# Patient Record
Sex: Female | Born: 1935 | Race: White | Hispanic: No | Smoking: Never smoker
Health system: Southern US, Community
[De-identification: ages and names within clinical notes are randomized; demographics above are authoritative.]

## PROBLEM LIST (undated history)

## (undated) DIAGNOSIS — I493 Ventricular premature depolarization: Secondary | ICD-10-CM

## (undated) DIAGNOSIS — C801 Malignant (primary) neoplasm, unspecified: Secondary | ICD-10-CM

## (undated) DIAGNOSIS — M069 Rheumatoid arthritis, unspecified: Secondary | ICD-10-CM

## (undated) DIAGNOSIS — E039 Hypothyroidism, unspecified: Secondary | ICD-10-CM

## (undated) DIAGNOSIS — I10 Essential (primary) hypertension: Secondary | ICD-10-CM

## (undated) DIAGNOSIS — J45909 Unspecified asthma, uncomplicated: Secondary | ICD-10-CM

## (undated) DIAGNOSIS — I119 Hypertensive heart disease without heart failure: Secondary | ICD-10-CM

## (undated) DIAGNOSIS — M109 Gout, unspecified: Secondary | ICD-10-CM

## (undated) DIAGNOSIS — E785 Hyperlipidemia, unspecified: Secondary | ICD-10-CM

## (undated) DIAGNOSIS — I359 Nonrheumatic aortic valve disorder, unspecified: Secondary | ICD-10-CM

## (undated) DIAGNOSIS — I480 Paroxysmal atrial fibrillation: Secondary | ICD-10-CM

## (undated) HISTORY — DX: Hyperlipidemia, unspecified: E78.5

## (undated) HISTORY — DX: Ventricular premature depolarization: I49.3

## (undated) HISTORY — DX: Unspecified asthma, uncomplicated: J45.909

## (undated) HISTORY — DX: Hypertensive heart disease without heart failure: I11.9

## (undated) HISTORY — PX: CATARACT EXTRACTION: SUR2

## (undated) HISTORY — DX: Gout, unspecified: M10.9

## (undated) HISTORY — DX: Hypothyroidism, unspecified: E03.9

## (undated) HISTORY — DX: Paroxysmal atrial fibrillation: I48.0

## (undated) HISTORY — DX: Nonrheumatic aortic valve disorder, unspecified: I35.9

## (undated) HISTORY — DX: Malignant (primary) neoplasm, unspecified: C80.1

## (undated) HISTORY — DX: Rheumatoid arthritis, unspecified: M06.9

## (undated) HISTORY — DX: Essential (primary) hypertension: I10

---

## 1984-01-04 HISTORY — PX: BREAST BIOPSY: SHX20

## 1986-06-17 HISTORY — PX: HYSTEROSCOPY: SHX211

## 1988-01-04 HISTORY — PX: BONY PELVIS SURGERY: SHX572

## 1990-06-04 HISTORY — PX: ELBOW SURGERY: SHX618

## 1998-02-05 ENCOUNTER — Other Ambulatory Visit: Admission: RE | Admit: 1998-02-05 | Discharge: 1998-02-05 | Payer: Self-pay | Admitting: Family Medicine

## 1999-02-15 ENCOUNTER — Other Ambulatory Visit: Admission: RE | Admit: 1999-02-15 | Discharge: 1999-02-15 | Payer: Self-pay | Admitting: Family Medicine

## 2000-11-15 HISTORY — PX: CATARACT EXTRACTION: SUR2

## 2001-03-12 ENCOUNTER — Other Ambulatory Visit: Admission: RE | Admit: 2001-03-12 | Discharge: 2001-03-12 | Payer: Self-pay | Admitting: Family Medicine

## 2003-05-20 ENCOUNTER — Emergency Department (HOSPITAL_COMMUNITY): Admission: EM | Admit: 2003-05-20 | Discharge: 2003-05-21 | Payer: Self-pay | Admitting: *Deleted

## 2003-06-30 ENCOUNTER — Inpatient Hospital Stay (HOSPITAL_COMMUNITY): Admission: RE | Admit: 2003-06-30 | Discharge: 2003-07-04 | Payer: Self-pay | Admitting: Orthopedic Surgery

## 2003-06-30 HISTORY — PX: HIP SURGERY: SHX245

## 2003-07-02 ENCOUNTER — Encounter (INDEPENDENT_AMBULATORY_CARE_PROVIDER_SITE_OTHER): Payer: Self-pay | Admitting: Cardiology

## 2003-07-04 ENCOUNTER — Inpatient Hospital Stay
Admission: RE | Admit: 2003-07-04 | Discharge: 2003-07-14 | Payer: Self-pay | Admitting: Physical Medicine & Rehabilitation

## 2004-04-20 ENCOUNTER — Encounter: Admission: RE | Admit: 2004-04-20 | Discharge: 2004-04-20 | Payer: Self-pay | Admitting: Family Medicine

## 2004-04-27 ENCOUNTER — Ambulatory Visit: Payer: Self-pay | Admitting: Critical Care Medicine

## 2004-06-08 ENCOUNTER — Encounter: Admission: RE | Admit: 2004-06-08 | Discharge: 2004-06-08 | Payer: Self-pay | Admitting: Family Medicine

## 2004-06-10 ENCOUNTER — Other Ambulatory Visit: Admission: RE | Admit: 2004-06-10 | Discharge: 2004-06-10 | Payer: Self-pay | Admitting: Family Medicine

## 2004-12-09 ENCOUNTER — Encounter: Admission: RE | Admit: 2004-12-09 | Discharge: 2004-12-09 | Payer: Self-pay | Admitting: Family Medicine

## 2005-06-28 ENCOUNTER — Encounter: Admission: RE | Admit: 2005-06-28 | Discharge: 2005-06-28 | Payer: Self-pay | Admitting: Family Medicine

## 2005-09-01 ENCOUNTER — Encounter: Admission: RE | Admit: 2005-09-01 | Discharge: 2005-09-01 | Payer: Self-pay | Admitting: Orthopedic Surgery

## 2005-09-27 ENCOUNTER — Encounter: Admission: RE | Admit: 2005-09-27 | Discharge: 2005-09-27 | Payer: Self-pay | Admitting: Orthopedic Surgery

## 2005-10-20 ENCOUNTER — Encounter: Admission: RE | Admit: 2005-10-20 | Discharge: 2005-10-20 | Payer: Self-pay | Admitting: Orthopedic Surgery

## 2007-01-04 HISTORY — PX: CARDIAC SURGERY: SHX584

## 2009-05-28 ENCOUNTER — Inpatient Hospital Stay (HOSPITAL_COMMUNITY): Admission: RE | Admit: 2009-05-28 | Discharge: 2009-05-31 | Payer: Self-pay | Admitting: Orthopedic Surgery

## 2009-05-28 HISTORY — PX: HIP SURGERY: SHX245

## 2010-03-22 LAB — URINALYSIS, ROUTINE W REFLEX MICROSCOPIC
Bilirubin Urine: NEGATIVE
Glucose, UA: NEGATIVE mg/dL
Ketones, ur: NEGATIVE mg/dL
Leukocytes, UA: NEGATIVE
Nitrite: NEGATIVE
Protein, ur: NEGATIVE mg/dL
Specific Gravity, Urine: 1.011 (ref 1.005–1.030)
Urobilinogen, UA: 1 mg/dL (ref 0.0–1.0)
pH: 6 (ref 5.0–8.0)

## 2010-03-22 LAB — URINE MICROSCOPIC-ADD ON

## 2010-03-22 LAB — CBC
HCT: 24.8 % — ABNORMAL LOW (ref 36.0–46.0)
HCT: 28.2 % — ABNORMAL LOW (ref 36.0–46.0)
HCT: 29.1 % — ABNORMAL LOW (ref 36.0–46.0)
HCT: 39 % (ref 36.0–46.0)
Hemoglobin: 13.1 g/dL (ref 12.0–15.0)
Hemoglobin: 8.4 g/dL — ABNORMAL LOW (ref 12.0–15.0)
Hemoglobin: 9.4 g/dL — ABNORMAL LOW (ref 12.0–15.0)
Hemoglobin: 9.7 g/dL — ABNORMAL LOW (ref 12.0–15.0)
MCHC: 33.4 g/dL (ref 30.0–36.0)
MCHC: 33.5 g/dL (ref 30.0–36.0)
MCHC: 33.6 g/dL (ref 30.0–36.0)
MCHC: 33.7 g/dL (ref 30.0–36.0)
MCV: 93.9 fL (ref 78.0–100.0)
MCV: 94.5 fL (ref 78.0–100.0)
MCV: 96.1 fL (ref 78.0–100.0)
MCV: 96.3 fL (ref 78.0–100.0)
Platelets: 164 10*3/uL (ref 150–400)
Platelets: 172 10*3/uL (ref 150–400)
Platelets: 194 10*3/uL (ref 150–400)
Platelets: 284 10*3/uL (ref 150–400)
RBC: 2.58 MIL/uL — ABNORMAL LOW (ref 3.87–5.11)
RBC: 3 MIL/uL — ABNORMAL LOW (ref 3.87–5.11)
RBC: 3.08 MIL/uL — ABNORMAL LOW (ref 3.87–5.11)
RBC: 4.06 MIL/uL (ref 3.87–5.11)
RDW: 14.1 % (ref 11.5–15.5)
RDW: 14.2 % (ref 11.5–15.5)
RDW: 14.9 % (ref 11.5–15.5)
RDW: 15.2 % (ref 11.5–15.5)
WBC: 5.9 10*3/uL (ref 4.0–10.5)
WBC: 6.1 10*3/uL (ref 4.0–10.5)
WBC: 7.7 10*3/uL (ref 4.0–10.5)
WBC: 8.3 10*3/uL (ref 4.0–10.5)

## 2010-03-22 LAB — COMPREHENSIVE METABOLIC PANEL
ALT: 20 U/L (ref 0–35)
AST: 26 U/L (ref 0–37)
Albumin: 4 g/dL (ref 3.5–5.2)
Alkaline Phosphatase: 48 U/L (ref 39–117)
BUN: 12 mg/dL (ref 6–23)
CO2: 28 mEq/L (ref 19–32)
Calcium: 9.9 mg/dL (ref 8.4–10.5)
Chloride: 101 mEq/L (ref 96–112)
Creatinine, Ser: 1.08 mg/dL (ref 0.4–1.2)
GFR calc Af Amer: 59 mL/min — ABNORMAL LOW (ref >60)
GFR calc non Af Amer: 49 mL/min — ABNORMAL LOW (ref >60)
Glucose, Bld: 106 mg/dL — ABNORMAL HIGH (ref 70–99)
Potassium: 4.2 mEq/L (ref 3.5–5.1)
Sodium: 136 mEq/L (ref 135–145)
Total Bilirubin: 0.5 mg/dL (ref 0.3–1.2)
Total Protein: 6.9 g/dL (ref 6.0–8.3)

## 2010-03-22 LAB — BASIC METABOLIC PANEL
BUN: 12 mg/dL (ref 6–23)
BUN: 13 mg/dL (ref 6–23)
CO2: 26 mEq/L (ref 19–32)
CO2: 29 mEq/L (ref 19–32)
Calcium: 8.3 mg/dL — ABNORMAL LOW (ref 8.4–10.5)
Calcium: 8.5 mg/dL (ref 8.4–10.5)
Chloride: 102 mEq/L (ref 96–112)
Chloride: 103 mEq/L (ref 96–112)
Creatinine, Ser: 0.75 mg/dL (ref 0.4–1.2)
Creatinine, Ser: 0.81 mg/dL (ref 0.4–1.2)
GFR calc Af Amer: >60 mL/min (ref >60)
GFR calc Af Amer: >60 mL/min (ref >60)
GFR calc non Af Amer: >60 mL/min (ref >60)
GFR calc non Af Amer: >60 mL/min (ref >60)
Glucose, Bld: 122 mg/dL — ABNORMAL HIGH (ref 70–99)
Glucose, Bld: 142 mg/dL — ABNORMAL HIGH (ref 70–99)
Potassium: 3.5 mEq/L (ref 3.5–5.1)
Potassium: 4.2 mEq/L (ref 3.5–5.1)
Sodium: 134 mEq/L — ABNORMAL LOW (ref 135–145)
Sodium: 135 mEq/L (ref 135–145)

## 2010-03-22 LAB — CROSSMATCH
ABO/RH(D): A POS
Antibody Screen: NEGATIVE

## 2010-03-22 LAB — DIFFERENTIAL
Basophils Absolute: 0 10*3/uL (ref 0.0–0.1)
Basophils Relative: 1 % (ref 0–1)
Eosinophils Absolute: 0.1 10*3/uL (ref 0.0–0.7)
Eosinophils Relative: 2 % (ref 0–5)
Lymphocytes Relative: 35 % (ref 12–46)
Lymphs Abs: 2.1 10*3/uL (ref 0.7–4.0)
Monocytes Absolute: 0.4 10*3/uL (ref 0.1–1.0)
Monocytes Relative: 6 % (ref 3–12)
Neutro Abs: 3.4 10*3/uL (ref 1.7–7.7)
Neutrophils Relative %: 56 % (ref 43–77)

## 2010-03-22 LAB — PROTIME-INR
INR: 0.97 (ref 0.00–1.49)
Prothrombin Time: 12.8 seconds (ref 11.6–15.2)

## 2010-03-22 LAB — APTT: aPTT: 29 seconds (ref 24–37)

## 2010-03-22 LAB — PREPARE RBC (CROSSMATCH)

## 2010-03-22 LAB — ABO/RH: ABO/RH(D): A POS

## 2010-05-21 NOTE — Consult Note (Signed)
NAME:  Jessica Kaufman, Jessica Kaufman NO.:  [id number]   MEDICAL RECORD NO.:  [id number]                   PATIENT TYPE:  INP   LOCATION:  6705                                 FACILITY:  [facility]   PHYSICIAN:  W. Ashley Royalty., M.D.         DATE OF BIRTH:  1925/12/11   DATE OF CONSULTATION:  07/02/2003  DATE OF DISCHARGE:                                   CONSULTATION   REASON FOR CONSULTATION:  Thank you for asking me to see this 77 year old  female for evaluation of a cardiac murmur.  She has a previous history of  hypertension and some mild edema and has been on Toprol-XL and  hydrochlorothiazide previously.  She had atypical chest pain and had a  Cardiolite stress test done in July 2002 that showed no ischemia but had  some atrial fibrillation with exercise.  She competes in the [employer] and has no angina, PND, orthopnea, syncope, claudication.  She does  have some mild palpitations.  She was admitted for a hip replacement.  A  cardiac murmur was noted by the physician's assistant and I was asked to  evaluate this.  She has no history of rheumatic fever or syncope.   PAST MEDICAL HISTORY:  Remarkable for:  1. Hypertension.  2. Gastroesophageal reflux.  3. History of phlebitis.   PAST SURGICAL HISTORY:  1. Bladder suspension and vaginal hysterectomy.  2. Bilateral cataract extraction.  3. Right elbow dislocation, closure and reduction of foreign bodies in 1992.  4. Hysteroscopy in 1988.  5. Left breast biopsy in 1986.  6. Hip replacement.   SOCIAL HISTORY:  She is currently a widow and has seven children.  Six are  currently living.  She lives alone, is a non-smoker, does not use alcohol to  excess.   FAMILY HISTORY:  Mother died at age 69 with osteoarthritis, coronary artery  disease, and hypertension.  Father died at age 7 with an MI.  A brother  died in infancy.  Two brothers, 5 and 62.  Two sisters, 88 and 66.   REVIEW OF SYSTEMS:   She has had some crown work and wears glasses for  reading.  She has occasional tinnitus.  She has complained of a skin rash  thought due to Sulindac. She has a history of reflux and also has some  stress urgency and some mild incontinence.   PHYSICAL EXAMINATION:  GENERAL:  She is a pleasant female who is mildly  overweight.  No acute distress.  VITAL SIGNS:  Blood pressure currently 150/80, pulse 76.  SKIN:  Warm and dry.  HEENT:  __________ clear.  NECK:  Supple without masses.  No JVD, no thyromegaly or bruits.  LUNGS:  Clear.  CARDIAC:  Normal S1 and S2.  There is a 2/6 systolic murmur at the right  sternal border at the second intercostal space, radiating down the left  sternal border, slight radiation into the carotids.  ABDOMEN:  Soft and nontender.  Femoral and distal pulses are 2+.  There was  no edema noted.   LABORATORY DATA:  A 12-lead EKG shows some nonspecific changes in the  lateral leads, interventricular conduction delay. Hemoglobin 13.3,  hematocrit 39 on admission. Hemoglobin yesterday was 8.2 and hematocrit of  23.7 prior to transfusion.  Slight elevation of serum glucose.  Liver  enzymes were normal.  Echocardiogram was reviewed and was technically very  difficult.  There appeared to be slight thickening of the aortic valve with  very mild aortic sclerosis or stenosis.  The left ventricle was not well  seen.  There may have been slight increase in septal thickness.  There may  have been a gradient across the outflow tract but this was very difficult to  determine off the echo and I do not trust the Doppler measurements done by  the technician that did it.   IMPRESSION:  1. Cardiac murmur which is likely due to aortic valve disease which is     asymptomatic.  2. History of hypertension.  3. History of atrial fibrillation which was exercise induced.   RECOMMENDATIONS:  1. Use SBE prophylaxis for dental work, GI or GU procedures.  2. Beta blocker for treatment  of atrial fibrillation which is exercise     induced.  She should continue on this.  3. No further work-up other than getting periodic echos maybe every two to     three years as needed for the cardiac murmur.  She may involved in     unlimited activity.   Thank you for asking me to see this nice woman with you.                                               Darden Palmer., M.D.    WST/MEDQ  D:  07/02/2003  T:  07/02/2003  Job:  [id number]   cc:   Lianne Bushy, M.D.  [address]  [place]  [place] [address]  Fax: [phone]   Carlisle Beers. Rendall III, M.D.  [address] [place]  [place] [address]  Fax: [phone]

## 2010-05-21 NOTE — Discharge Summary (Signed)
NAME:  Jessica Kaufman, Jessica Kaufman                      ACCOUNT NO.:  [id number]   MEDICAL RECORD NO.:  [id number]                   Kaufman TYPE:  ORB   LOCATION:  4531                                 FACILITY:  [facility]   PHYSICIAN:  Ellwood Dense, M.D.                DATE OF BIRTH:  August 23, 1925   DATE OF ADMISSION:  07/04/2003  DATE OF DISCHARGE:  07/14/2003                                 DISCHARGE SUMMARY   DISCHARGE DIAGNOSES:  1. Right total hip replacement.  2. Postoperative anemia.  3. Hypertension.  4. Upper respiratory infection.   HISTORY OF PRESENT ILLNESS:  Jessica Kaufman is a 77 year old female with  problems with ambulation and pain radiating to Jessica knee.  Jessica Kaufman elected to  undergo right total hip replacement on June 27, by Dr. Priscille Kluver.  Postoperatively, Jessica Kaufman is weightbearing as tolerated and on Arixtra for DVT  prophylaxis.  Jessica Kaufman was noted to have a heart murmur noted on postop check.  It was felt Jessica Kaufman suffered from aortic valve disease.  Jessica Kaufman was to  continue on Toprol.  Jessica Kaufman has received two units of packed red blood  cells secondary to drop in H&H at 10.6 and 21.9.  Therapies were initiated  and Jessica Kaufman had been progressing along well.  Jessica Kaufman is currently close  supervision for transfers, close supervision for ambulating 50 feet with  rolling walker, moderate assist for dressing, and total assist for low body  dressing.   PAST MEDICAL HISTORY:  1. GERD.  2. Hysterectomy.  3. ORIF.  4. Occasional palpitations.  5. Atrial fibrillation, exercised induced.  6. Cardiac murmur.   ALLERGIES:  FLAGYL, NAPROSYN and SULINDAC.   SOCIAL HISTORY:  Jessica Kaufman lives alone in a split-level home.  No steps at  entry.  There are 8-9 steps inside.  Jessica Kaufman was independent prior to admission.  Jessica Kaufman does not use any tobacco or alcohol.   HOSPITAL COURSE:  Jessica Kaufman was admitted to [facility] on July 04, 2003, for  therapy to consider PT/OT daily.  Past admission, Jessica Kaufman was maintained  on iron  supplement for her postoperative anemia.  Recheck CBC showed hemoglobin  10.1, hematocrit 28.8, white count 6.2, platelets 99.  Check of electrolytes  showed sodium 132, potassium 3.8, chloride 100, CO2 79, creatinine 0.7,  glucose 97.  Jessica Kaufman's hip incision was noted to be healing well without  any signs or symptoms of infection or hematuria noted.  Arixtra was  continued for 9 days of total therapy.  Jessica Kaufman did have some dysuria with  episode of hematuria and UA C&S was sent off showing positive nitrites, 21-  50 wbc's and too numerous to count bacteria.  Urine culture grew out E. coli  that was pansensitive.  Jessica Kaufman was started on amoxicillin to continue  this for seven total days of antibiotic therapy.  Jessica Kaufman's blood  pressure has been well-controlled during this stay.  Jessica Kaufman  has been  participating well with therapies.  During her stay in [facility], Jessica Kaufman  progressed to being at assist for ADLs, modified independent for toileting,  modified independent for transfers, modified independent for ambulating 200  feet with rolling walker, requires minimal assist to navigate stairs.  Further followup therapies to include home health PT/OT by [facility].  On July 14, 2003, Jessica Kaufman is discharged to home.   DISCHARGE MEDICATIONS:  1. Trinsicon one p.o. b.i.d.  2. Amoxicillin 250 mg b.i.d.  3. Vicodin 1-2 q.4-6h. p.r.n. pain.  4. Os-Cal plus.  5. Protonix 40 mg a day.  6. Hydrochlorothiazide 25 mg a day.  7. Toprol XL 25 mg a day.  8. Senokot 50 mg p.o. b.i.d.   ACTIVITY:  Use walker.  Follow right total hip precautions.  No driving.   WOUND CARE:  Keep area clean and dry.  May wash with soap and water.   FOLLOW UP:  Jessica Kaufman is to follow up with Dr. Priscille Kluver for an appointment.  Follow up with Dr. Thomasena Edis as needed.      Greg Cutter, P.A.                    Ellwood Dense, M.D.    PP/MEDQ  D:  07/16/2003  T:  07/17/2003  Job:   [id number]   cc:   Lianne Bushy, M.D.  [address]  [place]  [place] [address]  Fax: [phone]   Carlisle Beers. Rendall III, M.D.  [address] [place]  [place] [address]  Fax: [phone]

## 2010-05-21 NOTE — H&P (Signed)
NAME:  Jessica Kaufman, Jessica Kaufman                      ACCOUNT NO.:  [id number]   MEDICAL RECORD NO.:  [id number]                   PATIENT TYPE:  INP   LOCATION:                                       FACILITY:  [facility]   PHYSICIAN:  John L. Rendall, M.D.               DATE OF BIRTH:  May 14, 1925   DATE OF ADMISSION:  06/30/2003  DATE OF DISCHARGE:                                HISTORY & PHYSICAL   CHIEF COMPLAINT:  Right hip pain for the last year and a half to two years.   HISTORY OF PRESENT ILLNESS:  This 77 year old white female patient presented  to Dr. Priscille Kluver with a one and a half to two year history of gradual onset  and pretty constant right hip pain.  She has had no known injury or prior  surgery to the hip, and the pain is about the same as it was when it  started.  The pain is intermittent, just kind of an uncomfortable sensation  in the right groin and buttock area with some radiation now into her back  and occasionally down into her knee.  She is having greater difficulty with  ambulation and range of motion, and the pain increases with any walking and  decreases with rest.  There is no night pain or paresthesias associated with  the pain, but the hip does feel like it catches at times.  There is no  popping, grinding, locking, or giving way.  She does have difficulty putting  her socks and shoes on.  She is currently taking Arthritis Tylenol for pain  and that provides a moderate amount of relief.  She does ambulate with the  use of a cane as needed.   ALLERGIES:  1. METRONIDAZOLE/FLAGYL - a rash.  2. NAPROSYN - nausea, and vomiting, and sick feeling.  3. SULINDAC - rash.   CURRENT MEDICATIONS:  1. Toprol 50 mg one tablet p.o. q.a.m.  2. Hydrochlorothiazide 25 mg half a tablet p.o. q.a.m.  3. Detrol LA 4 mg one tablet p.o. q.a.m.  4. Aciphex 20 mg one tablet p.o. every day p.r.n. reflux.  5. Allegra 180 mg one tablet p.o. q.a.m.  6. Metamucil 1 teaspoon p.o. q.p.m.  7.  Calcium plus vitamin D 500 mg one tablet p.o. t.i.d.  8. Centrum Silver one tablet p.o. q.a.m.  9. Chondroitin and glucosamine 400/500 mg one tablet p.o. b.i.d.  10.      Tylenol Arthritis two tablets p.o. t.i.d. p.r.n. pain.   PAST MEDICAL HISTORY:  1. Hypertension, diagnosed approximately a year ago.  She had been on Toprol     and hydrochlorothiazide but quit taking after urinary tract infection in     May when her blood pressure was low.  She says she has been monitoring     it, and it has been fairly low with the highest reading that she     remembers right now  146/70 something.  2. Occasional reflux treated with Aciphex.  3. History of cardiac stress test about a year ago, and she said she had an     occasional skipped beat but it was otherwise negative.  4. History of phlebitis after a pregnancy.  She denies any history of diabetes mellitus, thyroid disease, hiatal hernia,  peptic ulcer disease, heart disease, asthma, or any other chronic medical  condition other than noted previously.   PAST SURGICAL HISTORY:  1. Left breast biopsy, by Dr. Currie Paris, in 1986.  2. Hysteroscopy, by Dr. Leatha Gilding. Mezer, in 1988.  3. Right elbow dislocation, closed reduction and removal of loose bodies, by     Dr. Jonny Ruiz L. Rendall, 1992.  4. Repair of bladder and a vaginal hysterectomy, by Dr. Chevis Pretty, in 1997.  5. Removal bilateral cataracts, by Dr. Carin Hock. Epes, in 2002.  She denies any complications from the above mentioned procedures.   SOCIAL HISTORY:  She denies any history of cigarette smoking, alcohol use,  or drug use.  She is a widow and had seven children.  She has six living at  this time.  She did have two miscarriages.  She lives by herself in a split-  level house with no steps into the main entrance.  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   Her medical doctor is Dr. Lianne Bushy in [place], [place], and his  phone number is [phone].    FAMILY HISTORY:  Mother died at the age of 11 with osteoarthritis, coronary  artery disease, what sounds like an epidural hematoma, and hypertension.  Her father died at the age of 69 with a myocardial infarction.  She had one  brother who died at one day old, and then she has two living brothers ages  72 and 10, and two living sisters ages 48 and 29.  Her brothers are all  healthy.  Her older sister has a history of melanoma.  Younger sister a  history of coronary artery disease.  She did have three daughters and four  sons, and they range in age from age 25-55.  She did have one son who died  at age 35 with a tractor accident.   REVIEW OF SYSTEMS:  She does wear glasses mainly for reading.  She is to  have a crown finished on Thursday, it broke last week.  She does have  occasional ringing in her ears.  For the last three weeks, she has had a  problem with either a cough or allergies where she has a minimally  productive cough and some drainage.  She did have a very sore mouth about 2-  3 weeks ago and that is now resolved.  She did have a urinary tract  infection, May 20, 2003, which was treated with Cipro.  She does have a  Living Will and  her power-of-attorney is Juliette Alcide and Tonette Bihari.  All other systems are negative and noncontributory.   PHYSICAL EXAMINATION:  GENERAL:  A well-developed, well-nourished, mildly  overweight, white female, no acute distress.  Mood and affect are  appropriate.  Talks easily with examiner.  Does walk with a significant limp  and an antalgic gait with the use of a cane.  Height 5' 6, weight 180  pounds, BMI is 28.  VITAL SIGNS:  Temp 96.8 degrees Fahrenheit, pulse 68, respirations 14, and  BP 198/92.  HEENT:  Normocephalic, atraumatic without frontal or maxillary sinus  tenderness  to palpation.  Conjunctivae pink.  Sclerae anicteric.  PERRLA.  EOMs intact.  No visible external ear deformities.  Hearing grossly intact. Tympanic membranes pearly  gray bilaterally with good light reflex, except  unable to visualize the left ear tympanic membrane due to a large amount of  cerumen in the ear canal.  Nose and nasal septum midline.  Nasal mucosa pink  and moist without exudates or polyps noted.  Buccal mucosa pink and moist.  Good dentition.  Pharynx without erythema or exudates.  Tongue and the uvula  midline.  Tongue without fasciculations and the uvula rises equally with  phonation.  NECK:  No visible masses or lesions noted.  Trachea midline.  No palpable  lymphadenopathy, nor thyromegaly.  Carotids +2 bilaterally without bruits.  Full range of motion and nontender to palpation along the cervical spine.  CARDIOVASCULAR:  Heart rate and rhythm regular.  S1 S2 present with a grade  2/6 systolic murmur heard best at the right sternal border second  intercostal space.  No rubs, clicks, or gallops noted.  RESPIRATORY:  Respirations even and unlabored.  Breath sounds clear to  auscultation bilaterally without rales or wheezes noted.  ABDOMEN:  Rounded abdominal contour.  Bowel sounds present x 4 quadrants.  Soft, nontender to palpation without hepatosplenomegaly, nor CVA tenderness.  Unable to palpate femoral pulses at this time.  Nontender to palpation along  the entire length of the vertebral column.  BREAST/GU/RECTAL/PELVIC:  These exams deferred at this time.  MUSCULOSKELETAL:  No obvious deformities bilateral upper extremities with  full range of motion of these extremities without pain.  Radial pulses +2  bilaterally.  She has full range of motion of her knees, ankles, and toes  bilaterally.  DP and PT pulses are +2.  No lower extremity edema.  Full  range of motion of her knees without pain or crepitus.  Left hip has full  extension and flexion about 100 degrees.  She does have decreased internal  rotation of the left hip but external rotation appears to be fairly full.  No real pain with range of motion of that left hip and no  pain with  palpation about the left hip.  Right hip has full extension and flexion to  90 degrees and absolutely no internal/external rotation.  Any attempts at  these movements do cause pain.  No pain with palpation about the hip at this  time.  NEUROLOGIC:  Alert and oriented  x 3.  Cranial nerves II-XII are grossly  intact.  Strength 5/5 bilateral upper and lower extremities.  Rapid  alternating movements intact.  Deep tendon reflexes 2+ bilateral upper and  lower extremities.  Sensation intact to light touch.   RADIOLOGIC FINDINGS:  X-rays taken, in May 2005, of both hips show end stage  osteoarthritis on the right hip with total bone against bone contact.  The  left side, there appears to be almost bone against bone contact and end  stage osteoarthritis.   IMPRESSION:  1. End stage osteoarthritis, bilateral hips, right worse than left.  2. Hypertension.  3. Mild gastroesophageal reflux disease.  4. History of phlebitis. 5. History of urinary tract infection, May 2005.   PLAN:  Ms. Fenter will be admitted to [facility], on June 30, 2003, where she will undergo a right total hip arthroplasty by Dr. Jonny Ruiz L.  Rendall.  She will undergo all the routine preoperative laboratory tests and  studies prior to this procedure.  If we  have any medical issues while she is  hospitalized, we will consult one of the hospitalists.  She will notify Dr.  Purnell Shoemaker today of her elevated blood pressure, and I have encouraged her to  resume her hydrochlorothiazide and Toprol which she had stopped taking after  the urinary tract infection.  She will resume this and notify him.      Legrand Pitts Duffy, P.A.                      John L. Priscille Kluver, M.D.    KED/MEDQ  D:  06/24/2003  T:  06/25/2003  Job:  [id number]

## 2010-05-21 NOTE — Op Note (Signed)
NAME:  Jessica Kaufman, Jessica Kaufman                      ACCOUNT NO.:  [id number]   MEDICAL RECORD NO.:  [id number]                   PATIENT TYPE:  INP   LOCATION:  2872                                 FACILITY:  [facility]   PHYSICIAN:  John L. Rendall III, M.D.           DATE OF BIRTH:  07/12/1925   DATE OF PROCEDURE:  06/30/2003  DATE OF DISCHARGE:                                 OPERATIVE REPORT   PREOPERATIVE DIAGNOSIS:  Osteoarthritis, right hip.   SURGICAL PROCEDURE:  Right AML total hip replacement.   POSTOPERATIVE DIAGNOSIS:  Osteoarthritis, right hip.   SURGEON:  John L. Rendall, M.D.   ASSISTANT:  Richardean Canal, P.A.   ANESTHESIA:  General.   PATHOLOGY:  The patient has bone-on-bone right hip and is resistant to  conservative measures of controlling pain.   JUSTIFICATION FOR INPATIENT SETTING:  Observation and pain relief and  therapy.   PROCEDURE:  Under general anesthesia, the patient is placed in the left  lateral decubitus position and the right leg is prepared with Duraprep and  draped as a sterile field.  A posterior approach is made with relatively  small incision, approximately six inches.  Dissection is carried through  skin and subcutaneous tissue.  The IT band is split in the line of its  fibers.  Small Charnley retractor is inserted.  Short external rotators and  hip capsule are taken down.  The hip capsule is opened in a T-shaped manner,  multiple small vessels are cauterized.  The hip is dislocated.  The superior  femoral neck is exposed.  IM initiator and canal finder are used.  Progressive reaming of the canal is then done to 16 mm, which gives good  chatter fit.  Femoral neck is then osteotomized.  The canal is then  appropriately rasped, a 13.5 small, 13.5 standard, 15 standard, and 16.5  standard.  Excellent canal fill and fill of the proximal triangle is  obtained.  Attention is then turned to the acetabulum.  There is some  calcification along the  acetabular rim.  Two cobras are inserted inferiorly,  two wing retractors superiorly.  Care is taken in insertion of these to  manually move soft tissues, including the sciatic nerve, out of the way.  Once this is completed and acetabular exposure is excellent, the ligamentum  teres is removed, the labrum is excised.  The acetabulum is then selectively  reamed up to 59 mm.  A 60 100 Series cup is then inserted.  A trial poly for  a 36 mm hip ball is then inserted.  Excellent fit, alignment, and stability  are obtained, trialling off a rasp.  The best neck is the high-offset as she  is a fairly large woman.  Once this is done, permanent components are  obtained, an apex hole eliminator is inserted.  The Pinnacle Marathon liner  is inserted, a 36 mm inside diameter, 60 mm outside diameter, a +4 with 10  degrees off set.  The high-offset stem 16.5 large is then inserted and the  36 mm femoral head, neck length +1.5.  Excellent fit, alignment, and  stability were obtained through normal range of motion.  Only with marked  internal rotation would this hip dislocate.  The hip capsule is then  repaired with #1 Tycron.  The short external rotators do not really hold a  stitch very well and are left.  The IT band is then closed with #1 Tycron,  subcu with #1 and 2-0 Vicryl, and the skin with clips.  Operative time  approximately 1 hour.  Blood loss less than 300 mL.  Patient tolerated the  procedure well and returned to recovery in good condition.                                               John L. Dorothyann Gibbs, M.D.    Renato Gails  D:  06/30/2003  T:  06/30/2003  Job:  [id number]

## 2010-05-21 NOTE — Discharge Summary (Signed)
NAME:  Jessica Kaufman, Jessica Kaufman                      ACCOUNT NO.:  [id number]   MEDICAL RECORD NO.:  [id number]                   PATIENT TYPE:  INP   LOCATION:  6705                                 FACILITY:  [facility]   PHYSICIAN:  Legrand Pitts. Duffy, P.A.                DATE OF BIRTH:  03-Dec-1925   DATE OF ADMISSION:  06/30/2003  DATE OF DISCHARGE:  07/04/2003                                 DISCHARGE SUMMARY   ADMISSION DIAGNOSIS:  1. End stage osteoarthritis bilateral hips, right worse than left.  2. Hypertension.  3. Mild gastroesophageal reflux disease.  4. History of phlebitis.  5. History of urinary tract infection in May 2005.   DISCHARGE DIAGNOSIS:  1. End stage osteoarthritis bilateral hips status post right total hip     arthroplasty.  2. Acute blood loss anemia secondary to surgery.  3. Aortic valve disease.  4. Hypertension.  5. Mild gastroesophageal reflux disease.  6. History of phlebitis.  7. History of urinary tract infection.   SURGICAL PROCEDURE:  On June 30, 2003, Jessica Kaufman underwent a right total  hip arthroplasty by Dr. Jonny Ruiz L. Rendall assisted by Richardean Canal, P.A.-C.  She had a Pinnacle 100 series cup size 60 mm with a Pinnacle Marathon  acetabular lining, +4 10 degree 60 mm outer diameter, 36 mm inner diameter,  an apex hole eliminator, and then an AML large stature high offset 160 mm  length 16.5 size with 52 mm offset femoral stem placed with an articulating  metal on metal femoral head, 36 mm, +1.5 neck, and 12 14 cone.   COMPLICATIONS:  None.   CONSULTATION:  1. Case management, physical therapy consult, rehab medicine consult, July 01, 2003.  2. Cardiology consult by Dr. Viann Fish July 02, 2003.  3. Occupational therapy consult July 03, 2003.   HISTORY OF PRESENT ILLNESS:  This 77 year old white female patient presented  to Dr. Priscille Kluver with a 1 1/2 to 2 year history of gradual onset and constant  right hip pain.  No known injury of the hip  and the pain is the same as when  it started.  It is intermittent, uncomfortable situation in the right groin  and buttock, with radiation into her back and into her knee.  She has  increased difficulty with ambulation and range of motion and arthritis  Tylenol provides some relief of her pain.  She has failed conservative  treatment and because of that, she is presenting for a right hip  replacement.   HOSPITAL COURSE:  Jessica Kaufman tolerated her surgical procedure well without  immediate postoperative complications.  She was subsequently transferred to  5000.  On postop day one, T-max 99.5 with vital signs stable.  Hemoglobin  8.3, hematocrit 23.7.  Right leg was neurovascularly intact and dressing  intact without drainage.  She was asymptomatic at 8.3 and blood count was  monitored.  She was started  on therapy per protocol.   On postoperative day two, hemoglobin dropped to 7.6 with hematocrit 21.9.  She was noted to have a grade 3/6 systolic murmur throughout.  Cardiology  consult was ordered and she was transfused with 2 units of packed red blood  cells.  She was continued on therapy.  Cardiology saw her and did do a 2-D  echocardiogram.  They felt she had aortic valve disease which was  asymptomatic and recommended SBE prophylaxis for dental work, GI, or GU  procedures.  They also recommended to continue her cardiac workup and felt  no further workup was needed other than periodic echoes every 2-3 years.   Postop day three, she was feeling better.  Hemoglobin had improved to 8.8  with hematocrit 25.3.  She was still continued on therapy.  On July 1, she  remained afebrile.  A bed became available on the subacute unit and she was  subsequently transferred there.   DISCHARGE INSTRUCTIONS:  Diet:  Continue her current hospitalization diet  with adjustments to be made per the rehab physicians.  Medications:  Continue her current hospital meds with adjustments to be made  per the  rehab physicians.  Activity:  She can be out of bed, weight-bearing as tolerated on the right  leg with the use of the walker.  She is to continue PT and OT per rehab  protocols.  Wound care:  Please clean the right hip incision with Betadine daily and  apply a dry dressings.  Staples can be removed with Steri-Strips with  Benzoin applied on postop day 14.  Please notify Dr. Priscille Kluver of temperature  greater than 101.5, chills, pain unrelieved by pain meds, or foul smelling  drainage from the wound.  Follow up:  She needs to follow up with Dr. Priscille Kluver in our office about two  weeks after discharge from the subacute unit.   LABORATORY DATA:  July 01, 2003, hemoglobin 8.3, hematocrit 23.7.  On the  29th, hemoglobin 7.6, hematocrit 21.9.  On June 30, white count 7.8,  hemoglobin 8.8, hematocrit 25.3, and platelets 194.  On June 21, glucose  104.  On June 28, sodium 132, potassium 3.6, glucose 130.  On June 29,  sodium 136, potassium 3.2, glucose 126.  All her laboratory studies were  within normal limits.                                                Legrand Pitts Duffy, P.A.    KED/MEDQ  D:  07/30/2003  T:  07/30/2003  Job:  [id number]   cc:   Lianne Bushy, M.D.  [address]  [place]  [place] [address]  Fax: [phone]   W. Ashley Kaufman., M.D.  [address], [address]  [place]  [place] [address]  Fax: [phone]

## 2010-10-19 ENCOUNTER — Ambulatory Visit (HOSPITAL_BASED_OUTPATIENT_CLINIC_OR_DEPARTMENT_OTHER)
Admission: RE | Admit: 2010-10-19 | Discharge: 2010-10-19 | Disposition: A | Discharge status: Home / Self Care | Payer: Medicare Other | Source: Ambulatory Visit | Attending: Urology | Admitting: Urology

## 2010-10-19 DIAGNOSIS — I10 Essential (primary) hypertension: Secondary | ICD-10-CM | POA: Insufficient documentation

## 2010-10-19 DIAGNOSIS — K219 Gastro-esophageal reflux disease without esophagitis: Secondary | ICD-10-CM | POA: Insufficient documentation

## 2010-10-19 DIAGNOSIS — E039 Hypothyroidism, unspecified: Secondary | ICD-10-CM | POA: Insufficient documentation

## 2010-10-19 DIAGNOSIS — E7800 Pure hypercholesterolemia, unspecified: Secondary | ICD-10-CM | POA: Insufficient documentation

## 2010-10-19 DIAGNOSIS — N393 Stress incontinence (female) (male): Secondary | ICD-10-CM | POA: Insufficient documentation

## 2010-10-19 DIAGNOSIS — Z79899 Other long term (current) drug therapy: Secondary | ICD-10-CM | POA: Insufficient documentation

## 2010-10-19 DIAGNOSIS — Z86718 Personal history of other venous thrombosis and embolism: Secondary | ICD-10-CM | POA: Insufficient documentation

## 2010-10-19 DIAGNOSIS — J45909 Unspecified asthma, uncomplicated: Secondary | ICD-10-CM | POA: Insufficient documentation

## 2010-10-19 DIAGNOSIS — Z7982 Long term (current) use of aspirin: Secondary | ICD-10-CM | POA: Insufficient documentation

## 2010-10-19 HISTORY — PX: BLADDER SURGERY: SHX569

## 2010-10-19 LAB — POCT I-STAT 4, (NA,K, GLUC, HGB,HCT)
Glucose, Bld: 106 mg/dL — ABNORMAL HIGH (ref 70–99)
HCT: 39 % (ref 36.0–46.0)
Hemoglobin: 13.3 g/dL (ref 12.0–15.0)
Potassium: 3.8 mEq/L (ref 3.5–5.1)
Sodium: 139 mEq/L (ref 135–145)

## 2010-10-21 NOTE — Op Note (Signed)
NAME:  Jessica Kaufman, Jessica Kaufman            ACCOUNT NO.:  [id number]  MEDICAL RECORD NO.:  [id number]  LOCATION:                                 FACILITY:  PHYSICIAN:  Excell Seltzer. Annabell Howells, M.D.    DATE OF BIRTH:  05/29/1925  DATE OF PROCEDURE:  10/19/2010 DATE OF DISCHARGE:                              OPERATIVE REPORT   The patient of Dr. Bjorn Pippin.  PROCEDURE:  SPARC sling.  SURGEON:  Excell Seltzer. Annabell Howells, M.D.  ANESTHESIA:  General.  SPECIMEN:  None.  DRAINS:  Foley catheter and vaginal pack.  BLOOD LOSS:  20 mL.  COMPLICATIONS:  None.  INDICATIONS:  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  FINDINGS OF PROCEDURE:  She was given Cipro.  She was taken to operating room where general anesthetic was induced.  She was fitted with PAS hose and placed in lithotomy position.  Her mons was clipped.  She was prepped with Betadine solution and draped in the usual sterile fashion.  Time-out was performed.  A weighted vaginal retractor was placed.  A 16- French Foley catheter was inserted.  The bladder was drained.  The catheter was plugged . The anterior vaginal wall was infiltrated with 10 mL of 1% lidocaine with epinephrine.  A midline anterior vaginal wall incision was made over the mid urethral level.  Mucosa was elevated off the pubourethral fascia sufficient to allow passage of the tip of the index finger.  Once the vaginal dissection was performed, small incisions were made just superior to the pubis approximately 2 cm lateral to the midline on each side.  The fat was spread to the top of the pubis with a hemostat.  The [facility] trocar was passed on the left. The tip of the trocar was taken down to the top of the pubis, it was while pulling back the pubis and then advanced into the vaginal incision under finger guidance.  This was then repeated on  the right side.  At this point, cystoscopy was performed using a 22-French scope and 70- degree lens.  No evidence of bladder wall trauma or other intravesical abnormalities were noted.  The [facility] mesh was then secured to the trocars and pulled up into the abdominal incisions and cystoscopy was repeated once again with no evidence of bladder wall injury.  At this point, the tension of the mesh was suggested and the sheaths were removed.  The Foley catheter had been reinserted before this maneuver.  Once the sling was in good position, the anterior vaginal wall was closed with a running locked 2-0 Vicryl suture.  The abdominal incisions were clean, dried, and closed with Dermabond.  A 2-inch iodoform vaginal pack was placed.  The catheter was placed to straight drainage.  The patient was taken down from lithotomy position.  Her anesthetic was reversed.  She was moved to recovery room in stable condition.  There were no complications.     Excell Seltzer. Annabell Howells, M.D.     JJW/MEDQ  D:  10/19/2010  T:  10/19/2010  Job:  [id number]  cc:  Burnell Blanks, MD Fax: [phone]  Electronically Signed by Bjorn Pippin M.D. on 10/21/2010 10:49:59 AM

## 2010-11-02 NOTE — H&P (Signed)
NAME:  Jessica Kaufman, Jessica Kaufman            ACCOUNT NO.:  [id number]  MEDICAL RECORD NO.:  [id number]  LOCATION:                                 FACILITY:  PHYSICIAN:  Excell Seltzer. Annabell Howells, M.D.    DATE OF BIRTH:  01-20-1925  DATE OF ADMISSION: DATE OF DISCHARGE:                             HISTORY & PHYSICAL   CHIEF COMPLAINT:  Leak history.  HISTORY:  Ms. Jessica Kaufman is a 77 year old white female with a history of intrinsic sphincter deficiency and incontinence who is to undergo placement of a Sparc sling.  PAST MEDICAL HISTORY:  Significant for: 1. Chronic cystitis. 2. Left renal stones. 3. Atrophic vaginitis. 4. Mixed incontinence with intrinsic sphincter deficiency. 5. Arthritis. 6. History of basal cell carcinoma of skin. 7. Fibrocystic breast disease. 8. Bronchitis. 9. Reflux. 10.Gout. 11.Hypercholesterolemia. 12.Hypertension. 13.Hypothyroidism. 14.Heart murmurs. 15.Rhythm disorder. 16.History of deep venous thrombosis.  SURGICAL HISTORY:  Pertinent for: 1. Cystocele. 2. Cataract surgery. 3. D and C. 4. Elbow surgery. 5. Breast biopsy. 6. Bilateral total hip replacements. 7. Vaginal hysterectomy.  ADMISSION MEDICATIONS:  Include: 1. Allegra. 2. Allopurinol 300 mg daily. 3. Aspirin 325 mg daily. 4. Bilberry caps. 5. Calcium plus D daily. 6. Centrum Silver daily. 7. CoQ10 100 mg daily. 8. Fish oil daily. 9. Glucosamine/chondroitin. 10.Hydrochlorothiazide 25 mg daily. 11.Metamucil. 12.Metoprolol 50 mg daily. 13.Osteo Bi-Flex. 14.Premarin cream. 15.Prevacid 15 mg daily. 16.__________ 10 mg daily. 17.Singulair 10 mg daily. 18.Stool softener. 19.Synthroid 50 mcg daily. 20.Tylenol Arthritis for pain. 21.VESIcare 10 mg daily. 22.Vitamin D3.  MEDICATION ALLERGIES:  Include: 1. METRONIDAZOLE. 2. NAPROSYN. 3. SULINDAC.  FAMILY HISTORY:  Significant for stones and heart disease.  SOCIAL HISTORY:  She is a rare caffeine user.  She has never smoked. She is  widowed.  She is retired.  REVIEW OF SYSTEMS:  She has marked urinary leakage, but is otherwise entirely without complaints.  PHYSICAL EXAM:  VITAL SIGNS:  Blood pressure is 153/83, heart rate 59, and temperature 97.9. GENERAL:  She is a well-developed, well-nourished, elderly white female, in no acute distress. HEAD AND FACE:  Normocephalic, atraumatic. NECK:  Supple without mass. LUNGS:  Clear with normal effort. HEART:  Regular rate and rhythm.  There is no peripheral edema. ABDOMEN:  Moderately obese, soft, nontender, without mass or hepatosplenomegaly or CVA tenderness.  No hernias are noted.  No hepatosplenomegaly is noted. GU:  Exam reveals urethral hypermobility with marked leakage.  Vaginal atrophy without cystocele.  There is a moderate rectocele.  Cervix and uterus are absent.  She has no femoral or inguinal adenopathy. SKIN:  Warm and dry. NEUROLOGIC:  She has no focal deficits and is alert and  oriented x3.  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  PLAN:  She will undergo a sling procedure.  The risks of this procedure were explained in detail including bleeding, infection, voiding difficulty, chronic pain, mesh extrusion or erosion, de novo urge incontinence, and persistent stress incontinence.  She should have approximately 75-80% likelihood of a successful outcome.     Excell Seltzer. Annabell Howells, M.D.     JJW/MEDQ  D:  11/01/2010  T:  11/01/2010  Job:  [id number]  cc:   Burnell Blanks,  MD Fax: [phone]  Electronically Signed by Bjorn Pippin M.D. on 11/02/2010 11:00:00 AM

## 2013-06-25 ENCOUNTER — Encounter (INDEPENDENT_AMBULATORY_CARE_PROVIDER_SITE_OTHER): Payer: Self-pay

## 2013-06-25 ENCOUNTER — Ambulatory Visit (INDEPENDENT_AMBULATORY_CARE_PROVIDER_SITE_OTHER): Payer: Commercial Managed Care - HMO | Admitting: Diagnostic Neuroimaging

## 2013-06-25 ENCOUNTER — Encounter: Payer: Self-pay | Admitting: Diagnostic Neuroimaging

## 2013-06-25 VITALS — BP 158/76 | HR 58 | Temp 97.2°F | Ht 63.5 in | Wt 197.0 lb

## 2013-06-25 DIAGNOSIS — R209 Unspecified disturbances of skin sensation: Secondary | ICD-10-CM

## 2013-06-25 DIAGNOSIS — R200 Anesthesia of skin: Secondary | ICD-10-CM

## 2013-06-25 NOTE — Progress Notes (Signed)
[facility]  PATIENT: Jessica Kaufman DOB: 04-22-1925  REFERRING CLINICIAN: Hamrick HISTORY FROM: patient  REASON FOR VISIT: new consult   HISTORICAL  CHIEF COMPLAINT:  Chief Complaint  Patient presents with  . Peripheral Neuropathy    pain and numbness in both feet    HISTORY OF PRESENT ILLNESS:   80 year-old female here for evaluation of numbness in her feet.  10 years ago patient had right hip surgery and postoperatively developed some mild numbness in her right foot. 4 years ago patient had left hip surgery and postoperatively developed left foot numbness. Nowadays patient having numbness in the balls of her feet. No pain. No falls. She's having some mild unsteadiness. She has some low back pain and tells me that she was diagnosed with lumbar spinal stenosis many years ago. She never had surgery. She denies any radiating pain from her low back down to her feet. She has some urinary incontinence without bowel incontinence.   REVIEW OF SYSTEMS: Full 14 system review of systems performed and notable only for numbness easy bruising heart murmur skin moles.  ALLERGIES: Allergies  Allergen Reactions  . Metronidazole   . Naproxen   . Sulindac     HOME MEDICATIONS: No outpatient prescriptions prior to visit.   No facility-administered medications prior to visit.    PAST MEDICAL HISTORY: Past Medical History  Diagnosis Date  . PVC (premature ventricular contraction)   . Hypertension     PAST SURGICAL HISTORY: History reviewed. No pertinent past surgical history.  FAMILY HISTORY: Family History  Problem Relation Age of Onset  . Stroke Mother   . Melanoma Mother   . Heart attack Father   . Uterine cancer Sister   . Melanoma Sister   . Heart failure Sister   . Heart attack Maternal Grandmother   . Breast cancer Daughter     SOCIAL HISTORY:  History   Social History  . Marital Status: Widowed    Spouse Name: N/A    Number of  Children: 7  . Years of Education: 12th   Occupational History  . Retired    Social History Main Topics  . Smoking status: Never Smoker   . Smokeless tobacco: Never Used  . Alcohol Use: No  . Drug Use: No  . Sexual Activity: Not on file   Other Topics Concern  . Not on file   Social History Narrative   Patient lives at home alone.   Caffeine Use: 1 cup daily   7 children (1 deceased)     PHYSICAL EXAM  Filed Vitals:   06/25/13 1038  BP: 158/76  Pulse: 58  Temp: 97.2 F (36.2 C)  TempSrc: Oral  Height: 5' 3.5" (1.613 m)  Weight: 197 lb (89.359 kg)    Not recorded    Body mass index is 34.35 kg/(m^2).  GENERAL EXAM: Patient is in no distress; well developed, nourished and groomed; neck is supple  CARDIOVASCULAR: Regular rate and rhythm, no murmurs, no carotid bruits  NEUROLOGIC: MENTAL STATUS: awake, alert, oriented to person, place and time, recent and remote memory intact, normal attention and concentration, language fluent, comprehension intact, naming intact, fund of knowledge appropriate CRANIAL NERVE: no papilledema on fundoscopic exam, pupils equal and reactive to light, visual fields full to confrontation, extraocular muscles intact, no nystagmus, facial sensation and strength symmetric, hearing intact, palate elevates symmetrically, uvula midline, shoulder shrug symmetric, tongue midline. MOTOR: normal bulk and tone, full strength in the BUE, BLE SENSORY: normal  and symmetric to light touch, pinprick, temperature; EXCEPT RIGHT TOE VIB 10 SEC, LEFT TOE VIB 8 SEC. COORDINATION: finger-nose-finger, fine finger movements normal REFLEXES: deep tendon reflexes present and symmetric; EXCEPT ABSENT AT ANKLES GAIT/STATION: narrow based gait; able to walk on toes, heels and tandem; romberg is negative    DIAGNOSTIC DATA (LABS, IMAGING, TESTING) - I reviewed patient records, labs, notes, testing and imaging myself where available.  Lab Results  Component Value  Date   WBC 8.3 05/31/2009   HGB 13.3 10/19/2010   HCT 39.0 10/19/2010   MCV 93.9 05/31/2009   PLT 172 05/31/2009      Component Value Date/Time   NA 139 10/19/2010 0709   K 3.8 10/19/2010 0709   CL 103 05/30/2009 0455   CO2 26 05/30/2009 0455   GLUCOSE 106* 10/19/2010 0709   BUN 12 05/30/2009 0455   CREATININE 0.75 05/30/2009 0455   CALCIUM 8.3* 05/30/2009 0455   PROT 6.9 05/20/2009 1500   ALBUMIN 4.0 05/20/2009 1500   AST 26 05/20/2009 1500   ALT 20 05/20/2009 1500   ALKPHOS 48 05/20/2009 1500   BILITOT 0.5 05/20/2009 1500   GFRNONAA >60 05/30/2009 0455   GFRAA  Value: >60        The eGFR has been calculated using the MDRD equation. This calculation has not been validated in all clinical situations. eGFR's persistently <60 mL/min signify possible Chronic Kidney Disease. 05/30/2009 0455   No results found for this basename: CHOL, HDL, LDLCALC, LDLDIRECT, TRIG, CHOLHDL   No results found for this basename: HGBA1C   No results found for this basename: VITAMINB12   No results found for this basename: TSH     ASSESSMENT AND PLAN  80 y.o. year old female here with numbness and tingling in feet since 2005. Some of these symptoms started post-operatively from her hip surgeries.   Ddx: lumbar spinal stenosis, lumbar radiculopathy, peripheral neuropathy   PLAN: - symptoms are mild at this time; will observe for now - may consider MRI lumbar spine, EMG and other neuropathy lab testing in future if symptoms significantly progress  Return in about 6 months (around 12/25/2013).    Penni Bombard, MD 2/50/5397, 78:34 AM Certified in Neurology, Neurophysiology and Neuroimaging  [facility] [address], [place], [place] [address] [phone]

## 2013-06-25 NOTE — Patient Instructions (Signed)
Monitor symptoms. May consider MRI and electrical test in future if symptoms worsen.

## 2013-08-13 ENCOUNTER — Encounter: Payer: Self-pay | Admitting: Cardiovascular Disease

## 2013-08-13 DIAGNOSIS — I493 Ventricular premature depolarization: Secondary | ICD-10-CM

## 2013-12-25 ENCOUNTER — Ambulatory Visit (INDEPENDENT_AMBULATORY_CARE_PROVIDER_SITE_OTHER): Payer: Commercial Managed Care - HMO | Admitting: Diagnostic Neuroimaging

## 2013-12-25 ENCOUNTER — Encounter: Payer: Self-pay | Admitting: Diagnostic Neuroimaging

## 2013-12-25 VITALS — BP 130/72 | HR 58 | Temp 96.4°F | Ht 66.0 in | Wt 203.8 lb

## 2013-12-25 DIAGNOSIS — R200 Anesthesia of skin: Secondary | ICD-10-CM

## 2013-12-25 DIAGNOSIS — R208 Other disturbances of skin sensation: Secondary | ICD-10-CM

## 2013-12-25 NOTE — Patient Instructions (Signed)
Follow up as needed

## 2013-12-25 NOTE — Progress Notes (Signed)
[facility]  PATIENT: Jessica Kaufman DOB: Feb 07, 1925  REFERRING CLINICIAN: Hamrick HISTORY FROM: patient  REASON FOR VISIT: follow up   HISTORICAL  CHIEF COMPLAINT:  Chief Complaint  Patient presents with  . Follow-up    numbness    HISTORY OF PRESENT ILLNESS:   UPDATE 12/25/13: Since last visit, sxs are stable. No progression. No significant pain. Has had a gout flare a few months ago.   PRIOR HPI (06/25/13): 80 year-old female here for evaluation of numbness in her feet. 10 years ago patient had right hip surgery and postoperatively developed some mild numbness in her right foot. 4 years ago patient had left hip surgery and postoperatively developed left foot numbness. Nowadays patient having numbness in the balls of her feet. No pain. No falls. She's having some mild unsteadiness. She has some low back pain and tells me that she was diagnosed with lumbar spinal stenosis many years ago. She never had surgery. She denies any radiating pain from her low back down to her feet. She has some urinary incontinence without bowel incontinence.   REVIEW OF SYSTEMS: Full 14 system review of systems performed and notable only for numbness ringing in ears murmur joint pain back pain moles easy bruising env allergies.   ALLERGIES: Allergies  Allergen Reactions  . Metronidazole   . Naproxen   . Sulindac     HOME MEDICATIONS: Outpatient Prescriptions Prior to Visit  Medication Sig Dispense Refill  . acetaminophen (TYLENOL ARTHRITIS PAIN) 650 MG CR tablet Take 650 mg by mouth every 8 (eight) hours as needed for pain.    Marland Kitchen allopurinol (ZYLOPRIM) 300 MG tablet Take 1 tablet by mouth daily.    Marland Kitchen amLODipine (NORVASC) 10 MG tablet Take 0.5 tablets by mouth daily.    Marland Kitchen aspirin 325 MG EC tablet Take 325 mg by mouth daily.    . Bilberry, Vaccinium myrtillus, 1000 MG CAPS Take 2 capsules by mouth daily.    Marland Kitchen Bioflavonoid Products (BIOFLEX) TABS Take 2 tablets by mouth  daily.    . Cholecalciferol (VITAMIN D3) 1000 UNITS CAPS Take 1 capsule by mouth daily.    . Coenzyme Q10 (COQ10) 100 MG CAPS Take 1 capsule by mouth daily.    Mariane Baumgarten Calcium (STOOL SOFTENER PO) Take by mouth as needed.    Marland Kitchen estrogens, conjugated, (PREMARIN) 0.625 MG tablet Place 0.625 mg vaginally 2 (two) times a week. Take daily for 21 days then do not take for 7 days.    . fexofenadine (ALLEGRA) 180 MG tablet Take 180 mg by mouth daily.    . lansoprazole (PREVACID) 15 MG capsule Take 15 mg by mouth daily as needed.    Marland Kitchen levothyroxine (SYNTHROID, LEVOTHROID) 50 MCG tablet Take 1 tablet by mouth daily.    Marland Kitchen losartan-hydrochlorothiazide (HYZAAR) 100-25 MG per tablet Take 1 tablet by mouth daily.    . Magnesium Oxide (PHILLIPS PO) Take by mouth daily as needed.    . meclizine (ANTIVERT) 25 MG tablet Take 25 mg by mouth 3 (three) times daily as needed for dizziness.    . metoprolol succinate (TOPROL-XL) 25 MG 24 hr tablet Take 1 tablet by mouth daily.    . Multiple Vitamins-Minerals (CENTRUM SILVER PO) Take 1 tablet by mouth daily.    . Multiple Vitamins-Minerals (PRESERVISION AREDS 2) CAPS Take 2 capsules by mouth daily.    . Omega-3 Fatty Acids (FISH OIL) 1000 MG CAPS Take 2 capsules by mouth daily.    Marland Kitchen oxybutynin (OXYTROL)  3.9 MG/24HR Place 1 patch onto the skin daily.    . psyllium (METAMUCIL) 58.6 % powder Take 1 packet by mouth daily.    Marland Kitchen trimethoprim (TRIMPEX) 100 MG tablet Take 1 tablet by mouth daily.    . Calcium Carbonate-Vitamin D (CALCIUM 500 + D PO) Take 2 tablets by mouth daily.     No facility-administered medications prior to visit.    PAST MEDICAL HISTORY: Past Medical History  Diagnosis Date  . PVC (premature ventricular contraction)   . Hypertension     PAST SURGICAL HISTORY: Past Surgical History  Procedure Laterality Date  . Breast biopsy Left 1986  . Hysteroscopy  06/17/1986  . Bony pelvis surgery  1990  . Elbow surgery  06/1990  . Cataract extraction  Left 11/15/00  . Cataract extraction Right   . Hip surgery Left 05/28/09  . Hip surgery Right 06/30/03  . Bladder surgery  10/19/2010  . Cardiac surgery  2009    PVC    FAMILY HISTORY: Family History  Problem Relation Age of Onset  . Stroke Mother   . Melanoma Mother   . Heart attack Father   . Uterine cancer Sister   . Melanoma Sister   . Heart failure Sister   . Heart attack Maternal Grandmother   . Breast cancer Daughter     SOCIAL HISTORY:  History   Social History  . Marital Status: Widowed    Spouse Name: N/A    Number of Children: 7  . Years of Education: 12th   Occupational History  . Retired    Social History Main Topics  . Smoking status: Never Smoker   . Smokeless tobacco: Never Used  . Alcohol Use: No  . Drug Use: No  . Sexual Activity: Not on file   Other Topics Concern  . Not on file   Social History Narrative   Patient lives at home alone.   Caffeine Use: 1 cup daily   7 children (1 deceased)     PHYSICAL EXAM  Filed Vitals:   12/25/13 1142  BP: 130/72  Pulse: 58  Temp: 96.4 F (35.8 C)  TempSrc: Oral  Height: $Remove'5\' 6"'eMzcFcn$  (1.676 m)  Weight: 203 lb 12.8 oz (92.443 kg)    Not recorded      Body mass index is 32.91 kg/(m^2).  GENERAL EXAM: Patient is in no distress; well developed, nourished and groomed; neck is supple  CARDIOVASCULAR: Regular rate and rhythm, no murmurs, no carotid bruits  NEUROLOGIC: MENTAL STATUS: awake, alert, language fluent, comprehension intact, naming intact, fund of knowledge appropriate CRANIAL NERVE: pupils equal and reactive to light, visual fields full to confrontation, extraocular muscles intact, no nystagmus, facial sensation and strength symmetric, hearing intact, palate elevates symmetrically, uvula midline, shoulder shrug symmetric, tongue midline. MOTOR: normal bulk and tone, full strength in the BUE, BLE SENSORY: normal and symmetric to light touch COORDINATION: finger-nose-finger, fine finger  movements normal REFLEXES: deep tendon reflexes present and symmetric; EXCEPT ABSENT AT ANKLES GAIT/STATION: narrow based gait; romberg is negative    DIAGNOSTIC DATA (LABS, IMAGING, TESTING) - I reviewed patient records, labs, notes, testing and imaging myself where available.  Lab Results  Component Value Date   WBC 8.3 05/31/2009   HGB 13.3 10/19/2010   HCT 39.0 10/19/2010   MCV 93.9 05/31/2009   PLT 172 05/31/2009      Component Value Date/Time   NA 139 10/19/2010 0709   K 3.8 10/19/2010 0709   CL 103 05/30/2009 0455  CO2 26 05/30/2009 0455   GLUCOSE 106* 10/19/2010 0709   BUN 12 05/30/2009 0455   CREATININE 0.75 05/30/2009 0455   CALCIUM 8.3* 05/30/2009 0455   PROT 6.9 05/20/2009 1500   ALBUMIN 4.0 05/20/2009 1500   AST 26 05/20/2009 1500   ALT 20 05/20/2009 1500   ALKPHOS 48 05/20/2009 1500   BILITOT 0.5 05/20/2009 1500   GFRNONAA >60 05/30/2009 0455   GFRAA  05/30/2009 0455    >60        The eGFR has been calculated using the MDRD equation. This calculation has not been validated in all clinical situations. eGFR's persistently <60 mL/min signify possible Chronic Kidney Disease.   No results found for: CHOL No results found for: HGBA1C No results found for: VITAMINB12 No results found for: TSH   ASSESSMENT AND PLAN  80 y.o. year old female here with numbness and tingling in feet since 2005. Some of these symptoms started post-operatively from her hip surgeries. Suspect lumbar radiculopathy vs idiopathic neuropathy.  Ddx: lumbar spinal stenosis, lumbar radiculopathy, peripheral neuropathy   PLAN: - symptoms are mild at this time; will observe for now   Return if symptoms worsen or fail to improve, for return to PCP.    Penni Bombard, MD 05/69/7948, 01:65 PM Certified in Neurology, Neurophysiology and Neuroimaging  [facility] [address] [place], [place] [address] [phone]

## 2015-01-09 DIAGNOSIS — M129 Arthropathy, unspecified: Secondary | ICD-10-CM | POA: Diagnosis not present

## 2015-01-09 DIAGNOSIS — M0589 Other rheumatoid arthritis with rheumatoid factor of multiple sites: Secondary | ICD-10-CM | POA: Diagnosis not present

## 2015-01-09 DIAGNOSIS — M2560 Stiffness of unspecified joint, not elsewhere classified: Secondary | ICD-10-CM | POA: Diagnosis not present

## 2015-01-09 DIAGNOSIS — M1990 Unspecified osteoarthritis, unspecified site: Secondary | ICD-10-CM | POA: Diagnosis not present

## 2015-01-26 DIAGNOSIS — R55 Syncope and collapse: Secondary | ICD-10-CM | POA: Diagnosis not present

## 2015-01-26 DIAGNOSIS — R002 Palpitations: Secondary | ICD-10-CM | POA: Diagnosis not present

## 2015-01-26 DIAGNOSIS — E668 Other obesity: Secondary | ICD-10-CM | POA: Diagnosis not present

## 2015-01-26 DIAGNOSIS — I10 Essential (primary) hypertension: Secondary | ICD-10-CM | POA: Diagnosis not present

## 2015-01-26 DIAGNOSIS — I359 Nonrheumatic aortic valve disorder, unspecified: Secondary | ICD-10-CM | POA: Diagnosis not present

## 2015-01-26 DIAGNOSIS — I493 Ventricular premature depolarization: Secondary | ICD-10-CM | POA: Diagnosis not present

## 2015-01-26 DIAGNOSIS — I480 Paroxysmal atrial fibrillation: Secondary | ICD-10-CM | POA: Diagnosis not present

## 2015-01-26 DIAGNOSIS — E785 Hyperlipidemia, unspecified: Secondary | ICD-10-CM | POA: Diagnosis not present

## 2015-01-30 DIAGNOSIS — M25531 Pain in right wrist: Secondary | ICD-10-CM | POA: Diagnosis not present

## 2015-01-30 DIAGNOSIS — Z6835 Body mass index (BMI) 35.0-35.9, adult: Secondary | ICD-10-CM | POA: Diagnosis not present

## 2015-01-30 DIAGNOSIS — S61411A Laceration without foreign body of right hand, initial encounter: Secondary | ICD-10-CM | POA: Diagnosis not present

## 2015-01-30 DIAGNOSIS — E669 Obesity, unspecified: Secondary | ICD-10-CM | POA: Diagnosis not present

## 2015-01-30 DIAGNOSIS — W19XXXA Unspecified fall, initial encounter: Secondary | ICD-10-CM | POA: Diagnosis not present

## 2015-02-06 DIAGNOSIS — S61411D Laceration without foreign body of right hand, subsequent encounter: Secondary | ICD-10-CM | POA: Diagnosis not present

## 2015-02-06 DIAGNOSIS — Z4802 Encounter for removal of sutures: Secondary | ICD-10-CM | POA: Diagnosis not present

## 2015-02-06 DIAGNOSIS — J189 Pneumonia, unspecified organism: Secondary | ICD-10-CM | POA: Diagnosis not present

## 2015-02-09 DIAGNOSIS — I10 Essential (primary) hypertension: Secondary | ICD-10-CM | POA: Diagnosis not present

## 2015-02-09 DIAGNOSIS — E668 Other obesity: Secondary | ICD-10-CM | POA: Diagnosis not present

## 2015-02-09 DIAGNOSIS — E785 Hyperlipidemia, unspecified: Secondary | ICD-10-CM | POA: Diagnosis not present

## 2015-02-09 DIAGNOSIS — I480 Paroxysmal atrial fibrillation: Secondary | ICD-10-CM | POA: Diagnosis not present

## 2015-02-09 DIAGNOSIS — R002 Palpitations: Secondary | ICD-10-CM | POA: Diagnosis not present

## 2015-02-09 DIAGNOSIS — I493 Ventricular premature depolarization: Secondary | ICD-10-CM | POA: Diagnosis not present

## 2015-02-09 DIAGNOSIS — I359 Nonrheumatic aortic valve disorder, unspecified: Secondary | ICD-10-CM | POA: Diagnosis not present

## 2015-02-16 DIAGNOSIS — N952 Postmenopausal atrophic vaginitis: Secondary | ICD-10-CM | POA: Diagnosis not present

## 2015-02-16 DIAGNOSIS — Z87448 Personal history of other diseases of urinary system: Secondary | ICD-10-CM | POA: Diagnosis not present

## 2015-02-16 DIAGNOSIS — Z0000 Encounter for general adult medical examination without abnormal findings: Secondary | ICD-10-CM | POA: Diagnosis not present

## 2015-02-16 DIAGNOSIS — N3946 Mixed incontinence: Secondary | ICD-10-CM | POA: Diagnosis not present

## 2015-02-24 DIAGNOSIS — H43399 Other vitreous opacities, unspecified eye: Secondary | ICD-10-CM | POA: Diagnosis not present

## 2015-02-24 DIAGNOSIS — H04123 Dry eye syndrome of bilateral lacrimal glands: Secondary | ICD-10-CM | POA: Diagnosis not present

## 2015-03-02 DIAGNOSIS — Z6834 Body mass index (BMI) 34.0-34.9, adult: Secondary | ICD-10-CM | POA: Diagnosis not present

## 2015-03-02 DIAGNOSIS — J189 Pneumonia, unspecified organism: Secondary | ICD-10-CM | POA: Diagnosis not present

## 2015-03-03 DIAGNOSIS — R0602 Shortness of breath: Secondary | ICD-10-CM | POA: Diagnosis not present

## 2015-03-03 DIAGNOSIS — J449 Chronic obstructive pulmonary disease, unspecified: Secondary | ICD-10-CM | POA: Diagnosis not present

## 2015-03-03 DIAGNOSIS — J189 Pneumonia, unspecified organism: Secondary | ICD-10-CM | POA: Diagnosis not present

## 2015-03-03 DIAGNOSIS — R05 Cough: Secondary | ICD-10-CM | POA: Diagnosis not present

## 2015-03-23 DIAGNOSIS — J189 Pneumonia, unspecified organism: Secondary | ICD-10-CM | POA: Diagnosis not present

## 2015-03-23 DIAGNOSIS — J449 Chronic obstructive pulmonary disease, unspecified: Secondary | ICD-10-CM | POA: Diagnosis not present

## 2015-03-30 DIAGNOSIS — N390 Urinary tract infection, site not specified: Secondary | ICD-10-CM | POA: Diagnosis not present

## 2015-03-30 DIAGNOSIS — B9620 Unspecified Escherichia coli [E. coli] as the cause of diseases classified elsewhere: Secondary | ICD-10-CM | POA: Diagnosis not present

## 2015-03-30 DIAGNOSIS — Z0000 Encounter for general adult medical examination without abnormal findings: Secondary | ICD-10-CM | POA: Diagnosis not present

## 2015-04-10 DIAGNOSIS — M2560 Stiffness of unspecified joint, not elsewhere classified: Secondary | ICD-10-CM | POA: Diagnosis not present

## 2015-04-10 DIAGNOSIS — M0589 Other rheumatoid arthritis with rheumatoid factor of multiple sites: Secondary | ICD-10-CM | POA: Diagnosis not present

## 2015-04-10 DIAGNOSIS — M129 Arthropathy, unspecified: Secondary | ICD-10-CM | POA: Diagnosis not present

## 2015-04-10 DIAGNOSIS — M1990 Unspecified osteoarthritis, unspecified site: Secondary | ICD-10-CM | POA: Diagnosis not present

## 2015-04-10 DIAGNOSIS — Z79899 Other long term (current) drug therapy: Secondary | ICD-10-CM | POA: Diagnosis not present

## 2015-04-30 DIAGNOSIS — W57XXXA Bitten or stung by nonvenomous insect and other nonvenomous arthropods, initial encounter: Secondary | ICD-10-CM | POA: Diagnosis not present

## 2015-04-30 DIAGNOSIS — Z6834 Body mass index (BMI) 34.0-34.9, adult: Secondary | ICD-10-CM | POA: Diagnosis not present

## 2015-04-30 DIAGNOSIS — L03119 Cellulitis of unspecified part of limb: Secondary | ICD-10-CM | POA: Diagnosis not present

## 2015-04-30 DIAGNOSIS — Z139 Encounter for screening, unspecified: Secondary | ICD-10-CM | POA: Diagnosis not present

## 2015-04-30 DIAGNOSIS — Z1389 Encounter for screening for other disorder: Secondary | ICD-10-CM | POA: Diagnosis not present

## 2015-05-18 DIAGNOSIS — I493 Ventricular premature depolarization: Secondary | ICD-10-CM | POA: Diagnosis not present

## 2015-05-18 DIAGNOSIS — E785 Hyperlipidemia, unspecified: Secondary | ICD-10-CM | POA: Diagnosis not present

## 2015-05-18 DIAGNOSIS — I359 Nonrheumatic aortic valve disorder, unspecified: Secondary | ICD-10-CM | POA: Diagnosis not present

## 2015-05-18 DIAGNOSIS — I480 Paroxysmal atrial fibrillation: Secondary | ICD-10-CM | POA: Diagnosis not present

## 2015-05-18 DIAGNOSIS — R002 Palpitations: Secondary | ICD-10-CM | POA: Diagnosis not present

## 2015-05-18 DIAGNOSIS — E668 Other obesity: Secondary | ICD-10-CM | POA: Diagnosis not present

## 2015-05-18 DIAGNOSIS — I10 Essential (primary) hypertension: Secondary | ICD-10-CM | POA: Diagnosis not present

## 2015-06-02 DIAGNOSIS — Z96643 Presence of artificial hip joint, bilateral: Secondary | ICD-10-CM | POA: Diagnosis not present

## 2015-06-02 DIAGNOSIS — Z471 Aftercare following joint replacement surgery: Secondary | ICD-10-CM | POA: Diagnosis not present

## 2015-06-23 DIAGNOSIS — I10 Essential (primary) hypertension: Secondary | ICD-10-CM | POA: Diagnosis not present

## 2015-06-23 DIAGNOSIS — Z9181 History of falling: Secondary | ICD-10-CM | POA: Diagnosis not present

## 2015-06-23 DIAGNOSIS — Z23 Encounter for immunization: Secondary | ICD-10-CM | POA: Diagnosis not present

## 2015-06-23 DIAGNOSIS — E039 Hypothyroidism, unspecified: Secondary | ICD-10-CM | POA: Diagnosis not present

## 2015-06-23 DIAGNOSIS — I480 Paroxysmal atrial fibrillation: Secondary | ICD-10-CM | POA: Diagnosis not present

## 2015-06-23 DIAGNOSIS — M109 Gout, unspecified: Secondary | ICD-10-CM | POA: Diagnosis not present

## 2015-06-23 DIAGNOSIS — Z6834 Body mass index (BMI) 34.0-34.9, adult: Secondary | ICD-10-CM | POA: Diagnosis not present

## 2015-06-23 DIAGNOSIS — E559 Vitamin D deficiency, unspecified: Secondary | ICD-10-CM | POA: Diagnosis not present

## 2015-06-23 DIAGNOSIS — N182 Chronic kidney disease, stage 2 (mild): Secondary | ICD-10-CM | POA: Diagnosis not present

## 2015-06-23 DIAGNOSIS — E7800 Pure hypercholesterolemia, unspecified: Secondary | ICD-10-CM | POA: Diagnosis not present

## 2015-07-17 DIAGNOSIS — M2560 Stiffness of unspecified joint, not elsewhere classified: Secondary | ICD-10-CM | POA: Diagnosis not present

## 2015-07-17 DIAGNOSIS — M0589 Other rheumatoid arthritis with rheumatoid factor of multiple sites: Secondary | ICD-10-CM | POA: Diagnosis not present

## 2015-07-17 DIAGNOSIS — M129 Arthropathy, unspecified: Secondary | ICD-10-CM | POA: Diagnosis not present

## 2015-07-17 DIAGNOSIS — M1990 Unspecified osteoarthritis, unspecified site: Secondary | ICD-10-CM | POA: Diagnosis not present

## 2015-08-17 DIAGNOSIS — H5213 Myopia, bilateral: Secondary | ICD-10-CM | POA: Diagnosis not present

## 2015-08-17 DIAGNOSIS — H04123 Dry eye syndrome of bilateral lacrimal glands: Secondary | ICD-10-CM | POA: Diagnosis not present

## 2015-08-17 DIAGNOSIS — H353230 Exudative age-related macular degeneration, bilateral, stage unspecified: Secondary | ICD-10-CM | POA: Diagnosis not present

## 2015-09-18 DIAGNOSIS — R300 Dysuria: Secondary | ICD-10-CM | POA: Diagnosis not present

## 2015-09-18 DIAGNOSIS — Z6834 Body mass index (BMI) 34.0-34.9, adult: Secondary | ICD-10-CM | POA: Diagnosis not present

## 2015-09-28 DIAGNOSIS — H353230 Exudative age-related macular degeneration, bilateral, stage unspecified: Secondary | ICD-10-CM | POA: Diagnosis not present

## 2015-10-01 DIAGNOSIS — N390 Urinary tract infection, site not specified: Secondary | ICD-10-CM | POA: Diagnosis not present

## 2015-10-01 DIAGNOSIS — R350 Frequency of micturition: Secondary | ICD-10-CM | POA: Diagnosis not present

## 2015-10-01 DIAGNOSIS — Z6834 Body mass index (BMI) 34.0-34.9, adult: Secondary | ICD-10-CM | POA: Diagnosis not present

## 2015-10-01 DIAGNOSIS — L989 Disorder of the skin and subcutaneous tissue, unspecified: Secondary | ICD-10-CM | POA: Diagnosis not present

## 2015-10-08 DIAGNOSIS — L259 Unspecified contact dermatitis, unspecified cause: Secondary | ICD-10-CM | POA: Diagnosis not present

## 2015-10-08 DIAGNOSIS — L989 Disorder of the skin and subcutaneous tissue, unspecified: Secondary | ICD-10-CM | POA: Diagnosis not present

## 2015-10-21 DIAGNOSIS — Z79899 Other long term (current) drug therapy: Secondary | ICD-10-CM | POA: Diagnosis not present

## 2015-10-21 DIAGNOSIS — Z23 Encounter for immunization: Secondary | ICD-10-CM | POA: Diagnosis not present

## 2015-11-02 DIAGNOSIS — R002 Palpitations: Secondary | ICD-10-CM | POA: Diagnosis not present

## 2015-11-02 DIAGNOSIS — I10 Essential (primary) hypertension: Secondary | ICD-10-CM | POA: Diagnosis not present

## 2015-11-02 DIAGNOSIS — I480 Paroxysmal atrial fibrillation: Secondary | ICD-10-CM | POA: Diagnosis not present

## 2015-11-02 DIAGNOSIS — E668 Other obesity: Secondary | ICD-10-CM | POA: Diagnosis not present

## 2015-11-02 DIAGNOSIS — E785 Hyperlipidemia, unspecified: Secondary | ICD-10-CM | POA: Diagnosis not present

## 2015-11-02 DIAGNOSIS — I359 Nonrheumatic aortic valve disorder, unspecified: Secondary | ICD-10-CM | POA: Diagnosis not present

## 2015-11-02 DIAGNOSIS — I493 Ventricular premature depolarization: Secondary | ICD-10-CM | POA: Diagnosis not present

## 2015-11-06 DIAGNOSIS — Z1231 Encounter for screening mammogram for malignant neoplasm of breast: Secondary | ICD-10-CM | POA: Diagnosis not present

## 2015-11-23 DIAGNOSIS — I480 Paroxysmal atrial fibrillation: Secondary | ICD-10-CM | POA: Diagnosis not present

## 2015-11-23 DIAGNOSIS — Z6835 Body mass index (BMI) 35.0-35.9, adult: Secondary | ICD-10-CM | POA: Diagnosis not present

## 2015-11-23 DIAGNOSIS — L309 Dermatitis, unspecified: Secondary | ICD-10-CM | POA: Diagnosis not present

## 2015-12-29 DIAGNOSIS — M8580 Other specified disorders of bone density and structure, unspecified site: Secondary | ICD-10-CM | POA: Diagnosis not present

## 2015-12-29 DIAGNOSIS — E559 Vitamin D deficiency, unspecified: Secondary | ICD-10-CM | POA: Diagnosis not present

## 2015-12-29 DIAGNOSIS — I480 Paroxysmal atrial fibrillation: Secondary | ICD-10-CM | POA: Diagnosis not present

## 2015-12-29 DIAGNOSIS — E7800 Pure hypercholesterolemia, unspecified: Secondary | ICD-10-CM | POA: Diagnosis not present

## 2015-12-29 DIAGNOSIS — Z6835 Body mass index (BMI) 35.0-35.9, adult: Secondary | ICD-10-CM | POA: Diagnosis not present

## 2015-12-29 DIAGNOSIS — E039 Hypothyroidism, unspecified: Secondary | ICD-10-CM | POA: Diagnosis not present

## 2015-12-29 DIAGNOSIS — M109 Gout, unspecified: Secondary | ICD-10-CM | POA: Diagnosis not present

## 2015-12-29 DIAGNOSIS — Z79899 Other long term (current) drug therapy: Secondary | ICD-10-CM | POA: Diagnosis not present

## 2015-12-29 DIAGNOSIS — E669 Obesity, unspecified: Secondary | ICD-10-CM | POA: Diagnosis not present

## 2015-12-29 DIAGNOSIS — I10 Essential (primary) hypertension: Secondary | ICD-10-CM | POA: Diagnosis not present

## 2015-12-29 DIAGNOSIS — L989 Disorder of the skin and subcutaneous tissue, unspecified: Secondary | ICD-10-CM | POA: Diagnosis not present

## 2016-01-13 DIAGNOSIS — Z87440 Personal history of urinary (tract) infections: Secondary | ICD-10-CM | POA: Diagnosis not present

## 2016-01-13 DIAGNOSIS — N3946 Mixed incontinence: Secondary | ICD-10-CM | POA: Diagnosis not present

## 2016-01-15 DIAGNOSIS — Z6835 Body mass index (BMI) 35.0-35.9, adult: Secondary | ICD-10-CM | POA: Diagnosis not present

## 2016-01-15 DIAGNOSIS — R05 Cough: Secondary | ICD-10-CM | POA: Diagnosis not present

## 2016-01-15 DIAGNOSIS — M0589 Other rheumatoid arthritis with rheumatoid factor of multiple sites: Secondary | ICD-10-CM | POA: Diagnosis not present

## 2016-01-15 DIAGNOSIS — M2560 Stiffness of unspecified joint, not elsewhere classified: Secondary | ICD-10-CM | POA: Diagnosis not present

## 2016-01-15 DIAGNOSIS — M1990 Unspecified osteoarthritis, unspecified site: Secondary | ICD-10-CM | POA: Diagnosis not present

## 2016-01-15 DIAGNOSIS — M129 Arthropathy, unspecified: Secondary | ICD-10-CM | POA: Diagnosis not present

## 2016-01-15 DIAGNOSIS — J189 Pneumonia, unspecified organism: Secondary | ICD-10-CM | POA: Diagnosis not present

## 2016-01-26 DIAGNOSIS — L814 Other melanin hyperpigmentation: Secondary | ICD-10-CM | POA: Diagnosis not present

## 2016-01-26 DIAGNOSIS — C44729 Squamous cell carcinoma of skin of left lower limb, including hip: Secondary | ICD-10-CM | POA: Diagnosis not present

## 2016-02-04 DIAGNOSIS — C44729 Squamous cell carcinoma of skin of left lower limb, including hip: Secondary | ICD-10-CM | POA: Diagnosis not present

## 2016-03-24 DIAGNOSIS — H5203 Hypermetropia, bilateral: Secondary | ICD-10-CM | POA: Diagnosis not present

## 2016-03-24 DIAGNOSIS — H353230 Exudative age-related macular degeneration, bilateral, stage unspecified: Secondary | ICD-10-CM | POA: Diagnosis not present

## 2016-03-31 DIAGNOSIS — Z8701 Personal history of pneumonia (recurrent): Secondary | ICD-10-CM | POA: Diagnosis not present

## 2016-03-31 DIAGNOSIS — J069 Acute upper respiratory infection, unspecified: Secondary | ICD-10-CM | POA: Diagnosis not present

## 2016-04-13 DIAGNOSIS — E669 Obesity, unspecified: Secondary | ICD-10-CM | POA: Diagnosis not present

## 2016-04-13 DIAGNOSIS — Z6835 Body mass index (BMI) 35.0-35.9, adult: Secondary | ICD-10-CM | POA: Diagnosis not present

## 2016-04-13 DIAGNOSIS — J3089 Other allergic rhinitis: Secondary | ICD-10-CM | POA: Diagnosis not present

## 2016-04-14 DIAGNOSIS — J309 Allergic rhinitis, unspecified: Secondary | ICD-10-CM | POA: Diagnosis not present

## 2016-04-15 DIAGNOSIS — M1990 Unspecified osteoarthritis, unspecified site: Secondary | ICD-10-CM | POA: Diagnosis not present

## 2016-04-15 DIAGNOSIS — M0589 Other rheumatoid arthritis with rheumatoid factor of multiple sites: Secondary | ICD-10-CM | POA: Diagnosis not present

## 2016-04-15 DIAGNOSIS — M129 Arthropathy, unspecified: Secondary | ICD-10-CM | POA: Diagnosis not present

## 2016-04-15 DIAGNOSIS — M2560 Stiffness of unspecified joint, not elsewhere classified: Secondary | ICD-10-CM | POA: Diagnosis not present

## 2016-06-20 DIAGNOSIS — I480 Paroxysmal atrial fibrillation: Secondary | ICD-10-CM | POA: Diagnosis not present

## 2016-06-20 DIAGNOSIS — E039 Hypothyroidism, unspecified: Secondary | ICD-10-CM | POA: Diagnosis not present

## 2016-06-20 DIAGNOSIS — W19XXXA Unspecified fall, initial encounter: Secondary | ICD-10-CM | POA: Diagnosis not present

## 2016-06-20 DIAGNOSIS — M069 Rheumatoid arthritis, unspecified: Secondary | ICD-10-CM | POA: Diagnosis not present

## 2016-06-20 DIAGNOSIS — Z1389 Encounter for screening for other disorder: Secondary | ICD-10-CM | POA: Diagnosis not present

## 2016-06-20 DIAGNOSIS — I10 Essential (primary) hypertension: Secondary | ICD-10-CM | POA: Diagnosis not present

## 2016-06-20 DIAGNOSIS — Z79899 Other long term (current) drug therapy: Secondary | ICD-10-CM | POA: Diagnosis not present

## 2016-06-20 DIAGNOSIS — M109 Gout, unspecified: Secondary | ICD-10-CM | POA: Diagnosis not present

## 2016-07-22 DIAGNOSIS — M1990 Unspecified osteoarthritis, unspecified site: Secondary | ICD-10-CM | POA: Diagnosis not present

## 2016-07-22 DIAGNOSIS — M2560 Stiffness of unspecified joint, not elsewhere classified: Secondary | ICD-10-CM | POA: Diagnosis not present

## 2016-07-22 DIAGNOSIS — M0589 Other rheumatoid arthritis with rheumatoid factor of multiple sites: Secondary | ICD-10-CM | POA: Diagnosis not present

## 2016-07-22 DIAGNOSIS — M129 Arthropathy, unspecified: Secondary | ICD-10-CM | POA: Diagnosis not present

## 2016-07-22 DIAGNOSIS — Z79899 Other long term (current) drug therapy: Secondary | ICD-10-CM | POA: Diagnosis not present

## 2016-07-22 DIAGNOSIS — J189 Pneumonia, unspecified organism: Secondary | ICD-10-CM | POA: Diagnosis not present

## 2016-08-04 DIAGNOSIS — Z139 Encounter for screening, unspecified: Secondary | ICD-10-CM | POA: Diagnosis not present

## 2016-08-04 DIAGNOSIS — E785 Hyperlipidemia, unspecified: Secondary | ICD-10-CM | POA: Diagnosis not present

## 2016-08-04 DIAGNOSIS — Z6834 Body mass index (BMI) 34.0-34.9, adult: Secondary | ICD-10-CM | POA: Diagnosis not present

## 2016-08-04 DIAGNOSIS — Z1389 Encounter for screening for other disorder: Secondary | ICD-10-CM | POA: Diagnosis not present

## 2016-08-04 DIAGNOSIS — Z9181 History of falling: Secondary | ICD-10-CM | POA: Diagnosis not present

## 2016-08-04 DIAGNOSIS — Z0000 Encounter for general adult medical examination without abnormal findings: Secondary | ICD-10-CM | POA: Diagnosis not present

## 2016-08-04 DIAGNOSIS — Z136 Encounter for screening for cardiovascular disorders: Secondary | ICD-10-CM | POA: Diagnosis not present

## 2016-09-12 DIAGNOSIS — H353230 Exudative age-related macular degeneration, bilateral, stage unspecified: Secondary | ICD-10-CM | POA: Diagnosis not present

## 2016-09-29 DIAGNOSIS — Z6833 Body mass index (BMI) 33.0-33.9, adult: Secondary | ICD-10-CM | POA: Diagnosis not present

## 2016-09-29 DIAGNOSIS — R05 Cough: Secondary | ICD-10-CM | POA: Diagnosis not present

## 2016-09-29 DIAGNOSIS — H6122 Impacted cerumen, left ear: Secondary | ICD-10-CM | POA: Diagnosis not present

## 2016-10-13 DIAGNOSIS — Z23 Encounter for immunization: Secondary | ICD-10-CM | POA: Diagnosis not present

## 2016-10-19 DIAGNOSIS — M109 Gout, unspecified: Secondary | ICD-10-CM | POA: Diagnosis not present

## 2016-10-19 DIAGNOSIS — Z79899 Other long term (current) drug therapy: Secondary | ICD-10-CM | POA: Diagnosis not present

## 2016-10-19 DIAGNOSIS — E039 Hypothyroidism, unspecified: Secondary | ICD-10-CM | POA: Diagnosis not present

## 2016-10-19 DIAGNOSIS — E7800 Pure hypercholesterolemia, unspecified: Secondary | ICD-10-CM | POA: Diagnosis not present

## 2016-11-01 DIAGNOSIS — R002 Palpitations: Secondary | ICD-10-CM | POA: Diagnosis not present

## 2016-11-01 DIAGNOSIS — I119 Hypertensive heart disease without heart failure: Secondary | ICD-10-CM | POA: Diagnosis not present

## 2016-11-01 DIAGNOSIS — E668 Other obesity: Secondary | ICD-10-CM | POA: Diagnosis not present

## 2016-11-01 DIAGNOSIS — I359 Nonrheumatic aortic valve disorder, unspecified: Secondary | ICD-10-CM | POA: Diagnosis not present

## 2016-11-01 DIAGNOSIS — E7849 Other hyperlipidemia: Secondary | ICD-10-CM | POA: Diagnosis not present

## 2016-11-01 DIAGNOSIS — I493 Ventricular premature depolarization: Secondary | ICD-10-CM | POA: Diagnosis not present

## 2016-11-01 DIAGNOSIS — I480 Paroxysmal atrial fibrillation: Secondary | ICD-10-CM | POA: Diagnosis not present

## 2016-11-01 DIAGNOSIS — Z7901 Long term (current) use of anticoagulants: Secondary | ICD-10-CM | POA: Diagnosis not present

## 2016-11-11 DIAGNOSIS — M8589 Other specified disorders of bone density and structure, multiple sites: Secondary | ICD-10-CM | POA: Diagnosis not present

## 2016-11-11 DIAGNOSIS — Z803 Family history of malignant neoplasm of breast: Secondary | ICD-10-CM | POA: Diagnosis not present

## 2016-11-11 DIAGNOSIS — Z1231 Encounter for screening mammogram for malignant neoplasm of breast: Secondary | ICD-10-CM | POA: Diagnosis not present

## 2016-11-14 DIAGNOSIS — Z6823 Body mass index (BMI) 23.0-23.9, adult: Secondary | ICD-10-CM | POA: Diagnosis not present

## 2016-11-14 DIAGNOSIS — I10 Essential (primary) hypertension: Secondary | ICD-10-CM | POA: Diagnosis not present

## 2016-12-09 DIAGNOSIS — S81801A Unspecified open wound, right lower leg, initial encounter: Secondary | ICD-10-CM | POA: Diagnosis not present

## 2016-12-20 DIAGNOSIS — L03011 Cellulitis of right finger: Secondary | ICD-10-CM | POA: Diagnosis not present

## 2016-12-20 DIAGNOSIS — I10 Essential (primary) hypertension: Secondary | ICD-10-CM | POA: Diagnosis not present

## 2016-12-20 DIAGNOSIS — G609 Hereditary and idiopathic neuropathy, unspecified: Secondary | ICD-10-CM | POA: Diagnosis not present

## 2016-12-20 DIAGNOSIS — E669 Obesity, unspecified: Secondary | ICD-10-CM | POA: Diagnosis not present

## 2017-01-09 DIAGNOSIS — Z6833 Body mass index (BMI) 33.0-33.9, adult: Secondary | ICD-10-CM | POA: Diagnosis not present

## 2017-01-09 DIAGNOSIS — N390 Urinary tract infection, site not specified: Secondary | ICD-10-CM | POA: Diagnosis not present

## 2017-01-09 DIAGNOSIS — M7989 Other specified soft tissue disorders: Secondary | ICD-10-CM | POA: Diagnosis not present

## 2017-01-09 DIAGNOSIS — M25532 Pain in left wrist: Secondary | ICD-10-CM | POA: Diagnosis not present

## 2017-01-09 DIAGNOSIS — R600 Localized edema: Secondary | ICD-10-CM | POA: Diagnosis not present

## 2017-01-20 DIAGNOSIS — M1990 Unspecified osteoarthritis, unspecified site: Secondary | ICD-10-CM | POA: Diagnosis not present

## 2017-01-20 DIAGNOSIS — M25532 Pain in left wrist: Secondary | ICD-10-CM | POA: Diagnosis not present

## 2017-01-20 DIAGNOSIS — R42 Dizziness and giddiness: Secondary | ICD-10-CM | POA: Diagnosis not present

## 2017-01-20 DIAGNOSIS — Z79899 Other long term (current) drug therapy: Secondary | ICD-10-CM | POA: Diagnosis not present

## 2017-01-20 DIAGNOSIS — M0589 Other rheumatoid arthritis with rheumatoid factor of multiple sites: Secondary | ICD-10-CM | POA: Diagnosis not present

## 2017-01-23 DIAGNOSIS — I10 Essential (primary) hypertension: Secondary | ICD-10-CM | POA: Diagnosis not present

## 2017-01-23 DIAGNOSIS — Z6833 Body mass index (BMI) 33.0-33.9, adult: Secondary | ICD-10-CM | POA: Diagnosis not present

## 2017-01-23 DIAGNOSIS — N390 Urinary tract infection, site not specified: Secondary | ICD-10-CM | POA: Diagnosis not present

## 2017-02-07 ENCOUNTER — Encounter: Payer: Self-pay | Admitting: Internal Medicine

## 2017-02-07 DIAGNOSIS — R42 Dizziness and giddiness: Secondary | ICD-10-CM | POA: Diagnosis not present

## 2017-02-07 DIAGNOSIS — Z6833 Body mass index (BMI) 33.0-33.9, adult: Secondary | ICD-10-CM | POA: Diagnosis not present

## 2017-02-07 DIAGNOSIS — N3091 Cystitis, unspecified with hematuria: Secondary | ICD-10-CM | POA: Diagnosis not present

## 2017-02-22 DIAGNOSIS — Z6832 Body mass index (BMI) 32.0-32.9, adult: Secondary | ICD-10-CM | POA: Diagnosis not present

## 2017-02-22 DIAGNOSIS — R42 Dizziness and giddiness: Secondary | ICD-10-CM | POA: Diagnosis not present

## 2017-02-22 DIAGNOSIS — N390 Urinary tract infection, site not specified: Secondary | ICD-10-CM | POA: Diagnosis not present

## 2017-03-02 DIAGNOSIS — L821 Other seborrheic keratosis: Secondary | ICD-10-CM | POA: Diagnosis not present

## 2017-03-02 DIAGNOSIS — L570 Actinic keratosis: Secondary | ICD-10-CM | POA: Diagnosis not present

## 2017-03-03 DIAGNOSIS — I119 Hypertensive heart disease without heart failure: Secondary | ICD-10-CM | POA: Diagnosis not present

## 2017-03-03 DIAGNOSIS — Z7901 Long term (current) use of anticoagulants: Secondary | ICD-10-CM | POA: Diagnosis not present

## 2017-03-03 DIAGNOSIS — I359 Nonrheumatic aortic valve disorder, unspecified: Secondary | ICD-10-CM | POA: Diagnosis not present

## 2017-03-03 DIAGNOSIS — I493 Ventricular premature depolarization: Secondary | ICD-10-CM | POA: Diagnosis not present

## 2017-03-03 DIAGNOSIS — R55 Syncope and collapse: Secondary | ICD-10-CM | POA: Diagnosis not present

## 2017-03-03 DIAGNOSIS — R0602 Shortness of breath: Secondary | ICD-10-CM | POA: Diagnosis not present

## 2017-03-03 DIAGNOSIS — R42 Dizziness and giddiness: Secondary | ICD-10-CM | POA: Diagnosis not present

## 2017-03-03 DIAGNOSIS — E7849 Other hyperlipidemia: Secondary | ICD-10-CM | POA: Diagnosis not present

## 2017-03-03 DIAGNOSIS — I480 Paroxysmal atrial fibrillation: Secondary | ICD-10-CM | POA: Diagnosis not present

## 2017-03-03 DIAGNOSIS — E668 Other obesity: Secondary | ICD-10-CM | POA: Diagnosis not present

## 2017-03-04 DIAGNOSIS — R55 Syncope and collapse: Secondary | ICD-10-CM | POA: Diagnosis not present

## 2017-03-06 DIAGNOSIS — R55 Syncope and collapse: Secondary | ICD-10-CM | POA: Diagnosis not present

## 2017-03-06 DIAGNOSIS — Z7901 Long term (current) use of anticoagulants: Secondary | ICD-10-CM | POA: Diagnosis not present

## 2017-03-06 DIAGNOSIS — E668 Other obesity: Secondary | ICD-10-CM | POA: Diagnosis not present

## 2017-03-06 DIAGNOSIS — I359 Nonrheumatic aortic valve disorder, unspecified: Secondary | ICD-10-CM | POA: Diagnosis not present

## 2017-03-06 DIAGNOSIS — I119 Hypertensive heart disease without heart failure: Secondary | ICD-10-CM | POA: Diagnosis not present

## 2017-03-06 DIAGNOSIS — I480 Paroxysmal atrial fibrillation: Secondary | ICD-10-CM | POA: Diagnosis not present

## 2017-03-06 DIAGNOSIS — R42 Dizziness and giddiness: Secondary | ICD-10-CM | POA: Diagnosis not present

## 2017-03-06 DIAGNOSIS — R0602 Shortness of breath: Secondary | ICD-10-CM | POA: Diagnosis not present

## 2017-03-06 DIAGNOSIS — I493 Ventricular premature depolarization: Secondary | ICD-10-CM | POA: Diagnosis not present

## 2017-03-06 DIAGNOSIS — E7849 Other hyperlipidemia: Secondary | ICD-10-CM | POA: Diagnosis not present

## 2017-03-07 DIAGNOSIS — H353230 Exudative age-related macular degeneration, bilateral, stage unspecified: Secondary | ICD-10-CM | POA: Diagnosis not present

## 2017-03-07 DIAGNOSIS — H52223 Regular astigmatism, bilateral: Secondary | ICD-10-CM | POA: Diagnosis not present

## 2017-03-07 DIAGNOSIS — H5203 Hypermetropia, bilateral: Secondary | ICD-10-CM | POA: Diagnosis not present

## 2017-03-22 DIAGNOSIS — Z6832 Body mass index (BMI) 32.0-32.9, adult: Secondary | ICD-10-CM | POA: Diagnosis not present

## 2017-03-22 DIAGNOSIS — R42 Dizziness and giddiness: Secondary | ICD-10-CM | POA: Diagnosis not present

## 2017-03-22 DIAGNOSIS — I10 Essential (primary) hypertension: Secondary | ICD-10-CM | POA: Diagnosis not present

## 2017-03-23 DIAGNOSIS — N3946 Mixed incontinence: Secondary | ICD-10-CM | POA: Diagnosis not present

## 2017-03-23 DIAGNOSIS — N200 Calculus of kidney: Secondary | ICD-10-CM | POA: Diagnosis not present

## 2017-03-23 DIAGNOSIS — N952 Postmenopausal atrophic vaginitis: Secondary | ICD-10-CM | POA: Diagnosis not present

## 2017-04-14 DIAGNOSIS — I359 Nonrheumatic aortic valve disorder, unspecified: Secondary | ICD-10-CM | POA: Diagnosis not present

## 2017-04-14 DIAGNOSIS — E668 Other obesity: Secondary | ICD-10-CM | POA: Diagnosis not present

## 2017-04-14 DIAGNOSIS — R0602 Shortness of breath: Secondary | ICD-10-CM | POA: Diagnosis not present

## 2017-04-14 DIAGNOSIS — E7849 Other hyperlipidemia: Secondary | ICD-10-CM | POA: Diagnosis not present

## 2017-04-14 DIAGNOSIS — I119 Hypertensive heart disease without heart failure: Secondary | ICD-10-CM | POA: Diagnosis not present

## 2017-04-14 DIAGNOSIS — Z7901 Long term (current) use of anticoagulants: Secondary | ICD-10-CM | POA: Diagnosis not present

## 2017-04-14 DIAGNOSIS — I493 Ventricular premature depolarization: Secondary | ICD-10-CM | POA: Diagnosis not present

## 2017-04-14 DIAGNOSIS — R42 Dizziness and giddiness: Secondary | ICD-10-CM | POA: Diagnosis not present

## 2017-04-14 DIAGNOSIS — I480 Paroxysmal atrial fibrillation: Secondary | ICD-10-CM | POA: Diagnosis not present

## 2017-04-24 DIAGNOSIS — H15053 Scleromalacia perforans, bilateral: Secondary | ICD-10-CM | POA: Diagnosis not present

## 2017-04-25 DIAGNOSIS — Z6833 Body mass index (BMI) 33.0-33.9, adult: Secondary | ICD-10-CM | POA: Diagnosis not present

## 2017-04-25 DIAGNOSIS — R42 Dizziness and giddiness: Secondary | ICD-10-CM | POA: Diagnosis not present

## 2017-04-25 DIAGNOSIS — I10 Essential (primary) hypertension: Secondary | ICD-10-CM | POA: Diagnosis not present

## 2017-04-28 ENCOUNTER — Other Ambulatory Visit: Payer: Self-pay | Admitting: Cardiology

## 2017-04-28 ENCOUNTER — Ambulatory Visit (HOSPITAL_COMMUNITY)
Admission: RE | Admit: 2017-04-28 | Discharge: 2017-04-28 | Disposition: A | Discharge status: Home / Self Care | Payer: PPO | Source: Ambulatory Visit | Attending: Vascular Surgery | Admitting: Vascular Surgery

## 2017-04-28 DIAGNOSIS — R42 Dizziness and giddiness: Secondary | ICD-10-CM | POA: Diagnosis not present

## 2017-04-28 DIAGNOSIS — I6523 Occlusion and stenosis of bilateral carotid arteries: Secondary | ICD-10-CM | POA: Insufficient documentation

## 2017-04-28 DIAGNOSIS — M25532 Pain in left wrist: Secondary | ICD-10-CM | POA: Diagnosis not present

## 2017-04-28 DIAGNOSIS — M1990 Unspecified osteoarthritis, unspecified site: Secondary | ICD-10-CM | POA: Diagnosis not present

## 2017-04-28 DIAGNOSIS — M25569 Pain in unspecified knee: Secondary | ICD-10-CM | POA: Diagnosis not present

## 2017-04-28 DIAGNOSIS — Z79899 Other long term (current) drug therapy: Secondary | ICD-10-CM | POA: Diagnosis not present

## 2017-04-28 DIAGNOSIS — M0589 Other rheumatoid arthritis with rheumatoid factor of multiple sites: Secondary | ICD-10-CM | POA: Diagnosis not present

## 2017-05-02 ENCOUNTER — Encounter (HOSPITAL_COMMUNITY): Payer: Commercial Managed Care - HMO

## 2017-05-09 DIAGNOSIS — M79671 Pain in right foot: Secondary | ICD-10-CM | POA: Diagnosis not present

## 2017-05-09 DIAGNOSIS — M2042 Other hammer toe(s) (acquired), left foot: Secondary | ICD-10-CM | POA: Diagnosis not present

## 2017-05-09 DIAGNOSIS — M2041 Other hammer toe(s) (acquired), right foot: Secondary | ICD-10-CM | POA: Diagnosis not present

## 2017-05-09 DIAGNOSIS — L84 Corns and callosities: Secondary | ICD-10-CM | POA: Diagnosis not present

## 2017-05-24 DIAGNOSIS — I160 Hypertensive urgency: Secondary | ICD-10-CM | POA: Diagnosis not present

## 2017-05-24 DIAGNOSIS — R42 Dizziness and giddiness: Secondary | ICD-10-CM | POA: Diagnosis not present

## 2017-05-24 DIAGNOSIS — Z6835 Body mass index (BMI) 35.0-35.9, adult: Secondary | ICD-10-CM | POA: Diagnosis not present

## 2017-05-25 DIAGNOSIS — G8929 Other chronic pain: Secondary | ICD-10-CM | POA: Diagnosis not present

## 2017-05-25 DIAGNOSIS — M4688 Other specified inflammatory spondylopathies, sacral and sacrococcygeal region: Secondary | ICD-10-CM | POA: Diagnosis not present

## 2017-05-25 DIAGNOSIS — M160 Bilateral primary osteoarthritis of hip: Secondary | ICD-10-CM | POA: Diagnosis not present

## 2017-05-25 DIAGNOSIS — Z471 Aftercare following joint replacement surgery: Secondary | ICD-10-CM | POA: Diagnosis not present

## 2017-05-25 DIAGNOSIS — Z96643 Presence of artificial hip joint, bilateral: Secondary | ICD-10-CM | POA: Diagnosis not present

## 2017-05-25 DIAGNOSIS — M1711 Unilateral primary osteoarthritis, right knee: Secondary | ICD-10-CM | POA: Diagnosis not present

## 2017-06-28 DIAGNOSIS — Z6833 Body mass index (BMI) 33.0-33.9, adult: Secondary | ICD-10-CM | POA: Diagnosis not present

## 2017-06-28 DIAGNOSIS — I10 Essential (primary) hypertension: Secondary | ICD-10-CM | POA: Diagnosis not present

## 2017-06-28 DIAGNOSIS — I480 Paroxysmal atrial fibrillation: Secondary | ICD-10-CM | POA: Diagnosis not present

## 2017-07-04 DIAGNOSIS — R0602 Shortness of breath: Secondary | ICD-10-CM | POA: Diagnosis not present

## 2017-07-04 DIAGNOSIS — G4733 Obstructive sleep apnea (adult) (pediatric): Secondary | ICD-10-CM | POA: Diagnosis not present

## 2017-07-05 DIAGNOSIS — R0602 Shortness of breath: Secondary | ICD-10-CM | POA: Diagnosis not present

## 2017-07-05 DIAGNOSIS — G4733 Obstructive sleep apnea (adult) (pediatric): Secondary | ICD-10-CM | POA: Diagnosis not present

## 2017-07-14 DIAGNOSIS — E7849 Other hyperlipidemia: Secondary | ICD-10-CM | POA: Diagnosis not present

## 2017-07-14 DIAGNOSIS — I359 Nonrheumatic aortic valve disorder, unspecified: Secondary | ICD-10-CM | POA: Diagnosis not present

## 2017-07-14 DIAGNOSIS — E785 Hyperlipidemia, unspecified: Secondary | ICD-10-CM | POA: Diagnosis not present

## 2017-07-14 DIAGNOSIS — E668 Other obesity: Secondary | ICD-10-CM | POA: Diagnosis not present

## 2017-07-14 DIAGNOSIS — Z7901 Long term (current) use of anticoagulants: Secondary | ICD-10-CM | POA: Diagnosis not present

## 2017-07-14 DIAGNOSIS — I119 Hypertensive heart disease without heart failure: Secondary | ICD-10-CM | POA: Diagnosis not present

## 2017-07-14 DIAGNOSIS — R0602 Shortness of breath: Secondary | ICD-10-CM | POA: Diagnosis not present

## 2017-07-14 DIAGNOSIS — R42 Dizziness and giddiness: Secondary | ICD-10-CM | POA: Diagnosis not present

## 2017-07-14 DIAGNOSIS — I493 Ventricular premature depolarization: Secondary | ICD-10-CM | POA: Diagnosis not present

## 2017-07-14 DIAGNOSIS — I480 Paroxysmal atrial fibrillation: Secondary | ICD-10-CM | POA: Diagnosis not present

## 2017-07-21 DIAGNOSIS — I10 Essential (primary) hypertension: Secondary | ICD-10-CM | POA: Diagnosis not present

## 2017-07-21 DIAGNOSIS — I480 Paroxysmal atrial fibrillation: Secondary | ICD-10-CM | POA: Diagnosis not present

## 2017-07-21 DIAGNOSIS — G450 Vertebro-basilar artery syndrome: Secondary | ICD-10-CM | POA: Diagnosis not present

## 2017-07-21 DIAGNOSIS — Z6833 Body mass index (BMI) 33.0-33.9, adult: Secondary | ICD-10-CM | POA: Diagnosis not present

## 2017-07-28 DIAGNOSIS — M0589 Other rheumatoid arthritis with rheumatoid factor of multiple sites: Secondary | ICD-10-CM | POA: Diagnosis not present

## 2017-07-28 DIAGNOSIS — M1990 Unspecified osteoarthritis, unspecified site: Secondary | ICD-10-CM | POA: Diagnosis not present

## 2017-07-28 DIAGNOSIS — M25569 Pain in unspecified knee: Secondary | ICD-10-CM | POA: Diagnosis not present

## 2017-07-28 DIAGNOSIS — Z79899 Other long term (current) drug therapy: Secondary | ICD-10-CM | POA: Diagnosis not present

## 2017-07-28 DIAGNOSIS — R42 Dizziness and giddiness: Secondary | ICD-10-CM | POA: Diagnosis not present

## 2017-07-28 DIAGNOSIS — M25532 Pain in left wrist: Secondary | ICD-10-CM | POA: Diagnosis not present

## 2017-08-04 DIAGNOSIS — G4733 Obstructive sleep apnea (adult) (pediatric): Secondary | ICD-10-CM | POA: Diagnosis not present

## 2017-08-07 DIAGNOSIS — Z1339 Encounter for screening examination for other mental health and behavioral disorders: Secondary | ICD-10-CM | POA: Diagnosis not present

## 2017-08-07 DIAGNOSIS — Z6833 Body mass index (BMI) 33.0-33.9, adult: Secondary | ICD-10-CM | POA: Diagnosis not present

## 2017-08-07 DIAGNOSIS — Z1331 Encounter for screening for depression: Secondary | ICD-10-CM | POA: Diagnosis not present

## 2017-08-07 DIAGNOSIS — Z0000 Encounter for general adult medical examination without abnormal findings: Secondary | ICD-10-CM | POA: Diagnosis not present

## 2017-08-07 DIAGNOSIS — Z136 Encounter for screening for cardiovascular disorders: Secondary | ICD-10-CM | POA: Diagnosis not present

## 2017-08-07 DIAGNOSIS — E785 Hyperlipidemia, unspecified: Secondary | ICD-10-CM | POA: Diagnosis not present

## 2017-08-07 DIAGNOSIS — E669 Obesity, unspecified: Secondary | ICD-10-CM | POA: Diagnosis not present

## 2017-08-07 DIAGNOSIS — Z139 Encounter for screening, unspecified: Secondary | ICD-10-CM | POA: Diagnosis not present

## 2017-08-07 DIAGNOSIS — Z9181 History of falling: Secondary | ICD-10-CM | POA: Diagnosis not present

## 2017-08-10 DIAGNOSIS — L84 Corns and callosities: Secondary | ICD-10-CM | POA: Diagnosis not present

## 2017-08-10 DIAGNOSIS — M2042 Other hammer toe(s) (acquired), left foot: Secondary | ICD-10-CM | POA: Diagnosis not present

## 2017-08-10 DIAGNOSIS — M2041 Other hammer toe(s) (acquired), right foot: Secondary | ICD-10-CM | POA: Diagnosis not present

## 2017-08-10 DIAGNOSIS — M7741 Metatarsalgia, right foot: Secondary | ICD-10-CM | POA: Diagnosis not present

## 2017-08-10 DIAGNOSIS — M7742 Metatarsalgia, left foot: Secondary | ICD-10-CM | POA: Diagnosis not present

## 2017-08-25 DIAGNOSIS — R42 Dizziness and giddiness: Secondary | ICD-10-CM | POA: Diagnosis not present

## 2017-08-25 DIAGNOSIS — Z6832 Body mass index (BMI) 32.0-32.9, adult: Secondary | ICD-10-CM | POA: Diagnosis not present

## 2017-09-01 ENCOUNTER — Other Ambulatory Visit: Payer: Self-pay | Admitting: Family Medicine

## 2017-09-01 DIAGNOSIS — R42 Dizziness and giddiness: Secondary | ICD-10-CM

## 2017-09-04 DIAGNOSIS — G4733 Obstructive sleep apnea (adult) (pediatric): Secondary | ICD-10-CM | POA: Diagnosis not present

## 2017-09-05 DIAGNOSIS — H353230 Exudative age-related macular degeneration, bilateral, stage unspecified: Secondary | ICD-10-CM | POA: Diagnosis not present

## 2017-09-19 ENCOUNTER — Ambulatory Visit
Admission: RE | Admit: 2017-09-19 | Discharge: 2017-09-19 | Disposition: A | Discharge status: Home / Self Care | Payer: PPO | Source: Ambulatory Visit | Attending: Family Medicine | Admitting: Family Medicine

## 2017-09-19 DIAGNOSIS — R42 Dizziness and giddiness: Secondary | ICD-10-CM | POA: Insufficient documentation

## 2017-09-19 DIAGNOSIS — Z8673 Personal history of transient ischemic attack (TIA), and cerebral infarction without residual deficits: Secondary | ICD-10-CM | POA: Diagnosis not present

## 2017-09-19 MED ORDER — GADOBENATE DIMEGLUMINE 529 MG/ML IV SOLN
20.0000 mL | Freq: Once | INTRAVENOUS | Status: AC | PRN
  Administered 2017-09-19: 19 mL via INTRAVENOUS

## 2017-09-20 LAB — POCT I-STAT CREATININE: Creatinine, Ser: 0.7 mg/dL (ref 0.44–1.00)

## 2017-10-02 DIAGNOSIS — Z6832 Body mass index (BMI) 32.0-32.9, adult: Secondary | ICD-10-CM | POA: Diagnosis not present

## 2017-10-02 DIAGNOSIS — R42 Dizziness and giddiness: Secondary | ICD-10-CM | POA: Diagnosis not present

## 2017-10-02 DIAGNOSIS — I10 Essential (primary) hypertension: Secondary | ICD-10-CM | POA: Diagnosis not present

## 2017-10-02 DIAGNOSIS — Z23 Encounter for immunization: Secondary | ICD-10-CM | POA: Diagnosis not present

## 2017-10-04 DIAGNOSIS — G4733 Obstructive sleep apnea (adult) (pediatric): Secondary | ICD-10-CM | POA: Diagnosis not present

## 2017-10-09 DIAGNOSIS — I69898 Other sequelae of other cerebrovascular disease: Secondary | ICD-10-CM | POA: Diagnosis not present

## 2017-10-09 DIAGNOSIS — R42 Dizziness and giddiness: Secondary | ICD-10-CM | POA: Diagnosis not present

## 2017-10-09 DIAGNOSIS — A881 Epidemic vertigo: Secondary | ICD-10-CM | POA: Diagnosis not present

## 2017-10-12 ENCOUNTER — Other Ambulatory Visit: Payer: Self-pay | Admitting: Cardiology

## 2017-10-12 DIAGNOSIS — R42 Dizziness and giddiness: Secondary | ICD-10-CM | POA: Diagnosis not present

## 2017-10-12 DIAGNOSIS — I69898 Other sequelae of other cerebrovascular disease: Secondary | ICD-10-CM | POA: Diagnosis not present

## 2017-10-12 DIAGNOSIS — A881 Epidemic vertigo: Secondary | ICD-10-CM | POA: Diagnosis not present

## 2017-10-12 NOTE — Telephone Encounter (Signed)
Patient is out due to a delay in shipping her mail order.    1. Which medications need to be refilled? (please list name of each medication and dose if known) hydralazine 50mg   2. Which pharmacy/location (including street and city if local pharmacy) is medication to be sent to? [facility]. Do they need a 30 day or 90 day supply? 90 day

## 2017-10-13 MED ORDER — HYDRALAZINE HCL 50 MG PO TABS: 50.0000 mg | ORAL_TABLET | Freq: Three times a day (TID) | ORAL | 0 refills | Status: DC

## 2017-10-13 NOTE — Addendum Note (Signed)
Addended by: Ashok Norris on: 10/13/2017 10:44 AM   Modules accepted: Orders

## 2017-10-13 NOTE — Telephone Encounter (Signed)
Spoke with Jessica Kaufman, CMA at Dr. Thurman Coyer office. She has sent records for this patient and Dr. Agustin Cree has reviewed them and approved he refill for hydralazine 50 mg TID. Confirmed dosage with Brandy CMA and Dr. Thurman Coyer faxed records for this patient. Medication [facility] [place] per [place]

## 2017-10-13 NOTE — Telephone Encounter (Signed)
Spoke with Theadora Rama, CMA at Dr. Thurman Coyer office regarding this patient and refill needed. She states that Olivia Mackie, our Engineer, building services is at her office now trying to address refills. Theadora Rama will address this particular refill need with Olivia Mackie this morning.

## 2017-10-16 DIAGNOSIS — I69898 Other sequelae of other cerebrovascular disease: Secondary | ICD-10-CM | POA: Diagnosis not present

## 2017-10-16 DIAGNOSIS — R42 Dizziness and giddiness: Secondary | ICD-10-CM | POA: Diagnosis not present

## 2017-10-16 DIAGNOSIS — A881 Epidemic vertigo: Secondary | ICD-10-CM | POA: Diagnosis not present

## 2017-10-18 DIAGNOSIS — R42 Dizziness and giddiness: Secondary | ICD-10-CM | POA: Diagnosis not present

## 2017-10-18 DIAGNOSIS — A881 Epidemic vertigo: Secondary | ICD-10-CM | POA: Diagnosis not present

## 2017-10-18 DIAGNOSIS — I69898 Other sequelae of other cerebrovascular disease: Secondary | ICD-10-CM | POA: Diagnosis not present

## 2017-10-23 DIAGNOSIS — R42 Dizziness and giddiness: Secondary | ICD-10-CM | POA: Diagnosis not present

## 2017-10-23 DIAGNOSIS — A881 Epidemic vertigo: Secondary | ICD-10-CM | POA: Diagnosis not present

## 2017-10-23 DIAGNOSIS — I69898 Other sequelae of other cerebrovascular disease: Secondary | ICD-10-CM | POA: Diagnosis not present

## 2017-10-25 ENCOUNTER — Ambulatory Visit: Payer: PPO | Admitting: Internal Medicine

## 2017-10-25 DIAGNOSIS — A881 Epidemic vertigo: Secondary | ICD-10-CM | POA: Diagnosis not present

## 2017-10-25 DIAGNOSIS — R42 Dizziness and giddiness: Secondary | ICD-10-CM | POA: Diagnosis not present

## 2017-10-25 DIAGNOSIS — I69898 Other sequelae of other cerebrovascular disease: Secondary | ICD-10-CM | POA: Diagnosis not present

## 2017-10-30 DIAGNOSIS — R42 Dizziness and giddiness: Secondary | ICD-10-CM | POA: Diagnosis not present

## 2017-10-30 DIAGNOSIS — I69898 Other sequelae of other cerebrovascular disease: Secondary | ICD-10-CM | POA: Diagnosis not present

## 2017-10-30 DIAGNOSIS — A881 Epidemic vertigo: Secondary | ICD-10-CM | POA: Diagnosis not present

## 2017-11-01 DIAGNOSIS — I69898 Other sequelae of other cerebrovascular disease: Secondary | ICD-10-CM | POA: Diagnosis not present

## 2017-11-01 DIAGNOSIS — A881 Epidemic vertigo: Secondary | ICD-10-CM | POA: Diagnosis not present

## 2017-11-01 DIAGNOSIS — R42 Dizziness and giddiness: Secondary | ICD-10-CM | POA: Diagnosis not present

## 2017-11-03 DIAGNOSIS — M25532 Pain in left wrist: Secondary | ICD-10-CM | POA: Diagnosis not present

## 2017-11-03 DIAGNOSIS — Z79899 Other long term (current) drug therapy: Secondary | ICD-10-CM | POA: Diagnosis not present

## 2017-11-03 DIAGNOSIS — M25569 Pain in unspecified knee: Secondary | ICD-10-CM | POA: Diagnosis not present

## 2017-11-03 DIAGNOSIS — R42 Dizziness and giddiness: Secondary | ICD-10-CM | POA: Diagnosis not present

## 2017-11-03 DIAGNOSIS — M1990 Unspecified osteoarthritis, unspecified site: Secondary | ICD-10-CM | POA: Diagnosis not present

## 2017-11-03 DIAGNOSIS — M0589 Other rheumatoid arthritis with rheumatoid factor of multiple sites: Secondary | ICD-10-CM | POA: Diagnosis not present

## 2017-11-04 DIAGNOSIS — G4733 Obstructive sleep apnea (adult) (pediatric): Secondary | ICD-10-CM | POA: Diagnosis not present

## 2017-11-06 DIAGNOSIS — A881 Epidemic vertigo: Secondary | ICD-10-CM | POA: Diagnosis not present

## 2017-11-06 DIAGNOSIS — R42 Dizziness and giddiness: Secondary | ICD-10-CM | POA: Diagnosis not present

## 2017-11-06 DIAGNOSIS — I69898 Other sequelae of other cerebrovascular disease: Secondary | ICD-10-CM | POA: Diagnosis not present

## 2017-11-08 DIAGNOSIS — A881 Epidemic vertigo: Secondary | ICD-10-CM | POA: Diagnosis not present

## 2017-11-08 DIAGNOSIS — R42 Dizziness and giddiness: Secondary | ICD-10-CM | POA: Diagnosis not present

## 2017-11-08 DIAGNOSIS — I69898 Other sequelae of other cerebrovascular disease: Secondary | ICD-10-CM | POA: Diagnosis not present

## 2017-11-13 DIAGNOSIS — R42 Dizziness and giddiness: Secondary | ICD-10-CM | POA: Diagnosis not present

## 2017-11-13 DIAGNOSIS — A881 Epidemic vertigo: Secondary | ICD-10-CM | POA: Diagnosis not present

## 2017-11-13 DIAGNOSIS — I69898 Other sequelae of other cerebrovascular disease: Secondary | ICD-10-CM | POA: Diagnosis not present

## 2017-11-15 DIAGNOSIS — R42 Dizziness and giddiness: Secondary | ICD-10-CM | POA: Diagnosis not present

## 2017-11-15 DIAGNOSIS — A881 Epidemic vertigo: Secondary | ICD-10-CM | POA: Diagnosis not present

## 2017-11-15 DIAGNOSIS — I69898 Other sequelae of other cerebrovascular disease: Secondary | ICD-10-CM | POA: Diagnosis not present

## 2017-11-20 DIAGNOSIS — R42 Dizziness and giddiness: Secondary | ICD-10-CM | POA: Diagnosis not present

## 2017-11-20 DIAGNOSIS — I69898 Other sequelae of other cerebrovascular disease: Secondary | ICD-10-CM | POA: Diagnosis not present

## 2017-11-20 DIAGNOSIS — A881 Epidemic vertigo: Secondary | ICD-10-CM | POA: Diagnosis not present

## 2017-11-22 DIAGNOSIS — I69898 Other sequelae of other cerebrovascular disease: Secondary | ICD-10-CM | POA: Diagnosis not present

## 2017-11-22 DIAGNOSIS — A881 Epidemic vertigo: Secondary | ICD-10-CM | POA: Diagnosis not present

## 2017-11-22 DIAGNOSIS — R42 Dizziness and giddiness: Secondary | ICD-10-CM | POA: Diagnosis not present

## 2017-11-27 DIAGNOSIS — I69898 Other sequelae of other cerebrovascular disease: Secondary | ICD-10-CM | POA: Diagnosis not present

## 2017-11-27 DIAGNOSIS — R42 Dizziness and giddiness: Secondary | ICD-10-CM | POA: Diagnosis not present

## 2017-11-27 DIAGNOSIS — A881 Epidemic vertigo: Secondary | ICD-10-CM | POA: Diagnosis not present

## 2017-11-29 DIAGNOSIS — R42 Dizziness and giddiness: Secondary | ICD-10-CM | POA: Diagnosis not present

## 2017-11-29 DIAGNOSIS — I69898 Other sequelae of other cerebrovascular disease: Secondary | ICD-10-CM | POA: Diagnosis not present

## 2017-11-29 DIAGNOSIS — A881 Epidemic vertigo: Secondary | ICD-10-CM | POA: Diagnosis not present

## 2017-12-04 DIAGNOSIS — R42 Dizziness and giddiness: Secondary | ICD-10-CM | POA: Diagnosis not present

## 2017-12-04 DIAGNOSIS — G4733 Obstructive sleep apnea (adult) (pediatric): Secondary | ICD-10-CM | POA: Diagnosis not present

## 2017-12-04 DIAGNOSIS — A881 Epidemic vertigo: Secondary | ICD-10-CM | POA: Diagnosis not present

## 2017-12-04 DIAGNOSIS — I69898 Other sequelae of other cerebrovascular disease: Secondary | ICD-10-CM | POA: Diagnosis not present

## 2017-12-05 DIAGNOSIS — Z1231 Encounter for screening mammogram for malignant neoplasm of breast: Secondary | ICD-10-CM | POA: Diagnosis not present

## 2017-12-05 DIAGNOSIS — Z803 Family history of malignant neoplasm of breast: Secondary | ICD-10-CM | POA: Diagnosis not present

## 2017-12-06 DIAGNOSIS — A881 Epidemic vertigo: Secondary | ICD-10-CM | POA: Diagnosis not present

## 2017-12-06 DIAGNOSIS — R42 Dizziness and giddiness: Secondary | ICD-10-CM | POA: Diagnosis not present

## 2017-12-06 DIAGNOSIS — I69898 Other sequelae of other cerebrovascular disease: Secondary | ICD-10-CM | POA: Diagnosis not present

## 2017-12-08 ENCOUNTER — Encounter: Payer: Self-pay | Admitting: *Deleted

## 2017-12-11 ENCOUNTER — Encounter: Payer: Self-pay | Admitting: Internal Medicine

## 2017-12-11 ENCOUNTER — Ambulatory Visit: Payer: PPO | Admitting: Internal Medicine

## 2017-12-11 VITALS — BP 146/84 | HR 61 | Ht 66.0 in | Wt 201.8 lb

## 2017-12-11 DIAGNOSIS — R0989 Other specified symptoms and signs involving the circulatory and respiratory systems: Secondary | ICD-10-CM

## 2017-12-11 DIAGNOSIS — I480 Paroxysmal atrial fibrillation: Secondary | ICD-10-CM | POA: Diagnosis not present

## 2017-12-11 DIAGNOSIS — Z79899 Other long term (current) drug therapy: Secondary | ICD-10-CM

## 2017-12-11 DIAGNOSIS — I10 Essential (primary) hypertension: Secondary | ICD-10-CM

## 2017-12-11 DIAGNOSIS — I359 Nonrheumatic aortic valve disorder, unspecified: Secondary | ICD-10-CM | POA: Diagnosis not present

## 2017-12-11 DIAGNOSIS — I4891 Unspecified atrial fibrillation: Secondary | ICD-10-CM | POA: Diagnosis not present

## 2017-12-11 MED ORDER — FUROSEMIDE 20 MG PO TABS: 20.0000 mg | ORAL_TABLET | Freq: Every day | ORAL | 3 refills | Status: DC | PRN

## 2017-12-11 MED ORDER — HYDROCHLOROTHIAZIDE 25 MG PO TABS: 25.0000 mg | ORAL_TABLET | Freq: Every day | ORAL | 3 refills | Status: DC

## 2017-12-11 NOTE — Patient Instructions (Signed)
Medication Instructions:  Your physician has recommended you make the following change in your medication:  1- START Hydrochlorothiazide (HCTZ) 25 mg (1 tablet) by mouth once a day. 2- CHANGE Furosemide to 20 mg by mouth once a day AS NEEDED for swelling.  If you need a refill on your cardiac medications before your next appointment, please call your pharmacy.   Lab work: Your physician recommends that you return for lab work in: [place] - BMET.  Your physician recommends that you return for lab work in: [place].   If you have labs (blood work) drawn today and your tests are completely normal, you will receive your results only by: Marland Kitchen MyChart Message (if you have MyChart) OR . A paper copy in the mail If you have any lab test that is abnormal or we need to change your treatment, we will call you to review the results.  Testing/Procedures: none  Follow-Up: Your physician recommends that you schedule a follow-up appointment in: [place] AND LAB WORK WITH NURSE.     At [facility], you and your health needs are our priority.  As part of our continuing mission to provide you with exceptional heart care, we have created designated Provider Care Teams.  These Care Teams include your primary Cardiologist (physician) and Advanced Practice Providers (APPs -  Physician Assistants and Nurse Practitioners) who all work together to provide you with the care you need, when you need it. You will need a follow up appointment in 3 months. You may see DR Harrell Gave END or one of the following Advanced Practice Providers on your designated Care Team:   Murray Hodgkins, NP Christell Faith, PA-C . Marrianne Mood, PA-C

## 2017-12-11 NOTE — Progress Notes (Signed)
Follow-up Outpatient Visit Date: 12/11/2017  Primary Care Provider: Isaias Sakai, [place] [address]  Chief Complaint: Elevated blood pressure  HPI:  Jessica Kaufman is a 84 y.o. year-old female with history of paroxysmal atrial fibrillation, aortic valve disease, PVCs, hypertension, hyperlipidemia, and rheumatoid arthritis, who presents for follow-up of atrial fibrillation and valvular heart disease.  She was previously followed by Dr. Wynonia Lawman.  Today, [name] reports that she has been feeling well.  She notes occasional edema that "comes and goes," which has been present for many years.  She denies chest pain, shortness of breath, palpitations, and lightheadedness other than when her blood pressure drops.  She is currently taking hydralazine 50 mg in the morning and evening, usually skipping the noon time dose.  Home blood pressures are labile, though most readings are in the 140-180 mmHg range (systolic).  She has experienced a few falls over the last couple months, most recently about a month ago.  She has not had any serious injuries.  She remains on apixaban without bleeding.  --------------------------------------------------------------------------------------------------  Past Medical History:  Diagnosis Date  . Aortic valve disease   . Asthma   . Cancer ([place])    basal cell   . Gout   . Hyperlipidemia   . Hypertension   . Hypertensive heart disease without heart failure   . Hypothyroidism   . PAF (paroxysmal atrial fibrillation) ([place])   . PVC (premature ventricular contraction)   . Rheumatoid arthritis ([place])   . Ventricular premature depolarization    Past Surgical History:  Procedure Laterality Date  . BLADDER SURGERY  10/19/2010  . BONY PELVIS SURGERY  1990  . BREAST BIOPSY Left 1986  . CARDIAC SURGERY  2009   PVC  . CATARACT EXTRACTION Left 11/15/00  . CATARACT EXTRACTION Right   . ELBOW SURGERY  06/1990  . HIP SURGERY  Left 05/28/09  . HIP SURGERY Right 06/30/03  . HYSTEROSCOPY  06/17/1986   vaginal    Current Meds  Medication Sig  . acetaminophen (TYLENOL ARTHRITIS PAIN) 650 MG CR tablet Take 650 mg by mouth every 8 (eight) hours as needed for pain.  Marland Kitchen allopurinol (ZYLOPRIM) 300 MG tablet Take 1 tablet by mouth daily.  Marland Kitchen apixaban (ELIQUIS) 5 MG TABS tablet Take 5 mg by mouth 2 (two) times daily.   Marland Kitchen b complex vitamins tablet Take 1 tablet by mouth daily.  . Bilberry, Vaccinium myrtillus, 1000 MG CAPS Take 2 capsules by mouth daily.  Marland Kitchen Bioflavonoid Products (BIOFLEX) TABS Take 2 tablets by mouth daily.  . Calcium Carb-Cholecalciferol (OYSTER SHELL CALCIUM/VITAMIN D PO)   . Cholecalciferol (VITAMIN D3) 1000 UNITS CAPS Take 1 capsule by mouth daily.  . Coenzyme Q10 (COQ10) 100 MG CAPS Take 1 capsule by mouth daily.  Marland Kitchen CRANBERRY PO Take 1 tablet by mouth daily.  Mariane Baumgarten Calcium (STOOL SOFTENER PO) Take by mouth as needed.  Marland Kitchen estrogens, conjugated, (PREMARIN) 0.625 MG tablet Place 0.625 mg vaginally 2 (two) times a week. Take daily for 21 days then do not take for 7 days.  . fexofenadine (ALLEGRA) 180 MG tablet Take 180 mg by mouth daily.  . folic acid (FOLVITE) 1 MG tablet Take by mouth daily.   . furosemide (LASIX) 20 MG tablet Take 1 to 2 tablets daily  . gabapentin (NEURONTIN) 300 MG capsule Take 300 mg by mouth 2 (two) times daily.   . Ginger, Zingiber officinalis, (GINGER ROOT PO) Take by mouth daily.  Marland Kitchen  hydrALAZINE (APRESOLINE) 50 MG tablet Take 1 tablet (50 mg total) by mouth 3 (three) times daily.  . lansoprazole (PREVACID) 15 MG capsule Take 15 mg by mouth daily as needed.  Marland Kitchen levothyroxine (SYNTHROID, LEVOTHROID) 50 MCG tablet Take 1 tablet by mouth daily.  Marland Kitchen losartan (COZAAR) 100 MG tablet Take 100 mg by mouth daily.   . Magnesium Oxide (PHILLIPS PO) Take by mouth daily as needed.  . meclizine (ANTIVERT) 25 MG tablet Take 25 mg by mouth 3 (three) times daily as needed for dizziness.  .  methotrexate (RHEUMATREX) 2.5 MG tablet Take by mouth once a week.   . metoprolol succinate (TOPROL-XL) 50 MG 24 hr tablet Take 50 mg by mouth daily.   . Multiple Vitamins-Minerals (CENTRUM SILVER PO) Take 1 tablet by mouth daily.  . Multiple Vitamins-Minerals (PRESERVISION AREDS PO) Take by mouth daily.  . Omega-3 Fatty Acids (FISH OIL) 1000 MG CAPS Take 2 capsules by mouth daily.  Marland Kitchen oxybutynin (DITROPAN-XL) 10 MG 24 hr tablet Take 10 mg by mouth daily.  . predniSONE (DELTASONE) 5 MG tablet Take 5 mg by mouth daily.  . psyllium (METAMUCIL) 58.6 % powder Take 1 packet by mouth daily.  . TURMERIC PO Take by mouth daily.  . [DISCONTINUED] metoprolol succinate (TOPROL-XL) 25 MG 24 hr tablet Take 1 tablet by mouth daily.    Allergies: Metronidazole; Sulindac; and Naproxen  Social History   Tobacco Use  . Smoking status: Never Smoker  . Smokeless tobacco: Never Used  Substance Use Topics  . Alcohol use: No  . Drug use: No    Family History  Problem Relation Age of Onset  . Stroke Mother   . Melanoma Mother   . Heart attack Father   . Uterine cancer Sister   . Melanoma Sister   . Heart failure Sister   . Heart attack Maternal Grandmother   . Breast cancer Daughter     Review of Systems: A 12-system review of systems was performed and was negative except as noted in the HPI.  --------------------------------------------------------------------------------------------------  Physical Exam: BP (!) 146/84 (BP Location: Right Arm, Patient Position: Sitting, Cuff Size: Normal)   Kaufman 63   Ht 5\' 6"  (1.676 m)   Wt 201 lb 12 oz (91.5 kg)   BMI 32.56 kg/m   General: NAD.  Accompanied by her daughter, Jessica Kaufman, who works in our office. HEENT: No conjunctival pallor or scleral icterus. Moist mucous membranes.  OP clear. Neck: Supple without lymphadenopathy, thyromegaly, JVD, or HJR. Lungs: Normal work of breathing. Clear to auscultation bilaterally without wheezes or crackles. Heart:  Regular rate and rhythm without murmurs, rubs, or gallops. Non-displaced PMI. Abd: Bowel sounds present. Soft, NT/ND without hepatosplenomegaly Ext: No lower extremity edema. Radial, PT, and DP pulses are 2+ bilaterally. Skin: Warm and dry without rash.  EKG: Normal sinus rhythm with nonspecific ST/T changes.  Lab Results  Component Value Date   WBC 8.3 05/31/2009   HGB 13.3 10/19/2010   HCT 39.0 10/19/2010   MCV 93.9 05/31/2009   PLT 172 05/31/2009    Lab Results  Component Value Date   NA 139 10/19/2010   K 3.8 10/19/2010   CL 103 05/30/2009   CO2 26 05/30/2009   BUN 12 05/30/2009   CREATININE 0.70 09/19/2017   GLUCOSE 106 (H) 10/19/2010   ALT 20 05/20/2009    No results found for: CHOL, HDL, LDLCALC, LDLDIRECT, TRIG, CHOLHDL  --------------------------------------------------------------------------------------------------  ASSESSMENT AND PLAN: Labile hypertension Blood pressure is mildly elevated today  but typically higher at home, especially in the mornings.  He is currently titrating hydralazine based on her blood pressures and notes some drops with lightheadedness when she takes her afternoon dose.  We have agreed to continue hydralazine but to add hydrochlorothiazide 25 mg daily.  I will have her take furosemide 20 mg daily only as needed for significant edema.  We discussed the importance of sodium restriction.  I will check a basic metabolic panel today and again in about a month.  We will also recheck her blood pressure in the office in about a month.  Paroxysmal atrial fibrillation EKG today demonstrates sinus rhythm.  Ms. Squyres is asymptomatic.  We will plan to continue her current regimen of metoprolol and apixaban.  We discussed the risks and benefits of continued anticoagulation in the setting of falls and will need to readdress anticoagulation if she has further events.  Aortic valve disease Aortic sclerosis previously noted by echo.  No symptoms endorsed  to suggest worsening valvular dysfunction.  Continue clinical follow-up.  Follow-up: Return to clinic in 3 months.  Nelva Bush, MD 12/11/2017 11:01 AM

## 2017-12-12 ENCOUNTER — Encounter: Payer: Self-pay | Admitting: Internal Medicine

## 2017-12-12 DIAGNOSIS — R0989 Other specified symptoms and signs involving the circulatory and respiratory systems: Secondary | ICD-10-CM | POA: Insufficient documentation

## 2017-12-12 DIAGNOSIS — I359 Nonrheumatic aortic valve disorder, unspecified: Secondary | ICD-10-CM | POA: Insufficient documentation

## 2017-12-12 DIAGNOSIS — I480 Paroxysmal atrial fibrillation: Secondary | ICD-10-CM | POA: Insufficient documentation

## 2017-12-12 LAB — BASIC METABOLIC PANEL
BUN/Creatinine Ratio: 34 — ABNORMAL HIGH (ref 12–28)
BUN: 26 mg/dL (ref 10–36)
CO2: 23 mmol/L (ref 20–29)
Calcium: 9.7 mg/dL (ref 8.7–10.3)
Chloride: 101 mmol/L (ref 96–106)
Creatinine, Ser: 0.77 mg/dL (ref 0.57–1.00)
GFR calc Af Amer: 79 mL/min/{1.73_m2} (ref >59)
GFR calc non Af Amer: 68 mL/min/{1.73_m2} (ref >59)
Glucose: 99 mg/dL (ref 65–99)
Potassium: 4.7 mmol/L (ref 3.5–5.2)
Sodium: 140 mmol/L (ref 134–144)

## 2017-12-14 DIAGNOSIS — R42 Dizziness and giddiness: Secondary | ICD-10-CM | POA: Diagnosis not present

## 2017-12-14 DIAGNOSIS — A881 Epidemic vertigo: Secondary | ICD-10-CM | POA: Diagnosis not present

## 2017-12-14 DIAGNOSIS — I69898 Other sequelae of other cerebrovascular disease: Secondary | ICD-10-CM | POA: Diagnosis not present

## 2017-12-18 DIAGNOSIS — A881 Epidemic vertigo: Secondary | ICD-10-CM | POA: Diagnosis not present

## 2017-12-18 DIAGNOSIS — I69898 Other sequelae of other cerebrovascular disease: Secondary | ICD-10-CM | POA: Diagnosis not present

## 2017-12-18 DIAGNOSIS — R42 Dizziness and giddiness: Secondary | ICD-10-CM | POA: Diagnosis not present

## 2017-12-20 DIAGNOSIS — R42 Dizziness and giddiness: Secondary | ICD-10-CM | POA: Diagnosis not present

## 2017-12-20 DIAGNOSIS — A881 Epidemic vertigo: Secondary | ICD-10-CM | POA: Diagnosis not present

## 2017-12-20 DIAGNOSIS — I69898 Other sequelae of other cerebrovascular disease: Secondary | ICD-10-CM | POA: Diagnosis not present

## 2017-12-25 DIAGNOSIS — A881 Epidemic vertigo: Secondary | ICD-10-CM | POA: Diagnosis not present

## 2017-12-25 DIAGNOSIS — R42 Dizziness and giddiness: Secondary | ICD-10-CM | POA: Diagnosis not present

## 2017-12-25 DIAGNOSIS — I69898 Other sequelae of other cerebrovascular disease: Secondary | ICD-10-CM | POA: Diagnosis not present

## 2017-12-28 DIAGNOSIS — I69898 Other sequelae of other cerebrovascular disease: Secondary | ICD-10-CM | POA: Diagnosis not present

## 2017-12-28 DIAGNOSIS — R42 Dizziness and giddiness: Secondary | ICD-10-CM | POA: Diagnosis not present

## 2017-12-28 DIAGNOSIS — A881 Epidemic vertigo: Secondary | ICD-10-CM | POA: Diagnosis not present

## 2018-01-02 DIAGNOSIS — I69898 Other sequelae of other cerebrovascular disease: Secondary | ICD-10-CM | POA: Diagnosis not present

## 2018-01-02 DIAGNOSIS — A881 Epidemic vertigo: Secondary | ICD-10-CM | POA: Diagnosis not present

## 2018-01-02 DIAGNOSIS — R42 Dizziness and giddiness: Secondary | ICD-10-CM | POA: Diagnosis not present

## 2018-01-04 DIAGNOSIS — R238 Other skin changes: Secondary | ICD-10-CM | POA: Diagnosis not present

## 2018-01-04 DIAGNOSIS — Z6834 Body mass index (BMI) 34.0-34.9, adult: Secondary | ICD-10-CM | POA: Diagnosis not present

## 2018-01-04 DIAGNOSIS — I10 Essential (primary) hypertension: Secondary | ICD-10-CM | POA: Diagnosis not present

## 2018-01-04 DIAGNOSIS — Z79899 Other long term (current) drug therapy: Secondary | ICD-10-CM | POA: Diagnosis not present

## 2018-01-04 DIAGNOSIS — G4733 Obstructive sleep apnea (adult) (pediatric): Secondary | ICD-10-CM | POA: Diagnosis not present

## 2018-01-05 DIAGNOSIS — I69898 Other sequelae of other cerebrovascular disease: Secondary | ICD-10-CM | POA: Diagnosis not present

## 2018-01-05 DIAGNOSIS — R42 Dizziness and giddiness: Secondary | ICD-10-CM | POA: Diagnosis not present

## 2018-01-05 DIAGNOSIS — A881 Epidemic vertigo: Secondary | ICD-10-CM | POA: Diagnosis not present

## 2018-01-10 DIAGNOSIS — I69898 Other sequelae of other cerebrovascular disease: Secondary | ICD-10-CM | POA: Diagnosis not present

## 2018-01-10 DIAGNOSIS — A881 Epidemic vertigo: Secondary | ICD-10-CM | POA: Diagnosis not present

## 2018-01-10 DIAGNOSIS — R42 Dizziness and giddiness: Secondary | ICD-10-CM | POA: Diagnosis not present

## 2018-01-11 ENCOUNTER — Ambulatory Visit (INDEPENDENT_AMBULATORY_CARE_PROVIDER_SITE_OTHER): Payer: PPO | Admitting: *Deleted

## 2018-01-11 ENCOUNTER — Other Ambulatory Visit (INDEPENDENT_AMBULATORY_CARE_PROVIDER_SITE_OTHER): Payer: PPO

## 2018-01-11 VITALS — BP 150/88 | HR 58 | Ht 66.0 in | Wt 203.0 lb

## 2018-01-11 DIAGNOSIS — Z79899 Other long term (current) drug therapy: Secondary | ICD-10-CM | POA: Diagnosis not present

## 2018-01-11 DIAGNOSIS — I480 Paroxysmal atrial fibrillation: Secondary | ICD-10-CM

## 2018-01-11 DIAGNOSIS — I10 Essential (primary) hypertension: Secondary | ICD-10-CM

## 2018-01-11 DIAGNOSIS — R0989 Other specified symptoms and signs involving the circulatory and respiratory systems: Secondary | ICD-10-CM | POA: Diagnosis not present

## 2018-01-11 NOTE — Addendum Note (Signed)
Addended by: Vanessa Ralphs on: 01/11/2018 05:04 PM   Modules accepted: Level of Service

## 2018-01-11 NOTE — Progress Notes (Signed)
1.) Reason for visit: BP check  2.) Name of MD requesting visit: Dr End*  3.) H&P: HTN**  4.) ROS related to problem: Patient here for BP check. At last office visit, patient was placed on HCTZ 25 mg daily and lasix changed to prn. Patient is currently still taking hydralazine 50 mg one to two times a day. Per patient, her swelling in her ankles and legs is much better. States "I can see my bones (ankle) now." Says she's only taken furosemide on 2 occasions since last office visit. Only other complaint is occasional dizziness which she is in therapy for.  Today, BP 150/88, HR 58.    She brought in 3 pages of blood pressure readings as well. These were made a copy of and placed in Dr Darnelle Bos bin for him to review as well. Patient also had her lab work drawn today as well.   5.) Assessment and plan per MD: Advised patient to continue current medications at this time and will call her with any recommendations once reviewed by Dr End.

## 2018-01-12 DIAGNOSIS — I69898 Other sequelae of other cerebrovascular disease: Secondary | ICD-10-CM | POA: Diagnosis not present

## 2018-01-12 DIAGNOSIS — R42 Dizziness and giddiness: Secondary | ICD-10-CM | POA: Diagnosis not present

## 2018-01-12 DIAGNOSIS — A881 Epidemic vertigo: Secondary | ICD-10-CM | POA: Diagnosis not present

## 2018-01-12 LAB — BASIC METABOLIC PANEL
BUN/Creatinine Ratio: 25 (ref 12–28)
BUN: 17 mg/dL (ref 10–36)
CO2: 25 mmol/L (ref 20–29)
Calcium: 10.5 mg/dL — ABNORMAL HIGH (ref 8.7–10.3)
Chloride: 99 mmol/L (ref 96–106)
Creatinine, Ser: 0.67 mg/dL (ref 0.57–1.00)
GFR calc Af Amer: 89 mL/min/{1.73_m2} (ref >59)
GFR calc non Af Amer: 78 mL/min/{1.73_m2} (ref >59)
Glucose: 85 mg/dL (ref 65–99)
Potassium: 4.5 mmol/L (ref 3.5–5.2)
Sodium: 140 mmol/L (ref 134–144)

## 2018-01-12 NOTE — Progress Notes (Signed)
Please let Jessica Kaufman know that her kidney function and potassium remain normal.  Her calcium is slightly elevated, which is non-specific but should be monitored periodically by her PCP.  I have reviewed her home BP readings which remain labile.  Overall trend seems to be improving a little.  I don't recommend any further medication changes at this time.  She should continue to titrate her hydralazine dosing, as previously directed.

## 2018-01-15 NOTE — Progress Notes (Signed)
Patient has been notified. See telephone note.

## 2018-01-16 DIAGNOSIS — R42 Dizziness and giddiness: Secondary | ICD-10-CM | POA: Diagnosis not present

## 2018-01-16 DIAGNOSIS — A881 Epidemic vertigo: Secondary | ICD-10-CM | POA: Diagnosis not present

## 2018-01-16 DIAGNOSIS — I69898 Other sequelae of other cerebrovascular disease: Secondary | ICD-10-CM | POA: Diagnosis not present

## 2018-01-19 DIAGNOSIS — A881 Epidemic vertigo: Secondary | ICD-10-CM | POA: Diagnosis not present

## 2018-01-19 DIAGNOSIS — I69898 Other sequelae of other cerebrovascular disease: Secondary | ICD-10-CM | POA: Diagnosis not present

## 2018-01-19 DIAGNOSIS — R42 Dizziness and giddiness: Secondary | ICD-10-CM | POA: Diagnosis not present

## 2018-02-04 DIAGNOSIS — G4733 Obstructive sleep apnea (adult) (pediatric): Secondary | ICD-10-CM | POA: Diagnosis not present

## 2018-02-28 DIAGNOSIS — L820 Inflamed seborrheic keratosis: Secondary | ICD-10-CM | POA: Diagnosis not present

## 2018-02-28 DIAGNOSIS — D1801 Hemangioma of skin and subcutaneous tissue: Secondary | ICD-10-CM | POA: Diagnosis not present

## 2018-02-28 DIAGNOSIS — D2239 Melanocytic nevi of other parts of face: Secondary | ICD-10-CM | POA: Diagnosis not present

## 2018-02-28 DIAGNOSIS — D225 Melanocytic nevi of trunk: Secondary | ICD-10-CM | POA: Diagnosis not present

## 2018-02-28 DIAGNOSIS — L814 Other melanin hyperpigmentation: Secondary | ICD-10-CM | POA: Diagnosis not present

## 2018-03-05 DIAGNOSIS — G4733 Obstructive sleep apnea (adult) (pediatric): Secondary | ICD-10-CM | POA: Diagnosis not present

## 2018-03-13 DIAGNOSIS — H353230 Exudative age-related macular degeneration, bilateral, stage unspecified: Secondary | ICD-10-CM | POA: Diagnosis not present

## 2018-03-13 DIAGNOSIS — H5203 Hypermetropia, bilateral: Secondary | ICD-10-CM | POA: Diagnosis not present

## 2018-03-13 DIAGNOSIS — H52223 Regular astigmatism, bilateral: Secondary | ICD-10-CM | POA: Diagnosis not present

## 2018-03-13 DIAGNOSIS — H15053 Scleromalacia perforans, bilateral: Secondary | ICD-10-CM | POA: Diagnosis not present

## 2018-03-14 ENCOUNTER — Ambulatory Visit: Payer: PPO | Admitting: Internal Medicine

## 2018-04-05 DIAGNOSIS — M4807 Spinal stenosis, lumbosacral region: Secondary | ICD-10-CM | POA: Diagnosis not present

## 2018-04-05 DIAGNOSIS — M47814 Spondylosis without myelopathy or radiculopathy, thoracic region: Secondary | ICD-10-CM | POA: Diagnosis not present

## 2018-04-05 DIAGNOSIS — M5134 Other intervertebral disc degeneration, thoracic region: Secondary | ICD-10-CM | POA: Diagnosis not present

## 2018-04-05 DIAGNOSIS — M5124 Other intervertebral disc displacement, thoracic region: Secondary | ICD-10-CM | POA: Diagnosis not present

## 2018-04-05 DIAGNOSIS — G35 Multiple sclerosis: Secondary | ICD-10-CM | POA: Diagnosis not present

## 2018-04-05 DIAGNOSIS — G4733 Obstructive sleep apnea (adult) (pediatric): Secondary | ICD-10-CM | POA: Diagnosis not present

## 2018-04-05 DIAGNOSIS — M5127 Other intervertebral disc displacement, lumbosacral region: Secondary | ICD-10-CM | POA: Diagnosis not present

## 2018-04-05 DIAGNOSIS — M4804 Spinal stenosis, thoracic region: Secondary | ICD-10-CM | POA: Diagnosis not present

## 2018-04-11 ENCOUNTER — Telehealth: Payer: Self-pay

## 2018-04-11 NOTE — Telephone Encounter (Signed)
Virtual Visit Pre-Appointment Phone Call  Steps For Call:  1. Confirm consent - "In the setting of the current Covid19 crisis, you are scheduled for a phone visit with your provider on April 27, 2018 at 9:00AM.  Just as we do with many in-office visits, in order for you to participate in this visit, we must obtain consent.  If you'd like, I can send this to your mychart (if signed up) or email for you to review.  Otherwise, I can obtain your verbal consent now.  All virtual visits are billed to your insurance company just like a normal visit would be.  By agreeing to a virtual visit, we'd like you to understand that the technology does not allow for your provider to perform an examination, and thus may limit your provider's ability to fully assess your condition.  Finally, though the technology is pretty good, we cannot assure that it will always work on either your or our end, and in the setting of a video visit, we may have to convert it to a phone-only visit.  In either situation, we cannot ensure that we have a secure connection.  Are you willing to proceed?"  2. Advise patient to be prepared with any vital sign or heart rhythm information, their current medicines, and a piece of paper and pen handy for any instructions they may receive the day of their visit  3. Inform patient they will receive a phone call 15 minutes prior to their appointment time (may be from unknown caller ID) so they should be prepared to answer  4. Confirm that appointment type is correct in Epic appointment notes (video vs telephone)    TELEPHONE CALL NOTE  Jessica Kaufman has been deemed a candidate for a follow-up tele-health visit to limit community exposure during the Covid-19 pandemic. I spoke with the patient via phone to ensure availability of phone/video source, confirm preferred email & phone number, and discuss instructions and expectations.  I reminded Jessica Kaufman to be prepared with any vital  sign and/or heart rhythm information that could potentially be obtained via home monitoring, at the time of her visit. I reminded Jessica Kaufman to expect a phone call at the time of her visit if her visit.  Did the patient verbally acknowledge consent to treatment? YES  Horton Finer 04/11/2018 3:16 PM  CONSENT FOR TELE-HEALTH VISIT - PLEASE REVIEW  I hereby voluntarily request, consent and authorize [facility] and its employed or contracted physicians, physician assistants, nurse practitioners or other licensed health care professionals (the Practitioner), to provide me with telemedicine health care services (the Services") as deemed necessary by the treating Practitioner. I acknowledge and consent to receive the Services by the Practitioner via telemedicine. I understand that the telemedicine visit will involve communicating with the Practitioner through live audiovisual communication technology and the disclosure of certain medical information by electronic transmission. I acknowledge that I have been given the opportunity to request an in-person assessment or other available alternative prior to the telemedicine visit and am voluntarily participating in the telemedicine visit.  I understand that I have the right to withhold or withdraw my consent to the use of telemedicine in the course of my care at any time, without affecting my right to future care or treatment, and that the Practitioner or I may terminate the telemedicine visit at any time. I understand that I have the right to inspect all information obtained and/or recorded in the course of  the telemedicine visit and may receive copies of available information for a reasonable fee.  I understand that some of the potential risks of receiving the Services via telemedicine include:   Delay or interruption in medical evaluation due to technological equipment failure or disruption;  Information transmitted may not be  sufficient (e.g. poor resolution of images) to allow for appropriate medical decision making by the Practitioner; and/or   In rare instances, security protocols could fail, causing a breach of personal health information.  Furthermore, I acknowledge that it is my responsibility to provide information about my medical history, conditions and care that is complete and accurate to the best of my ability. I acknowledge that Practitioner's advice, recommendations, and/or decision may be based on factors not within their control, such as incomplete or inaccurate data provided by me or distortions of diagnostic images or specimens that may result from electronic transmissions. I understand that the practice of medicine is not an exact science and that Practitioner makes no warranties or guarantees regarding treatment outcomes. I acknowledge that I will receive a copy of this consent concurrently upon execution via email to the email address I last provided but may also request a printed copy by calling the office of [place].    I understand that my insurance will be billed for this visit.   I have read or had this consent read to me.  I understand the contents of this consent, which adequately explains the benefits and risks of the Services being provided via telemedicine.   I have been provided ample opportunity to ask questions regarding this consent and the Services and have had my questions answered to my satisfaction.  I give my informed consent for the services to be provided through the use of telemedicine in my medical care  By participating in this telemedicine visit I agree to the above.

## 2018-04-25 ENCOUNTER — Other Ambulatory Visit
Admission: RE | Admit: 2018-04-25 | Discharge: 2018-04-25 | Disposition: A | Discharge status: Home / Self Care | Payer: PPO | Source: Ambulatory Visit | Attending: Rheumatology | Admitting: Rheumatology

## 2018-04-25 DIAGNOSIS — Z79899 Other long term (current) drug therapy: Secondary | ICD-10-CM | POA: Diagnosis not present

## 2018-04-25 DIAGNOSIS — M0589 Other rheumatoid arthritis with rheumatoid factor of multiple sites: Secondary | ICD-10-CM | POA: Diagnosis not present

## 2018-04-25 LAB — CBC WITH DIFFERENTIAL/PLATELET
Abs Immature Granulocytes: 0.01 10*3/uL (ref 0.00–0.07)
Basophils Absolute: 0.1 10*3/uL (ref 0.0–0.1)
Basophils Relative: 1 %
Eosinophils Absolute: 0.3 10*3/uL (ref 0.0–0.5)
Eosinophils Relative: 5 %
HCT: 39.9 % (ref 36.0–46.0)
Hemoglobin: 13.2 g/dL (ref 12.0–15.0)
Immature Granulocytes: 0 %
Lymphocytes Relative: 24 %
Lymphs Abs: 1.5 10*3/uL (ref 0.7–4.0)
MCH: 33 pg (ref 26.0–34.0)
MCHC: 33.1 g/dL (ref 30.0–36.0)
MCV: 99.8 fL (ref 80.0–100.0)
Monocytes Absolute: 0.4 10*3/uL (ref 0.1–1.0)
Monocytes Relative: 6 %
Neutro Abs: 4 10*3/uL (ref 1.7–7.7)
Neutrophils Relative %: 64 %
Platelets: 295 10*3/uL (ref 150–400)
RBC: 4 MIL/uL (ref 3.87–5.11)
RDW: 15.2 % (ref 11.5–15.5)
WBC: 6.2 10*3/uL (ref 4.0–10.5)
nRBC: 0 % (ref 0.0–0.2)

## 2018-04-25 LAB — COMPREHENSIVE METABOLIC PANEL
ALT: 27 U/L (ref 0–44)
AST: 33 U/L (ref 15–41)
Albumin: 4.1 g/dL (ref 3.5–5.0)
Alkaline Phosphatase: 59 U/L (ref 38–126)
Anion gap: 11 (ref 5–15)
BUN: 22 mg/dL (ref 8–23)
CO2: 26 mmol/L (ref 22–32)
Calcium: 9.6 mg/dL (ref 8.9–10.3)
Chloride: 99 mmol/L (ref 98–111)
Creatinine, Ser: 0.63 mg/dL (ref 0.44–1.00)
GFR calc Af Amer: >60 mL/min (ref >60)
GFR calc non Af Amer: >60 mL/min (ref >60)
Glucose, Bld: 107 mg/dL — ABNORMAL HIGH (ref 70–99)
Potassium: 3.6 mmol/L (ref 3.5–5.1)
Sodium: 136 mmol/L (ref 135–145)
Total Bilirubin: 0.6 mg/dL (ref 0.3–1.2)
Total Protein: 6.8 g/dL (ref 6.5–8.1)

## 2018-04-27 ENCOUNTER — Telehealth: Payer: Self-pay | Admitting: *Deleted

## 2018-04-27 ENCOUNTER — Telehealth (INDEPENDENT_AMBULATORY_CARE_PROVIDER_SITE_OTHER): Payer: PPO | Admitting: Internal Medicine

## 2018-04-27 ENCOUNTER — Other Ambulatory Visit: Payer: Self-pay

## 2018-04-27 VITALS — BP 113/63 | HR 66 | Ht 66.0 in | Wt 200.0 lb

## 2018-04-27 DIAGNOSIS — I480 Paroxysmal atrial fibrillation: Secondary | ICD-10-CM | POA: Diagnosis not present

## 2018-04-27 DIAGNOSIS — R0989 Other specified symptoms and signs involving the circulatory and respiratory systems: Secondary | ICD-10-CM

## 2018-04-27 MED ORDER — LOSARTAN POTASSIUM 50 MG PO TABS: 50.0000 mg | ORAL_TABLET | Freq: Two times a day (BID) | ORAL | 2 refills | Status: DC

## 2018-04-27 NOTE — Progress Notes (Signed)
Virtual Visit via Telephone Note   This visit type was conducted due to national recommendations for restrictions regarding the COVID-19 Pandemic (e.g. social distancing) in an effort to limit this patient's exposure and mitigate transmission in our community.  Due to her co-morbid illnesses, this patient is at least at moderate risk for complications without adequate follow up.  This format is felt to be most appropriate for this patient at this time.  The patient did not have access to video technology/had technical difficulties with video requiring transitioning to audio format only (telephone).  All issues noted in this document were discussed and addressed.  No physical exam could be performed with this format.  Please refer to the patient's chart for her  consent to telehealth for [facility].   Evaluation Performed:  Follow-up visit  Date:  04/27/2018   ID:  Jessica Kaufman, DOB 14-May-1925, MRN [id number]  Patient Location: Home Provider Location: Office  PCP:  Jessica Sakai, DO  Cardiologist:  Jessica Bush, MD  Electrophysiologist:  None   Chief Complaint:  Follow-up hypertension and edema  History of Present Illness:    Jessica Kaufman is a 85 y.o. female with history of paroxysmal atrial fibrillation, aortic valve disease, PVCs, hypertension, hyperlipidemia, and rheumatoid arthritis.  I met her in December, at which time she was feeling well other than intermittent leg edema.  Home blood pressures were labile, with systolic readings from 983 to 180 mmHg.  We agreed to add HCTZ 25 mg daily and use furosemide 20 mg daily only as needed for significant leg swelling.  Blood pressure was mildly elevated at follow-up check in January.  Modest improvement in home readings were also noted.  No further medication changes were recommended.  Today, Jessica Kaufman reports that her overall blood pressure trend has improved, though she has continued to have some labile  readings.  Over the last 2 to 3 days, she has had some low blood pressures with associated dizziness (nadir 65/46).  These low blood pressures typically happen in the morning after she takes losartan HCTZ.  She therefore has moved her HCTZ timing to the late morning.  The only time that she is routinely taking hydralazine is at bedtime, as her blood pressure is normal during the day.  She otherwise has felt well, with minimal edema.  She denies chest pain, palpitations, and shortness of breath.  The patient does not have symptoms concerning for COVID-19 infection (fever, chills, cough, or new shortness of breath).    Past Medical History:  Diagnosis Date  . Aortic valve disease   . Asthma   . Cancer ([place])    basal cell   . Gout   . Hyperlipidemia   . Hypertension   . Hypertensive heart disease without heart failure   . Hypothyroidism   . PAF (paroxysmal atrial fibrillation) ([place])   . PVC (premature ventricular contraction)   . Rheumatoid arthritis ([place])   . Ventricular premature depolarization    Past Surgical History:  Procedure Laterality Date  . BLADDER SURGERY  10/19/2010  . BONY PELVIS SURGERY  1990  . BREAST BIOPSY Left 1986  . CARDIAC SURGERY  2009   PVC  . CATARACT EXTRACTION Left 11/15/00  . CATARACT EXTRACTION Right   . ELBOW SURGERY  06/1990  . HIP SURGERY Left 05/28/09  . HIP SURGERY Right 06/30/03  . HYSTEROSCOPY  06/17/1986   vaginal     Current Meds  Medication Sig  . acetaminophen (TYLENOL ARTHRITIS  PAIN) 650 MG CR tablet Take 650 mg by mouth every 8 (eight) hours as needed for pain.  Marland Kitchen allopurinol (ZYLOPRIM) 300 MG tablet Take 1 tablet by mouth every other day.   Marland Kitchen apixaban (ELIQUIS) 5 MG TABS tablet Take 5 mg by mouth 2 (two) times daily.   Marland Kitchen b complex vitamins tablet Take 1 tablet by mouth daily.  . Bilberry, Vaccinium myrtillus, 1000 MG CAPS Take 2 capsules by mouth daily.  Marland Kitchen Bioflavonoid Products (BIOFLEX) TABS Take 2 tablets by mouth daily.  . Calcium  Carb-Cholecalciferol (OYSTER SHELL CALCIUM/VITAMIN D PO)   . Cholecalciferol (VITAMIN D3) 1000 UNITS CAPS Take 1 capsule by mouth daily.  . Coenzyme Q10 (COQ10) 100 MG CAPS Take 1 capsule by mouth daily.  Marland Kitchen CRANBERRY PO Take 1 tablet by mouth daily.  Jessica Kaufman Calcium (STOOL SOFTENER PO) Take by mouth as needed.  Marland Kitchen estrogens, conjugated, (PREMARIN) 0.625 MG tablet Place 0.625 mg vaginally 2 (two) times a week. Take daily for 21 days then do not take for 7 days.  . fexofenadine (ALLEGRA) 180 MG tablet Take 180 mg by mouth daily.  . folic acid (FOLVITE) 1 MG tablet Take by mouth daily.   Marland Kitchen gabapentin (NEURONTIN) 300 MG capsule Take 300 mg by mouth 2 (two) times daily.   . Ginger, Zingiber officinalis, (GINGER ROOT PO) Take by mouth daily.  . hydrALAZINE (APRESOLINE) 50 MG tablet Take 1 tablet (50 mg total) by mouth 3 (three) times daily. (Patient taking differently: Take 50 mg by mouth at bedtime. )  . hydrochlorothiazide (HYDRODIURIL) 25 MG tablet Take 1 tablet (25 mg total) by mouth daily.  . lansoprazole (PREVACID) 15 MG capsule Take 15 mg by mouth daily as needed.  Marland Kitchen levothyroxine (SYNTHROID, LEVOTHROID) 50 MCG tablet Take 1 tablet by mouth daily.  Marland Kitchen losartan (COZAAR) 100 MG tablet Take 100 mg by mouth daily.   . Magnesium Oxide (PHILLIPS PO) Take by mouth daily as needed.  . meclizine (ANTIVERT) 25 MG tablet Take 25 mg by mouth 3 (three) times daily as needed for dizziness.  . methotrexate (RHEUMATREX) 2.5 MG tablet Take by mouth. Takes 7 tablets weekly.  . metoprolol succinate (TOPROL-XL) 50 MG 24 hr tablet Take 50 mg by mouth daily.   . Multiple Vitamins-Minerals (CENTRUM SILVER PO) Take 1 tablet by mouth daily.  . Multiple Vitamins-Minerals (PRESERVISION AREDS PO) Take by mouth daily.  . Omega-3 Fatty Acids (FISH OIL) 1000 MG CAPS Take 2 capsules by mouth daily.  Marland Kitchen oxybutynin (DITROPAN-XL) 10 MG 24 hr tablet Take 10 mg by mouth daily.  . predniSONE (DELTASONE) 5 MG tablet Take 5 mg by  mouth as directed.   . psyllium (METAMUCIL) 58.6 % powder Take 1 packet by mouth daily.  . TURMERIC PO Take by mouth daily.     Allergies:   Metronidazole; Sulindac; and Naproxen   Social History   Tobacco Use  . Smoking status: Never Smoker  . Smokeless tobacco: Never Used  Substance Use Topics  . Alcohol use: No  . Drug use: No     Family Hx: The patient's family history includes Breast cancer in her daughter; Heart attack in her father and maternal grandmother; Heart failure in her sister; Melanoma in her mother and sister; Stroke in her mother; Uterine cancer in her sister.  ROS:   Ms. Varnum notes occasional cough when outside without a mask, which she attributes to seasonal allergies.  Otherwise, a 12-system review of systems was negative except as noted  in the HPI.   Prior CV studies:   The following studies were reviewed today:  Carotid Doppler (04/28/2017): Mild disease with 1-39% stenoses in both internal carotid arteries.  Labs/Other Tests and Data Reviewed:    EKG:  No ECG reviewed.  Recent Labs: 04/25/2018: ALT 27; BUN 22; Creatinine, Ser 0.63; Hemoglobin 13.2; Platelets 295; Potassium 3.6; Sodium 136   Recent Lipid Panel No results found for: CHOL, TRIG, HDL, CHOLHDL, LDLCALC, LDLDIRECT  Wt Readings from Last 3 Encounters:  04/27/18 200 lb (90.7 kg)  01/11/18 203 lb (92.1 kg)  12/11/17 201 lb 12 oz (91.5 kg)     Objective:    Vital Signs:  BP 113/63 (BP Location: Left Arm, Patient Position: Sitting, Cuff Size: Normal)   Pulse 66   Ht '5\' 6"'$  (1.676 m)   Wt 200 lb (90.7 kg)   BMI 32.28 kg/m    VITAL SIGNS:  reviewed  ASSESSMENT & PLAN:    Labile hypertension: Blood pressure continues to fluctuate significantly, although most recent reading today is excellent at 113/63.  Ms. Koppel notes some lightheadedness with the low pressures but otherwise has been asymptomatic.  Edema seems to be under good control at this time.  It does not appear that Ms.  Melena has ever undergone a work-up for secondary hypertension.  Once COVID-19 restrictions have eased, I think it would be appropriate to obtain serum aldosterone and plasma renin activity levels, as well as 24-hour urine catecholamines/metanephrines, and renal artery Doppler.  In the meantime, I have suggested that Ms. Zigmund Daniel try taking her losartan in divided doses (50 mg twice daily rather than 100 mg every morning).  She should continue the remainder of her medications.  Paroxysmal atrial fibrillation: No symptoms to suggest recurrent atrial fibrillation.  Ms. Lafoy is tolerating metoprolol and apixaban well.  Labs obtained 2 days ago show normal renal function, electrolytes, and hemoglobin.  We will plan to continue indefinite anticoagulation.  COVID-19 Education: The signs and symptoms of COVID-19 were discussed with the patient and how to seek care for testing (follow up with PCP or arrange E-visit).  The importance of social distancing was discussed today.  Time:   Today, I have spent 15 minutes with the patient with telehealth technology discussing the above problems.     Medication Adjustments/Labs and Tests Ordered: Current medicines are reviewed at length with the patient today.  Concerns regarding medicines are outlined above.   Tests Ordered: None.  Medication Changes: Change losartan 100 mg daily -> losartan 50 mg BID  Disposition:  Follow up in 1 month(s)  Signed, Jessica Bush, MD  04/27/2018 8:26 AM    [facility]

## 2018-04-27 NOTE — Patient Instructions (Signed)
Medication Instructions:  Your physician has recommended you make the following change in your medication:  1- CHANGE Losartan to 50 mg by mouth two times a day.  If you need a refill on your cardiac medications before your next appointment, please call your pharmacy.   Lab work: none If you have labs (blood work) drawn today and your tests are completely normal, you will receive your results only by: Marland Kitchen MyChart Message (if you have MyChart) OR . A paper copy in the mail If you have any lab test that is abnormal or we need to change your treatment, we will call you to review the results.  Testing/Procedures: none  Follow-Up: At [facility], you and your health needs are our priority.  As part of our continuing mission to provide you with exceptional heart care, we have created designated Provider Care Teams.  These Care Teams include your primary Cardiologist (physician) and Advanced Practice Providers (APPs -  Physician Assistants and Nurse Practitioners) who all work together to provide you with the care you need, when you need it. You will need a follow up appointment in 1 months Virtual Visit with Dr End. (scheduled)

## 2018-04-27 NOTE — Telephone Encounter (Signed)
Patient had evisit with Dr End this morning. Called and went over AVS. She verbalized understanding to change her losartan to 50 mg by mouth two times a day. She will plan to cut her 100 mg tablet in half. She did not want me to send in any refills at this time. We also scheduled her 1 month virtual f/u. Message sent to [facility] to print and mail AVS.

## 2018-05-05 DIAGNOSIS — G4733 Obstructive sleep apnea (adult) (pediatric): Secondary | ICD-10-CM | POA: Diagnosis not present

## 2018-05-21 ENCOUNTER — Other Ambulatory Visit: Payer: Self-pay

## 2018-05-21 ENCOUNTER — Encounter: Payer: Self-pay | Admitting: Internal Medicine

## 2018-05-21 ENCOUNTER — Telehealth (INDEPENDENT_AMBULATORY_CARE_PROVIDER_SITE_OTHER): Payer: PPO | Admitting: Internal Medicine

## 2018-05-21 VITALS — BP 127/78 | HR 62 | Ht 66.0 in | Wt 203.0 lb

## 2018-05-21 DIAGNOSIS — I480 Paroxysmal atrial fibrillation: Secondary | ICD-10-CM | POA: Diagnosis not present

## 2018-05-21 DIAGNOSIS — R0989 Other specified symptoms and signs involving the circulatory and respiratory systems: Secondary | ICD-10-CM | POA: Diagnosis not present

## 2018-05-21 NOTE — Patient Instructions (Signed)
Medication Instructions:  Your physician recommends that you continue on your current medications as directed. Please refer to the Current Medication list given to you today.  If you need a refill on your cardiac medications before your next appointment, please call your pharmacy.   Lab work: none If you have labs (blood work) drawn today and your tests are completely normal, you will receive your results only by: Marland Kitchen MyChart Message (if you have MyChart) OR . A paper copy in the mail If you have any lab test that is abnormal or we need to change your treatment, we will call you to review the results.  Testing/Procedures: none  Follow-Up: At [facility], you and your health needs are our priority.  As part of our continuing mission to provide you with exceptional heart care, we have created designated Provider Care Teams.  These Care Teams include your primary Cardiologist (physician) and Advanced Practice Providers (APPs -  Physician Assistants and Nurse Practitioners) who all work together to provide you with the care you need, when you need it. You will need a follow up appointment in 3 months.  Please call our office 2 months in advance to schedule this appointment.  You may see Nelva Bush, MD or one of the following Advanced Practice Providers on your designated Care Team:   Murray Hodgkins, NP Christell Faith, PA-C . Marrianne Mood, PA-C

## 2018-05-21 NOTE — Progress Notes (Signed)
Virtual Visit via Telephone Note   This visit type was conducted due to national recommendations for restrictions regarding the COVID-19 Pandemic (e.g. social distancing) in an effort to limit this patient's exposure and mitigate transmission in our community.  Due to her co-morbid illnesses, this patient is at least at moderate risk for complications without adequate follow up.  This format is felt to be most appropriate for this patient at this time.  The patient did not have access to video technology/had technical difficulties with video requiring transitioning to audio format only (telephone).  All issues noted in this document were discussed and addressed.  No physical exam could be performed with this format.  Please refer to the patient's chart for her  consent to telehealth for [facility].   Date:  05/21/2018   ID:  Jessica Kaufman, DOB 1925-07-08, MRN [id number]  Patient Location: Home Provider Location: Home  PCP:  Isaias Sakai, DO  Cardiologist:  Nelva Bush, MD  Electrophysiologist:  None   Evaluation Performed:  Follow-Up Visit  Chief Complaint:  Follow-up high blood pressure  History of Present Illness:    Jessica Kaufman is a 85 y.o. female with history of paroxysmal atrial fibrillation, aortic valve disease, PVCs, hypertension, hyperlipidemia, and rheumatoid arthritis.  We last spoke about a month ago regarding Ms. Mangano labile blood pressure.  She had experienced 2 to 3 days of quite low pressures, with a nadir of 65/46.  This seem to be worst when she was taking HCTZ.  I recommended that Ms. Zigmund Daniel divide her losartan dose from 100 mg daily to 50 mg twice daily.  I also suggested obtaining serum aldosterone and plasma renin activity as well as 24-hour urine catecholamines/metanephrines when COVID-19 precautions have been eased.  Today, Ms. Hewins reports that her blood pressure has been more consistent since dividing losartan.  She  currently takes HCTZ when she first gets up in the morning, followed by losartan ~2-3 hours later.  She then takes her second dose of losartan at dinner time.  She continues to take metoprolol at bedtime.  Her blood pressure continues to fluctuate, though she has not had any further hypotensive episodes (lowest SBP has been 101).  Ms. Eskridge reports occasional lightheadedness that has been present for years and is stable.  She denies chest pain, shortness of breath, palpitations, and orthopnea.  Mild dependent leg edema remains much better than when we first met in December.  Ms. Kronick wishes she had more energy but has also been more sedentary during the COVID-19 pandemic.  The patient does not have symptoms concerning for COVID-19 infection (fever, chills, cough, or new shortness of breath).    Past Medical History:  Diagnosis Date  . Aortic valve disease   . Asthma   . Cancer ([place])    basal cell   . Gout   . Hyperlipidemia   . Hypertension   . Hypertensive heart disease without heart failure   . Hypothyroidism   . PAF (paroxysmal atrial fibrillation) ([place])   . PVC (premature ventricular contraction)   . Rheumatoid arthritis ([place])   . Ventricular premature depolarization    Past Surgical History:  Procedure Laterality Date  . BLADDER SURGERY  10/19/2010  . BONY PELVIS SURGERY  1990  . BREAST BIOPSY Left 1986  . CARDIAC SURGERY  2009   PVC  . CATARACT EXTRACTION Left 11/15/00  . CATARACT EXTRACTION Right   . ELBOW SURGERY  06/1990  . HIP SURGERY Left 05/28/09  .  HIP SURGERY Right 06/30/03  . HYSTEROSCOPY  06/17/1986   vaginal     Current Meds  Medication Sig  . acetaminophen (TYLENOL ARTHRITIS PAIN) 650 MG CR tablet Take 650 mg by mouth every 8 (eight) hours as needed for pain.  Marland Kitchen allopurinol (ZYLOPRIM) 300 MG tablet Take 1 tablet by mouth every other day.   Marland Kitchen apixaban (ELIQUIS) 5 MG TABS tablet Take 5 mg by mouth 2 (two) times daily.   Marland Kitchen b complex vitamins tablet Take 1  tablet by mouth daily.  . Bilberry, Vaccinium myrtillus, 1000 MG CAPS Take 2 capsules by mouth daily.  Marland Kitchen Bioflavonoid Products (BIOFLEX) TABS Take 2 tablets by mouth daily.  . Calcium Carb-Cholecalciferol (OYSTER SHELL CALCIUM/VITAMIN D PO)   . Cholecalciferol (VITAMIN D3) 1000 UNITS CAPS Take 1 capsule by mouth daily.  . Coenzyme Q10 (COQ10) 100 MG CAPS Take 1 capsule by mouth daily.  Marland Kitchen CRANBERRY PO Take 1 tablet by mouth daily.  Mariane Baumgarten Calcium (STOOL SOFTENER PO) Take by mouth as needed.  Marland Kitchen estrogens, conjugated, (PREMARIN) 0.625 MG tablet Place 0.625 mg vaginally 2 (two) times a week. Take daily for 21 days then do not take for 7 days.  . fexofenadine (ALLEGRA) 180 MG tablet Take 180 mg by mouth daily.  . folic acid (FOLVITE) 1 MG tablet Take by mouth daily.   Marland Kitchen gabapentin (NEURONTIN) 300 MG capsule Take 300 mg by mouth 2 (two) times daily.   . Ginger, Zingiber officinalis, (GINGER ROOT PO) Take by mouth daily.  . hydrALAZINE (APRESOLINE) 50 MG tablet Take 1 tablet (50 mg total) by mouth 3 (three) times daily. (Patient taking differently: Take 50 mg by mouth as needed. )  . hydrochlorothiazide (HYDRODIURIL) 25 MG tablet Take 1 tablet (25 mg total) by mouth daily.  . lansoprazole (PREVACID) 15 MG capsule Take 15 mg by mouth daily as needed.  Marland Kitchen levothyroxine (SYNTHROID, LEVOTHROID) 50 MCG tablet Take 1 tablet by mouth daily.  Marland Kitchen losartan (COZAAR) 50 MG tablet Take 1 tablet (50 mg total) by mouth 2 (two) times daily.  . Magnesium Oxide (PHILLIPS PO) Take by mouth daily as needed.  . methotrexate (RHEUMATREX) 2.5 MG tablet Take by mouth. Takes 7 tablets weekly.  . metoprolol succinate (TOPROL-XL) 50 MG 24 hr tablet Take 50 mg by mouth daily.   . Multiple Vitamins-Minerals (CENTRUM SILVER PO) Take 1 tablet by mouth daily.  . Multiple Vitamins-Minerals (PRESERVISION AREDS PO) Take by mouth daily.  . Omega-3 Fatty Acids (FISH OIL) 1000 MG CAPS Take 2 capsules by mouth daily.  Marland Kitchen oxybutynin  (DITROPAN-XL) 10 MG 24 hr tablet Take 10 mg by mouth daily.  . predniSONE (DELTASONE) 5 MG tablet Take 5 mg by mouth as directed.   . psyllium (METAMUCIL) 58.6 % powder Take 1 packet by mouth daily.  . TURMERIC PO Take by mouth daily.     Allergies:   Metronidazole; Sulindac; and Naproxen   Social History   Tobacco Use  . Smoking status: Never Smoker  . Smokeless tobacco: Never Used  Substance Use Topics  . Alcohol use: No  . Drug use: No     Family Hx: The patient's family history includes Breast cancer in her daughter; Heart attack in her father and maternal grandmother; Heart failure in her sister; Melanoma in her mother and sister; Stroke in her mother; Uterine cancer in her sister.  ROS:   Please see the history of present illness.   All other systems reviewed and are negative.  Prior CV studies:   The following studies were reviewed today:  Carotid Doppler (04/28/2017): Mild disease with 1-39% stenoses in both internal carotid arteries.  Labs/Other Tests and Data Reviewed:    EKG:  No ECG reviewed.  Recent Labs: 04/25/2018: ALT 27; BUN 22; Creatinine, Ser 0.63; Hemoglobin 13.2; Platelets 295; Potassium 3.6; Sodium 136   Recent Lipid Panel No results found for: CHOL, TRIG, HDL, CHOLHDL, LDLCALC, LDLDIRECT  Wt Readings from Last 3 Encounters:  05/21/18 203 lb (92.1 kg)  04/27/18 200 lb (90.7 kg)  01/11/18 203 lb (92.1 kg)     Objective:    Vital Signs:  BP 127/78 (BP Location: Left Arm, Patient Position: Sitting, Cuff Size: Normal)   Pulse 62   Ht '5\' 6"'$  (1.676 m)   Wt 203 lb (92.1 kg)   BMI 32.77 kg/m    VITAL SIGNS:  reviewed  ASSESSMENT & PLAN:    Labile hypertension: BP more consistent with less spikes and dips since losartan was divided (50 mg BID rather than 100 mg daily).  We will continue current regimen of HCTZ, losartan, and metoprolol.  We have agreed to defer secondary hypertension workup to our follow-up visit.  Paroxysmal atrial  fibrillation: No palpitations noted.  We will plan to continue current doses of metoprolol and apixaban.  COVID-19 Education: The signs and symptoms of COVID-19 were discussed with the patient and how to seek care for testing (follow up with PCP or arrange E-visit).  The importance of social distancing was discussed today.  Time:   Today, I have spent 10 minutes with the patient with telehealth technology discussing the above problems.     Medication Adjustments/Labs and Tests Ordered: Current medicines are reviewed at length with the patient today.  Concerns regarding medicines are outlined above.   Tests Ordered: None.  Medication Changes: None.  Disposition:  Follow up in 3 month(s)  Signed, Nelva Bush, MD  05/21/2018 3:33 PM    [facility]

## 2018-05-25 ENCOUNTER — Other Ambulatory Visit: Payer: Self-pay | Admitting: *Deleted

## 2018-05-25 MED ORDER — LOSARTAN POTASSIUM 50 MG PO TABS: 50.0000 mg | ORAL_TABLET | Freq: Two times a day (BID) | ORAL | 2 refills | Status: DC

## 2018-05-29 ENCOUNTER — Telehealth: Payer: Self-pay | Admitting: Internal Medicine

## 2018-05-29 NOTE — Telephone Encounter (Signed)
Pt c/o medication issue:  1. Name of Medication: losartan   2. How are you currently taking this medication (dosage and times per day)?  discrepancy   3. Are you having a reaction (difficulty breathing--STAT)? No   4. What is your medication issue? rx change per pharmacy please call to confirm change and patient was made aware

## 2018-05-29 NOTE — Telephone Encounter (Signed)
No answer with [facility]. Left detailed message on a secure VM that losartan is supposed to be 50 mg by mouth two times a day.

## 2018-05-30 NOTE — Telephone Encounter (Signed)
No answer with [facility]. Left detailed message on a secure VM that losartan is supposed to be 50 mg by mouth two times a day and to call back if any further questions.

## 2018-06-05 DIAGNOSIS — G4733 Obstructive sleep apnea (adult) (pediatric): Secondary | ICD-10-CM | POA: Diagnosis not present

## 2018-06-06 ENCOUNTER — Telehealth: Payer: PPO | Admitting: Internal Medicine

## 2018-06-25 DIAGNOSIS — M0589 Other rheumatoid arthritis with rheumatoid factor of multiple sites: Secondary | ICD-10-CM | POA: Diagnosis not present

## 2018-06-25 DIAGNOSIS — M1990 Unspecified osteoarthritis, unspecified site: Secondary | ICD-10-CM | POA: Diagnosis not present

## 2018-06-25 DIAGNOSIS — M25569 Pain in unspecified knee: Secondary | ICD-10-CM | POA: Diagnosis not present

## 2018-06-25 DIAGNOSIS — Z79899 Other long term (current) drug therapy: Secondary | ICD-10-CM | POA: Diagnosis not present

## 2018-06-25 DIAGNOSIS — R42 Dizziness and giddiness: Secondary | ICD-10-CM | POA: Diagnosis not present

## 2018-06-25 DIAGNOSIS — M25532 Pain in left wrist: Secondary | ICD-10-CM | POA: Diagnosis not present

## 2018-07-05 DIAGNOSIS — G4733 Obstructive sleep apnea (adult) (pediatric): Secondary | ICD-10-CM | POA: Diagnosis not present

## 2018-08-05 DIAGNOSIS — G4733 Obstructive sleep apnea (adult) (pediatric): Secondary | ICD-10-CM | POA: Diagnosis not present

## 2018-08-09 DIAGNOSIS — Z1331 Encounter for screening for depression: Secondary | ICD-10-CM | POA: Diagnosis not present

## 2018-08-09 DIAGNOSIS — Z6832 Body mass index (BMI) 32.0-32.9, adult: Secondary | ICD-10-CM | POA: Diagnosis not present

## 2018-08-09 DIAGNOSIS — E785 Hyperlipidemia, unspecified: Secondary | ICD-10-CM | POA: Diagnosis not present

## 2018-08-09 DIAGNOSIS — Z9181 History of falling: Secondary | ICD-10-CM | POA: Diagnosis not present

## 2018-08-09 DIAGNOSIS — Z0000 Encounter for general adult medical examination without abnormal findings: Secondary | ICD-10-CM | POA: Diagnosis not present

## 2018-08-23 ENCOUNTER — Other Ambulatory Visit: Payer: Self-pay

## 2018-08-23 ENCOUNTER — Telehealth (INDEPENDENT_AMBULATORY_CARE_PROVIDER_SITE_OTHER): Payer: PPO | Admitting: Internal Medicine

## 2018-08-23 ENCOUNTER — Encounter: Payer: Self-pay | Admitting: Internal Medicine

## 2018-08-23 VITALS — BP 123/75 | HR 56 | Ht 66.0 in | Wt 198.0 lb

## 2018-08-23 DIAGNOSIS — I10 Essential (primary) hypertension: Secondary | ICD-10-CM | POA: Diagnosis not present

## 2018-08-23 DIAGNOSIS — R0989 Other specified symptoms and signs involving the circulatory and respiratory systems: Secondary | ICD-10-CM

## 2018-08-23 DIAGNOSIS — I480 Paroxysmal atrial fibrillation: Secondary | ICD-10-CM

## 2018-08-23 NOTE — Progress Notes (Signed)
Virtual Visit via Telephone Note   This visit type was conducted due to national recommendations for restrictions regarding the COVID-19 Pandemic (e.g. social distancing) in an effort to limit this patient's exposure and mitigate transmission in our community.  Due to her co-morbid illnesses, this patient is at least at moderate risk for complications without adequate follow up.  This format is felt to be most appropriate for this patient at this time.  The patient did not have access to video technology/had technical difficulties with video requiring transitioning to audio format only (telephone).  All issues noted in this document were discussed and addressed.  No physical exam could be performed with this format.  Please refer to the patient's chart for her  consent to telehealth for [facility].   Date:  08/23/2018   ID:  Jessica Kaufman, DOB 28-Feb-1925, MRN [id number]  Patient Location: Home Provider Location: Office  PCP:  Isaias Sakai, DO  Cardiologist:  Nelva Bush, MD  Electrophysiologist:  None   Evaluation Performed:  Follow-Up Visit  Chief Complaint:  Follow-up hypertension  History of Present Illness:    Jessica Kaufman is a 85 y.o. female with history of PAF, aortic valve disease, PVCs, HTN, HLD, and rheumatoid arthritis.  We last spoke in mid May, at which time Jessica. Zigmund Daniel reported feeling well with more consistent blood pressure control.  She endorsed only occasional episodes of lightheadedness, which had been present for years.  Mild lower extremity edema was improved compared to our first visit in 12/2017.  We did not make any medication changes at that time.  Today, Jessica Kaufman reports feeling well.  She experienced a rise in her BP a couple of weeks ago in the setting of increased salt use.  Since stopping this, her BP has been back at baseline.  She continues to skip occasional doses of losartan if her late morning BP is normal/low.  She notes  a single episode of transient lightheadedness this morning but notes that her BP was 123/75 at the time.  She denies chest pain, shortness of breath, and palpitations.  She had edema in her legs earlier this week after standing for an extended period, though this has improved with elevation of her legs.  She notes a "popped" blood vessel with bruising in her calf that accompanied the aforementioned edema.  She otherwise has not had any significant bleeding, remaining on apixaban.  The patient does not have symptoms concerning for COVID-19 infection (fever, chills, cough, or new shortness of breath).   Past Medical History:  Diagnosis Date  . Aortic valve disease   . Asthma   . Cancer ([place])    basal cell   . Gout   . Hyperlipidemia   . Hypertension   . Hypertensive heart disease without heart failure   . Hypothyroidism   . PAF (paroxysmal atrial fibrillation) ([place])   . PVC (premature ventricular contraction)   . Rheumatoid arthritis ([place])   . Ventricular premature depolarization    Past Surgical History:  Procedure Laterality Date  . BLADDER SURGERY  10/19/2010  . BONY PELVIS SURGERY  1990  . BREAST BIOPSY Left 1986  . CARDIAC SURGERY  2009   PVC  . CATARACT EXTRACTION Left 11/15/00  . CATARACT EXTRACTION Right   . ELBOW SURGERY  06/1990  . HIP SURGERY Left 05/28/09  . HIP SURGERY Right 06/30/03  . HYSTEROSCOPY  06/17/1986   vaginal     Current Meds  Medication Sig  .  acetaminophen (TYLENOL ARTHRITIS PAIN) 650 MG CR tablet Take 650 mg by mouth every 8 (eight) hours as needed for pain.  Marland Kitchen allopurinol (ZYLOPRIM) 300 MG tablet Take 1 tablet by mouth every other day.   Marland Kitchen apixaban (ELIQUIS) 5 MG TABS tablet Take 5 mg by mouth 2 (two) times daily.   Marland Kitchen b complex vitamins tablet Take 1 tablet by mouth daily.  . Bilberry, Vaccinium myrtillus, 1000 MG CAPS Take 2 capsules by mouth daily.  Marland Kitchen Bioflavonoid Products (BIOFLEX) TABS Take 2 tablets by mouth daily.  . Calcium  Carb-Cholecalciferol (OYSTER SHELL CALCIUM/VITAMIN D PO)   . Cholecalciferol (VITAMIN D3) 1000 UNITS CAPS Take 1 capsule by mouth daily.  . Coenzyme Q10 (COQ10) 100 MG CAPS Take 1 capsule by mouth daily.  Marland Kitchen CRANBERRY PO Take 1 tablet by mouth daily.  Mariane Baumgarten Calcium (STOOL SOFTENER PO) Take by mouth as needed.  Marland Kitchen estrogens, conjugated, (PREMARIN) 0.625 MG tablet Place 0.625 mg vaginally 2 (two) times a week. Take daily for 21 days then do not take for 7 days.  . fexofenadine (ALLEGRA) 180 MG tablet Take 180 mg by mouth daily.  . folic acid (FOLVITE) 1 MG tablet Take by mouth daily.   Marland Kitchen gabapentin (NEURONTIN) 300 MG capsule Take 300 mg by mouth 2 (two) times daily.   . Ginger, Zingiber officinalis, (GINGER ROOT PO) Take by mouth daily.  . hydrochlorothiazide (HYDRODIURIL) 25 MG tablet Take 1 tablet (25 mg total) by mouth daily.  . lansoprazole (PREVACID) 15 MG capsule Take 15 mg by mouth daily as needed.  Marland Kitchen levothyroxine (SYNTHROID, LEVOTHROID) 50 MCG tablet Take 1 tablet by mouth daily.  Marland Kitchen losartan (COZAAR) 50 MG tablet Take 1 tablet (50 mg total) by mouth 2 (two) times daily.  . Magnesium Oxide (PHILLIPS PO) Take by mouth daily as needed.  . methotrexate (RHEUMATREX) 2.5 MG tablet Take by mouth. Takes 7 tablets weekly.  . metoprolol succinate (TOPROL-XL) 50 MG 24 hr tablet Take 50 mg by mouth daily.   . Multiple Vitamins-Minerals (CENTRUM SILVER PO) Take 1 tablet by mouth daily.  . Multiple Vitamins-Minerals (PRESERVISION AREDS PO) Take by mouth daily.  . Omega-3 Fatty Acids (FISH OIL) 1000 MG CAPS Take 2 capsules by mouth daily.  Marland Kitchen oxybutynin (DITROPAN-XL) 10 MG 24 hr tablet Take 10 mg by mouth daily.  . predniSONE (DELTASONE) 5 MG tablet Take 5 mg by mouth as directed.   . psyllium (METAMUCIL) 58.6 % powder Take 1 packet by mouth daily.  . TURMERIC PO Take by mouth daily.     Allergies:   Metronidazole, Sulindac, and Naproxen   Social History   Tobacco Use  . Smoking status:  Never Smoker  . Smokeless tobacco: Never Used  Substance Use Topics  . Alcohol use: No  . Drug use: No     Family Hx: The patient's family history includes Breast cancer in her daughter; Heart attack in her father and maternal grandmother; Heart failure in her sister; Melanoma in her mother and sister; Stroke in her mother; Uterine cancer in her sister.  ROS:   Please see the history of present illness.   All other systems reviewed and are negative.   Prior CV studies:   The following studies were reviewed today:  Carotid Doppler (04/28/2017): Mild disease with 1-39% stenoses in both internal carotid arteries.  Labs/Other Tests and Data Reviewed:    EKG:  No ECG reviewed.  Recent Labs: 04/25/2018: ALT 27; BUN 22; Creatinine, Ser 0.63; Hemoglobin 13.2;  Platelets 295; Potassium 3.6; Sodium 136   Recent Lipid Panel No results found for: CHOL, TRIG, HDL, CHOLHDL, LDLCALC, LDLDIRECT  Wt Readings from Last 3 Encounters:  08/23/18 198 lb (89.8 kg)  05/21/18 203 lb (92.1 kg)  04/27/18 200 lb (90.7 kg)     Objective:    Vital Signs:  BP 123/75   Pulse (!) 56   Ht 5\' 6"  (1.676 m)   Wt 198 lb (89.8 kg)   BMI 31.96 kg/m    VITAL SIGNS:  reviewed  ASSESSMENT & PLAN:    Labile hypertension: Other than a week or two of elevated readings in the setting of dietary indiscretion, BP has been stable.  We will continue current medications.  I reinforced the importance of sodium restriction.  Paroxysmal atrial fibrillation: No symptoms to suggest recurrence.  We will continue current doses of metoprolol and apixaban.  COVID-19 Education: The signs and symptoms of COVID-19 were discussed with the patient and how to seek care for testing (follow up with PCP or arrange E-visit).  The importance of social distancing was discussed today.  Time:   Today, I have spent 10 minutes with the patient with telehealth technology discussing the above problems.     Medication Adjustments/Labs  and Tests Ordered: Current medicines are reviewed at length with the patient today.  Concerns regarding medicines are outlined above.   Tests Ordered: None.  Medication Changes: None.  Follow Up:  Virtual Visit in 3 month(s)  Signed, Nelva Bush, MD  08/23/2018 11:38 AM    [place]

## 2018-08-23 NOTE — Patient Instructions (Signed)
Medication Instructions:  Your physician recommends that you continue on your current medications as directed. Please refer to the Current Medication list given to you today.  If you need a refill on your cardiac medications before your next appointment, please call your pharmacy.   Lab work: - None ordered.  If you have labs (blood work) drawn today and your tests are completely normal, you will receive your results only by: Marland Kitchen MyChart Message (if you have MyChart) OR . A paper copy in the mail If you have any lab test that is abnormal or we need to change your treatment, we will call you to review the results.  Testing/Procedures: - None ordered.   Follow-Up: At [facility], you and your health needs are our priority.  As part of our continuing mission to provide you with exceptional heart care, we have created designated Provider Care Teams.  These Care Teams include your primary Cardiologist (physician) and Advanced Practice Providers (APPs -  Physician Assistants and Nurse Practitioners) who all work together to provide you with the care you need, when you need it. You will need a follow up appointment in 3 months Virtual visit is fine.   You may see Nelva Bush, MD or one of the following Advanced Practice Providers on your designated Care Team:   Murray Hodgkins, NP Christell Faith, PA-C . Marrianne Mood, PA-C

## 2018-08-24 DIAGNOSIS — Z79899 Other long term (current) drug therapy: Secondary | ICD-10-CM | POA: Diagnosis not present

## 2018-08-24 DIAGNOSIS — D6869 Other thrombophilia: Secondary | ICD-10-CM | POA: Diagnosis not present

## 2018-08-24 DIAGNOSIS — I480 Paroxysmal atrial fibrillation: Secondary | ICD-10-CM | POA: Diagnosis not present

## 2018-08-24 DIAGNOSIS — E039 Hypothyroidism, unspecified: Secondary | ICD-10-CM | POA: Diagnosis not present

## 2018-08-24 DIAGNOSIS — M109 Gout, unspecified: Secondary | ICD-10-CM | POA: Diagnosis not present

## 2018-08-24 DIAGNOSIS — I10 Essential (primary) hypertension: Secondary | ICD-10-CM | POA: Diagnosis not present

## 2018-08-24 DIAGNOSIS — R238 Other skin changes: Secondary | ICD-10-CM | POA: Diagnosis not present

## 2018-08-24 DIAGNOSIS — G5733 Lesion of lateral popliteal nerve, bilateral lower limbs: Secondary | ICD-10-CM | POA: Diagnosis not present

## 2018-08-24 DIAGNOSIS — G629 Polyneuropathy, unspecified: Secondary | ICD-10-CM | POA: Diagnosis not present

## 2018-08-24 DIAGNOSIS — R32 Unspecified urinary incontinence: Secondary | ICD-10-CM | POA: Diagnosis not present

## 2018-09-15 DIAGNOSIS — H353230 Exudative age-related macular degeneration, bilateral, stage unspecified: Secondary | ICD-10-CM | POA: Diagnosis not present

## 2018-09-24 ENCOUNTER — Telehealth (INDEPENDENT_AMBULATORY_CARE_PROVIDER_SITE_OTHER): Payer: PPO | Admitting: Internal Medicine

## 2018-09-24 ENCOUNTER — Other Ambulatory Visit: Payer: Self-pay

## 2018-09-24 ENCOUNTER — Encounter: Payer: Self-pay | Admitting: Internal Medicine

## 2018-09-24 DIAGNOSIS — I480 Paroxysmal atrial fibrillation: Secondary | ICD-10-CM

## 2018-09-24 DIAGNOSIS — R0989 Other specified symptoms and signs involving the circulatory and respiratory systems: Secondary | ICD-10-CM

## 2018-09-24 NOTE — Progress Notes (Signed)
Virtual Visit via Telephone Note   This visit type was conducted due to national recommendations for restrictions regarding the COVID-19 Pandemic (e.g. social distancing) in an effort to limit this patient's exposure and mitigate transmission in our community.  Due to her co-morbid illnesses, this patient is at least at moderate risk for complications without adequate follow up.  This format is felt to be most appropriate for this patient at this time.  The patient did not have access to video technology/had technical difficulties with video requiring transitioning to audio format only (telephone).  All issues noted in this document were discussed and addressed.  No physical exam could be performed with this format.  Please refer to the patient's chart for her  consent to telehealth for [facility].   Date:  09/24/2018   ID:  Jessica Kaufman, DOB Dec 11, 1925, MRN [id number]  Patient Location: Home Provider Location: Office  PCP:  Isaias Sakai, DO  Cardiologist:  Nelva Bush, MD  Electrophysiologist:  None   Evaluation Performed:  Follow-Up Visit  Chief Complaint: Elevated blood pressure  History of Present Illness:    Jessica Kaufman is a 85 y.o. female with history of labile hypertension, paroxysmal atrial fibrillation, aortic valve disease, PVCs, hyperlipidemia, and rheumatoid arthritis.  We last spoke a month ago, at which time Jessica Kaufman was feeling well.  She reported transient rise in her blood pressure in the setting of increased sodium intake, though her blood pressure had returned back to baseline with salt avoidance.  We did not make any medication changes at that time.  Today, Jessica Kaufman reports that her blood pressure has been running high.  This morning it was 212/111 and only transiently improved after her morning dose of HCTZ.  When her blood pressure is this high, she feels somewhat dizzy.  Her legs have also felt heavy.  She has experienced this  in the past when her blood pressure is poorly controlled.  She denies focal weakness as well as chest pain, shortness of breath, and palpitations.  For the last few days, she had experienced leg swelling, though this was no longer present this morning.  Jessica Kaufman reports being compliant with her medications as well as a low-sodium diet.  However, she subsequently reports that she ate a ham biscuit 3 days ago.  Shortly thereafter, she began to notice elevated blood pressures with the aforementioned symptoms.  The patient does not have symptoms concerning for COVID-19 infection (fever, chills, cough, or new shortness of breath).    Past Medical History:  Diagnosis Date  . Aortic valve disease   . Asthma   . Cancer ([place])    basal cell   . Gout   . Hyperlipidemia   . Hypertension   . Hypertensive heart disease without heart failure   . Hypothyroidism   . PAF (paroxysmal atrial fibrillation) ([place])   . PVC (premature ventricular contraction)   . Rheumatoid arthritis ([employer])   . Ventricular premature depolarization    Past Surgical History:  Procedure Laterality Date  . BLADDER SURGERY  10/19/2010  . BONY PELVIS SURGERY  1990  . BREAST BIOPSY Left 1986  . CARDIAC SURGERY  2009   PVC  . CATARACT EXTRACTION Left 11/15/00  . CATARACT EXTRACTION Right   . ELBOW SURGERY  06/1990  . HIP SURGERY Left 05/28/09  . HIP SURGERY Right 06/30/03  . HYSTEROSCOPY  06/17/1986   vaginal     Current Meds  Medication Sig  . acetaminophen (  TYLENOL ARTHRITIS PAIN) 650 MG CR tablet Take 650 mg by mouth every 8 (eight) hours as needed for pain.  Marland Kitchen allopurinol (ZYLOPRIM) 300 MG tablet Take 1 tablet by mouth every other day.   Marland Kitchen apixaban (ELIQUIS) 5 MG TABS tablet Take 5 mg by mouth 2 (two) times daily.   Marland Kitchen b complex vitamins tablet Take 1 tablet by mouth daily.  . Bilberry, Vaccinium myrtillus, 1000 MG CAPS Take 2 capsules by mouth daily.  Marland Kitchen Bioflavonoid Products (BIOFLEX) TABS Take 2 tablets by mouth  daily.  . Calcium Carb-Cholecalciferol (OYSTER SHELL CALCIUM/VITAMIN D PO) daily.   . Cholecalciferol (VITAMIN D3) 1000 UNITS CAPS Take 1 capsule by mouth daily.  . Coenzyme Q10 (COQ10) 100 MG CAPS Take 1 capsule by mouth daily.  Marland Kitchen CRANBERRY PO Take 1 tablet by mouth daily.  Mariane Baumgarten Calcium (STOOL SOFTENER PO) Take by mouth as needed.  Marland Kitchen estrogens, conjugated, (PREMARIN) 0.625 MG tablet Place 0.625 mg vaginally 2 (two) times a week. Take daily for 21 days then do not take for 7 days.  . fexofenadine (ALLEGRA) 180 MG tablet Take 180 mg by mouth daily.  . folic acid (FOLVITE) 1 MG tablet Take by mouth daily.   Marland Kitchen gabapentin (NEURONTIN) 300 MG capsule Take 300 mg by mouth 2 (two) times daily.   . Ginger, Zingiber officinalis, (GINGER ROOT PO) Take by mouth daily.  . hydrochlorothiazide (HYDRODIURIL) 25 MG tablet Take 1 tablet (25 mg total) by mouth daily.  . lansoprazole (PREVACID) 15 MG capsule Take 15 mg by mouth daily as needed.  Marland Kitchen levothyroxine (SYNTHROID, LEVOTHROID) 50 MCG tablet Take 1 tablet by mouth daily.  Marland Kitchen losartan (COZAAR) 50 MG tablet Take 1 tablet (50 mg total) by mouth 2 (two) times daily.  . Magnesium Oxide (PHILLIPS PO) Take by mouth daily as needed.  . methotrexate (RHEUMATREX) 2.5 MG tablet Take by mouth. Takes 7 tablets weekly.  . metoprolol succinate (TOPROL-XL) 50 MG 24 hr tablet Take 50 mg by mouth daily.   . Multiple Vitamins-Minerals (CENTRUM SILVER PO) Take 1 tablet by mouth daily.  . Multiple Vitamins-Minerals (PRESERVISION AREDS PO) Take by mouth daily.  . Omega-3 Fatty Acids (FISH OIL) 1000 MG CAPS Take 2 capsules by mouth daily.  Marland Kitchen oxybutynin (DITROPAN-XL) 10 MG 24 hr tablet Take 10 mg by mouth daily.  . predniSONE (DELTASONE) 5 MG tablet Take 5 mg by mouth as directed.   . psyllium (METAMUCIL) 58.6 % powder Take 1 packet by mouth daily.  . TURMERIC PO Take by mouth daily.     Allergies:   Metronidazole, Sulindac, and Naproxen   Social History   Tobacco  Use  . Smoking status: Never Smoker  . Smokeless tobacco: Never Used  Substance Use Topics  . Alcohol use: No  . Drug use: No     Family Hx: The patient's family history includes Breast cancer in her daughter; Heart attack in her father and maternal grandmother; Heart failure in her sister; Melanoma in her mother and sister; Stroke in her mother; Uterine cancer in her sister.  ROS:   Please see the history of present illness.   All other systems reviewed and are negative.   Prior CV studies:   The following studies were reviewed today:  Carotid Doppler (04/28/2017): Mild disease with 1-39% stenoses in both internal carotid arteries.  Labs/Other Tests and Data Reviewed:    EKG:  No ECG reviewed.  Recent Labs: 04/25/2018: ALT 27; BUN 22; Creatinine, Ser 0.63; Hemoglobin 13.2;  Platelets 295; Potassium 3.6; Sodium 136   Recent Lipid Panel No results found for: CHOL, TRIG, HDL, CHOLHDL, LDLCALC, LDLDIRECT  Wt Readings from Last 3 Encounters:  09/24/18 197 lb (89.4 kg)  08/23/18 198 lb (89.8 kg)  05/21/18 203 lb (92.1 kg)     Objective:    Vital Signs:  BP (!) 191/106 (BP Location: Left Arm, Patient Position: Sitting, Cuff Size: Normal)   Pulse 66   Ht 5\' 6"  (1.676 m)   Wt 197 lb (89.4 kg)   BMI 31.80 kg/m    VITAL SIGNS:  reviewed  ASSESSMENT & PLAN:    Labile hypertension: Blood pressure fairly elevated today but was previously under good control.  I suspect that Jessica Kaufman is very sensitive to sodium, with recent ham biscuit having precipitated elevated blood pressure readings over the last few days.  I have advised her that is okay to take her afternoon dose of losartan 50 mg early if her blood pressure remains above 160/90.  Hopefully, as she continues to have natriuresis, her blood pressure will normalize.  If she has not noticed any improvement in 2 days from now, she should contact us so we can discuss obtaining a basic metabolic panel and titrating her  medications.  Paroxysmal atrial fibrillation: No palpitations; I suspect vague dizziness and leg heaviness are not related to a-fib, though this cannot be excluded.  We will continue current doses of metoprolol and apixaban.  If aforementioned symptoms do not improve with better blood pressure control, will need to have her come in for an EKG to exclude atrial fibrillation.  COVID-19 Education: The signs and symptoms of COVID-19 were discussed with the patient and how to seek care for testing (follow up with PCP or arrange E-visit).  The importance of social distancing was discussed today.  Time:   Today, I have spent 10 minutes with the patient with telehealth technology discussing the above problems.     Medication Adjustments/Labs and Tests Ordered: Current medicines are reviewed at length with the patient today.  Concerns regarding medicines are outlined above.   Tests Ordered: None.  Medication Changes: None  Follow Up:  In Person on 11/22/2018 as previously scheduled.  Signed, Nelva Bush, MD  09/24/2018 1:24 PM    [facility]

## 2018-09-24 NOTE — Patient Instructions (Signed)
Medication Instructions:  Your physician recommends that you continue on your current medications as directed. Please refer to the Current Medication list given to you today.  You may go ahead and take evening dose of losartan earlier in the day if your blood pressure is great than 140/90.  If you need a refill on your cardiac medications before your next appointment, please call your pharmacy.   Lab work: NONE If you have labs (blood work) drawn today and your tests are completely normal, you will receive your results only by: Marland Kitchen MyChart Message (if you have MyChart) OR . A paper copy in the mail If you have any lab test that is abnormal or we need to change your treatment, we will call you to review the results.  Testing/Procedures: NONE  Follow-Up: At [facility], you and your health needs are our priority.  As part of our continuing mission to provide you with exceptional heart care, we have created designated Provider Care Teams.  These Care Teams include your primary Cardiologist (physician) and Advanced Practice Providers (APPs -  Physician Assistants and Nurse Practitioners) who all work together to provide you with the care you need, when you need it. You will need a follow up appointment in AS SCHEDULED. NEED TO SWITCH TO IN PERSON.  You may see Nelva Bush, MD or one of the following Advanced Practice Providers on your designated Care Team:   Murray Hodgkins, NP Christell Faith, PA-C . Marrianne Mood, PA-C

## 2018-10-08 DIAGNOSIS — E669 Obesity, unspecified: Secondary | ICD-10-CM | POA: Diagnosis not present

## 2018-10-08 DIAGNOSIS — R5383 Other fatigue: Secondary | ICD-10-CM | POA: Diagnosis not present

## 2018-10-08 DIAGNOSIS — Z79899 Other long term (current) drug therapy: Secondary | ICD-10-CM | POA: Diagnosis not present

## 2018-10-08 DIAGNOSIS — M0589 Other rheumatoid arthritis with rheumatoid factor of multiple sites: Secondary | ICD-10-CM | POA: Diagnosis not present

## 2018-10-08 DIAGNOSIS — M79643 Pain in unspecified hand: Secondary | ICD-10-CM | POA: Diagnosis not present

## 2018-10-08 DIAGNOSIS — M1990 Unspecified osteoarthritis, unspecified site: Secondary | ICD-10-CM | POA: Diagnosis not present

## 2018-11-07 DIAGNOSIS — I480 Paroxysmal atrial fibrillation: Secondary | ICD-10-CM | POA: Diagnosis not present

## 2018-11-07 DIAGNOSIS — M109 Gout, unspecified: Secondary | ICD-10-CM | POA: Diagnosis not present

## 2018-11-07 DIAGNOSIS — E039 Hypothyroidism, unspecified: Secondary | ICD-10-CM | POA: Diagnosis not present

## 2018-11-07 DIAGNOSIS — G629 Polyneuropathy, unspecified: Secondary | ICD-10-CM | POA: Diagnosis not present

## 2018-11-07 DIAGNOSIS — R42 Dizziness and giddiness: Secondary | ICD-10-CM | POA: Diagnosis not present

## 2018-11-14 ENCOUNTER — Other Ambulatory Visit
Admission: RE | Admit: 2018-11-14 | Discharge: 2018-11-14 | Disposition: A | Discharge status: Home / Self Care | Payer: PPO | Source: Home / Self Care | Attending: Family Medicine | Admitting: Family Medicine

## 2018-11-14 ENCOUNTER — Other Ambulatory Visit: Payer: Self-pay

## 2018-11-14 ENCOUNTER — Ambulatory Visit (INDEPENDENT_AMBULATORY_CARE_PROVIDER_SITE_OTHER): Payer: PPO | Admitting: Internal Medicine

## 2018-11-14 ENCOUNTER — Other Ambulatory Visit
Admission: RE | Admit: 2018-11-14 | Discharge: 2018-11-14 | Disposition: A | Discharge status: Home / Self Care | Payer: PPO | Attending: Rheumatology | Admitting: Rheumatology

## 2018-11-14 ENCOUNTER — Encounter: Payer: Self-pay | Admitting: Internal Medicine

## 2018-11-14 VITALS — BP 148/84 | HR 53 | Ht 64.0 in | Wt 198.0 lb

## 2018-11-14 DIAGNOSIS — Z79899 Other long term (current) drug therapy: Secondary | ICD-10-CM | POA: Insufficient documentation

## 2018-11-14 DIAGNOSIS — R42 Dizziness and giddiness: Secondary | ICD-10-CM

## 2018-11-14 DIAGNOSIS — M0589 Other rheumatoid arthritis with rheumatoid factor of multiple sites: Secondary | ICD-10-CM | POA: Insufficient documentation

## 2018-11-14 DIAGNOSIS — R0989 Other specified symptoms and signs involving the circulatory and respiratory systems: Secondary | ICD-10-CM | POA: Diagnosis not present

## 2018-11-14 DIAGNOSIS — I480 Paroxysmal atrial fibrillation: Secondary | ICD-10-CM | POA: Diagnosis not present

## 2018-11-14 LAB — C-REACTIVE PROTEIN: CRP: <0.8 mg/dL (ref <1.0)

## 2018-11-14 LAB — CBC WITH DIFFERENTIAL/PLATELET
Abs Immature Granulocytes: 0.02 10*3/uL (ref 0.00–0.07)
Basophils Absolute: 0.1 10*3/uL (ref 0.0–0.1)
Basophils Relative: 1 %
Eosinophils Absolute: 0.2 10*3/uL (ref 0.0–0.5)
Eosinophils Relative: 4 %
HCT: 37.9 % (ref 36.0–46.0)
Hemoglobin: 13 g/dL (ref 12.0–15.0)
Immature Granulocytes: 0 %
Lymphocytes Relative: 27 %
Lymphs Abs: 1.6 10*3/uL (ref 0.7–4.0)
MCH: 33.8 pg (ref 26.0–34.0)
MCHC: 34.3 g/dL (ref 30.0–36.0)
MCV: 98.4 fL (ref 80.0–100.0)
Monocytes Absolute: 0.4 10*3/uL (ref 0.1–1.0)
Monocytes Relative: 8 %
Neutro Abs: 3.5 10*3/uL (ref 1.7–7.7)
Neutrophils Relative %: 60 %
Platelets: 265 10*3/uL (ref 150–400)
RBC: 3.85 MIL/uL — ABNORMAL LOW (ref 3.87–5.11)
RDW: 14.1 % (ref 11.5–15.5)
WBC: 5.8 10*3/uL (ref 4.0–10.5)
nRBC: 0 % (ref 0.0–0.2)

## 2018-11-14 LAB — COMPREHENSIVE METABOLIC PANEL
ALT: 23 U/L (ref 0–44)
AST: 28 U/L (ref 15–41)
Albumin: 4.1 g/dL (ref 3.5–5.0)
Alkaline Phosphatase: 55 U/L (ref 38–126)
Anion gap: 10 (ref 5–15)
BUN: 14 mg/dL (ref 8–23)
CO2: 26 mmol/L (ref 22–32)
Calcium: 9.6 mg/dL (ref 8.9–10.3)
Chloride: 99 mmol/L (ref 98–111)
Creatinine, Ser: 0.55 mg/dL (ref 0.44–1.00)
GFR calc Af Amer: >60 mL/min (ref >60)
GFR calc non Af Amer: >60 mL/min (ref >60)
Glucose, Bld: 93 mg/dL (ref 70–99)
Potassium: 3.9 mmol/L (ref 3.5–5.1)
Sodium: 135 mmol/L (ref 135–145)
Total Bilirubin: 1 mg/dL (ref 0.3–1.2)
Total Protein: 7.1 g/dL (ref 6.5–8.1)

## 2018-11-14 LAB — SEDIMENTATION RATE: Sed Rate: 27 mm/hr (ref 0–30)

## 2018-11-14 LAB — TSH: TSH: 1.386 u[IU]/mL (ref 0.350–4.500)

## 2018-11-14 LAB — URIC ACID: Uric Acid, Serum: 5.5 mg/dL (ref 2.5–7.1)

## 2018-11-14 MED ORDER — METOPROLOL SUCCINATE ER 25 MG PO TB24: 25.0000 mg | ORAL_TABLET | Freq: Every day | ORAL | 1 refills | Status: DC

## 2018-11-14 NOTE — Progress Notes (Signed)
Follow-up Outpatient Visit Date: 11/14/2018  Primary Care Provider: Isaias Sakai, [place] [address]  Chief Complaint: Follow-up hypertension  HPI:  Jessica Kaufman is a 85 y.o. year-old female with history of labile hypertension, paroxysmal atrial fibrillation, aortic valve disease, PVCs, hyperlipidemia, and rheumatoid arthritis, who presents for follow-up of hypertension.  We last spoke via virtual visit in late September, at which time Jessica Kaufman noted that her blood pressure was running high (up to 212/111).  She has been compliant with her medications but had eaten a ham biscuit 3 days earlier with subsequent rise in her blood pressure.  I advised her to take her afternoon dose of losartan early if her blood pressures are elevated in the morning.  We did not make any other medication changes.  Today, Jessica Kaufman feels well other than dizziness.  She describes it as feeling off balance and woozy.  She is scheduled to see Dr. Pryor Ochoa (ENT) tomorrow.  She notes that her blood pressure has been generally under better control though it continues to fluctuate.  Most systolic readings are in the 130 to 160 mmHg range.  She denies chest pain, shortness of breath, and palpitations.  She is avoiding sodium.  --------------------------------------------------------------------------------------------------  Past Medical History:  Diagnosis Date  . Aortic valve disease   . Asthma   . Cancer ([place])    basal cell   . Gout   . Hyperlipidemia   . Hypertension   . Hypertensive heart disease without heart failure   . Hypothyroidism   . PAF (paroxysmal atrial fibrillation) ([place])   . PVC (premature ventricular contraction)   . Rheumatoid arthritis ([place])   . Ventricular premature depolarization    Past Surgical History:  Procedure Laterality Date  . BLADDER SURGERY  10/19/2010  . BONY PELVIS SURGERY  1990  . BREAST BIOPSY Left 1986  . CARDIAC SURGERY  2009    PVC  . CATARACT EXTRACTION Left 11/15/00  . CATARACT EXTRACTION Right   . ELBOW SURGERY  06/1990  . HIP SURGERY Left 05/28/09  . HIP SURGERY Right 06/30/03  . HYSTEROSCOPY  06/17/1986   vaginal    Current Meds  Medication Sig  . acetaminophen (TYLENOL ARTHRITIS PAIN) 650 MG CR tablet Take 650 mg by mouth every 8 (eight) hours as needed for pain.  Marland Kitchen allopurinol (ZYLOPRIM) 300 MG tablet Take 1 tablet by mouth every other day.   Marland Kitchen apixaban (ELIQUIS) 5 MG TABS tablet Take 5 mg by mouth 2 (two) times daily.   Marland Kitchen b complex vitamins tablet Take 1 tablet by mouth daily.  . Bilberry, Vaccinium myrtillus, 1000 MG CAPS Take 2 capsules by mouth daily.  Marland Kitchen Bioflavonoid Products (BIOFLEX) TABS Take 2 tablets by mouth daily.  . Calcium Carb-Cholecalciferol (OYSTER SHELL CALCIUM/VITAMIN D PO) daily.   . Cholecalciferol (VITAMIN D3) 1000 UNITS CAPS Take 1 capsule by mouth daily.  . Coenzyme Q10 (COQ10) 100 MG CAPS Take 1 capsule by mouth daily.  Marland Kitchen CRANBERRY PO Take 1 tablet by mouth daily.  Mariane Baumgarten Calcium (STOOL SOFTENER PO) Take by mouth as needed.  Marland Kitchen estrogens, conjugated, (PREMARIN) 0.625 MG tablet Place 0.625 mg vaginally 2 (two) times a week. Take daily for 21 days then do not take for 7 days.  . fexofenadine (ALLEGRA) 180 MG tablet Take 180 mg by mouth daily.  . folic acid (FOLVITE) 1 MG tablet Take by mouth daily.   Marland Kitchen gabapentin (NEURONTIN) 300 MG capsule Take 300 mg by  mouth 2 (two) times daily.   . Ginger, Zingiber officinalis, (GINGER ROOT PO) Take by mouth daily.  . lansoprazole (PREVACID) 15 MG capsule Take 15 mg by mouth daily as needed.  Marland Kitchen levothyroxine (SYNTHROID, LEVOTHROID) 50 MCG tablet Take 1 tablet by mouth daily.  . Magnesium Oxide (PHILLIPS PO) Take by mouth daily as needed.  . methotrexate (RHEUMATREX) 2.5 MG tablet Take by mouth. Takes 7 tablets weekly.  . metoprolol succinate (TOPROL-XL) 50 MG 24 hr tablet Take 50 mg by mouth daily.   . Multiple Vitamins-Minerals (CENTRUM  SILVER PO) Take 1 tablet by mouth daily.  . Multiple Vitamins-Minerals (PRESERVISION AREDS 2 PO) Take 1 capsule by mouth 2 (two) times daily.   . Multiple Vitamins-Minerals (PRESERVISION AREDS PO) Take by mouth daily.  . Omega-3 Fatty Acids (FISH OIL) 1000 MG CAPS Take 2 capsules by mouth daily.  Marland Kitchen oxybutynin (DITROPAN-XL) 10 MG 24 hr tablet Take 10 mg by mouth daily.  . predniSONE (DELTASONE) 5 MG tablet Take 5 mg by mouth as directed.   . psyllium (METAMUCIL) 58.6 % powder Take 1 packet by mouth daily.  . TURMERIC PO Take by mouth daily.    Allergies: Metronidazole, Sulindac, and Naproxen  Social History   Tobacco Use  . Smoking status: Never Smoker  . Smokeless tobacco: Never Used  Substance Use Topics  . Alcohol use: No  . Drug use: No    Family History  Problem Relation Age of Onset  . Stroke Mother   . Melanoma Mother   . Heart attack Father   . Uterine cancer Sister   . Melanoma Sister   . Heart failure Sister   . Heart attack Maternal Grandmother   . Breast cancer Daughter     Review of Systems: A 12-system review of systems was performed and was negative except as noted in the HPI.  --------------------------------------------------------------------------------------------------  Physical Exam: BP (!) 148/84 (BP Location: Left Arm, Patient Position: Sitting, Cuff Size: Normal)   Pulse (!) 53   Ht 5\' 4"  (1.626 m)   Wt 198 lb (89.8 kg)   SpO2 96%   BMI 33.99 kg/m   General: NAD.  Accompanied by her daughter. HEENT: No conjunctival pallor or scleral icterus.  Facemask in place. Neck: Supple without lymphadenopathy, thyromegaly, JVD, or HJR. Lungs: Normal work of breathing. Clear to auscultation bilaterally without wheezes or crackles. Heart: Bradycardic but regular with 2/6 systolic murmur.  No rubs or gallops. Abd: Bowel sounds present. Soft, NT/ND without hepatosplenomegaly Ext: No lower extremity edema. Radial, PT, and DP pulses are 2+ bilaterally.  Skin: Warm and dry without rash.  EKG: Sinus bradycardia (heart rate 53 bpm) with borderline LVH and nonspecific ST/T changes.  No significant change from prior tracing on 12/11/2017.  Lab Results  Component Value Date   WBC 6.2 04/25/2018   HGB 13.2 04/25/2018   HCT 39.9 04/25/2018   MCV 99.8 04/25/2018   PLT 295 04/25/2018    Lab Results  Component Value Date   NA 136 04/25/2018   K 3.6 04/25/2018   CL 99 04/25/2018   CO2 26 04/25/2018   BUN 22 04/25/2018   CREATININE 0.63 04/25/2018   GLUCOSE 107 (H) 04/25/2018   ALT 27 04/25/2018    No results found for: CHOL, HDL, LDLCALC, LDLDIRECT, TRIG, CHOLHDL  --------------------------------------------------------------------------------------------------  ASSESSMENT AND PLAN: Hypertension: Blood pressure is mildly elevated today with continued labile readings at home.  Blood pressure trend has improved since we last spoke.  I suspect avoidance  of sodium is helping.  I am concerned by Jessica Kaufman bradycardia and dizziness.  We will therefore decrease metoprolol succinate to 25 mg nightly.  If her blood pressure trends up further, we may need to alter her other antihypertensive medications.  We will defer other changes for now.  PAF: No evidence of recurrence.  We will decrease metoprolol, as above.  Jessica Kaufman should continue apixaban.  Dizziness: This certainly could be due to bradycardia or some other noncardiac etiology.  I think it is reasonable for Jessica Kaufman to see Dr. Pryor Ochoa as scheduled.  We will also decrease metoprolol to 25 mg daily.  Follow-up: Return to clinic in 3 months.  Nelva Bush, MD 11/14/2018 11:27 AM

## 2018-11-14 NOTE — Patient Instructions (Signed)
Medication Instructions:  Your physician has recommended you make the following change in your medication:  1- DECREASE Metoprolol to 25 mg by mouth daily every night.   *If you need a refill on your cardiac medications before your next appointment, please call your pharmacy*  Lab Work: none If you have labs (blood work) drawn today and your tests are completely normal, you will receive your results only by: Marland Kitchen MyChart Message (if you have MyChart) OR . A paper copy in the mail If you have any lab test that is abnormal or we need to change your treatment, we will call you to review the results.  Testing/Procedures: none  Follow-Up: At [facility], you and your health needs are our priority.  As part of our continuing mission to provide you with exceptional heart care, we have created designated Provider Care Teams.  These Care Teams include your primary Cardiologist (physician) and Advanced Practice Providers (APPs -  Physician Assistants and Nurse Practitioners) who all work together to provide you with the care you need, when you need it.  Your next appointment:   3 months  The format for your next appointment:   In Person  Provider:    You may see Nelva Bush, MD or one of the following Advanced Practice Providers on your designated Care Team:    Murray Hodgkins, NP  Christell Faith, PA-C  Marrianne Mood, PA-C

## 2018-11-15 ENCOUNTER — Encounter: Payer: Self-pay | Admitting: Internal Medicine

## 2018-11-15 DIAGNOSIS — R42 Dizziness and giddiness: Secondary | ICD-10-CM | POA: Diagnosis not present

## 2018-11-15 DIAGNOSIS — H6063 Unspecified chronic otitis externa, bilateral: Secondary | ICD-10-CM | POA: Diagnosis not present

## 2018-11-22 ENCOUNTER — Telehealth: Payer: PPO | Admitting: Internal Medicine

## 2018-11-28 DIAGNOSIS — G4733 Obstructive sleep apnea (adult) (pediatric): Secondary | ICD-10-CM | POA: Diagnosis not present

## 2018-12-13 DIAGNOSIS — H903 Sensorineural hearing loss, bilateral: Secondary | ICD-10-CM | POA: Diagnosis not present

## 2018-12-13 DIAGNOSIS — R42 Dizziness and giddiness: Secondary | ICD-10-CM | POA: Diagnosis not present

## 2018-12-18 DIAGNOSIS — R42 Dizziness and giddiness: Secondary | ICD-10-CM | POA: Diagnosis not present

## 2018-12-26 ENCOUNTER — Other Ambulatory Visit: Payer: Self-pay

## 2018-12-26 MED ORDER — HYDROCHLOROTHIAZIDE 25 MG PO TABS: 25.0000 mg | ORAL_TABLET | Freq: Every day | ORAL | 0 refills | Status: DC

## 2018-12-26 MED ORDER — LOSARTAN POTASSIUM 50 MG PO TABS: 50.0000 mg | ORAL_TABLET | Freq: Two times a day (BID) | ORAL | 0 refills | Status: DC

## 2019-01-02 ENCOUNTER — Telehealth: Payer: Self-pay | Admitting: Internal Medicine

## 2019-01-02 ENCOUNTER — Other Ambulatory Visit: Payer: Self-pay

## 2019-01-02 MED ORDER — METOPROLOL SUCCINATE ER 25 MG PO TB24: 25.0000 mg | ORAL_TABLET | Freq: Every day | ORAL | 1 refills | Status: DC

## 2019-01-02 NOTE — Telephone Encounter (Signed)
metoprolol succinate (TOPROL XL) 25 MG 24 hr tablet 90 tablet 1 01/02/2019    Sig - Route: Take 1 tablet (25 mg total) by mouth at bedtime. - Oral   Sent to pharmacy as: metoprolol succinate (TOPROL XL) 25 MG 24 hr tablet   E-Prescribing Status: Sent to pharmacy (01/02/2019  4:16 PM EST)   Pharmacy  [facility] [place], [place]

## 2019-01-02 NOTE — Telephone Encounter (Signed)
*  STAT* If patient is at the pharmacy, call can be transferred to refill team.   1. Which medications need to be refilled? (please list name of each medication and dose if known) Metoprolol 25 mg po QHS  2. Which pharmacy/location (including street and city if local pharmacy) is medication to be sent to? [facility] mail order   3. Do they need a 30 day or 90 day supply? [place]

## 2019-01-07 DIAGNOSIS — M79643 Pain in unspecified hand: Secondary | ICD-10-CM | POA: Diagnosis not present

## 2019-01-07 DIAGNOSIS — Z79899 Other long term (current) drug therapy: Secondary | ICD-10-CM | POA: Diagnosis not present

## 2019-01-07 DIAGNOSIS — M1990 Unspecified osteoarthritis, unspecified site: Secondary | ICD-10-CM | POA: Diagnosis not present

## 2019-01-07 DIAGNOSIS — M0589 Other rheumatoid arthritis with rheumatoid factor of multiple sites: Secondary | ICD-10-CM | POA: Diagnosis not present

## 2019-01-07 DIAGNOSIS — M6530 Trigger finger, unspecified finger: Secondary | ICD-10-CM | POA: Diagnosis not present

## 2019-01-07 DIAGNOSIS — E669 Obesity, unspecified: Secondary | ICD-10-CM | POA: Diagnosis not present

## 2019-01-18 ENCOUNTER — Ambulatory Visit: Payer: PPO

## 2019-02-08 ENCOUNTER — Ambulatory Visit: Payer: PPO

## 2019-02-20 ENCOUNTER — Encounter: Payer: Self-pay | Admitting: Internal Medicine

## 2019-02-20 ENCOUNTER — Telehealth (INDEPENDENT_AMBULATORY_CARE_PROVIDER_SITE_OTHER): Payer: PPO | Admitting: Internal Medicine

## 2019-02-20 ENCOUNTER — Telehealth: Payer: Self-pay | Admitting: *Deleted

## 2019-02-20 ENCOUNTER — Other Ambulatory Visit: Payer: Self-pay

## 2019-02-20 VITALS — BP 140/84 | HR 62 | Ht 64.0 in | Wt 203.0 lb

## 2019-02-20 DIAGNOSIS — R0989 Other specified symptoms and signs involving the circulatory and respiratory systems: Secondary | ICD-10-CM | POA: Diagnosis not present

## 2019-02-20 DIAGNOSIS — R55 Syncope and collapse: Secondary | ICD-10-CM | POA: Diagnosis not present

## 2019-02-20 DIAGNOSIS — I480 Paroxysmal atrial fibrillation: Secondary | ICD-10-CM | POA: Diagnosis not present

## 2019-02-20 NOTE — Patient Instructions (Signed)
Medication Instructions:  Your physician recommends that you continue on your current medications as directed. Please refer to the Current Medication list given to you today.  *If you need a refill on your cardiac medications before your next appointment, please call your pharmacy*  Lab Work: none If you have labs (blood work) drawn today and your tests are completely normal, you will receive your results only by: Marland Kitchen MyChart Message (if you have MyChart) OR . A paper copy in the mail If you have any lab test that is abnormal or we need to change your treatment, we will call you to review the results.  Testing/Procedures: 1- ECHOCARDIOGRAM - Your physician has requested that you have an echocardiogram. Echocardiography is a painless test that uses sound waves to create images of your heart. It provides your doctor with information about the size and shape of your heart and how well your heart's chambers and valves are working. This procedure takes approximately one hour. There are no restrictions for this procedure. You may get an IV, if needed, to receive an ultrasound enhancing agent through to better visualize your heart.    2- ZIO MONITOR for 14 days - Your physician has recommended that you wear a Zio monitor. This monitor is a medical device that records the heart's electrical activity. Doctors most often use these monitors to diagnose arrhythmias. Arrhythmias are problems with the speed or rhythm of the heartbeat. The monitor is a small device applied to your chest. You can wear one while you do your normal daily activities. While wearing this monitor if you have any symptoms to push the button and record what you felt. Once you have worn this monitor for the period of time provider prescribed (Usually 14 days), you will return the monitor device in the postage paid box. Once it is returned they will download the data collected and provide [place] with a report which the provider will then review  and we will call you with those results. Important tips:  1. Avoid showering during the first 24 hours of wearing the monitor. 2. Avoid excessive sweating to help maximize wear time. 3. Do not submerge the device, no hot tubs, and no swimming pools. 4. Keep any lotions or oils away from the patch. 5. After 24 hours you may shower with the patch on. Take brief showers with your back facing the shower head.  6. Do not remove patch once it has been placed because that will interrupt data and decrease adhesive wear time. 7. Push the button when you have any symptoms and write down what you were feeling. 8. Once you have completed wearing your monitor, remove and place into box which has postage paid and place in your outgoing mailbox.  9. If for some reason you have misplaced your box then call our office and we can provide another box and/or mail it off for you.      Follow-Up: At [facility], you and your health needs are our priority.  As part of our continuing mission to provide you with exceptional heart care, we have created designated Provider Care Teams.  These Care Teams include your primary Cardiologist (physician) and Advanced Practice Providers (APPs -  Physician Assistants and Nurse Practitioners) who all work together to provide you with the care you need, when you need it.  Your next appointment:   6 week(s)  The format for your next appointment:   In Person if possible but ok to switch to virtual if  needed  Provider:    You may see Nelva Bush, MD or one of the following Advanced Practice Providers on your designated Care Team:    Murray Hodgkins, NP  Christell Faith, PA-C  Marrianne Mood, PA-C

## 2019-02-20 NOTE — Telephone Encounter (Signed)
Spoke with patient's daughter, [name], ok per DPR. Went over the AVS from Virtual Visit today with Dr End and she verbalized understanding.  Called patient and she verbalized understanding of AVS as well.  ZIO to be mailed to patient and registered on website.

## 2019-02-20 NOTE — Progress Notes (Signed)
Virtual Visit via Telephone Note   This visit type was conducted due to national recommendations for restrictions regarding the COVID-19 Pandemic (e.g. social distancing) in an effort to limit this patient's exposure and mitigate transmission in our community.  Due to her co-morbid illnesses, this patient is at least at moderate risk for complications without adequate follow up.  This format is felt to be most appropriate for this patient at this time.  The patient did not have access to video technology/had technical difficulties with video requiring transitioning to audio format only (telephone).  All issues noted in this document were discussed and addressed.  No physical exam could be performed with this format.  Please refer to the patient's chart for her  consent to telehealth for [facility].   Date:  02/20/2019   ID:  Jessica Kaufman, DOB 03-Apr-1925, MRN [id number]  Patient Location: Home Provider Location: Office  PCP:  Leonides Sake, MD  Cardiologist:  Nelva Bush, MD  Electrophysiologist:  None   Evaluation Performed:  Follow-Up Visit  Chief Complaint:  Dizziness  History of Present Illness:    Jessica Kaufman is a 86 y.o. female with labile hypertension, paroxysmal atrial fibrillation, aortic valve disease, PVCs, hyperlipidemia, and rheumatoid arthritis.  We are speaking today for follow-up of blood pressure and dizziness.  I last saw Jessica Kaufman in the office in 11/2018, at which time she reported occasional wooziness and feeling off balance.  She was already scheduled to see ENT the following day.  Blood pressure control had been better than when we last spoke with most systolic readings in the [id number] to 160 mmHg range.  Today, his [place] reports feeling relatively well.  She still has some dizziness and at times is felt as though she might pass out.  She has not fallen or lost consciousness.  Blood pressure has been somewhat labile but overall relatively stable  with readings between 140-160/80-95 mmHg.  She has been tolerating her current regimen of HCTZ, losartan, and metoprolol well.  She continues to work with the ENT for management of her chronic vertigo.  She has not had chest pain or shortness of breath.  She has mild lower extremity edema, worse on the left.  This seems to have worsened recently after injuring the left ankle.  Weight is up a few pounds over the last couple of weeks.  She has been trying to minimize her sodium intake.  The patient does not have symptoms concerning for COVID-19 infection (fever, chills, cough, or new shortness of breath).    Past Medical History:  Diagnosis Date  . Aortic valve disease   . Asthma   . Cancer ([place])    basal cell   . Gout   . Hyperlipidemia   . Hypertension   . Hypertensive heart disease without heart failure   . Hypothyroidism   . PAF (paroxysmal atrial fibrillation) ([place])   . PVC (premature ventricular contraction)   . Rheumatoid arthritis ([place])   . Ventricular premature depolarization    Past Surgical History:  Procedure Laterality Date  . BLADDER SURGERY  10/19/2010  . BONY PELVIS SURGERY  1990  . BREAST BIOPSY Left 1986  . CARDIAC SURGERY  2009   PVC  . CATARACT EXTRACTION Left 11/15/00  . CATARACT EXTRACTION Right   . ELBOW SURGERY  06/1990  . HIP SURGERY Left 05/28/09  . HIP SURGERY Right 06/30/03  . HYSTEROSCOPY  06/17/1986   vaginal     Current Meds  Medication Sig  . acetaminophen (TYLENOL ARTHRITIS PAIN) 650 MG CR tablet Take 650 mg by mouth every 8 (eight) hours as needed for pain.  Marland Kitchen allopurinol (ZYLOPRIM) 300 MG tablet Take 1 tablet by mouth every other day.   Marland Kitchen apixaban (ELIQUIS) 5 MG TABS tablet Take 5 mg by mouth 2 (two) times daily.   Marland Kitchen b complex vitamins tablet Take 1 tablet by mouth daily.  . Bilberry, Vaccinium myrtillus, 1000 MG CAPS Take 2 capsules by mouth daily.  Marland Kitchen Bioflavonoid Products (BIOFLEX) TABS Take 2 tablets by mouth daily.  . Calcium  Carb-Cholecalciferol (OYSTER SHELL CALCIUM/VITAMIN D PO) daily.   . Cholecalciferol (VITAMIN D3) 1000 UNITS CAPS Take 1 capsule by mouth daily.  . Coenzyme Q10 (COQ10) 100 MG CAPS Take 1 capsule by mouth daily.  Marland Kitchen CRANBERRY PO Take 1 tablet by mouth daily.  Jessica Kaufman Calcium (STOOL SOFTENER PO) Take by mouth as needed.  Marland Kitchen estrogens, conjugated, (PREMARIN) 0.625 MG tablet Place 0.625 mg vaginally 2 (two) times a week. Take daily for 21 days then do not take for 7 days.  . fexofenadine (ALLEGRA) 180 MG tablet Take 180 mg by mouth daily.  . folic acid (FOLVITE) 1 MG tablet Take by mouth daily.   Marland Kitchen gabapentin (NEURONTIN) 300 MG capsule Take 300 mg by mouth 2 (two) times daily.   . Ginger, Zingiber officinalis, (GINGER ROOT PO) Take by mouth daily.  . hydrochlorothiazide (HYDRODIURIL) 25 MG tablet Take 1 tablet (25 mg total) by mouth daily.  . lansoprazole (PREVACID) 15 MG capsule Take 15 mg by mouth daily as needed.  Marland Kitchen levothyroxine (SYNTHROID, LEVOTHROID) 50 MCG tablet Take 1 tablet by mouth daily.  Marland Kitchen losartan (COZAAR) 50 MG tablet Take 1 tablet (50 mg total) by mouth 2 (two) times daily.  . Magnesium Oxide (PHILLIPS PO) Take by mouth daily as needed.  . methotrexate (RHEUMATREX) 2.5 MG tablet Take by mouth. Takes 7 tablets weekly.  . metoprolol succinate (TOPROL XL) 25 MG 24 hr tablet Take 1 tablet (25 mg total) by mouth at bedtime.  . Multiple Vitamins-Minerals (CENTRUM SILVER PO) Take 1 tablet by mouth daily.  . Multiple Vitamins-Minerals (PRESERVISION AREDS 2 PO) Take 1 capsule by mouth 2 (two) times daily.   . Omega-3 Fatty Acids (FISH OIL) 1000 MG CAPS Take 2 capsules by mouth daily.  Marland Kitchen oxybutynin (DITROPAN-XL) 10 MG 24 hr tablet Take 10 mg by mouth daily.  . predniSONE (DELTASONE) 5 MG tablet Take 5 mg by mouth as directed.   Marland Kitchen PREMARIN vaginal cream Place 1 Applicatorful vaginally in the morning and at bedtime.   . psyllium (METAMUCIL) 58.6 % powder Take 1 packet by mouth daily.  .  TURMERIC PO Take by mouth daily.     Allergies:   Metronidazole, Sulindac, and Naproxen   Social History   Tobacco Use  . Smoking status: Never Smoker  . Smokeless tobacco: Never Used  Substance Use Topics  . Alcohol use: No  . Drug use: No     Family Hx: The patient's family history includes Breast cancer in her daughter; Heart attack in her father and maternal grandmother; Heart failure in her sister; Melanoma in her mother and sister; Stroke in her mother; Uterine cancer in her sister.  ROS:   Please see the history of present illness.   All other systems reviewed and are negative.   Prior CV studies:   The following studies were reviewed today:  Carotid Doppler (04/28/2017): Mild disease with 1-39% stenoses  in both internal carotid arteries.  Labs/Other Tests and Data Reviewed:    EKG:  No ECG reviewed.  Recent Labs: 11/14/2018: ALT 23; BUN 14; Creatinine, Ser 0.55; Hemoglobin 13.0; Platelets 265; Potassium 3.9; Sodium 135; TSH 1.386   Recent Lipid Panel No results found for: CHOL, TRIG, HDL, CHOLHDL, LDLCALC, LDLDIRECT  Wt Readings from Last 3 Encounters:  02/20/19 203 lb (92.1 kg)  11/14/18 198 lb (89.8 kg)  09/24/18 197 lb (89.4 kg)     Objective:    Vital Signs:  BP 140/84 (BP Location: Left Arm, Patient Position: Sitting, Cuff Size: Normal)   Pulse 62   Ht 5\' 4"  (1.626 m)   Wt 203 lb (92.1 kg)   BMI 34.84 kg/m    VITAL SIGNS:  reviewed  ASSESSMENT & PLAN:    Hypertension: Blood pressure remains mildly elevated but overall is relatively stable.  Given issues with lightheadedness, we have agreed to defer medication changes at this time.  Continue to low-sodium diet was reinforced.  Paroxysmal atrial fibrillation and near syncope: Jessica Kaufman has a long history of dizziness, though she also has a component of presyncope which is different from her chronic vertigo.  Given history of atrial fibrillation and valvular heart disease, I have recommended  that we obtain a 14-day event monitor and transthoracic echocardiogram for further evaluation.  We will continue anticoagulation with apixaban as well as rate control with metoprolol.  COVID-19 Education: The signs and symptoms of COVID-19 were discussed with the patient and how to seek care for testing (follow up with PCP or arrange E-visit).  The importance of social distancing was discussed today.  Time:   Today, I have spent 15 minutes with the patient with telehealth technology discussing the above problems.     Medication Adjustments/Labs and Tests Ordered: Current medicines are reviewed at length with the patient today.  Concerns regarding medicines are outlined above.   Tests Ordered: Orders Placed This Encounter  Procedures  . LONG TERM MONITOR (3-14 DAYS)  . ECHOCARDIOGRAM COMPLETE    Medication Changes: None.  Follow Up:  In Person in 6 week(s)  Signed, Nelva Bush, MD  02/20/2019 1:24 PM    [facility]

## 2019-02-21 ENCOUNTER — Telehealth: Payer: Self-pay | Admitting: Internal Medicine

## 2019-02-21 NOTE — Telephone Encounter (Signed)
lmov to schedule echo and 6 week fu

## 2019-02-25 ENCOUNTER — Ambulatory Visit (INDEPENDENT_AMBULATORY_CARE_PROVIDER_SITE_OTHER): Payer: PPO

## 2019-02-25 DIAGNOSIS — I480 Paroxysmal atrial fibrillation: Secondary | ICD-10-CM

## 2019-02-25 DIAGNOSIS — R55 Syncope and collapse: Secondary | ICD-10-CM | POA: Diagnosis not present

## 2019-02-26 ENCOUNTER — Other Ambulatory Visit: Payer: Self-pay

## 2019-02-26 MED ORDER — HYDROCHLOROTHIAZIDE 25 MG PO TABS: 25.0000 mg | ORAL_TABLET | Freq: Every day | ORAL | 3 refills | Status: DC

## 2019-02-28 ENCOUNTER — Other Ambulatory Visit: Payer: Self-pay | Admitting: *Deleted

## 2019-02-28 DIAGNOSIS — D2239 Melanocytic nevi of other parts of face: Secondary | ICD-10-CM | POA: Diagnosis not present

## 2019-02-28 DIAGNOSIS — D225 Melanocytic nevi of trunk: Secondary | ICD-10-CM | POA: Diagnosis not present

## 2019-02-28 DIAGNOSIS — L97921 Non-pressure chronic ulcer of unspecified part of left lower leg limited to breakdown of skin: Secondary | ICD-10-CM | POA: Diagnosis not present

## 2019-02-28 DIAGNOSIS — D1801 Hemangioma of skin and subcutaneous tissue: Secondary | ICD-10-CM | POA: Diagnosis not present

## 2019-02-28 MED ORDER — HYDROCHLOROTHIAZIDE 25 MG PO TABS: 25.0000 mg | ORAL_TABLET | Freq: Every day | ORAL | 3 refills | Status: DC

## 2019-02-28 MED ORDER — LOSARTAN POTASSIUM 50 MG PO TABS: 50.0000 mg | ORAL_TABLET | Freq: Two times a day (BID) | ORAL | 0 refills | Status: DC

## 2019-02-28 NOTE — Telephone Encounter (Signed)
Requested Prescriptions   Pending Prescriptions Disp Refills  . hydrochlorothiazide (HYDRODIURIL) 25 MG tablet 90 tablet 3    Sig: Take 1 tablet (25 mg total) by mouth daily.   Signed Prescriptions Disp Refills  . losartan (COZAAR) 50 MG tablet 180 tablet 0    Sig: Take 1 tablet (50 mg total) by mouth 2 (two) times daily.    Authorizing Provider: END, CHRISTOPHER    Ordering User: Britt Bottom

## 2019-03-05 ENCOUNTER — Other Ambulatory Visit: Payer: Self-pay

## 2019-03-05 ENCOUNTER — Ambulatory Visit: Payer: PPO | Attending: Otolaryngology | Admitting: Physical Therapy

## 2019-03-05 ENCOUNTER — Encounter: Payer: Self-pay | Admitting: Physical Therapy

## 2019-03-05 DIAGNOSIS — R42 Dizziness and giddiness: Secondary | ICD-10-CM | POA: Insufficient documentation

## 2019-03-05 DIAGNOSIS — R2681 Unsteadiness on feet: Secondary | ICD-10-CM | POA: Diagnosis not present

## 2019-03-05 DIAGNOSIS — R262 Difficulty in walking, not elsewhere classified: Secondary | ICD-10-CM | POA: Diagnosis not present

## 2019-03-05 NOTE — Therapy (Signed)
[place] MAIN [facility] SERVICES [address] [place], [place], [address] Phone: [phone]   Fax:  [phone]  Physical Therapy Evaluation  Patient Details  Name: Jessica Kaufman MRN: [id number] Date of Birth: 13-Mar-1925 Referring Provider (PT): Dr. Carmin Richmond   Encounter Date: 03/05/2019  PT End of Session - 03/06/19 1411    Visit Number  1    Number of Visits  9    Date for PT Re-Evaluation  04/30/19    PT Start Time  1116    PT Stop Time  1219    PT Time Calculation (min)  63 min    Equipment Utilized During Treatment  Gait belt    Activity Tolerance  Patient tolerated treatment well    Behavior During Therapy  [facility] for tasks assessed/performed       Past Medical History:  Diagnosis Date  . Aortic valve disease   . Asthma   . Cancer ([place])    basal cell   . Gout   . Hyperlipidemia   . Hypertension   . Hypertensive heart disease without heart failure   . Hypothyroidism   . PAF (paroxysmal atrial fibrillation) ([place])   . PVC (premature ventricular contraction)   . Rheumatoid arthritis ([place])   . Ventricular premature depolarization     Past Surgical History:  Procedure Laterality Date  . BLADDER SURGERY  10/19/2010  . BONY PELVIS SURGERY  1990  . BREAST BIOPSY Left 1986  . CARDIAC SURGERY  2009   PVC  . CATARACT EXTRACTION Left 11/15/00  . CATARACT EXTRACTION Right   . ELBOW SURGERY  06/1990  . HIP SURGERY Left 05/28/09  . HIP SURGERY Right 06/30/03  . HYSTEROSCOPY  06/17/1986   vaginal    There were no vitals filed for this visit.   [facility] PT Assessment - 03/06/19 1240      Assessment   Medical Diagnosis  dizziness and giddiness    Referring Provider (PT)  Dr. Carmin Richmond    Prior Therapy  vestibular therapy in the past which patient reports helped      Precautions   Precautions  Fall      Restrictions   Weight Bearing Restrictions  No      Balance Screen   Has the patient fallen in the past 6 months  Yes     How many times?  1    Has the patient had a decrease in activity level because of a fear of falling?   Yes    Is the patient reluctant to leave their home because of a fear of falling?   No      Home Social worker  Private residence    Living Arrangements  Alone    Available Help at Discharge  Family;Friend(s)    Type of [place] Access  Level entry    [place]  Multi-level;Bed/bath upstairs    [place] - 2 wheels;Cane - single point      Prior Function   Level of Independence  Independent with community mobility with device      Cognition   Overall Cognitive Status  Within Functional Limits for tasks assessed      Transfers   Transfers  Sit to Stand      Standardized Balance Assessment   Standardized Balance Assessment  Berg Balance Test;Dynamic Gait Index  Dynamic Gait Index   Level Surface  Mild Impairment    Change in Gait Speed  Moderate Impairment    Gait with Horizontal Head Turns  Moderate Impairment    Gait with Vertical Head Turns  Mild Impairment    Gait and Pivot Turn  Mild Impairment    Step Over Obstacle  Moderate Impairment    Step Around Obstacles  Mild Impairment    Steps  Moderate Impairment    Total Score  12         VESTIBULAR AND BALANCE EVALUATION   HISTORY:  Subjective history of current problem: Patient reports states she has had "woozy" and off balance sensations for about 2-3 years. Patient denies vertigo. Patient describes her dizziness as unsteadiness, lightheadedness and woozy. Patient uses a SPC. Patient reports she is getting dizziness several times a day depending on her activities. The symptom nature of patient's dizziness is motion provoked and intermittent. Patient reports the episodes of dizziness last minutes. Patient reports turning her head and body bring on her dizziness symptoms and lying down in the recliner and shutting her eyes makes the  dizziness better. Patient reports she has saw Dr. Pryor Ochoa and she had a VNG test. 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 Patient reports she had vestibular therapy for a few months and states she felt it did help her some. Patient states states she sees double vision off and on for the past several years. Patient states some mornings she wakes up and sees double vision for a while. Patient states she has seen her eye doctor several times in regards to this issue.  Description of dizziness: unsteadiness, lightheadedness, falling, woozy   Progression of symptoms: no change since onset but states some days it has been worse recently History of similar episodes:  No except a few years ago she had a bout of vertigo states she was so sick she could not get out of bed and that this episode lasted one day.   Falls (yes/no): yes Number of falls in past 6 months: 1 states she was not dizzy at the time of the fall; patient states she was turning to step over something while using her walker and she fell.  Auditory complaints (tinnitus, pain, drainage): patient denies Vision (last eye exam, diplopia, recent changes): wears eye glasses; she had an eye appointment six months ago and she goes every 6 months due to macular degeneration and she has an appointment this month.   Current Symptoms: (dysarthria, dysphagia, drop attacks, bowel and bladder changes, recent weight loss/gain)  Review of systems negative for red flags.     EXAMINATION   SOMATOSENSORY:  Any N & T in extremities or weakness: reports she has difficulty with moving her fingers and reports her 5th right finger stays numb.   MUSCULOSKELETAL SCREEN: Cervical Spine ROM:  4/5 bilateral hip flexors, hip abd/add, left quads and bilateral hamstrings 5/5 right quad and DF  Functional Mobility: Sit to stand from Staxi chair with min A and from  armchair with CGA. Patient observed to require multiple attempts to perform sit to stand at times during session.  Gait: Patient arrives to clinic in Staxi chair. Patient ambulates with SPC with markedly decreased gait speed with decreased arm swing and decreased step length and height.  Scanning of visual environment with gait is: poor  Balance: Patient is challenged by single leg stance, narrow base of support, ambulation with  head and body turns.   POSTURAL CONTROL TESTS:  Clinical Test of Sensory Interaction for Balance (CTSIB): deferred this date  OCULOMOTOR / VESTIBULAR TESTING: Oculomotor Exam- Room Light  Normal Abnormal Comments  Ocular Alignment N    Ocular ROM N    Spontaneous Nystagmus N    Gaze evoked Nystagmus N    Smooth Pursuit N    Saccades  Abn Hypometric saccades in all fields   VOR  Abn   VOR Cancellation  Abn   Left Head Impulse   deferred  Right Head Impulse   deferred  Head Shaking Nystagmus   deferred    BPPV TESTS: deferred this date; patient had VNG test that was negative for BPPV. To be assessed.  Symptoms Duration Intensity Nystagmus  Left Dix-Hallpike      Right Dix-Hallpike        FUNCTIONAL OUTCOME MEASURES:  Results Comments  DHI    36/100 Low perception of handicap  ABC Scale     43.8% High Falls risk; in need of intervention  DGI       12/24  Fall risk; in need of intervention  10 Meter Walking Speed 0.41 M/sec Below verage as compared to age and gender normative values; in need of intervention  FOTO  53/100 (out of 100) Given the patient's risk adjustment variables, like-patients nationally had a FS score of 64 at intake    VOR X 1 exercise:  Demonstrated and educated as to VOR X1.  Patient performed VOR X 1 horizontal in sitting 3 reps of 1 minute each with verbal cues for technique.  Patient reports the target is Patient reports 3-4/10 dizziness after 1 minute rep and states it got a little higher than a 4/10 after second 1 minute rep  and patient reports mild nausea.    PT Education - 03/06/19 1234    Education Details  discussed plan of care and goals; issued VOR X1 in sitting for HEP    Person(s) Educated  Patient    Methods  Explanation;Demonstration;Verbal cues;Handout    Comprehension  Verbalized understanding;Returned demonstration       PT Short Term Goals - 03/06/19 1318      PT SHORT TERM GOAL #1   Title  Pt will be independent with HEP in order to improve balance and decrease dizziness symptoms in order to decrease fall risk and improve function at home.    Time  4    Period  Weeks    Status  New    Target Date  04/02/19        PT Long Term Goals - 03/06/19 1318      PT LONG TERM GOAL #1   Title  Patient will reduce falls risk as indicated by Activities Specific Balance Confidence Scale (ABC) >67%.    Baseline  scored 43.8% on 03/05/19    Time  8    Period  Weeks    Status  New    Target Date  04/30/19      PT LONG TERM GOAL #2   Title  Patient will score 63/100 or greater on the FOTO survey to indicate improved function and activity level    Baseline  scored 53/100 on 03/05/19    Time  8    Period  Weeks    Status  New    Target Date  04/30/19      PT LONG TERM GOAL #3   Title  Patient will report 50% or greater improvement  in her symptoms of dizziness and imbalance with provoking motions or positions    Time  8    Period  Weeks    Status  New    Target Date  04/30/19             Plan - 03/06/19 1412    Clinical Impression Statement  Patient reports dizziness and imbalance that started 2-3 years ago that has worsened recently. Patient with abnormal saccades, VOR and VOR cancellation this date. Patient is at high risk of falls as inidicated by ABC scale of 43.8% and DGI 12/24. Patient would benefit from PT services to address functional deficits and goals as set on plan of care and to try to reduce patient's falls risk and subjective symptoms of dizziness and imbalance.    Personal  Factors and Comorbidities  Comorbidity 3+;Past/Current Experience;Time since onset of injury/illness/exacerbation    Comorbidities  Atrial fibrillation, macular degeneration, double vision, HTN    Examination-Activity Limitations  Other   activities with head and body turns   Stability/Clinical Decision Making  Evolving/Moderate complexity    Clinical Decision Making  Moderate    Rehab Potential  Good    PT Frequency  1x / week    PT Duration  8 weeks    PT Treatment/Interventions  [place] Repostioning;Gait training;Stair training;Therapeutic activities;Therapeutic exercise;Balance training;Neuromuscular re-education;Patient/family education;Vestibular    PT Next Visit Plan  review VOR X1; try sit to stand, firm and uneven surfaces narrow BOS and St progressions with head turns    PT Home Exercise Plan  VOR X1    Consulted and Agree with Plan of Care  Patient       Patient will benefit from skilled therapeutic intervention in order to improve the following deficits and impairments:  Decreased activity tolerance, Decreased balance, Dizziness, Decreased mobility, Decreased strength, Difficulty walking, Decreased endurance  Visit Diagnosis: Dizziness and giddiness  Difficulty in walking, not elsewhere classified  Unsteadiness on feet     Problem List Patient Active Problem List   Diagnosis Date Noted  . Near syncope 02/20/2019  . Labile hypertension 12/12/2017  . Paroxysmal atrial fibrillation ([place]) 12/12/2017  . Aortic valve disease 12/12/2017   Lady Deutscher PT, DPT [phone] Lady Deutscher 03/06/2019, 2:47 PM  [place] MAIN [facility] SERVICES [address] [place], [place], [address] Phone: [phone]   Fax:  [phone]  Name: CARRIN BARTZ MRN: [id number] Date of Birth: 1925-12-31

## 2019-03-12 ENCOUNTER — Ambulatory Visit: Payer: PPO | Admitting: Physical Therapy

## 2019-03-12 ENCOUNTER — Encounter: Payer: Self-pay | Admitting: Physical Therapy

## 2019-03-12 ENCOUNTER — Other Ambulatory Visit: Payer: Self-pay

## 2019-03-12 DIAGNOSIS — R2681 Unsteadiness on feet: Secondary | ICD-10-CM

## 2019-03-12 DIAGNOSIS — R262 Difficulty in walking, not elsewhere classified: Secondary | ICD-10-CM

## 2019-03-12 DIAGNOSIS — R42 Dizziness and giddiness: Secondary | ICD-10-CM | POA: Diagnosis not present

## 2019-03-12 NOTE — Therapy (Signed)
[place] MAIN [facility] SERVICES [address] [place], [place], [address] Phone: [phone]   Fax:  [phone]  Physical Therapy Treatment  Patient Details  Name: Jessica Kaufman MRN: [id number] Date of Birth: [birth year] Referring Provider (PT): Dr. Carmin Richmond   Encounter Date: 03/12/2019  PT End of Session - 03/12/19 1104    Visit Number  2    Number of Visits  9    Date for PT Re-Evaluation  04/30/19    PT Start Time  1103    PT Stop Time  1149    PT Time Calculation (min)  46 min    Equipment Utilized During Treatment  Gait belt    Activity Tolerance  Patient tolerated treatment well    Behavior During Therapy  [facility] for tasks assessed/performed       Past Medical History:  Diagnosis Date  . Aortic valve disease   . Asthma   . Cancer ([place])    basal cell   . Gout   . Hyperlipidemia   . Hypertension   . Hypertensive heart disease without heart failure   . Hypothyroidism   . PAF (paroxysmal atrial fibrillation) ([place])   . PVC (premature ventricular contraction)   . Rheumatoid arthritis ([place])   . Ventricular premature depolarization     Past Surgical History:  Procedure Laterality Date  . BLADDER SURGERY  10/19/2010  . BONY PELVIS SURGERY  1990  . BREAST BIOPSY Left 1986  . CARDIAC SURGERY  2009   PVC  . CATARACT EXTRACTION Left 11/15/00  . CATARACT EXTRACTION Right   . ELBOW SURGERY  06/1990  . HIP SURGERY Left 05/28/09  . HIP SURGERY Right 06/30/03  . HYSTEROSCOPY  06/17/1986   vaginal    There were no vitals filed for this visit.  Subjective Assessment - 03/18/19 1349    Subjective  Patient statesPatient reports last Friday she was standing at the kitchen counter and she felt something/fluttering with her heart. She pressed her Halter monitor and then she fell backwards and took a step trying to catch herself, but was unable to regain her balance and fell.  Patient states she did not get hurt except she is sore in her  elbow and right side of her trunk. Patient states she has been more whoozy the last two days. Patient states she did her exercise every day. Patient reports the first day she felt a little nausea.    Pertinent History  Patient reports states she has had "woozy" and off balance sensations for about 2-3 years. Patient denies vertigo. Patient describes her dizziness as unsteadiness, lightheadedness and woozy. Patient uses a SPC. Patient reports she is getting dizziness several times a day depending on her activities. The symptom nature of patient's dizziness is motion provoked and intermittent. Patient reports the episodes of dizziness last minutes. Patient reports turning her head and body bring on her dizziness symptoms and lying down in the recliner and shutting her eyes makes the dizziness better. Patient reports she has saw Dr. Pryor Ochoa and she had a VNG test. 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 Patient reports she had vestibular therapy for a few months and states she felt it did help her some. Patient states states she sees double vision off and on for the past several years. Patient states some mornings she wakes up and sees double vision for a while. Patient states she has seen  her eye doctor several times in regards to this issue.    Diagnostic tests  VNG test: 44% right UVH and abnormal smooth pursuits and saccades per MR    Patient Stated Goals  to have improved balance      Patient reports last Friday she was standing at the kitchen counter and she felt something/fluttering with her heart. She pressed her Halter monitor and then she fell backwards and took a step trying to catch herself, but was unable to regain her balance and fell.  Patient states she did not get hurt except she is sore in her elbow and right side of her trunk. Patient states she has been more whoozy the last two days. Patient states  she did her exercise every day. Patient reports the first day she felt a little nausea. Patient reports her neck is sore today. Patient rates 5/10 with active movement.   Neuromuscular Re-education:  Mini-squats: Patient performed 10 reps mini-squats with 5 second holds with verbal cues for technique. Patient had 2 episodes of right knee buckling slightly.   Ambulation with head turns: Amb in // bars multiple reps of 6' each of ambulating with horizontal and vertical head turns. Patient was able to ambulate with hands hovering over bar to hold only if needed. Patient reports mild increase in her dizziness symptoms with this activity.   VOR X 1 exercise:  Patient performed VOR X 1 horizontal in standing 3 reps of 1 minute each with verbal cues for chin tuck technique.   Ball follows: Patient performed seated ball follows horizontal, vertical and then increasing clockwise and decreasing counterclockwise turns while following ball with head and eyes.  Patient reports 5/10 dizziness with ball follows. Added to HEP.    Sit to Stands:  Patient educated as to proper form with sit to stand. Patient performed 10 sit to stands. Patient did not use UEs and demonstrated fair eccentric control with stand to sitting. Patient reports this activity is tiring in the legs.  Education:  Issued sit to stand and seated ball circles for HEP. Handout provided.  MedBridge  Access Code: [email or web address]  URL: [email or web address]  Date: 03/12/2019  Prepared by: Lady Deutscher   Exercises Sit to Stand - 10 reps - 1 sets - 1x daily - 7x weekly Seated Vestibular ball circles - 5 reps - 1 sets - 1x daily - 7x weekly    PT Education - 03/12/19 1104    Education Details  reviewed VOR X 1; issued sit to stand and seated ball follows on MedBridge; discussed safety with HEP   Person(s) Educated  Patient    Methods  Explanation; demonstrated; handout provided   Comprehension  Verbalized understanding,   returned demonstration      PT Short Term Goals - 03/06/19 1318      PT SHORT TERM GOAL #1   Title  Pt will be independent with HEP in order to improve balance and decrease dizziness symptoms in order to decrease fall risk and improve function at home.    Time  4    Period  Weeks    Status  New    Target Date  04/02/19        PT Long Term Goals - 03/06/19 1318      PT LONG TERM GOAL #1   Title  Patient will reduce falls risk as indicated by Activities Specific Balance Confidence Scale (ABC) >67%.    Baseline  scored 43.8% on 03/05/19    Time  8    Period  Weeks    Status  New    Target Date  04/30/19      PT LONG TERM GOAL #2   Title  Patient will score 63/100 or greater on the FOTO survey to indicate improved function and activity level    Baseline  scored 53/100 on 03/05/19    Time  8    Period  Weeks    Status  New    Target Date  04/30/19      PT LONG TERM GOAL #3   Title  Patient will report 50% or greater improvement in her symptoms of dizziness and imbalance with provoking motions or positions    Time  8    Period  Weeks    Status  New    Target Date  04/30/19            Plan - 03/18/19 1404    Clinical Impression Statement  Patient reports that she had one fall since last visit. Patient states she pushed her Halter monitor button because she felt her heart fluttering and then she fell backwards. Patient states she did not strike her head or lose consciousness but event was unwitnessed. Patient reports that she has been doing her VOR X 1 exericse and that she was getting some mild nausea intially. Patient issued sit to stands, seated VOR X 1 and seated ball follow exercises for HEP. Patient reports mild dizziness with ball follows and with ambulation with head turns. Patient would benefit from continued PT services to try to reduce falls risk and address goals as set on plan of care.    Personal Factors and Comorbidities  Comorbidity 3+;Past/Current  Experience;Time since onset of injury/illness/exacerbation    Comorbidities  Atrial fibrillation, macular degeneration, double vision, HTN    Examination-Activity Limitations  Other   activities with head and body turns   Stability/Clinical Decision Making  Evolving/Moderate complexity    Rehab Potential  Good    PT Frequency  1x / week    PT Duration  8 weeks    PT Treatment/Interventions  Canalith Repostioning;Gait training;Stair training;Therapeutic activities;Therapeutic exercise;Balance training;Neuromuscular re-education;Patient/family education;Vestibular    PT Next Visit Plan  review VOR X1; try sit to stand, firm and uneven surfaces narrow BOS and St progressions with head turns    PT Home Exercise Plan  Access Code: [email or web address]    Consulted and Agree with Plan of Care  Patient       Patient will benefit from skilled therapeutic intervention in order to improve the following deficits and impairments:  Decreased activity tolerance, Decreased balance, Dizziness, Decreased mobility, Decreased strength, Difficulty walking, Decreased endurance  Visit Diagnosis: Dizziness and giddiness  Difficulty in walking, not elsewhere classified  Unsteadiness on feet     Problem List Patient Active Problem List   Diagnosis Date Noted  . Near syncope 02/20/2019  . Labile hypertension 12/12/2017  . Paroxysmal atrial fibrillation ([place]) 12/12/2017  . Aortic valve disease 12/12/2017   Lady Deutscher PT, DPT [phone] Lady Deutscher 03/12/2019, 12:00 PM  [place] MAIN [facility] SERVICES [address] [place], [place], [address] Phone: [phone]   Fax:  [phone]  Name: DEVORY DORNBUSCH MRN: [id number] Date of Birth: 1925/10/11

## 2019-03-16 DIAGNOSIS — H524 Presbyopia: Secondary | ICD-10-CM | POA: Diagnosis not present

## 2019-03-16 DIAGNOSIS — H353230 Exudative age-related macular degeneration, bilateral, stage unspecified: Secondary | ICD-10-CM | POA: Diagnosis not present

## 2019-03-16 DIAGNOSIS — H5203 Hypermetropia, bilateral: Secondary | ICD-10-CM | POA: Diagnosis not present

## 2019-03-16 DIAGNOSIS — H52223 Regular astigmatism, bilateral: Secondary | ICD-10-CM | POA: Diagnosis not present

## 2019-03-19 ENCOUNTER — Other Ambulatory Visit: Payer: Self-pay

## 2019-03-19 ENCOUNTER — Encounter: Payer: Self-pay | Admitting: Physical Therapy

## 2019-03-19 ENCOUNTER — Ambulatory Visit: Payer: PPO | Admitting: Physical Therapy

## 2019-03-19 ENCOUNTER — Encounter: Payer: PPO | Admitting: Physical Therapy

## 2019-03-19 DIAGNOSIS — R42 Dizziness and giddiness: Secondary | ICD-10-CM

## 2019-03-19 DIAGNOSIS — R262 Difficulty in walking, not elsewhere classified: Secondary | ICD-10-CM

## 2019-03-19 DIAGNOSIS — R2681 Unsteadiness on feet: Secondary | ICD-10-CM

## 2019-03-19 NOTE — Therapy (Signed)
[place] MAIN [facility] SERVICES [address] [place], [place], [address] Phone: [phone]   Fax:  [phone]  Physical Therapy Treatment  Patient Details  Name: Jessica Kaufman MRN: [id number] Date of Birth: 1925-10-01 Referring Provider (PT): Dr. Carmin Richmond   Encounter Date: 03/19/2019  PT End of Session - 03/19/19 1514    Visit Number  3    Number of Visits  9    Date for PT Re-Evaluation  04/30/19    PT Start Time  [id number]    PT Stop Time  1559    PT Time Calculation (min)  45 min    Equipment Utilized During Treatment  Gait belt    Activity Tolerance  Patient tolerated treatment well    Behavior During Therapy  [facility] for tasks assessed/performed       Past Medical History:  Diagnosis Date  . Aortic valve disease   . Asthma   . Cancer ([place])    basal cell   . Gout   . Hyperlipidemia   . Hypertension   . Hypertensive heart disease without heart failure   . Hypothyroidism   . PAF (paroxysmal atrial fibrillation) ([place])   . PVC (premature ventricular contraction)   . Rheumatoid arthritis ([place])   . Ventricular premature depolarization     Past Surgical History:  Procedure Laterality Date  . BLADDER SURGERY  10/19/2010  . BONY PELVIS SURGERY  1990  . BREAST BIOPSY Left 1986  . CARDIAC SURGERY  2009   PVC  . CATARACT EXTRACTION Left 11/15/00  . CATARACT EXTRACTION Right   . ELBOW SURGERY  06/1990  . HIP SURGERY Left 05/28/09  . HIP SURGERY Right 06/30/03  . HYSTEROSCOPY  06/17/1986   vaginal    There were no vitals filed for this visit.  Subjective Assessment - 03/19/19 1517    Subjective  Patient states she has felt whoozy headed the last few days. Patient reports she has been trying to do some of the exercises at home. Patient reports her knee has caught a few times this morning.    Pertinent History  Patient reports states she has had "woozy" and off balance sensations for about 2-3 years. Patient denies vertigo. Patient  describes her dizziness as unsteadiness, lightheadedness and woozy. Patient uses a SPC. Patient reports she is getting dizziness several times a day depending on her activities. The symptom nature of patient's dizziness is motion provoked and intermittent. Patient reports the episodes of dizziness last minutes. Patient reports turning her head and body bring on her dizziness symptoms and lying down in the recliner and shutting her eyes makes the dizziness better. Patient reports she has saw Dr. Pryor Ochoa and she had a VNG test. 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 Patient reports she had vestibular therapy for a few months and states she felt it did help her some. Patient states states she sees double vision off and on for the past several years. Patient states some mornings she wakes up and sees double vision for a while. Patient states she has seen her eye doctor several times in regards to this issue.    Diagnostic tests  VNG test: 44% right UVH and abnormal smooth pursuits and saccades per MR    Patient Stated Goals  to have improved balance         Patient reports the ball follows exercise recreates her dizziness at home. Patient  states she did her exercises everyday except for Saturday because she had her eyes examined and eyes dilated and it took a long time for her eyes to return to normal.  Neuromuscular Re-education:  VOR X 1 exercise:  Patient performed VOR X 1 horizontal in standing with conflicting background holding chair back for support 3 reps of 1 minute each with good amount of head turn but slower speed.  Patient reports no increase in her dizziness with this activity and rates as 6-7/10.    Reviewed HEP: Sit to Stands:  Patient performed 10 reps sit to stand without use of UEs demonstrating fair eccentric descent control.   Seated ball follows:  Performed clockwise gradually  increasing size of circles and counterclockwise gradually decreasing size of circles. Patient followed object with her eyes but needed cuing to turn her head. Repeated doing horizontal and vertical ball follows and patient demonstrated increased head turning with these motions so added this to HEP in sitting.  Airex pad:  On firm surface and then on Airex pad, patient performed feet together progressions (progressed to feet together on firm but WBOS on foam) and semi-tandem progressions with alternating lead leg with and without horizontal and vertical head turns with CGA. Patient progressed from one hand support to no UEs support with these activities.   Eyes closed: On Firm surface with feet together, worked on [facility] static holds for 15-30 seconds with CGA. Patient demonstrated increased sway.  Ambulation with head turns:  Patient performed forwards and retro ambulation in parallel bar with one hand holding bar for support with horizontal and vertical head turns with CGA.  Patient reports increased dizziness with retro ambulation.     PT Education - 03/19/19 1513    Education Details  reviewed HEP- added conflicting background in sitting to VOR X1 and added horizontal and vertical ball follows in sitting    Person(s) Educated  Patient    Methods  Explanation;Demonstration;Verbal cues    Comprehension  Verbalized understanding;Returned demonstration       PT Short Term Goals - 03/06/19 1318      PT SHORT TERM GOAL #1   Title  Pt will be independent with HEP in order to improve balance and decrease dizziness symptoms in order to decrease fall risk and improve function at home.    Time  4    Period  Weeks    Status  New    Target Date  04/02/19        PT Long Term Goals - 03/06/19 1318      PT LONG TERM GOAL #1   Title  Patient will reduce falls risk as indicated by Activities Specific Balance Confidence Scale (ABC) >67%.    Baseline  scored 43.8% on 03/05/19    Time  8    Period   Weeks    Status  New    Target Date  04/30/19      PT LONG TERM GOAL #2   Title  Patient will score 63/100 or greater on the FOTO survey to indicate improved function and activity level    Baseline  scored 53/100 on 03/05/19    Time  8    Period  Weeks    Status  New    Target Date  04/30/19      PT LONG TERM GOAL #3   Title  Patient will report 50% or greater improvement in her symptoms of dizziness and imbalance with provoking motions or positions  Time  8    Period  Weeks    Status  New    Target Date  04/30/19            Plan - 03/19/19 1640    Clinical Impression Statement  Patient reports 6-7/10 dizziness on arrival to clinic and reports that she has been compliant with HEP. Patient challenged by feet together and semi-tandem progressions on firm and foam surfaces as well as by retro ambulation with head turns in parallel bars. Patient demonstrated good technique with sit to stand exercises and was able to perform 10 reps with decreased fatigue this date. Patient able to work on progression of conflicting background with VOR X 1 exercise and added this progression to HEP. Patient would benefit from continued PT services to try to reduce patient's falls risk, to improve patient's balance and to decrease subjective symptoms of dizziness.    Personal Factors and Comorbidities  Comorbidity 3+;Past/Current Experience;Time since onset of injury/illness/exacerbation    Comorbidities  Atrial fibrillation, macular degeneration, double vision, HTN    Examination-Activity Limitations  Other   activities with head and body turns   Stability/Clinical Decision Making  Evolving/Moderate complexity    Rehab Potential  Good    PT Frequency  1x / week    PT Duration  8 weeks    PT Treatment/Interventions  Canalith Repostioning;Gait training;Stair training;Therapeutic activities;Therapeutic exercise;Balance training;Neuromuscular re-education;Patient/family education;Vestibular    PT Next  Visit Plan  review VOR X1; try sit to stand, firm and uneven surfaces narrow BOS and St progressions with head turns    PT Home Exercise Plan  Access Code: [email or web address]    Consulted and Agree with Plan of Care  Patient       Patient will benefit from skilled therapeutic intervention in order to improve the following deficits and impairments:  Decreased activity tolerance, Decreased balance, Dizziness, Decreased mobility, Decreased strength, Difficulty walking, Decreased endurance  Visit Diagnosis: Dizziness and giddiness  Difficulty in walking, not elsewhere classified  Unsteadiness on feet     Problem List Patient Active Problem List   Diagnosis Date Noted  . Near syncope 02/20/2019  . Labile hypertension 12/12/2017  . Paroxysmal atrial fibrillation ([place]) 12/12/2017  . Aortic valve disease 12/12/2017   Lady Deutscher PT, DPT [phone] Lady Deutscher 03/19/2019, 4:43 PM  [place] MAIN [facility] SERVICES [address] [place], [place], [address] Phone: [phone]   Fax:  [phone]  Name: Jessica Kaufman MRN: [id number] Date of Birth: 06/18/1925

## 2019-03-21 DIAGNOSIS — Z87898 Personal history of other specified conditions: Secondary | ICD-10-CM | POA: Diagnosis not present

## 2019-03-21 DIAGNOSIS — L97921 Non-pressure chronic ulcer of unspecified part of left lower leg limited to breakdown of skin: Secondary | ICD-10-CM | POA: Diagnosis not present

## 2019-03-22 ENCOUNTER — Other Ambulatory Visit: Payer: Self-pay

## 2019-03-26 ENCOUNTER — Ambulatory Visit: Payer: PPO

## 2019-03-26 ENCOUNTER — Other Ambulatory Visit: Payer: Self-pay

## 2019-03-26 VITALS — BP 160/68 | HR 60

## 2019-03-26 DIAGNOSIS — R42 Dizziness and giddiness: Secondary | ICD-10-CM

## 2019-03-26 DIAGNOSIS — R2681 Unsteadiness on feet: Secondary | ICD-10-CM

## 2019-03-26 DIAGNOSIS — R262 Difficulty in walking, not elsewhere classified: Secondary | ICD-10-CM

## 2019-03-26 NOTE — Therapy (Signed)
[place] MAIN [facility] SERVICES [address] [place], [place], [address] Phone: [phone]   Fax:  [phone]  Physical Therapy Treatment  Patient Details  Name: Jessica Kaufman MRN: [id number] Date of Birth: March 30, 1925 Referring Provider (PT): Dr. Carmin Richmond   Encounter Date: 03/26/2019  PT End of Session - 03/26/19 1103    Visit Number  4    Number of Visits  9    Date for PT Re-Evaluation  04/30/19    PT Start Time  1115    PT Stop Time  1200    PT Time Calculation (min)  45 min    Equipment Utilized During Treatment  Gait belt    Activity Tolerance  Patient tolerated treatment well    Behavior During Therapy  [facility] for tasks assessed/performed       Past Medical History:  Diagnosis Date  . Aortic valve disease   . Asthma   . Cancer ([place])    basal cell   . Gout   . Hyperlipidemia   . Hypertension   . Hypertensive heart disease without heart failure   . Hypothyroidism   . PAF (paroxysmal atrial fibrillation) ([place])   . PVC (premature ventricular contraction)   . Rheumatoid arthritis ([place])   . Ventricular premature depolarization     Past Surgical History:  Procedure Laterality Date  . BLADDER SURGERY  10/19/2010  . BONY PELVIS SURGERY  1990  . BREAST BIOPSY Left 1986  . CARDIAC SURGERY  2009   PVC  . CATARACT EXTRACTION Left 11/15/00  . CATARACT EXTRACTION Right   . ELBOW SURGERY  06/1990  . HIP SURGERY Left 05/28/09  . HIP SURGERY Right 06/30/03  . HYSTEROSCOPY  06/17/1986   vaginal    Vitals:   03/26/19 1119  BP: (!) 160/68  Pulse: 60  SpO2: 98%    Subjective Assessment - 03/26/19 1102    Subjective  Patient states she has had some days where her dizziness is worse and other days where she "feels OK." She has "a little bit" of dizziness upon arrival today. Denies any pain currently. No falls or stumbles since last therapy session and no general health changes. No specific questions or concerns.    Pertinent  History  Patient reports states she has had "woozy" and off balance sensations for about 2-3 years. Patient denies vertigo. Patient describes her dizziness as unsteadiness, lightheadedness and woozy. Patient uses a SPC. Patient reports she is getting dizziness several times a day depending on her activities. The symptom nature of patient's dizziness is motion provoked and intermittent. Patient reports the episodes of dizziness last minutes. Patient reports turning her head and body bring on her dizziness symptoms and lying down in the recliner and shutting her eyes makes the dizziness better. Patient reports she has saw Dr. Pryor Ochoa and she had a VNG test. 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 Patient reports she had vestibular therapy for a few months and states she felt it did help her some. Patient states states she sees double vision off and on for the past several years. Patient states some mornings she wakes up and sees double vision for a while. Patient states she has seen her eye doctor several times in regards to this issue.    Diagnostic tests  VNG test: 44% right UVH and abnormal smooth pursuits and saccades per MR    Patient Stated Goals  to have improved balance    Currently in Pain?  No/denies         TREATMENT   Neuromuscular Re-education: NuStep L1/2 x 5 minutes for warm-up during history (4 minutes unbilled);  VOR X 1 exercise:  Patient performed VOR X 1 horizontal in standing WBOS with conflicting background holding chair back for support 3 reps of 1 minute each with good amount of head turn; Patient reports "slight" increase in her dizziness with this activity to 5-6/10.   Seated ball follows:  Performed clockwise/counterclockwise gradually increasing size of circles. Patient followed object with her eyes but needed cuing to turn her head. Repeated doing horizontal and vertical  ball follows and patient demonstrated increased head turning with these motions however she still requires minor cueing to turn head fully. She reports 7-8/10 dizziness with this exercise;  Sit to Stands:  Patient performed 10 reps sit to stand without use of UEs in // bars. Gradual slowing as reps progress. Pt visibly fatigued at end;  Airex pad:  On firm surface and then on Airex pad, patient performed feet together static balance eyes open/closed x 30s each in both conditions; Horizontal and vertical head turns x 30s each in both conditions; Diona Foley passes with therapist providing return catch over opposite shoulder varying height between waist and overhead with head/eye follow, therapist adjusts distance to challenge lateral, vertical, and forward reaching; 6" alternating step taps without UE support x 5 each, very challenging for patient with intermittent LOB;    Pt educated throughout session about proper posture and technique with exercises. Improved exercise technique, movement at target joints, use of target muscles after min to mod verbal, visual, tactile cues.    Patient demonstrates excellent motivation during session today. She reports "a little bit" of dizziness upon arrival today but does not rate. She reports a "slight" increase in her dizziness with VOR x 1 hoirzontal to "5-6/10."  Pt reports compliance with HEP. Patient challenged by feet together eyes closed balance on both firm and Airex surfaces. Encouraged pt to continue with her current HEP and no modifications made on this date  Patient would benefit from continued PT services to try to reduce patient's falls risk, to improve patient's balance and to decrease subjective symptoms of dizziness.                  PT Short Term Goals - 03/06/19 1318      PT SHORT TERM GOAL #1   Title  Pt will be independent with HEP in order to improve balance and decrease dizziness symptoms in order to decrease fall risk and  improve function at home.    Time  4    Period  Weeks    Status  New    Target Date  04/02/19        PT Long Term Goals - 03/06/19 1318      PT LONG TERM GOAL #1   Title  Patient will reduce falls risk as indicated by Activities Specific Balance Confidence Scale (ABC) >67%.    Baseline  scored 43.8% on 03/05/19    Time  8    Period  Weeks    Status  New    Target Date  04/30/19      PT LONG TERM GOAL #2   Title  Patient will score 63/100 or greater on the FOTO survey to indicate improved function and activity level    Baseline  scored 53/100 on 03/05/19    Time  8    Period  Weeks    Status  New    Target Date  04/30/19      PT LONG TERM GOAL #3   Title  Patient will report 50% or greater improvement in her symptoms of dizziness and imbalance with provoking motions or positions    Time  8    Period  Weeks    Status  New    Target Date  04/30/19            Plan - 03/26/19 1103    Clinical Impression Statement  Patient demonstrates excellent motivation during session today. She reports "a little bit" of dizziness upon arrival today but does not rate. She reports a "slight" increase in her dizziness with VOR x 1 hoirzontal to "5-6/10."  Pt reports compliance with HEP. Patient challenged by feet together eyes closed balance on both firm and Airex surfaces. Encouraged pt to continue with her current HEP and no modifications made on this date  Patient would benefit from continued PT services to try to reduce patient's falls risk, to improve patient's balance and to decrease subjective symptoms of dizziness.    Personal Factors and Comorbidities  Comorbidity 3+;Past/Current Experience;Time since onset of injury/illness/exacerbation    Comorbidities  Atrial fibrillation, macular degeneration, double vision, HTN    Examination-Activity Limitations  Other   activities with head and body turns   Stability/Clinical Decision Making  Evolving/Moderate complexity    Rehab Potential  Good     PT Frequency  1x / week    PT Duration  8 weeks    PT Treatment/Interventions  Canalith Repostioning;Gait training;Stair training;Therapeutic activities;Therapeutic exercise;Balance training;Neuromuscular re-education;Patient/family education;Vestibular    PT Next Visit Plan  review VOR X1; try sit to stand, firm and uneven surfaces narrow BOS and St progressions with head turns    PT Home Exercise Plan  Access Code: [email or web address]    Consulted and Agree with Plan of Care  Patient       Patient will benefit from skilled therapeutic intervention in order to improve the following deficits and impairments:  Decreased activity tolerance, Decreased balance, Dizziness, Decreased mobility, Decreased strength, Difficulty walking, Decreased endurance  Visit Diagnosis: Dizziness and giddiness  Difficulty in walking, not elsewhere classified  Unsteadiness on feet     Problem List Patient Active Problem List   Diagnosis Date Noted  . Near syncope 02/20/2019  . Labile hypertension 12/12/2017  . Paroxysmal atrial fibrillation ([place]) 12/12/2017  . Aortic valve disease 12/12/2017   Phillips Grout PT, DPT, GCS  Lelon Ikard 03/26/2019, 12:09 PM  [place] MAIN [facility] SERVICES [address] [place], [place], [address] Phone: [phone]   Fax:  [phone]  Name: Jessica Kaufman MRN: [id number] Date of Birth: 17-Nov-1925

## 2019-04-02 ENCOUNTER — Encounter: Payer: Self-pay | Admitting: Physical Therapy

## 2019-04-02 ENCOUNTER — Ambulatory Visit: Payer: PPO | Admitting: Physical Therapy

## 2019-04-02 ENCOUNTER — Other Ambulatory Visit: Payer: Self-pay

## 2019-04-02 DIAGNOSIS — R262 Difficulty in walking, not elsewhere classified: Secondary | ICD-10-CM

## 2019-04-02 DIAGNOSIS — R2681 Unsteadiness on feet: Secondary | ICD-10-CM

## 2019-04-02 DIAGNOSIS — R42 Dizziness and giddiness: Secondary | ICD-10-CM | POA: Diagnosis not present

## 2019-04-02 NOTE — Therapy (Signed)
[place] MAIN [facility] SERVICES [address] [place], [place], [address] Phone: [phone]   Fax:  [phone]  Physical Therapy Treatment  Patient Details  Name: Jessica Kaufman MRN: [id number] Date of Birth: 1925/07/06 Referring Provider (PT): Dr. Carmin Richmond   Encounter Date: 04/02/2019  PT End of Session - 04/02/19 1123    Visit Number  5    Number of Visits  9    Date for PT Re-Evaluation  04/30/19    PT Start Time  1117    PT Stop Time  1200    PT Time Calculation (min)  43 min    Equipment Utilized During Treatment  Gait belt    Activity Tolerance  Patient tolerated treatment well    Behavior During Therapy  [facility] for tasks assessed/performed       Past Medical History:  Diagnosis Date  . Aortic valve disease   . Asthma   . Cancer ([place])    basal cell   . Gout   . Hyperlipidemia   . Hypertension   . Hypertensive heart disease without heart failure   . Hypothyroidism   . PAF (paroxysmal atrial fibrillation) ([place])   . PVC (premature ventricular contraction)   . Rheumatoid arthritis ([place])   . Ventricular premature depolarization     Past Surgical History:  Procedure Laterality Date  . BLADDER SURGERY  10/19/2010  . BONY PELVIS SURGERY  1990  . BREAST BIOPSY Left 1986  . CARDIAC SURGERY  2009   PVC  . CATARACT EXTRACTION Left 11/15/00  . CATARACT EXTRACTION Right   . ELBOW SURGERY  06/1990  . HIP SURGERY Left 05/28/09  . HIP SURGERY Right 06/30/03  . HYSTEROSCOPY  06/17/1986   vaginal    There were no vitals filed for this visit.  Subjective Assessment - 04/02/19 1122    Subjective  Patient states that she had several good days in regards to decreased dizziness but states yesterday she had increased dizziness. Patient reports she has been doing her exercises every day except one.    Pertinent History  Patient reports states she has had "woozy" and off balance sensations for about 2-3 years. Patient denies vertigo.  Patient describes her dizziness as unsteadiness, lightheadedness and woozy. Patient uses a SPC. Patient reports she is getting dizziness several times a day depending on her activities. The symptom nature of patient's dizziness is motion provoked and intermittent. Patient reports the episodes of dizziness last minutes. Patient reports turning her head and body bring on her dizziness symptoms and lying down in the recliner and shutting her eyes makes the dizziness better. Patient reports she has saw Dr. Pryor Ochoa and she had a VNG test. 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 Patient reports she had vestibular therapy for a few months and states she felt it did help her some. Patient states states she sees double vision off and on for the past several years. Patient states some mornings she wakes up and sees double vision for a while. Patient states she has seen her eye doctor several times in regards to this issue.    Diagnostic tests  VNG test: 44% right UVH and abnormal smooth pursuits and saccades per MR    Patient Stated Goals  to have improved balance       Neuromuscular Re-education:  VOR X 1 exercise:  Patient performed VOR X 1 horizontal in standing in //  bars holding bar for support with conflicting background 3 reps of 1 minute each with verbal cues for technique.  Patient denies increase in dizziness with this activity.   Step Taps: On firm surface, patient performed alternating step taps on 6" wooden step 2 sets of 10 reps with rest break in between sets with CGA.  Patient challenged by single leg stance especially with standing on left leg and has difficulty with controlling the movement of placing her foot off the step back onto the ground.   Mini-squats: Patient performed 2 sets of 10 reps mini-squats with 5 second holds with verbal cues to for technique with CGA in // bars without UEs  support.  Step Ups: Patient performed 2 sets of 8 reps sideways step up over and return on purple foam pad with faded UEs support progressing to without upper extremity support, but patient held arms away from her body and at upper waist for balance with CGA.   Patient required sitting rest break between sets with this activity secondary to fatigue.   Airex balance beam: On Airex balance beam, performed sideways stance static holds with horizontal and vertical head turns multiple 60 second reps with CGA.  On Airex balance beam, performed sideways stepping without head turns 5' times 2 reps with one hand holding // bars with CGA.   PT Education - 04/02/19 1123    Education Details  Discussed HEP; added conflicting background progression to VOR X 1.    Person(s) Educated  Patient    Methods  Explanation    Comprehension  Verbalized understanding       PT Short Term Goals - 03/06/19 1318      PT SHORT TERM GOAL #1   Title  Pt will be independent with HEP in order to improve balance and decrease dizziness symptoms in order to decrease fall risk and improve function at home.    Time  4    Period  Weeks    Status  New    Target Date  04/02/19        PT Long Term Goals - 03/06/19 1318      PT LONG TERM GOAL #1   Title  Patient will reduce falls risk as indicated by Activities Specific Balance Confidence Scale (ABC) >67%.    Baseline  scored 43.8% on 03/05/19    Time  8    Period  Weeks    Status  New    Target Date  04/30/19      PT LONG TERM GOAL #2   Title  Patient will score 63/100 or greater on the FOTO survey to indicate improved function and activity level    Baseline  scored 53/100 on 03/05/19    Time  8    Period  Weeks    Status  New    Target Date  04/30/19      PT LONG TERM GOAL #3   Title  Patient will report 50% or greater improvement in her symptoms of dizziness and imbalance with provoking motions or positions    Time  8    Period  Weeks    Status  New     Target Date  04/30/19            Plan - 04/02/19 1444    Clinical Impression Statement  Patient reports good compliance with HEP and demonstrates good motivation during session. Patient challenged  by all standing balance activities this date, but especially challenged by compliant surfaces,  narrow base of support and single leg stance activities. Patient demonstrated good technique with VOR X 1 exercise and was able to progress to conflicting background for HEP. Patient would benefit from continued PT services to further address goals and to try to reduce patient's risk of fall and improve patient's balance.    Personal Factors and Comorbidities  Comorbidity 3+;Past/Current Experience;Time since onset of injury/illness/exacerbation    Comorbidities  Atrial fibrillation, macular degeneration, double vision, HTN    Examination-Activity Limitations  Other   activities with head and body turns   Stability/Clinical Decision Making  Evolving/Moderate complexity    Rehab Potential  Good    PT Frequency  1x / week    PT Duration  8 weeks    PT Treatment/Interventions  Canalith Repostioning;Gait training;Stair training;Therapeutic activities;Therapeutic exercise;Balance training;Neuromuscular re-education;Patient/family education;Vestibular    PT Next Visit Plan  review VOR X1; try sit to stand, firm and uneven surfaces narrow BOS and St progressions with head turns    PT Home Exercise Plan  Access Code: [email or web address]    Consulted and Agree with Plan of Care  Patient       Patient will benefit from skilled therapeutic intervention in order to improve the following deficits and impairments:  Decreased activity tolerance, Decreased balance, Dizziness, Decreased mobility, Decreased strength, Difficulty walking, Decreased endurance  Visit Diagnosis: Dizziness and giddiness  Difficulty in walking, not elsewhere classified  Unsteadiness on feet     Problem List Patient Active Problem List    Diagnosis Date Noted  . Near syncope 02/20/2019  . Labile hypertension 12/12/2017  . Paroxysmal atrial fibrillation ([place]) 12/12/2017  . Aortic valve disease 12/12/2017   Lady Deutscher PT, DPT [phone] Lady Deutscher 04/02/2019, 2:59 PM  [place] MAIN [facility] SERVICES [address] [place], [place], [address] Phone: [phone]   Fax:  [phone]  Name: AHLYSSA SWISHER MRN: [id number] Date of Birth: 1925-09-21

## 2019-04-03 ENCOUNTER — Encounter: Payer: Self-pay | Admitting: Internal Medicine

## 2019-04-03 ENCOUNTER — Other Ambulatory Visit: Payer: Self-pay

## 2019-04-03 ENCOUNTER — Ambulatory Visit (INDEPENDENT_AMBULATORY_CARE_PROVIDER_SITE_OTHER): Payer: PPO | Admitting: Internal Medicine

## 2019-04-03 ENCOUNTER — Ambulatory Visit (INDEPENDENT_AMBULATORY_CARE_PROVIDER_SITE_OTHER): Payer: PPO

## 2019-04-03 VITALS — BP 140/70 | HR 71 | Ht 64.0 in | Wt 203.0 lb

## 2019-04-03 DIAGNOSIS — R55 Syncope and collapse: Secondary | ICD-10-CM | POA: Diagnosis not present

## 2019-04-03 DIAGNOSIS — I480 Paroxysmal atrial fibrillation: Secondary | ICD-10-CM

## 2019-04-03 DIAGNOSIS — R0989 Other specified symptoms and signs involving the circulatory and respiratory systems: Secondary | ICD-10-CM | POA: Diagnosis not present

## 2019-04-03 DIAGNOSIS — I471 Supraventricular tachycardia: Secondary | ICD-10-CM

## 2019-04-03 DIAGNOSIS — R002 Palpitations: Secondary | ICD-10-CM

## 2019-04-03 DIAGNOSIS — R42 Dizziness and giddiness: Secondary | ICD-10-CM | POA: Diagnosis not present

## 2019-04-03 NOTE — Progress Notes (Signed)
Follow-up Outpatient Visit Date: 04/03/2019  Primary Care Provider: Leonides Sake, [place] [address]  Chief Complaint: Follow-up dizziness and hypertension  HPI:  Jessica Kaufman is a 86 y.o. female with history of labile hypertension, paroxysmal atrial fibrillation, aortic valve disease, PVCs, hyperlipidemia, and rheumatoid arthritis, who presents for follow-up of hypertension.  We last spoke in mid February via virtual visit.  At that time, Jessica Kaufman continued to have intermittent dizziness with blood pressures being relatively stable between 140-160/80-95.  She has been participating in vestibular rehab.  We agreed to obtain an echocardiogram and 14-day event monitor.  Event monitor showed rare PACs and PVCs as well as multiple episodes of brief PSVT and transient junctional rhythm associated with PACs and PVCs.  Today, Jessica Kaufman reports that she is feeling a little better.  She has been participating in vestibular rehab and has noticed slight improvement in her dizziness.  She denies chest pain and shortness of breath.  She continues to have occasional brief palpitations.  She fell once while wearing the ZIO patch monitor.  She was trying to activate the button due to palpitations that she felt and may have lost her balance.  She did not pass out or feel frankly lightheaded during the episode.  Blood pressures remain somewhat labile but overall are under better control.  --------------------------------------------------------------------------------------------------  Past Medical History:  Diagnosis Date  . Aortic valve disease   . Asthma   . Cancer ([place])    basal cell   . Gout   . Hyperlipidemia   . Hypertension   . Hypertensive heart disease without heart failure   . Hypothyroidism   . PAF (paroxysmal atrial fibrillation) ([place])   . PVC (premature ventricular contraction)   . Rheumatoid arthritis ([place])   . Ventricular premature depolarization     Past Surgical History:  Procedure Laterality Date  . BLADDER SURGERY  10/19/2010  . BONY PELVIS SURGERY  1990  . BREAST BIOPSY Left 1986  . CARDIAC SURGERY  2009   PVC  . CATARACT EXTRACTION Left 11/15/00  . CATARACT EXTRACTION Right   . ELBOW SURGERY  06/1990  . HIP SURGERY Left 05/28/09  . HIP SURGERY Right 06/30/03  . HYSTEROSCOPY  06/17/1986   vaginal    Recent CV Pertinent Labs: Lab Results  Component Value Date   INR 0.97 05/20/2009   K 3.9 11/14/2018   BUN 14 11/14/2018   BUN 17 01/11/2018   CREATININE 0.55 11/14/2018    Past medical and surgical history were reviewed and updated in EPIC.  Current Meds  Medication Sig  . acetaminophen (TYLENOL ARTHRITIS PAIN) 650 MG CR tablet Take 650 mg by mouth every 8 (eight) hours as needed for pain.  Marland Kitchen allopurinol (ZYLOPRIM) 300 MG tablet Take 1 tablet by mouth every other day.   Marland Kitchen amoxicillin (AMOXIL) 500 MG tablet Take 2,000 mg by mouth 2 (two) times daily.  Marland Kitchen apixaban (ELIQUIS) 5 MG TABS tablet Take 5 mg by mouth 2 (two) times daily.   Marland Kitchen b complex vitamins tablet Take 1 tablet by mouth daily.  . Bilberry, Vaccinium myrtillus, 1000 MG CAPS Take 2 capsules by mouth daily.  Marland Kitchen Bioflavonoid Products (BIOFLEX) TABS Take 2 tablets by mouth daily.  . Calcium Carb-Cholecalciferol (OYSTER SHELL CALCIUM/VITAMIN D PO) daily.   . Cholecalciferol (VITAMIN D3) 1000 UNITS CAPS Take 1 capsule by mouth daily.  . Coenzyme Q10 (COQ10) 100 MG CAPS Take 1 capsule by mouth daily.  Marland Kitchen  CRANBERRY PO Take 1 tablet by mouth daily.  Mariane Baumgarten Calcium (STOOL SOFTENER PO) Take by mouth as needed.  Marland Kitchen estrogens, conjugated, (PREMARIN) 0.625 MG tablet Place 0.625 mg vaginally 2 (two) times a week. Take daily for 21 days then do not take for 7 days.  . fexofenadine (ALLEGRA) 180 MG tablet Take 180 mg by mouth daily.  . folic acid (FOLVITE) 1 MG tablet Take by mouth daily.   Marland Kitchen gabapentin (NEURONTIN) 300 MG capsule Take 300 mg by mouth 2 (two) times daily.     . Ginger, Zingiber officinalis, (GINGER ROOT PO) Take by mouth daily.  . hydrochlorothiazide (HYDRODIURIL) 25 MG tablet Take 1 tablet (25 mg total) by mouth daily.  . lansoprazole (PREVACID) 15 MG capsule Take 15 mg by mouth daily as needed.  Marland Kitchen levothyroxine (SYNTHROID, LEVOTHROID) 50 MCG tablet Take 1 tablet by mouth daily.  Marland Kitchen losartan (COZAAR) 50 MG tablet Take 1 tablet (50 mg total) by mouth 2 (two) times daily.  . Magnesium Oxide (PHILLIPS PO) Take by mouth daily as needed.  . methotrexate (RHEUMATREX) 2.5 MG tablet Take by mouth. Takes 7 tablets weekly.  . metoprolol succinate (TOPROL XL) 25 MG 24 hr tablet Take 1 tablet (25 mg total) by mouth at bedtime.  . Multiple Vitamins-Minerals (CENTRUM SILVER PO) Take 1 tablet by mouth daily.  . Multiple Vitamins-Minerals (PRESERVISION AREDS 2 PO) Take 1 capsule by mouth 2 (two) times daily.   . Omega-3 Fatty Acids (FISH OIL) 1000 MG CAPS Take 2 capsules by mouth daily.  Marland Kitchen oxybutynin (DITROPAN-XL) 10 MG 24 hr tablet Take 10 mg by mouth daily.  . predniSONE (DELTASONE) 5 MG tablet Take 5 mg by mouth as directed.   Marland Kitchen PREMARIN vaginal cream Place 1 Applicatorful vaginally in the morning and at bedtime.   . psyllium (METAMUCIL) 58.6 % powder Take 1 packet by mouth daily.  . TURMERIC PO Take by mouth daily.    Allergies: Metronidazole, Sulindac, and Naproxen  Social History   Tobacco Use  . Smoking status: Never Smoker  . Smokeless tobacco: Never Used  Substance Use Topics  . Alcohol use: No  . Drug use: No    Family History  Problem Relation Age of Onset  . Stroke Mother   . Melanoma Mother   . Heart attack Father   . Uterine cancer Sister   . Melanoma Sister   . Heart failure Sister   . Heart attack Maternal Grandmother   . Breast cancer Daughter     Review of Systems: A 12-system review of systems was performed and was negative except as noted in the  HPI.  --------------------------------------------------------------------------------------------------  Physical Exam: BP 140/70 (BP Location: Left Arm, Patient Position: Sitting, Cuff Size: Normal)   Pulse 71   Ht 5\' 4"  (1.626 m)   Wt 203 lb (92.1 kg)   BMI 34.84 kg/m   General:  NAD.  Accompanied by her daughter. HEENT: No conjunctival pallor or scleral icterus. Facemask in place. Neck: Supple without lymphadenopathy, thyromegaly, JVD, or HJR. Lungs: Normal work of breathing. Clear to auscultation bilaterally without wheezes or crackles. Heart: Regular rate and rhythm with 2/6 systolic murmur.  No rubs or gallops. Abdomen: Soft, NT/ND. Extremities: Trace pretibial edema.  EKG:  Normal sinus rhythm with borderline LVH and inferolateral ST/T changes.  Heart rate has increased since 11/14/2018.  Otherwise, there has been no significant interval change.  Lab Results  Component Value Date   WBC 5.8 11/14/2018   HGB 13.0 11/14/2018  HCT 37.9 11/14/2018   MCV 98.4 11/14/2018   PLT 265 11/14/2018    Lab Results  Component Value Date   NA 135 11/14/2018   K 3.9 11/14/2018   CL 99 11/14/2018   CO2 26 11/14/2018   BUN 14 11/14/2018   CREATININE 0.55 11/14/2018   GLUCOSE 93 11/14/2018   ALT 23 11/14/2018    No results found for: CHOL, HDL, LDLCALC, LDLDIRECT, TRIG, CHOLHDL  --------------------------------------------------------------------------------------------------  ASSESSMENT AND PLAN: Hypertension: Blood pressure remains labile but overall is under better control compared with a year ago.  We will continue Jessica Kaufman' current regimen.  Dizziness, palpitations, and PSVT: It is unclear to me if palpitations and brief episodes of junction rhythm and PSVT noted on recent monitor are related to episodic dizziness.  There were no sustained arrhythmias or prolonged pauses noted that would warrant pacemaker placement.  I am hopeful that Jessica Kaufman will continue to  notice improvement with vestibular rehab.  However, if symptoms do not continue to improve, we may need to consider EP evaluation in the future.  I am reluctant to escalate metoprolol, given average heart rate in the low 60's and periods of junction rhythm.  Paroxysmal atrial fibrillation: No atrial fibrillation was seen on recent monitor.  Given that falls have been infrequent, we will continue on anticoagulation.  Follow-up: Return to clinic in 6 months.  Nelva Bush, MD 04/03/2019 3:16 PM

## 2019-04-03 NOTE — Patient Instructions (Signed)
Medication Instructions:  Your physician recommends that you continue on your current medications as directed. Please refer to the Current Medication list given to you today.  *If you need a refill on your cardiac medications before your next appointment, please call your pharmacy*   Lab Work: none If you have labs (blood work) drawn today and your tests are completely normal, you will receive your results only by: Marland Kitchen MyChart Message (if you have MyChart) OR . A paper copy in the mail If you have any lab test that is abnormal or we need to change your treatment, we will call you to review the results.   Testing/Procedures: none   Follow-Up: At [facility], you and your health needs are our priority.  As part of our continuing mission to provide you with exceptional heart care, we have created designated Provider Care Teams.  These Care Teams include your primary Cardiologist (physician) and Advanced Practice Providers (APPs -  Physician Assistants and Nurse Practitioners) who all work together to provide you with the care you need, when you need it.  We recommend signing up for the patient portal called "MyChart".  Sign up information is provided on this After Visit Summary.  MyChart is used to connect with patients for Virtual Visits (Telemedicine).  Patients are able to view lab/test results, encounter notes, upcoming appointments, etc.  Non-urgent messages can be sent to your provider as well.   To learn more about what you can do with MyChart, go to [email or web address].    Your next appointment:   6 month(s)  The format for your next appointment:   In Person  Provider:    You may see Nelva Bush, MD or one of the following Advanced Practice Providers on your designated Care Team:    Murray Hodgkins, NP  Christell Faith, PA-C  Marrianne Mood, PA-C

## 2019-04-04 ENCOUNTER — Encounter: Payer: Self-pay | Admitting: Internal Medicine

## 2019-04-04 DIAGNOSIS — I471 Supraventricular tachycardia: Secondary | ICD-10-CM | POA: Insufficient documentation

## 2019-04-04 DIAGNOSIS — R002 Palpitations: Secondary | ICD-10-CM | POA: Insufficient documentation

## 2019-04-04 DIAGNOSIS — R42 Dizziness and giddiness: Secondary | ICD-10-CM | POA: Insufficient documentation

## 2019-04-08 DIAGNOSIS — M1990 Unspecified osteoarthritis, unspecified site: Secondary | ICD-10-CM | POA: Diagnosis not present

## 2019-04-08 DIAGNOSIS — M0589 Other rheumatoid arthritis with rheumatoid factor of multiple sites: Secondary | ICD-10-CM | POA: Diagnosis not present

## 2019-04-08 DIAGNOSIS — Z79899 Other long term (current) drug therapy: Secondary | ICD-10-CM | POA: Diagnosis not present

## 2019-04-08 DIAGNOSIS — M6530 Trigger finger, unspecified finger: Secondary | ICD-10-CM | POA: Diagnosis not present

## 2019-04-08 DIAGNOSIS — M79643 Pain in unspecified hand: Secondary | ICD-10-CM | POA: Diagnosis not present

## 2019-04-08 DIAGNOSIS — E669 Obesity, unspecified: Secondary | ICD-10-CM | POA: Diagnosis not present

## 2019-04-09 ENCOUNTER — Other Ambulatory Visit: Payer: Self-pay

## 2019-04-09 ENCOUNTER — Ambulatory Visit: Payer: PPO | Attending: Otolaryngology | Admitting: Physical Therapy

## 2019-04-09 ENCOUNTER — Encounter: Payer: Self-pay | Admitting: Physical Therapy

## 2019-04-09 DIAGNOSIS — R262 Difficulty in walking, not elsewhere classified: Secondary | ICD-10-CM | POA: Diagnosis not present

## 2019-04-09 DIAGNOSIS — R42 Dizziness and giddiness: Secondary | ICD-10-CM

## 2019-04-09 DIAGNOSIS — R2681 Unsteadiness on feet: Secondary | ICD-10-CM | POA: Diagnosis not present

## 2019-04-09 NOTE — Therapy (Signed)
[place] MAIN [facility] SERVICES [address] [place], [place], [address] Phone: [phone]   Fax:  [phone]  Physical Therapy Treatment  Patient Details  Name: Jessica Kaufman MRN: [id number] Date of Birth: 1925-12-03 Referring Provider (PT): Dr. Carmin Richmond   Encounter Date: 04/09/2019  PT End of Session - 04/09/19 1428    Visit Number  7    Number of Visits  9    Date for PT Re-Evaluation  04/30/19    PT Start Time  1117    PT Stop Time  1201    PT Time Calculation (min)  44 min    Equipment Utilized During Treatment  Gait belt    Activity Tolerance  Patient tolerated treatment well    Behavior During Therapy  [facility] for tasks assessed/performed       Past Medical History:  Diagnosis Date  . Aortic valve disease   . Asthma   . Cancer ([place])    basal cell   . Gout   . Hyperlipidemia   . Hypertension   . Hypertensive heart disease without heart failure   . Hypothyroidism   . PAF (paroxysmal atrial fibrillation) ([place])   . PVC (premature ventricular contraction)   . Rheumatoid arthritis ([place])   . Ventricular premature depolarization     Past Surgical History:  Procedure Laterality Date  . BLADDER SURGERY  10/19/2010  . BONY PELVIS SURGERY  1990  . BREAST BIOPSY Left 1986  . CARDIAC SURGERY  2009   PVC  . CATARACT EXTRACTION Left 11/15/00  . CATARACT EXTRACTION Right   . ELBOW SURGERY  06/1990  . HIP SURGERY Left 05/28/09  . HIP SURGERY Right 06/30/03  . HYSTEROSCOPY  06/17/1986   vaginal    There were no vitals filed for this visit.  Subjective Assessment - 04/09/19 1120    Subjective  Patient states she got up this morning and was very woozy feeling. Patient states she saw the Rheumatologist yesterday and states she was given some neck rotation stretches and states she does not know if that contributed to her symptoms.    Pertinent History  Patient reports states she has had "woozy" and off balance sensations for about  2-3 years. Patient denies vertigo. Patient describes her dizziness as unsteadiness, lightheadedness and woozy. Patient uses a SPC. Patient reports she is getting dizziness several times a day depending on her activities. The symptom nature of patient's dizziness is motion provoked and intermittent. Patient reports the episodes of dizziness last minutes. Patient reports turning her head and body bring on her dizziness symptoms and lying down in the recliner and shutting her eyes makes the dizziness better. Patient reports she has saw Dr. Pryor Ochoa and she had a VNG test. 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 Patient reports she had vestibular therapy for a few months and states she felt it did help her some. Patient states states she sees double vision off and on for the past several years. Patient states some mornings she wakes up and sees double vision for a while. Patient states she has seen her eye doctor several times in regards to this issue.    Diagnostic tests  VNG test: 44% right UVH and abnormal smooth pursuits and saccades per MR    Patient Stated Goals  to have improved balance       Neuromuscular Re-education:  VOR X 1 exercise:  Patient performed  VOR X 1 horizontal in standing with conflicting background 2 reps of 1 minute each with chair in front to touch for support as needed.  Patient demonstrating good technique.   Step Taps: On firm surface, patient performed alternating step taps on 5" wooden step without UEs with CGA.   Step Ups: Patient performed 10 reps step up, over and return on 5" wooden step with faded upper extremity support progressing to one hand assist with CGA.  Patient performed 7 reps sideways step up over and return with one hand support.  Patient required sitting rest break after this activity.  Airex balance beam: On Airex balance beam, performed sideways stance  static holds with horizontal, vertical and diagonal head turns multiple 1 min reps of each with CGA.  On Airex balance beam, performed sideways stepping with horizontal head turns 5' times 4 reps with faded UE support progressing to intermittent support of one hand as needed with CGA. On Airex balance beam, performed static semi-tandem stance with alternating lead leg working to keep balance in midline as patient tends to lose balance to the right.  Patient required sitting rest break after this activity.    PT Education - 04/09/19 1426    Education Details  reviewed HEP- VOR with conflicting background, seated ball follows horizontal and vertical and ball circles and pt reports she has no questions or concerns about HEP. Patient reports she is doing VOR X1 with busy background at home.    Person(s) Educated  Patient    Methods  Explanation    Comprehension  Verbalized understanding       PT Short Term Goals - 04/09/19 1428      PT SHORT TERM GOAL #1   Title  Pt will be independent with HEP in order to improve balance and decrease dizziness symptoms in order to decrease fall risk and improve function at home.    Time  4    Period  Weeks    Status  Achieved    Target Date  04/02/19        PT Long Term Goals - 03/06/19 1318      PT LONG TERM GOAL #1   Title  Patient will reduce falls risk as indicated by Activities Specific Balance Confidence Scale (ABC) >67%.    Baseline  scored 43.8% on 03/05/19    Time  8    Period  Weeks    Status  New    Target Date  04/30/19      PT LONG TERM GOAL #2   Title  Patient will score 63/100 or greater on the FOTO survey to indicate improved function and activity level    Baseline  scored 53/100 on 03/05/19    Time  8    Period  Weeks    Status  New    Target Date  04/30/19      PT LONG TERM GOAL #3   Title  Patient will report 50% or greater improvement in her symptoms of dizziness and imbalance with provoking motions or positions    Time  8     Period  Weeks    Status  New    Target Date  04/30/19            Plan - 04/09/19 1449    Clinical Impression Statement  Patient motivated to session and fatigues with standing activities requiring a few short, sitting rest breaks. Patient able to perform step taps without UEs support this date. Patient  challenged by activities on Airex balance beam, but was able to progress to intermittent support of one hand with side stepping with head turns. Patient would benefit from continued PT services to further address functional deficits and goals as set on plan of care.    Personal Factors and Comorbidities  Comorbidity 3+;Past/Current Experience;Time since onset of injury/illness/exacerbation    Comorbidities  Atrial fibrillation, macular degeneration, double vision, HTN    Examination-Activity Limitations  Other   activities with head and body turns   Stability/Clinical Decision Making  Evolving/Moderate complexity    Rehab Potential  Good    PT Frequency  1x / week    PT Duration  8 weeks    PT Treatment/Interventions  Canalith Repostioning;Gait training;Stair training;Therapeutic activities;Therapeutic exercise;Balance training;Neuromuscular re-education;Patient/family education;Vestibular    PT Next Visit Plan  consider adding feet together and semi-tandem stance standing in corner with head turns to HEP next session, Consider trying mini-squats on Airex pad and slow marching on firm and then foam pad.    PT Home Exercise Plan  Access Code: [email or web address]    Consulted and Agree with Plan of Care  Patient       Patient will benefit from skilled therapeutic intervention in order to improve the following deficits and impairments:  Decreased activity tolerance, Decreased balance, Dizziness, Decreased mobility, Decreased strength, Difficulty walking, Decreased endurance  Visit Diagnosis: Dizziness and giddiness  Difficulty in walking, not elsewhere classified  Unsteadiness on  feet     Problem List Patient Active Problem List   Diagnosis Date Noted  . Palpitations 04/04/2019  . Dizziness 04/04/2019  . PSVT (paroxysmal supraventricular tachycardia) ([place]) 04/04/2019  . Near syncope 02/20/2019  . Labile hypertension 12/12/2017  . Paroxysmal atrial fibrillation ([place]) 12/12/2017  . Aortic valve disease 12/12/2017   Lady Deutscher PT, DPT [id number] Lady Deutscher 04/09/2019, 2:56 PM  [place] MAIN [facility] SERVICES [address] [place], [place], [address] Phone: [phone]   Fax:  [phone]  Name: Jessica Kaufman MRN: [id number] Date of Birth: 04-01-1925

## 2019-04-11 DIAGNOSIS — L97921 Non-pressure chronic ulcer of unspecified part of left lower leg limited to breakdown of skin: Secondary | ICD-10-CM | POA: Diagnosis not present

## 2019-04-16 ENCOUNTER — Other Ambulatory Visit: Payer: Self-pay

## 2019-04-16 ENCOUNTER — Ambulatory Visit: Payer: PPO | Admitting: Physical Therapy

## 2019-04-16 ENCOUNTER — Encounter: Payer: Self-pay | Admitting: Physical Therapy

## 2019-04-16 DIAGNOSIS — R42 Dizziness and giddiness: Secondary | ICD-10-CM | POA: Diagnosis not present

## 2019-04-16 DIAGNOSIS — R262 Difficulty in walking, not elsewhere classified: Secondary | ICD-10-CM

## 2019-04-16 DIAGNOSIS — R2681 Unsteadiness on feet: Secondary | ICD-10-CM

## 2019-04-16 NOTE — Therapy (Signed)
[place] MAIN [facility] SERVICES [address] [place], [place], [address] Phone: [phone]   Fax:  [phone]  Physical Therapy Treatment  Patient Details  Name: Jessica Kaufman MRN: [id number] Date of Birth: 07/01/1925 Referring Provider (PT): Dr. Carmin Richmond   Encounter Date: 04/16/2019  PT End of Session - 04/16/19 1349    Visit Number  8    Number of Visits  9    Date for PT Re-Evaluation  04/30/19    PT Start Time  1100    PT Stop Time  1150    PT Time Calculation (min)  50 min    Equipment Utilized During Treatment  Gait belt    Activity Tolerance  Patient tolerated treatment well    Behavior During Therapy  [facility] for tasks assessed/performed       Past Medical History:  Diagnosis Date  . Aortic valve disease   . Asthma   . Cancer ([place])    basal cell   . Gout   . Hyperlipidemia   . Hypertension   . Hypertensive heart disease without heart failure   . Hypothyroidism   . PAF (paroxysmal atrial fibrillation) ([place])   . PVC (premature ventricular contraction)   . Rheumatoid arthritis ([place])   . Ventricular premature depolarization     Past Surgical History:  Procedure Laterality Date  . BLADDER SURGERY  10/19/2010  . BONY PELVIS SURGERY  1990  . BREAST BIOPSY Left 1986  . CARDIAC SURGERY  2009   PVC  . CATARACT EXTRACTION Left 11/15/00  . CATARACT EXTRACTION Right   . ELBOW SURGERY  06/1990  . HIP SURGERY Left 05/28/09  . HIP SURGERY Right 06/30/03  . HYSTEROSCOPY  06/17/1986   vaginal    There were no vitals filed for this visit.  Subjective Assessment - 04/16/19 1103    Subjective  Patient states she has been doing pretty good with the exercises at home. Patient reports no changes with her dizziness symptoms. Patient states she read for a while yesterday and states that when she stopped and got up she was really dizzy.    Pertinent History  Patient reports states she has had "woozy" and off balance sensations for  about 2-3 years. Patient denies vertigo. Patient describes her dizziness as unsteadiness, lightheadedness and woozy. Patient uses a SPC. Patient reports she is getting dizziness several times a day depending on her activities. The symptom nature of patient's dizziness is motion provoked and intermittent. Patient reports the episodes of dizziness last minutes. Patient reports turning her head and body bring on her dizziness symptoms and lying down in the recliner and shutting her eyes makes the dizziness better. Patient reports she has saw Dr. Pryor Ochoa and she had a VNG test. 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 Patient reports she had vestibular therapy for a few months and states she felt it did help her some. Patient states states she sees double vision off and on for the past several years. Patient states some mornings she wakes up and sees double vision for a while. Patient states she has seen her eye doctor several times in regards to this issue.    Diagnostic tests  VNG test: 44% right UVH and abnormal smooth pursuits and saccades per MR    Patient Stated Goals  to have improved balance        Neuromuscular Re-education:  Airex balance beam: On  Airex balance beam, patient performed sidestepping left/right with horizontal head turns 6 reps times 5' each.  Patient holding arms away from her side at waist level to help her balance and patient utilized ankle and hip strategies with occasional reaching // bar for support due to losses of balance. Patient required CGA to Min A.   Slow Marching: Patient performed on firm surface slow marching 2 sets of 10 reps with 3-5 second holds with CGA. Patient performed activity without UEs support, but intermittent touching // bar due to loss of balance.  Patient has difficulty holding for 3 or more seconds.   Balance pod tapping: On firm surface, patient performed  foot tapping to balance pods in series of one, two and then three targets as called out by therapist. Patient occasionally touching // bar for support due to losses of balance and required assistance ranging from CGA to [employer] A at times. Patient holding arms away from body and at waist level.  Standing Hip exercises: Patient performed in // bars holding with B UEs hip flexion and hip ABD with #2 ankle weights 2 sets of 12 reps.   Patient required one short sitting rest break due to fatigue in legs during session and her oxygen saturation level was 96% and HR was 55 bpm.    PT Education - 04/16/19 1346    Education Details  discussed HEP and patient reports she has been doing sidestepping along her countertop at home. Discussed safety with this activity. Consider adding slow marching to HEP next session    Person(s) Educated  Patient    Methods  Explanation    Comprehension  Verbalized understanding       PT Short Term Goals - 04/09/19 1428      PT SHORT TERM GOAL #1   Title  Pt will be independent with HEP in order to improve balance and decrease dizziness symptoms in order to decrease fall risk and improve function at home.    Time  4    Period  Weeks    Status  Achieved    Target Date  04/02/19        PT Long Term Goals - 03/06/19 1318      PT LONG TERM GOAL #1   Title  Patient will reduce falls risk as indicated by Activities Specific Balance Confidence Scale (ABC) >67%.    Baseline  scored 43.8% on 03/05/19    Time  8    Period  Weeks    Status  New    Target Date  04/30/19      PT LONG TERM GOAL #2   Title  Patient will score 63/100 or greater on the FOTO survey to indicate improved function and activity level    Baseline  scored 53/100 on 03/05/19    Time  8    Period  Weeks    Status  New    Target Date  04/30/19      PT LONG TERM GOAL #3   Title  Patient will report 50% or greater improvement in her symptoms of dizziness and imbalance with provoking motions or  positions    Time  8    Period  Weeks    Status  New    Target Date  04/30/19            Plan - 04/16/19 1419    Clinical Impression Statement  Patient challenged by sidestepping with head turns on Airex balance beam but demonstrated good use  of hip and ankle strategies. Patient has difficult time with single leg stance activities but improved with practice during balance pod tapping activity. Patient reported fatigue in legs with standing hip flexion and abduction with #2 ankle weights. Will plan on repeating standing strengthening hip exercises, slow marching and repeating functional outcome measures next session.    Personal Factors and Comorbidities  Comorbidity 3+;Past/Current Experience;Time since onset of injury/illness/exacerbation    Comorbidities  Atrial fibrillation, macular degeneration, double vision, HTN    Examination-Activity Limitations  Other   activities with head and body turns   Stability/Clinical Decision Making  Evolving/Moderate complexity    Rehab Potential  Good    PT Frequency  1x / week    PT Duration  8 weeks    PT Treatment/Interventions  Canalith Repostioning;Gait training;Stair training;Therapeutic activities;Therapeutic exercise;Balance training;Neuromuscular re-education;Patient/family education;Vestibular    PT Next Visit Plan  consider adding feet together and semi-tandem stance standing in corner with head turns to HEP next session, Consider trying mini-squats on Airex pad and slow marching on firm and then foam pad.    PT Home Exercise Plan  Access Code: [email or web address]    Consulted and Agree with Plan of Care  Patient       Patient will benefit from skilled therapeutic intervention in order to improve the following deficits and impairments:  Decreased activity tolerance, Decreased balance, Dizziness, Decreased mobility, Decreased strength, Difficulty walking, Decreased endurance  Visit Diagnosis: Dizziness and giddiness  Difficulty in walking, not  elsewhere classified  Unsteadiness on feet     Problem List Patient Active Problem List   Diagnosis Date Noted  . Palpitations 04/04/2019  . Dizziness 04/04/2019  . PSVT (paroxysmal supraventricular tachycardia) ([place]) 04/04/2019  . Near syncope 02/20/2019  . Labile hypertension 12/12/2017  . Paroxysmal atrial fibrillation ([place]) 12/12/2017  . Aortic valve disease 12/12/2017    Lady Deutscher 04/16/2019, 2:26 PM  [place] MAIN [facility] SERVICES [address] [place], [place], [address] Phone: [phone]   Fax:  [phone]  Name: Jessica Kaufman MRN: [id number] Date of Birth: 04/13/1925

## 2019-04-23 ENCOUNTER — Ambulatory Visit: Payer: PPO | Admitting: Physical Therapy

## 2019-04-23 ENCOUNTER — Encounter: Payer: Self-pay | Admitting: Physical Therapy

## 2019-04-23 ENCOUNTER — Other Ambulatory Visit: Payer: Self-pay

## 2019-04-23 DIAGNOSIS — R42 Dizziness and giddiness: Secondary | ICD-10-CM | POA: Diagnosis not present

## 2019-04-23 DIAGNOSIS — R262 Difficulty in walking, not elsewhere classified: Secondary | ICD-10-CM

## 2019-04-23 DIAGNOSIS — R2681 Unsteadiness on feet: Secondary | ICD-10-CM

## 2019-04-23 NOTE — Therapy (Signed)
[place] MAIN [facility] SERVICES [address] [place], [place], [address] Phone: [phone]   Fax:  [phone]  Physical Therapy Treatment  Patient Details  Name: Jessica Kaufman MRN: [id number] Date of Birth: 1925/07/16 Referring Provider (PT): Dr. Carmin Richmond   Encounter Date: 04/23/2019  PT End of Session - 04/23/19 1429    Visit Number  9    Number of Visits  9    Date for PT Re-Evaluation  04/30/19    PT Start Time  1117    PT Stop Time  1159    PT Time Calculation (min)  42 min    Equipment Utilized During Treatment  Gait belt    Activity Tolerance  Patient tolerated treatment well    Behavior During Therapy  [facility] for tasks assessed/performed       Past Medical History:  Diagnosis Date  . Aortic valve disease   . Asthma   . Cancer ([place])    basal cell   . Gout   . Hyperlipidemia   . Hypertension   . Hypertensive heart disease without heart failure   . Hypothyroidism   . PAF (paroxysmal atrial fibrillation) ([place])   . PVC (premature ventricular contraction)   . Rheumatoid arthritis ([place])   . Ventricular premature depolarization     Past Surgical History:  Procedure Laterality Date  . BLADDER SURGERY  10/19/2010  . BONY PELVIS SURGERY  1990  . BREAST BIOPSY Left 1986  . CARDIAC SURGERY  2009   PVC  . CATARACT EXTRACTION Left 11/15/00  . CATARACT EXTRACTION Right   . ELBOW SURGERY  06/1990  . HIP SURGERY Left 05/28/09  . HIP SURGERY Right 06/30/03  . HYSTEROSCOPY  06/17/1986   vaginal    There were no vitals filed for this visit.  Subjective Assessment - 04/23/19 1121    Subjective  Patient states one day was bad with dizziness this past week and otherwise she states she has been doing fairly good.    Pertinent History  Patient reports states she has had "woozy" and off balance sensations for about 2-3 years. Patient denies vertigo. Patient describes her dizziness as unsteadiness, lightheadedness and woozy. Patient  uses a SPC. Patient reports she is getting dizziness several times a day depending on her activities. The symptom nature of patient's dizziness is motion provoked and intermittent. Patient reports the episodes of dizziness last minutes. Patient reports turning her head and body bring on her dizziness symptoms and lying down in the recliner and shutting her eyes makes the dizziness better. Patient reports she has saw Dr. Pryor Ochoa and she had a VNG test. 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 Patient reports she had vestibular therapy for a few months and states she felt it did help her some. Patient states states she sees double vision off and on for the past several years. Patient states some mornings she wakes up and sees double vision for a while. Patient states she has seen her eye doctor several times in regards to this issue.    Diagnostic tests  VNG test: 44% right UVH and abnormal smooth pursuits and saccades per MR    Patient Stated Goals  to have improved balance       Neuromuscular Re-education:  Standing Hip exercises: Patient performed in // bars holding with B UEs hip flexion, hip ABD and hip extension with #2 ankle weights 2 sets of  15 reps.  Patient required one short sitting rest break due to fatigue in legs between first and second sets.   Airex balance beam: On Airex balance beam, performed sideways stance static holds with body turns.  Then, performed static holds with horizontal and vertical head turns.  On Airex balance beam, performed sideways stepping with horizontal head turns 5' times multiple reps. Patient initially performed these activities with arms away from her side at waist level but was able to relax her arms after cuing with CGA.  Patient utilizing ankle and hip strategies primarily with occasional reaching for support.   Balance pod tapping: On firm surface, patient  performed foot tapping to balance pods in series of one and targets as called out by therapist.  Patient had more difficulty standing on left leg than right.  Patient occasionally touching // bar for support due to losses of balance and required assistance ranging from CGA to Min A at times.     PT Education - 04/23/19 1427    Education Details  discussed plan of care and plan on retesting outcome measures next visit.    Person(s) Educated  Patient    Methods  Explanation    Comprehension  Verbalized understanding       PT Short Term Goals - 04/09/19 1428      PT SHORT TERM GOAL #1   Title  Pt will be independent with HEP in order to improve balance and decrease dizziness symptoms in order to decrease fall risk and improve function at home.    Time  4    Period  Weeks    Status  Achieved    Target Date  04/02/19        PT Long Term Goals - 03/06/19 1318      PT LONG TERM GOAL #1   Title  Patient will reduce falls risk as indicated by Activities Specific Balance Confidence Scale (ABC) >67%.    Baseline  scored 43.8% on 03/05/19    Time  8    Period  Weeks    Status  New    Target Date  04/30/19      PT LONG TERM GOAL #2   Title  Patient will score 63/100 or greater on the FOTO survey to indicate improved function and activity level    Baseline  scored 53/100 on 03/05/19    Time  8    Period  Weeks    Status  New    Target Date  04/30/19      PT LONG TERM GOAL #3   Title  Patient will report 50% or greater improvement in her symptoms of dizziness and imbalance with provoking motions or positions    Time  8    Period  Weeks    Status  New    Target Date  04/30/19            Plan - 04/23/19 1451    Clinical Impression Statement  Patient motivated to session. Patient able to progress to 2 sets of 15 reps and added hip extension to standing hip strengthening exercises. Patient continues to have difficulty with single leg stance transitional activities such as balance  pod tapping. Patient improving with Airex balance beam exercises and was able to perform with arms in lower position. Will plan on repeating functional outcome measures next session.    Personal Factors and Comorbidities  Comorbidity 3+;Past/Current Experience;Time since onset of injury/illness/exacerbation    Comorbidities  Atrial fibrillation, macular  degeneration, double vision, HTN    Examination-Activity Limitations  Other   activities with head and body turns   Stability/Clinical Decision Making  Evolving/Moderate complexity    Rehab Potential  Good    PT Frequency  1x / week    PT Duration  8 weeks    PT Treatment/Interventions  Canalith Repostioning;Gait training;Stair training;Therapeutic activities;Therapeutic exercise;Balance training;Neuromuscular re-education;Patient/family education;Vestibular    PT Next Visit Plan  consider adding feet together and semi-tandem stance standing in corner with head turns to HEP next session, Consider trying mini-squats on Airex pad and slow marching on firm and then foam pad.    PT Home Exercise Plan  Access Code: [email or web address]    Consulted and Agree with Plan of Care  Patient       Patient will benefit from skilled therapeutic intervention in order to improve the following deficits and impairments:  Decreased activity tolerance, Decreased balance, Dizziness, Decreased mobility, Decreased strength, Difficulty walking, Decreased endurance  Visit Diagnosis: Dizziness and giddiness  Difficulty in walking, not elsewhere classified  Unsteadiness on feet     Problem List Patient Active Problem List   Diagnosis Date Noted  . Palpitations 04/04/2019  . Dizziness 04/04/2019  . PSVT (paroxysmal supraventricular tachycardia) ([place]) 04/04/2019  . Near syncope 02/20/2019  . Labile hypertension 12/12/2017  . Paroxysmal atrial fibrillation ([place]) 12/12/2017  . Aortic valve disease 12/12/2017   Lady Deutscher PT, DPT [phone] Lady Deutscher 04/23/2019,  3:01 PM  [place] MAIN [facility] [address] [place], [place], [address] Phone: [phone]   Fax:  [phone]  Name: KENYARDA CARDILLO MRN: [id number] Date of Birth: 07-Feb-1925

## 2019-04-25 DIAGNOSIS — Z1231 Encounter for screening mammogram for malignant neoplasm of breast: Secondary | ICD-10-CM | POA: Diagnosis not present

## 2019-04-25 DIAGNOSIS — M85831 Other specified disorders of bone density and structure, right forearm: Secondary | ICD-10-CM | POA: Diagnosis not present

## 2019-04-25 DIAGNOSIS — M810 Age-related osteoporosis without current pathological fracture: Secondary | ICD-10-CM | POA: Diagnosis not present

## 2019-04-30 ENCOUNTER — Other Ambulatory Visit: Payer: Self-pay

## 2019-04-30 ENCOUNTER — Ambulatory Visit: Payer: PPO | Admitting: Physical Therapy

## 2019-04-30 ENCOUNTER — Encounter: Payer: Self-pay | Admitting: Physical Therapy

## 2019-04-30 DIAGNOSIS — R42 Dizziness and giddiness: Secondary | ICD-10-CM

## 2019-04-30 DIAGNOSIS — R2681 Unsteadiness on feet: Secondary | ICD-10-CM

## 2019-04-30 DIAGNOSIS — R262 Difficulty in walking, not elsewhere classified: Secondary | ICD-10-CM

## 2019-04-30 NOTE — Therapy (Signed)
[place] MAIN [facility] SERVICES [address] [place], [place], [address] Phone: [phone]   Fax:  [phone]  Physical Therapy Treatment/Recertification Dates of service: 03/05/2018-04/30/2019 Total Number of visits: 10  Patient Details  Name: Jessica Kaufman MRN: [id number] Date of Birth: [birth year] Referring Provider (PT): Dr. Carmin Richmond   Encounter Date: 04/30/2019  PT End of Session - 04/30/19 1117    Visit Number  10    Number of Visits  14    Date for PT Re-Evaluation  05/28/19    PT Start Time  1116    PT Stop Time  1200    PT Time Calculation (min)  44 min    Equipment Utilized During Treatment  Gait belt    Activity Tolerance  Patient tolerated treatment well    Behavior During Therapy  [facility] for tasks assessed/performed       Past Medical History:  Diagnosis Date  . Aortic valve disease   . Asthma   . Cancer ([place])    basal cell   . Gout   . Hyperlipidemia   . Hypertension   . Hypertensive heart disease without heart failure   . Hypothyroidism   . PAF (paroxysmal atrial fibrillation) ([place])   . PVC (premature ventricular contraction)   . Rheumatoid arthritis ([place])   . Ventricular premature depolarization     Past Surgical History:  Procedure Laterality Date  . BLADDER SURGERY  10/19/2010  . BONY PELVIS SURGERY  1990  . BREAST BIOPSY Left 1986  . CARDIAC SURGERY  2009   PVC  . CATARACT EXTRACTION Left 11/15/00  . CATARACT EXTRACTION Right   . ELBOW SURGERY  06/1990  . HIP SURGERY Left 05/28/09  . HIP SURGERY Right 06/30/03  . HYSTEROSCOPY  06/17/1986   vaginal    There were no vitals filed for this visit.  Subjective Assessment - 04/30/19 1120    Subjective  Patient states she did very well this past week. Patient states her head seemed to be okay except for sinus stuff.    Pertinent History  Patient reports states she has had "woozy" and off balance sensations for about 2-3 years. Patient denies vertigo.  Patient describes her dizziness as unsteadiness, lightheadedness and woozy. Patient uses a SPC. Patient reports she is getting dizziness several times a day depending on her activities. The symptom nature of patient's dizziness is motion provoked and intermittent. Patient reports the episodes of dizziness last minutes. Patient reports turning her head and body bring on her dizziness symptoms and lying down in the recliner and shutting her eyes makes the dizziness better. Patient reports she has saw Dr. Pryor Ochoa and she had a VNG test. 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 Patient reports she had vestibular therapy for a few months and states she felt it did help her some. Patient states states she sees double vision off and on for the past several years. Patient states some mornings she wakes up and sees double vision for a while. Patient states she has seen her eye doctor several times in regards to this issue.    Diagnostic tests  VNG test: 44% right UVH and abnormal smooth pursuits and saccades per MR    Patient Stated Goals  to have improved balance       Neuromuscular Re-education:  Mini-squats: Patient performed 2 sets of 12 reps mini-squats with 5 second holds with verbal cues for  technique with CGA. Patient holding // bar with B UEs support and instructed to use as little UEs support as patient can.   Slow Marching: Patient performed on firm surface slow marching with 5 second holds 2 sets of 12 reps with CGA. Patient holding // bar with one hand intermittently for balance.  Discussed safety precautions with performing HEP at home. Demonstrated and discussed standing in corner with chair in front for safety and then patient demonstrated. Added slow marching and mini-squats standing in corner with locked walker in front or a chair for HEP and discussed safety precautions with exercises.    FUNCTIONAL OUTCOME MEASURES:  Results Comments  DHI 24/100 low perception of handicap  ABC Scale 54.4% Falls risk; in need of intervention  FOTO 53/100 Below expected values as compared to like stratified patients     PT Education - 04/30/19 1921    Education Details  discussed functional outcome repeat testing and compared to prior testing; discussed plan of care  Added slow marching and mini-squats standing in corner with locked walker in front or a chair for HEP and discussed safety precautions with exercises.    Person(s) Educated  Patient;Child(ren)   daughter   Methods  Explanation    Comprehension  Verbalized understanding       PT Short Term Goals - 04/09/19 1428      PT SHORT TERM GOAL #1   Title  Pt will be independent with HEP in order to improve balance and decrease dizziness symptoms in order to decrease fall risk and improve function at home.    Time  4    Period  Weeks    Status  Achieved    Target Date  04/02/19        PT Long Term Goals - 04/30/19 1141      PT LONG TERM GOAL #1   Title  Patient will reduce falls risk as indicated by Activities Specific Balance Confidence Scale (ABC) >67%.    Baseline  scored 43.8% on 03/05/19; scored 54.4% on 04/30/2019    Time  8    Period  Weeks    Status  Partially Met    Target Date  05/28/19      PT LONG TERM GOAL #2   Title  Patient will score 63/100 or greater on the FOTO survey to indicate improved function and activity level    Baseline  scored 53/100 on 03/05/19    Time  8    Period  Weeks    Status  On-going    Target Date  05/28/19      PT LONG TERM GOAL #3   Title  Patient will report 50% or greater improvement in her symptoms of dizziness and imbalance with provoking motions or positions    Baseline  Patient reports 50% improvement in her symptoms since starting therapy on 04/30/2019.    Time  8    Period  Weeks    Status  Achieved    Target Date  05/28/19            Plan - 04/30/19 1924     Clinical Impression Statement  Patient reports that she did well this past week in regards to her dizziness symptoms. Repeated functional outcome testing this date and updated goals. Patient has met 1/1 short term goals. Patient met long term goal #3 stating that she as improved 50% or great in her overall symptoms since onset of therapy. Patient partially met long term goal #1  as she improved from 43.8% to 54.4% on the ABC scale, and long term goal #2 is ongoing of FOTO score. Patient has demonstrated improvement in decreased dizziness and improved balance but would benefit from continued PT services to further work towards making improvements in patients strength, balance and decreasing dizziness symptoms. Patient continues to be at risk for falls as evidenced by ABC score of 54.4%. Patient in agreeement with continuing PT services at this time to try to make additional progress in these areas.    Personal Factors and Comorbidities  Comorbidity 3+;Past/Current Experience;Time since onset of injury/illness/exacerbation    Comorbidities  Atrial fibrillation, macular degeneration, double vision, HTN    Examination-Activity Limitations  Other   activities with head and body turns   Stability/Clinical Decision Making  Evolving/Moderate complexity    Rehab Potential  Good    PT Frequency  1x / week    PT Duration  8 weeks    PT Treatment/Interventions  Canalith Repostioning;Gait training;Stair training;Therapeutic activities;Therapeutic exercise;Balance training;Neuromuscular re-education;Patient/family education;Vestibular    PT Next Visit Plan  consider adding feet together and semi-tandem stance standing in corner with head turns to HEP next session, Consider trying mini-squats on Airex pad and slow marching on firm and then foam pad.    PT Home Exercise Plan  Access Code: [email or web address]; Added slow marching and mini-squats standing in corner with locked walker in front or a chair for HEP and discussed safety  precautions with exercises.    Consulted and Agree with Plan of Care  Patient       Patient will benefit from skilled therapeutic intervention in order to improve the following deficits and impairments:  Decreased activity tolerance, Decreased balance, Dizziness, Decreased mobility, Decreased strength, Difficulty walking, Decreased endurance  Visit Diagnosis: Dizziness and giddiness  Difficulty in walking, not elsewhere classified  Unsteadiness on feet     Problem List Patient Active Problem List   Diagnosis Date Noted  . Palpitations 04/04/2019  . Dizziness 04/04/2019  . PSVT (paroxysmal supraventricular tachycardia) ([place]) 04/04/2019  . Near syncope 02/20/2019  . Labile hypertension 12/12/2017  . Paroxysmal atrial fibrillation ([place]) 12/12/2017  . Aortic valve disease 12/12/2017   Lady Deutscher PT, DPT [phone] Lady Deutscher 04/30/2019, 7:32 PM  [place] MAIN [facility] SERVICES [address] [place], [place], [address] Phone: [phone]   Fax:  [phone]  Name: JEILYN REZNIK MRN: [id number] Date of Birth: [birth year]

## 2019-05-06 ENCOUNTER — Other Ambulatory Visit: Payer: Self-pay

## 2019-05-06 MED ORDER — LOSARTAN POTASSIUM 50 MG PO TABS: 50.0000 mg | ORAL_TABLET | Freq: Two times a day (BID) | ORAL | 1 refills | Status: DC

## 2019-05-07 ENCOUNTER — Other Ambulatory Visit: Payer: Self-pay

## 2019-05-07 ENCOUNTER — Encounter: Payer: Self-pay | Admitting: Physical Therapy

## 2019-05-07 ENCOUNTER — Ambulatory Visit: Payer: PPO | Attending: Otolaryngology | Admitting: Physical Therapy

## 2019-05-07 DIAGNOSIS — R262 Difficulty in walking, not elsewhere classified: Secondary | ICD-10-CM | POA: Insufficient documentation

## 2019-05-07 DIAGNOSIS — R42 Dizziness and giddiness: Secondary | ICD-10-CM | POA: Diagnosis not present

## 2019-05-07 DIAGNOSIS — R2681 Unsteadiness on feet: Secondary | ICD-10-CM | POA: Insufficient documentation

## 2019-05-07 NOTE — Therapy (Signed)
[place] MAIN [facility] SERVICES [address] [place], [place], [address] Phone: [phone]   Fax:  [phone]  Physical Therapy Treatment  Patient Details  Name: Jessica Kaufman MRN: [id number] Date of Birth: 1925/01/14 Referring Provider (PT): Dr. Carmin Richmond   Encounter Date: 05/07/2019  PT End of Session - 05/07/19 1303    Visit Number  11    Number of Visits  14    Date for PT Re-Evaluation  05/28/19    PT Start Time  1302    PT Stop Time  1342    PT Time Calculation (min)  40 min    Equipment Utilized During Treatment  Gait belt    Activity Tolerance  Patient tolerated treatment well    Behavior During Therapy  [facility] for tasks assessed/performed       Past Medical History:  Diagnosis Date  . Aortic valve disease   . Asthma   . Cancer ([place])    basal cell   . Gout   . Hyperlipidemia   . Hypertension   . Hypertensive heart disease without heart failure   . Hypothyroidism   . PAF (paroxysmal atrial fibrillation) ([place])   . PVC (premature ventricular contraction)   . Rheumatoid arthritis ([place])   . Ventricular premature depolarization     Past Surgical History:  Procedure Laterality Date  . BLADDER SURGERY  10/19/2010  . BONY PELVIS SURGERY  1990  . BREAST BIOPSY Left 1986  . CARDIAC SURGERY  2009   PVC  . CATARACT EXTRACTION Left 11/15/00  . CATARACT EXTRACTION Right   . ELBOW SURGERY  06/1990  . HIP SURGERY Left 05/28/09  . HIP SURGERY Right 06/30/03  . HYSTEROSCOPY  06/17/1986   vaginal    There were no vitals filed for this visit.  Subjective Assessment - 05/07/19 1302    Subjective  Patient reports no falls or issues since last visit. Patient reports she did pretty good with the new exercises of slow marching and mini-squats standing in the corner with chair in front. Patient states she has been doing the exercises each day this past week and that she has been taking a walk. She has a tri-wheel walker for when she  takes a walk outside.    Pertinent History  Patient reports states she has had "woozy" and off balance sensations for about 2-3 years. Patient denies vertigo. Patient describes her dizziness as unsteadiness, lightheadedness and woozy. Patient uses a SPC. Patient reports she is getting dizziness several times a day depending on her activities. The symptom nature of patient's dizziness is motion provoked and intermittent. Patient reports the episodes of dizziness last minutes. Patient reports turning her head and body bring on her dizziness symptoms and lying down in the recliner and shutting her eyes makes the dizziness better. Patient reports she has saw Dr. Pryor Ochoa and she had a VNG test. 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 Patient reports she had vestibular therapy for a few months and states she felt it did help her some. Patient states states she sees double vision off and on for the past several years. Patient states some mornings she wakes up and sees double vision for a while. Patient states she has seen her eye doctor several times in regards to this issue.    Diagnostic tests  VNG test: 44% right UVH and abnormal smooth pursuits and saccades per MR  Patient Stated Goals  to have improved balance        Therapeutic Exercise:  Standing Hip exercises: Patient performed in // bars holding with B UEs hip flexion, hip ABD and hip extension with #3 ankle weights 2 sets of 15 reps with verbal cues for trunk posture.  Patient required one sitting rest break due to fatigue in legs after second set.  Neuromuscular Re-education:  Step Taps: On firm surface, patient performed alternating step taps on 5" wooden step.  Patient performed 2 sets of 10 reps without UEs support with CGA/MinA.  Patient reaching to touch // bars at times due to imbalance. Patient had 3 episodes of not lifting foot high enough  to clear step and required assistance to correct one of those times.  Airex pad:  On Airex pad, patient performed narrow BOS about 1-2" apart and semi-tandem progressions with alternating lead leg with and without horizontal and vertical head turns.  Patient performed narrow BOS and semi-tandem stance with alternating lead leg eyes closed static 30 second holds with CGA. Patient with increased sway and utilized good ankle and hip strategies for balance.  Patient reports fatigue in legs at end of session.  Patient motivated to session and performed standing LE strengthening and balance exercises. Patient able to progress to #3 ankle weights with standing hip exercises bilaterally. Patient continues to have difficulty with single leg stance activities and was challenged by alternating step taps exercise. Will plan on repeating step tapping and try sidestepping on 5" step next visit. Patient would benefit from continued PT services to further address functional deficits and goals.      PT Short Term Goals - 04/09/19 1428      PT SHORT TERM GOAL #1   Title  Pt will be independent with HEP in order to improve balance and decrease dizziness symptoms in order to decrease fall risk and improve function at home.    Time  4    Period  Weeks    Status  Achieved    Target Date  04/02/19        PT Long Term Goals - 04/30/19 1141      PT LONG TERM GOAL #1   Title  Patient will reduce falls risk as indicated by Activities Specific Balance Confidence Scale (ABC) >67%.    Baseline  scored 43.8% on 03/05/19; scored 54.4% on 04/30/2019    Time  8    Period  Weeks    Status  Partially Met    Target Date  05/28/19      PT LONG TERM GOAL #2   Title  Patient will score 63/100 or greater on the FOTO survey to indicate improved function and activity level    Baseline  scored 53/100 on 03/05/19    Time  8    Period  Weeks    Status  On-going    Target Date  05/28/19      PT LONG TERM GOAL #3   Title   Patient will report 50% or greater improvement in her symptoms of dizziness and imbalance with provoking motions or positions    Baseline  Patient reports 50% improvement in her symptoms since starting therapy on 04/30/2019.    Time  8    Period  Weeks    Status  Achieved    Target Date  05/28/19            Plan - 05/07/19 1303    Clinical Impression Statement  Patient  motivated to session and performed standing LE strengthening and balance exercises. Patient able to progress to #3 ankle weights with standing hip exercises bilaterally. Patient continues to have difficulty with single leg stance activities and was challenged by alternating step taps exercise. Will plan on repeating step tapping and try sidestepping on 5" step next visit. Patient would benefit from continued PT services to further address functional deficits and goals.    Personal Factors and Comorbidities  Comorbidity 3+;Past/Current Experience;Time since onset of injury/illness/exacerbation    Comorbidities  Atrial fibrillation, macular degeneration, double vision, HTN    Examination-Activity Limitations  Other   activities with head and body turns   Stability/Clinical Decision Making  Evolving/Moderate complexity    Rehab Potential  Good    PT Frequency  1x / week    PT Duration  8 weeks    PT Treatment/Interventions  Canalith Repostioning;Gait training;Stair training;Therapeutic activities;Therapeutic exercise;Balance training;Neuromuscular re-education;Patient/family education;Vestibular    PT Next Visit Plan  consider adding feet together and semi-tandem stance standing in corner with head turns to HEP next session, Consider trying mini-squats on Airex pad and slow marching on firm and then foam pad.    PT Home Exercise Plan  Access Code: [email or web address]; Added slow marching and mini-squats standing in corner with locked walker in front or a chair for HEP and discussed safety precautions with exercises.    Consulted and Agree  with Plan of Care  Patient       Patient will benefit from skilled therapeutic intervention in order to improve the following deficits and impairments:  Decreased activity tolerance, Decreased balance, Dizziness, Decreased mobility, Decreased strength, Difficulty walking, Decreased endurance  Visit Diagnosis: Dizziness and giddiness  Difficulty in walking, not elsewhere classified  Unsteadiness on feet     Problem List Patient Active Problem List   Diagnosis Date Noted  . Palpitations 04/04/2019  . Dizziness 04/04/2019  . PSVT (paroxysmal supraventricular tachycardia) ([place]) 04/04/2019  . Near syncope 02/20/2019  . Labile hypertension 12/12/2017  . Paroxysmal atrial fibrillation ([place]) 12/12/2017  . Aortic valve disease 12/12/2017   Lady Deutscher PT, DPT [phone] Lady Deutscher 05/07/2019, 2:49 PM  [place] MAIN [facility] SERVICES [address] [place], [place], [address] Phone: [phone]   Fax:  [phone]  Name: Jessica Kaufman MRN: [id number] Date of Birth: [birth year]

## 2019-05-14 ENCOUNTER — Other Ambulatory Visit: Payer: Self-pay

## 2019-05-14 ENCOUNTER — Ambulatory Visit: Payer: PPO | Admitting: Physical Therapy

## 2019-05-14 ENCOUNTER — Encounter: Payer: Self-pay | Admitting: Physical Therapy

## 2019-05-14 VITALS — BP 155/63

## 2019-05-14 DIAGNOSIS — R2681 Unsteadiness on feet: Secondary | ICD-10-CM

## 2019-05-14 DIAGNOSIS — R42 Dizziness and giddiness: Secondary | ICD-10-CM | POA: Diagnosis not present

## 2019-05-14 DIAGNOSIS — R262 Difficulty in walking, not elsewhere classified: Secondary | ICD-10-CM

## 2019-05-14 MED ORDER — METOPROLOL SUCCINATE ER 25 MG PO TB24: 25.0000 mg | ORAL_TABLET | Freq: Every day | ORAL | 1 refills | Status: DC

## 2019-05-14 NOTE — Therapy (Signed)
[place] MAIN [facility] SERVICES [address] [place], [place], [address] Phone: [phone]   Fax:  [phone]  Physical Therapy Treatment  Patient Details  Name: Jessica Kaufman MRN: [id number] Date of Birth: Jan 28, 1925 Referring Provider (PT): Dr. Carmin Richmond   Encounter Date: 05/14/2019  PT End of Session - 05/14/19 1036    Visit Number  12    Number of Visits  14    Date for PT Re-Evaluation  05/28/19    PT Start Time  1033    PT Stop Time  1112    PT Time Calculation (min)  39 min    Equipment Utilized During Treatment  Gait belt    Activity Tolerance  Patient tolerated treatment well    Behavior During Therapy  [facility] for tasks assessed/performed       Past Medical History:  Diagnosis Date  . Aortic valve disease   . Asthma   . Cancer ([place])    basal cell   . Gout   . Hyperlipidemia   . Hypertension   . Hypertensive heart disease without heart failure   . Hypothyroidism   . PAF (paroxysmal atrial fibrillation) ([place])   . PVC (premature ventricular contraction)   . Rheumatoid arthritis ([place])   . Ventricular premature depolarization     Past Surgical History:  Procedure Laterality Date  . BLADDER SURGERY  10/19/2010  . BONY PELVIS SURGERY  1990  . BREAST BIOPSY Left 1986  . CARDIAC SURGERY  2009   PVC  . CATARACT EXTRACTION Left 11/15/00  . CATARACT EXTRACTION Right   . ELBOW SURGERY  06/1990  . HIP SURGERY Left 05/28/09  . HIP SURGERY Right 06/30/03  . HYSTEROSCOPY  06/17/1986   vaginal    Vitals:   05/14/19 1045  BP: (!) 155/63    Subjective Assessment - 05/14/19 1035    Subjective  Patient states she was fine this past week with doing her exercises and states she did the exercises standing in a corner. Patient states her legs were not sore after last session.    Pertinent History  Patient reports states she has had "woozy" and off balance sensations for about 2-3 years. Patient denies vertigo. Patient describes  her dizziness as unsteadiness, lightheadedness and woozy. Patient uses a SPC. Patient reports she is getting dizziness several times a day depending on her activities. The symptom nature of patient's dizziness is motion provoked and intermittent. Patient reports the episodes of dizziness last minutes. Patient reports turning her head and body bring on her dizziness symptoms and lying down in the recliner and shutting her eyes makes the dizziness better. Patient reports she has saw Dr. Pryor Ochoa and she had a VNG test. 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 Patient reports she had vestibular therapy for a few months and states she felt it did help her some. Patient states states she sees double vision off and on for the past several years. Patient states some mornings she wakes up and sees double vision for a while. Patient states she has seen her eye doctor several times in regards to this issue.    Diagnostic tests  VNG test: 44% right UVH and abnormal smooth pursuits and saccades per MR    Patient Stated Goals  to have improved balance       Therapeutic Exercise:  Standing Hip exercises: Patient performed in // bars holding with B UEs  hip flexion, hip ABD and hip extension with #3 ankle weights 2 sets of 15 reps with verbal cues for trunk posture.  Patient required one sitting rest break due to fatigue in legs after second set.  Seated exercises:  In sitting with 3# ankle weights, performed 2 sets of 15 reps hip flexion and LAQ. Patient reports hip flexion in sitting is challenging and fatigues towards the end of each set.   Step Taps: On firm surface, patient performed alternating step taps on 5" wooden step.  Patient performed 2 sets of 10 reps without UEs support with CGA.  Patient reaching to touch // bars at times due to imbalance.  Worked on sidestepping up and over and return on 5" wooden step  with light UEs support on // bars 5 reps.  Patient had difficulty stepping up with right leg.      PT Education - 05/15/19 1705    Education Details  exercise technique    Person(s) Educated  Patient    Methods  Explanation    Comprehension  Verbalized understanding       PT Short Term Goals - 04/09/19 1428      PT SHORT TERM GOAL #1   Title  Pt will be independent with HEP in order to improve balance and decrease dizziness symptoms in order to decrease fall risk and improve function at home.    Time  4    Period  Weeks    Status  Achieved    Target Date  04/02/19        PT Long Term Goals - 04/30/19 1141      PT LONG TERM GOAL #1   Title  Patient will reduce falls risk as indicated by Activities Specific Balance Confidence Scale (ABC) >67%.    Baseline  scored 43.8% on 03/05/19; scored 54.4% on 04/30/2019    Time  8    Period  Weeks    Status  Partially Met    Target Date  05/28/19      PT LONG TERM GOAL #2   Title  Patient will score 63/100 or greater on the FOTO survey to indicate improved function and activity level    Baseline  scored 53/100 on 03/05/19    Time  8    Period  Weeks    Status  On-going    Target Date  05/28/19      PT LONG TERM GOAL #3   Title  Patient will report 50% or greater improvement in her symptoms of dizziness and imbalance with provoking motions or positions    Baseline  Patient reports 50% improvement in her symptoms since starting therapy on 04/30/2019.    Time  8    Period  Weeks    Status  Achieved    Target Date  05/28/19            Plan - 05/15/19 1708    Clinical Impression Statement  Patient reports compliance with home exercise program. Patient challenged by seated hip flexion weighted exercises as well as sidestepping and step tap exercises, but patient is improving with practice with single leg stance exercises. Patient did well with standing hip exercises weighted requiring decreased rest breaks this date. Patient would  benefit from continued PT services to further address goals and functional deficits.    Personal Factors and Comorbidities  Comorbidity 3+;Past/Current Experience;Time since onset of injury/illness/exacerbation    Comorbidities  Atrial fibrillation, macular degeneration, double vision, HTN    Examination-Activity  Limitations  Other   activities with head and body turns   Stability/Clinical Decision Making  Evolving/Moderate complexity    Rehab Potential  Good    PT Frequency  1x / week    PT Duration  8 weeks    PT Treatment/Interventions  Canalith Repostioning;Gait training;Stair training;Therapeutic activities;Therapeutic exercise;Balance training;Neuromuscular re-education;Patient/family education;Vestibular    PT Next Visit Plan  consider adding feet together and semi-tandem stance standing in corner with head turns to HEP next session, Consider trying mini-squats on Airex pad and slow marching on firm and then foam pad.    PT Home Exercise Plan  Access Code: [email or web address]; Added slow marching and mini-squats standing in corner with locked walker in front or a chair for HEP and discussed safety precautions with exercises.    Consulted and Agree with Plan of Care  Patient       Patient will benefit from skilled therapeutic intervention in order to improve the following deficits and impairments:  Decreased activity tolerance, Decreased balance, Dizziness, Decreased mobility, Decreased strength, Difficulty walking, Decreased endurance  Visit Diagnosis: Dizziness and giddiness  Difficulty in walking, not elsewhere classified  Unsteadiness on feet     Problem List Patient Active Problem List   Diagnosis Date Noted  . Palpitations 04/04/2019  . Dizziness 04/04/2019  . PSVT (paroxysmal supraventricular tachycardia) ([place]) 04/04/2019  . Near syncope 02/20/2019  . Labile hypertension 12/12/2017  . Paroxysmal atrial fibrillation ([place]) 12/12/2017  . Aortic valve disease 12/12/2017     Lady Deutscher PT, DPT [phone]  Lady Deutscher 05/15/2019, 5:13 PM  [place] MAIN [facility] SERVICES [address] [place], [place], [address] Phone: [phone]   Fax:  [phone]  Name: DANNIE WOOLEN MRN: [id number] Date of Birth: Sep 20, 1925

## 2019-05-21 ENCOUNTER — Other Ambulatory Visit: Payer: Self-pay

## 2019-05-21 ENCOUNTER — Ambulatory Visit: Payer: PPO | Admitting: Physical Therapy

## 2019-05-21 ENCOUNTER — Encounter: Payer: Self-pay | Admitting: Physical Therapy

## 2019-05-21 DIAGNOSIS — R262 Difficulty in walking, not elsewhere classified: Secondary | ICD-10-CM

## 2019-05-21 DIAGNOSIS — R42 Dizziness and giddiness: Secondary | ICD-10-CM

## 2019-05-21 DIAGNOSIS — R2681 Unsteadiness on feet: Secondary | ICD-10-CM

## 2019-05-21 NOTE — Therapy (Signed)
[place] MAIN [facility] SERVICES [address] [place], [place], [address] Phone: [phone]   Fax:  [phone]  Physical Therapy Treatment  Patient Details  Name: Jessica Kaufman MRN: [id number] Date of Birth: 1925-01-25 Referring Provider (PT): Dr. Carmin Richmond   Encounter Date: 05/21/2019  PT End of Session - 05/21/19 1059    Visit Number  13    Number of Visits  14    Date for PT Re-Evaluation  05/28/19    PT Start Time  1058    PT Stop Time  1152    PT Time Calculation (min)  54 min    Equipment Utilized During Treatment  Gait belt    Activity Tolerance  Patient tolerated treatment well    Behavior During Therapy  [facility] for tasks assessed/performed       Past Medical History:  Diagnosis Date  . Aortic valve disease   . Asthma   . Cancer ([place])    basal cell   . Gout   . Hyperlipidemia   . Hypertension   . Hypertensive heart disease without heart failure   . Hypothyroidism   . PAF (paroxysmal atrial fibrillation) ([place])   . PVC (premature ventricular contraction)   . Rheumatoid arthritis ([place])   . Ventricular premature depolarization     Past Surgical History:  Procedure Laterality Date  . BLADDER SURGERY  10/19/2010  . BONY PELVIS SURGERY  1990  . BREAST BIOPSY Left 1986  . CARDIAC SURGERY  2009   PVC  . CATARACT EXTRACTION Left 11/15/00  . CATARACT EXTRACTION Right   . ELBOW SURGERY  06/1990  . HIP SURGERY Left 05/28/09  . HIP SURGERY Right 06/30/03  . HYSTEROSCOPY  06/17/1986   vaginal    There were no vitals filed for this visit.  Subjective Assessment - 05/21/19 1059    Subjective  Patient reports she feels she did very well this past week and she states she felt she has gained some strength. Patient states her daughter brought over some 2.5# ankle weights so she tried to do some of the exercises from the clinic.    Pertinent History  Patient reports states she has had "woozy" and off balance sensations for about  2-3 years. Patient denies vertigo. Patient describes her dizziness as unsteadiness, lightheadedness and woozy. Patient uses a SPC. Patient reports she is getting dizziness several times a day depending on her activities. The symptom nature of patient's dizziness is motion provoked and intermittent. Patient reports the episodes of dizziness last minutes. Patient reports turning her head and body bring on her dizziness symptoms and lying down in the recliner and shutting her eyes makes the dizziness better. Patient reports she has saw Dr. Pryor Ochoa and she had a VNG test. 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 Patient reports she had vestibular therapy for a few months and states she felt it did help her some. Patient states states she sees double vision off and on for the past several years. Patient states some mornings she wakes up and sees double vision for a while. Patient states she has seen her eye doctor several times in regards to this issue.    Diagnostic tests  VNG test: 44% right UVH and abnormal smooth pursuits and saccades per MR    Patient Stated Goals  to have improved balance        Therapeutic Exercise:  Standing Hip exercises:  Patient performed in // bars holding with B UEs hip flexion, hip ABD and hip extension with #3ankle weights 2 sets of 20 reps with verbal cues for technique. Patient required one sitting rest break due to fatigue in legsafter second set.  Seated exercises:  In sitting with 3# ankle weights, performed 2 sets of 15 reps hip flexion and LAQ. Patient reports hip flexion in sitting is challenging and fatigues towards the end of each set.   Patient reports that she has 2.5# ankle weights for home so added standing hip abd, hip flexion and hip extension and seated hip flexion and LAQ for home exercise program via [place].    Neuromuscular Re-education:  Airex pad:   On Airex pad, patient performed feet together progressions and semi-tandem progressions with alternating lead leg with and without  horizontal and vertical head turns with CGA.       Patient progressing with lower body strengthening exercises. Patient able to perform standing and seated LE exercises with #3 ankle weights. Issued strengthening exercises for HEP with handout provided. Will plan on repeating functional outcome testing and updating goals next session as well as reviewing home exercise program as anticipate discharge next week.       PT Education - 05/21/19 1059    Education Details  Patient reports that she has 2.5# ankle weights for home so added standing hip abd, hip flexion and hip extension and seated hip flexion and LAQ for home exercise program via [place].    Person(s) Educated  Patient    Methods  Explanation;Verbal cues;Handout;Demonstration    Comprehension  Verbalized understanding;Returned demonstration       PT Short Term Goals - 04/09/19 1428      PT SHORT TERM GOAL #1   Title  Pt will be independent with HEP in order to improve balance and decrease dizziness symptoms in order to decrease fall risk and improve function at home.    Time  4    Period  Weeks    Status  Achieved    Target Date  04/02/19        PT Long Term Goals - 04/30/19 1141      PT LONG TERM GOAL #1   Title  Patient will reduce falls risk as indicated by Activities Specific Balance Confidence Scale (ABC) >67%.    Baseline  scored 43.8% on 03/05/19; scored 54.4% on 04/30/2019    Time  8    Period  Weeks    Status  Partially Met    Target Date  05/28/19      PT LONG TERM GOAL #2   Title  Patient will score 63/100 or greater on the FOTO survey to indicate improved function and activity level    Baseline  scored 53/100 on 03/05/19    Time  8    Period  Weeks    Status  On-going    Target Date  05/28/19      PT LONG TERM GOAL #3   Title  Patient will report 50% or greater  improvement in her symptoms of dizziness and imbalance with provoking motions or positions    Baseline  Patient reports 50% improvement in her symptoms since starting therapy on 04/30/2019.    Time  8    Period  Weeks    Status  Achieved    Target Date  05/28/19            Plan - 05/21/19 1120    Clinical Impression Statement  Patient progressing with lower body strengthening exercises. Patient able to perform standing and seated LE exercises with #3 ankle weights. Issued strengthening exercises for HEP with handout provided. Will plan on repeating functional outcome testing and updating goals next session as well as reviewing home exercise program as anticipate discharge next week.    Personal Factors and Comorbidities  Comorbidity 3+;Past/Current Experience;Time since onset of injury/illness/exacerbation    Comorbidities  Atrial fibrillation, macular degeneration, double vision, HTN    Examination-Activity Limitations  Other   activities with head and body turns   Stability/Clinical Decision Making  Evolving/Moderate complexity    Rehab Potential  Good    PT Frequency  1x / week    PT Duration  8 weeks    PT Treatment/Interventions  Canalith Repostioning;Gait training;Stair training;Therapeutic activities;Therapeutic exercise;Balance training;Neuromuscular re-education;Patient/family education;Vestibular    PT Next Visit Plan  consider adding feet together and semi-tandem stance standing in corner with head turns to HEP next session, Consider trying mini-squats on Airex pad and slow marching on firm and then foam pad.    PT Home Exercise Plan  Access Code: [email or web address]; Added slow marching and mini-squats standing in corner with locked walker in front or a chair for HEP and discussed safety precautions with exercises.    Consulted and Agree with Plan of Care  Patient       Patient will benefit from skilled therapeutic intervention in order to improve the following deficits and  impairments:  Decreased activity tolerance, Decreased balance, Dizziness, Decreased mobility, Decreased strength, Difficulty walking, Decreased endurance  Visit Diagnosis: Dizziness and giddiness  Difficulty in walking, not elsewhere classified  Unsteadiness on feet     Problem List Patient Active Problem List   Diagnosis Date Noted  . Palpitations 04/04/2019  . Dizziness 04/04/2019  . PSVT (paroxysmal supraventricular tachycardia) ([place]) 04/04/2019  . Near syncope 02/20/2019  . Labile hypertension 12/12/2017  . Paroxysmal atrial fibrillation ([place]) 12/12/2017  . Aortic valve disease 12/12/2017   Lady Deutscher PT, DPT [phone]  Lady Deutscher 05/22/2019, 5:15 PM  [place] MAIN [facility] [address] [place], [place], [address] Phone: [phone]   Fax:  [phone]  Name: Jessica Kaufman MRN: [id number] Date of Birth: August 01, 1925

## 2019-05-28 ENCOUNTER — Encounter: Payer: Self-pay | Admitting: Physical Therapy

## 2019-05-28 ENCOUNTER — Other Ambulatory Visit: Payer: Self-pay

## 2019-05-28 ENCOUNTER — Ambulatory Visit: Payer: PPO | Admitting: Physical Therapy

## 2019-05-28 DIAGNOSIS — R42 Dizziness and giddiness: Secondary | ICD-10-CM

## 2019-05-28 DIAGNOSIS — R262 Difficulty in walking, not elsewhere classified: Secondary | ICD-10-CM

## 2019-05-28 DIAGNOSIS — R2681 Unsteadiness on feet: Secondary | ICD-10-CM

## 2019-05-29 NOTE — Therapy (Signed)
[place] MAIN [facility] SERVICES [address] [place], [place], [address] Phone: [phone]   Fax:  [phone]  Physical Therapy Treatment/Recertification Dates of service: 03/05/2019-07/10/2019 Visit Number: 14  Patient Details  Name: Jessica Kaufman MRN: [id number] Date of Birth: [birth year] Referring Provider (PT): Dr. Carmin Richmond   Encounter Date: 05/28/2019  PT End of Session - 05/29/19 1732    Visit Number  14    Number of Visits  20    Date for PT Re-Evaluation  07/10/19    PT Start Time  1117    PT Stop Time  1200    PT Time Calculation (min)  43 min    Equipment Utilized During Treatment  Gait belt    Activity Tolerance  Patient tolerated treatment well    Behavior During Therapy  [facility] for tasks assessed/performed       Past Medical History:  Diagnosis Date  . Aortic valve disease   . Asthma   . Cancer ([place])    basal cell   . Gout   . Hyperlipidemia   . Hypertension   . Hypertensive heart disease without heart failure   . Hypothyroidism   . PAF (paroxysmal atrial fibrillation) ([place])   . PVC (premature ventricular contraction)   . Rheumatoid arthritis ([place])   . Ventricular premature depolarization     Past Surgical History:  Procedure Laterality Date  . BLADDER SURGERY  10/19/2010  . BONY PELVIS SURGERY  1990  . BREAST BIOPSY Left 1986  . CARDIAC SURGERY  2009   PVC  . CATARACT EXTRACTION Left 11/15/00  . CATARACT EXTRACTION Right   . ELBOW SURGERY  06/1990  . HIP SURGERY Left 05/28/09  . HIP SURGERY Right 06/30/03  . HYSTEROSCOPY  06/17/1986   vaginal    There were no vitals filed for this visit.  Subjective Assessment - 05/28/19 1120    Subjective  Patient reports that she has been doing her exercises everyday  but one when she had her brothers and sisters come over. Patient reprots Saturday she had dizziness but states Sunday and Monday she got along well with it in regards to her dizziness symptoms.    Pertinent History  Patient reports states she has had "woozy" and off balance sensations for about 2-3 years. Patient denies vertigo. Patient describes her dizziness as unsteadiness, lightheadedness and woozy. Patient uses a SPC. Patient reports she is getting dizziness several times a day depending on her activities. The symptom nature of patient's dizziness is motion provoked and intermittent. Patient reports the episodes of dizziness last minutes. Patient reports turning her head and body bring on her dizziness symptoms and lying down in the recliner and shutting her eyes makes the dizziness better. Patient reports she has saw Dr. Pryor Ochoa and she had a VNG test. 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 Patient reports she had vestibular therapy for a few months and states she felt it did help her some. Patient states states she sees double vision off and on for the past several years. Patient states some mornings she wakes up and sees double vision for a while. Patient states she has seen her eye doctor several times in regards to this issue.    Diagnostic tests  VNG test: 44% right UVH and abnormal smooth pursuits and saccades per MR    Patient Stated Goals  to have improved balance  Neuromuscular Re-education:   FUNCTIONAL OUTCOME MEASURES:  Results Comments  DHI 44/100 Moderate perception of handicap; in need of intervention  ABC Scale 56.9% Falls risk; in need of intervention  FOTO 53/100 Like patients nationally had a score of 64/100; in need of intervention   Discussed home exercise program and patient has no questions or concerns about home exercise program.  Discussed possible discharge from PT services, but patient reports that she would like to continue to work on trying to reduce her dizziness symptoms. Patient and her daughter state that they feel patient's balance is better, but patient  states she would like her dizziness to be improved if possible before being discharged. Discussed plan of care with patient and her daughter.   Dix-Hallpike: Performed left and right Dix-Hallpike tests and both were negative with patient denying vertigo and no nystagmus observed.  Body Wall Rolls:  Patient performed 4 reps of supported, body wall rolls with eyes open with CGA.  Patient reports increased dizziness with this activity and required short rest break.  Will plan on repeating this activity in the clinic.        PT Short Term Goals - 04/09/19 1428      PT SHORT TERM GOAL #1   Title  Pt will be independent with HEP in order to improve balance and decrease dizziness symptoms in order to decrease fall risk and improve function at home.    Time  4    Period  Weeks    Status  Achieved    Target Date  04/02/19        PT Long Term Goals - 05/28/19 1140      PT LONG TERM GOAL #1   Title  Patient will reduce falls risk as indicated by Activities Specific Balance Confidence Scale (ABC) >67%.    Baseline  scored 43.8% on 03/05/19; scored 54.4% on 04/30/2019; scored 56.9% on 05/28/2019    Time  8    Period  Weeks    Status  Partially Met      PT LONG TERM GOAL #2   Title  Patient will score 63/100 or greater on the FOTO survey to indicate improved function and activity level    Baseline  scored 53/100 on 03/05/19; scored 53/100 on 05/28/2019    Time  8    Period  Weeks    Status  Not Met      PT LONG TERM GOAL #3   Title  Patient will report 50% or greater improvement in her symptoms of dizziness and imbalance with provoking motions or positions    Baseline  Patient reports 50% improvement in her symptoms since starting therapy on 04/30/2019.; pt reports she made more improvement in her balance than her dizziness and rates it as 80%.    Time  8    Period  Weeks    Status  Achieved            Plan - 05/31/19 7209    Clinical Impression Statement  Repeated functional  outcome testing this date and patient's FOTO score remained unchanged at 53/100, but she improved from 43% to 56.9% on the ABC scale. Patient socred moderage perception of handicap on the [facility] scoring 44/100. Patient reports 50% improvement in her symptoms since starting therapy on 04/30/2019. Patient reports she made more improvement in her balance than her dizziness and rates improvements in balance as 80%. Patient reports that she would like to focus on trying to further reduce her episodes  of dizziness and states that she would like to continue PT services at this time. Patient was limitied initially with what vestibular activities she could safely perform due to decreased balance. Patient has made improvements in her balance and therefore will continue PT services focusing on dizziness/vestibular symptoms in order to try to decrease patient's symptoms. Will plan on trying body wall rolls next session, ball toss to self in // bars and walking with head turns next session.    Personal Factors and Comorbidities  Comorbidity 3+;Past/Current Experience;Time since onset of injury/illness/exacerbation    Comorbidities  Atrial fibrillation, macular degeneration, double vision, HTN    Examination-Activity Limitations  Other   activities with head and body turns   Stability/Clinical Decision Making  Evolving/Moderate complexity    Rehab Potential  Good    PT Frequency  1x / week    PT Duration  8 weeks    PT Treatment/Interventions  Canalith Repostioning;Gait training;Stair training;Therapeutic activities;Therapeutic exercise;Balance training;Neuromuscular re-education;Patient/family education;Vestibular    PT Next Visit Plan  consider adding feet together and semi-tandem stance standing in corner with head turns to HEP next session, Consider trying mini-squats on Airex pad and slow marching on firm and then foam pad.    PT Home Exercise Plan  Access Code: [email or web address]; Added slow marching and mini-squats standing  in corner with locked walker in front or a chair for HEP and discussed safety precautions with exercises.    Consulted and Agree with Plan of Care  Patient       Patient will benefit from skilled therapeutic intervention in order to improve the following deficits and impairments:  Decreased activity tolerance, Decreased balance, Dizziness, Decreased mobility, Decreased strength, Difficulty walking, Decreased endurance  Visit Diagnosis: Dizziness and giddiness  Difficulty in walking, not elsewhere classified  Unsteadiness on feet     Problem List Patient Active Problem List   Diagnosis Date Noted  . Palpitations 04/04/2019  . Dizziness 04/04/2019  . PSVT (paroxysmal supraventricular tachycardia) ([place]) 04/04/2019  . Near syncope 02/20/2019  . Labile hypertension 12/12/2017  . Paroxysmal atrial fibrillation ([place]) 12/12/2017  . Aortic valve disease 12/12/2017   Lady Deutscher PT, DPT [phone] Lady Deutscher 05/29/2019, 5:35 PM  [place] MAIN [facility] SERVICES [address] [place], [place], [address] Phone: [phone]   Fax:  [phone]  Name: Jessica Kaufman MRN: [id number] Date of Birth: [birth year]

## 2019-06-04 ENCOUNTER — Other Ambulatory Visit: Payer: Self-pay

## 2019-06-04 ENCOUNTER — Ambulatory Visit: Payer: PPO | Attending: Otolaryngology | Admitting: Physical Therapy

## 2019-06-04 ENCOUNTER — Encounter: Payer: Self-pay | Admitting: Physical Therapy

## 2019-06-04 DIAGNOSIS — R262 Difficulty in walking, not elsewhere classified: Secondary | ICD-10-CM | POA: Diagnosis not present

## 2019-06-04 DIAGNOSIS — R2681 Unsteadiness on feet: Secondary | ICD-10-CM | POA: Diagnosis not present

## 2019-06-04 DIAGNOSIS — R42 Dizziness and giddiness: Secondary | ICD-10-CM | POA: Insufficient documentation

## 2019-06-04 NOTE — Therapy (Signed)
[place] MAIN [facility] SERVICES [address] [place], [place], [address] Phone: [phone]   Fax:  [phone]  Physical Therapy Treatment  Patient Details  Name: Jessica Kaufman MRN: [id number] Date of Birth: 09/23/1925 Referring Provider (PT): Dr. Carmin Richmond   Encounter Date: 06/04/2019  PT End of Session - 06/04/19 0941    Visit Number  15    Number of Visits  20    Date for PT Re-Evaluation  07/10/19    PT Start Time  0938    PT Stop Time  1020    PT Time Calculation (min)  42 min    Equipment Utilized During Treatment  Gait belt    Activity Tolerance  Patient tolerated treatment well    Behavior During Therapy  [facility] for tasks assessed/performed       Past Medical History:  Diagnosis Date  . Aortic valve disease   . Asthma   . Cancer ([place])    basal cell   . Gout   . Hyperlipidemia   . Hypertension   . Hypertensive heart disease without heart failure   . Hypothyroidism   . PAF (paroxysmal atrial fibrillation) ([place])   . PVC (premature ventricular contraction)   . Rheumatoid arthritis ([place])   . Ventricular premature depolarization     Past Surgical History:  Procedure Laterality Date  . BLADDER SURGERY  10/19/2010  . BONY PELVIS SURGERY  1990  . BREAST BIOPSY Left 1986  . CARDIAC SURGERY  2009   PVC  . CATARACT EXTRACTION Left 11/15/00  . CATARACT EXTRACTION Right   . ELBOW SURGERY  06/1990  . HIP SURGERY Left 05/28/09  . HIP SURGERY Right 06/30/03  . HYSTEROSCOPY  06/17/1986   vaginal    There were no vitals filed for this visit.  Subjective Assessment - 06/04/19 0940    Subjective  Patient states she is doing fair and that she is a little dizzy this morning.    Pertinent History  Patient reports states she has had "woozy" and off balance sensations for about 2-3 years. Patient denies vertigo. Patient describes her dizziness as unsteadiness, lightheadedness and woozy. Patient uses a SPC. Patient reports she is getting  dizziness several times a day depending on her activities. The symptom nature of patient's dizziness is motion provoked and intermittent. Patient reports the episodes of dizziness last minutes. Patient reports turning her head and body bring on her dizziness symptoms and lying down in the recliner and shutting her eyes makes the dizziness better. Patient reports she has saw Dr. Pryor Ochoa and she had a VNG test. 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 Patient reports she had vestibular therapy for a few months and states she felt it did help her some. Patient states states she sees double vision off and on for the past several years. Patient states some mornings she wakes up and sees double vision for a while. Patient states she has seen her eye doctor several times in regards to this issue.    Diagnostic tests  VNG test: 44% right UVH and abnormal smooth pursuits and saccades per MR    Patient Stated Goals  to have improved balance       Neuromuscular Re-education:  VOR X 1 exercise:  Patient performed VOR X 1 horizontal in sitting with conflicting background 1 rep of 1 minute and in standing with conflicting background with chair placed in front  so patient could touch for balance if needed 2 reps of 1 minute each. Patient demonstrating good technique.  Patient reports 3-4/10 dizziness with this activity when in standing and no increase in dizziness when sitting.   Body Wall Rolls:  Patient performed 4 reps of supported, body wall rolls with eyes open with CGA.  Patient performed 4 reps of supported, body wall rolls with eyes closed with CGA.  Patient reports 8/10 dizziness with this activity. Patient reports this activity recreated dizziness and was challenging for her balance.   Ambulation with head turns:  Patient performed 65' trials of forwards and retro ambulation with horizontal, vertical and  diagonal head turns with CGA with use of RW.   Patient reports mild dizziness with diagonal head turns and was challenged by retro ambulation.   Diona Foley toss to self:  Patient performed static sittinging while tossing ball to self horizontal and then vertical while tracking ball with head and eyes.  Patient reports 5-6/10 dizziness with this activity.  Patient reports she has been doing seated ball follows at home and discussed adding progression of bigger and faster movements still while seated.   Ball toss over shoulder: Patient performed static standing while tossing ball over one shoulder with return catch over opposite shoulder with CGA.  Patient reports increase in dizziness to 6-7/10 with this activity.     PT Education - 06/04/19 1023    Education Details  discussed doing VOR X 1 with conflicting background in standing with counter or door frame with door closed for safety and seated ball toss to self horizontal and vertical working on increasing speed and amount of head turning excursion. Patient reports she has been doing seated ball follows at home and discussed adding progression of bigger and faster movements still while seated.   Person(s) Educated  Patient    Methods  Explanation;Verbal cues    Comprehension  Verbalized understanding       PT Short Term Goals - 04/09/19 1428      PT SHORT TERM GOAL #1   Title  Pt will be independent with HEP in order to improve balance and decrease dizziness symptoms in order to decrease fall risk and improve function at home.    Time  4    Period  Weeks    Status  Achieved    Target Date  04/02/19        PT Long Term Goals - 05/28/19 1140      PT LONG TERM GOAL #1   Title  Patient will reduce falls risk as indicated by Activities Specific Balance Confidence Scale (ABC) >67%.    Baseline  scored 43.8% on 03/05/19; scored 54.4% on 04/30/2019; scored 56.9% on 05/28/2019    Time  8    Period  Weeks    Status  Partially Met      PT  LONG TERM GOAL #2   Title  Patient will score 63/100 or greater on the FOTO survey to indicate improved function and activity level    Baseline  scored 53/100 on 03/05/19; scored 53/100 on 05/28/2019    Time  8    Period  Weeks    Status  Not Met      PT LONG TERM GOAL #3   Title  Patient will report 50% or greater improvement in her symptoms of dizziness and imbalance with provoking motions or positions    Baseline  Patient reports 50% improvement in her symptoms since starting therapy on 04/30/2019.; pt  reports she made more improvement in her balance than her dizziness and rates it as 80%.    Time  8    Period  Weeks    Status  Achieved            Plan - 06/05/19 1408    Clinical Impression Statement  Focused session on vestibular exercises this date. Patient reported that standing VOR X 1 with conflicting background brought on mild dizziness symptoms and added this progression to HEP. Patient reports incresaed dizziness and challenged with body wall rolls activity and will continue to work on progressions of this activity in the clinic. Patient also reports increased dizziness with standing ball toss to self and ball toss over shoulder activities. Will continue to work on progressions of these exercises next session in order to try to help patient further reduce her symptoms of dizziness and imbalance.    Personal Factors and Comorbidities  Comorbidity 3+;Past/Current Experience;Time since onset of injury/illness/exacerbation    Comorbidities  Atrial fibrillation, macular degeneration, double vision, HTN    Examination-Activity Limitations  Other   activities with head and body turns   Stability/Clinical Decision Making  Evolving/Moderate complexity    Rehab Potential  Good    PT Frequency  1x / week    PT Duration  8 weeks    PT Treatment/Interventions  Canalith Repostioning;Gait training;Stair training;Therapeutic activities;Therapeutic exercise;Balance training;Neuromuscular  re-education;Patient/family education;Vestibular    PT Next Visit Plan  consider adding feet together and semi-tandem stance standing in corner with head turns to HEP next session, Consider trying mini-squats on Airex pad and slow marching on firm and then foam pad.    PT Home Exercise Plan  Access Code: [email or web address]; Added slow marching and mini-squats standing in corner with locked walker in front or a chair for HEP and discussed safety precautions with exercises.    Consulted and Agree with Plan of Care  Patient       Patient will benefit from skilled therapeutic intervention in order to improve the following deficits and impairments:  Decreased activity tolerance, Decreased balance, Dizziness, Decreased mobility, Decreased strength, Difficulty walking, Decreased endurance  Visit Diagnosis: Dizziness and giddiness  Difficulty in walking, not elsewhere classified  Unsteadiness on feet     Problem List Patient Active Problem List   Diagnosis Date Noted  . Palpitations 04/04/2019  . Dizziness 04/04/2019  . PSVT (paroxysmal supraventricular tachycardia) ([place]) 04/04/2019  . Near syncope 02/20/2019  . Labile hypertension 12/12/2017  . Paroxysmal atrial fibrillation ([place]) 12/12/2017  . Aortic valve disease 12/12/2017   Lady Deutscher PT, DPT [phone] Lady Deutscher 06/05/2019, 2:10 PM  [place] MAIN [facility] [address] [place], [place], [address] Phone: [phone]   Fax:  [phone]  Name: Jessica Kaufman MRN: [id number] Date of Birth: April 23, 1925

## 2019-06-11 ENCOUNTER — Other Ambulatory Visit: Payer: Self-pay

## 2019-06-11 ENCOUNTER — Encounter: Payer: Self-pay | Admitting: Physical Therapy

## 2019-06-11 ENCOUNTER — Ambulatory Visit: Payer: PPO | Admitting: Physical Therapy

## 2019-06-11 DIAGNOSIS — R42 Dizziness and giddiness: Secondary | ICD-10-CM | POA: Diagnosis not present

## 2019-06-11 DIAGNOSIS — R2681 Unsteadiness on feet: Secondary | ICD-10-CM

## 2019-06-11 DIAGNOSIS — R262 Difficulty in walking, not elsewhere classified: Secondary | ICD-10-CM

## 2019-06-11 NOTE — Therapy (Signed)
[place] MAIN [facility] SERVICES [address] [place], [place], [address] Phone: [phone]   Fax:  [phone]  Physical Therapy Treatment  Patient Details  Name: Jessica Kaufman MRN: [id number] Date of Birth: [birth year] Referring Provider (PT): Dr. Carmin Richmond   Encounter Date: 06/11/2019  PT End of Session - 06/11/19 0954    Visit Number  16    Number of Visits  20    Date for PT Re-Evaluation  07/10/19    PT Start Time  0950    Equipment Utilized During Treatment  Gait belt    Activity Tolerance  Patient tolerated treatment well    Behavior During Therapy  [facility] for tasks assessed/performed       Past Medical History:  Diagnosis Date  . Aortic valve disease   . Asthma   . Cancer ([place])    basal cell   . Gout   . Hyperlipidemia   . Hypertension   . Hypertensive heart disease without heart failure   . Hypothyroidism   . PAF (paroxysmal atrial fibrillation) ([place])   . PVC (premature ventricular contraction)   . Rheumatoid arthritis ([place])   . Ventricular premature depolarization     Past Surgical History:  Procedure Laterality Date  . BLADDER SURGERY  10/19/2010  . BONY PELVIS SURGERY  1990  . BREAST BIOPSY Left 1986  . CARDIAC SURGERY  2009   PVC  . CATARACT EXTRACTION Left 11/15/00  . CATARACT EXTRACTION Right   . ELBOW SURGERY  06/1990  . HIP SURGERY Left 05/28/09  . HIP SURGERY Right 06/30/03  . HYSTEROSCOPY  06/17/1986   vaginal    There were no vitals filed for this visit.  Subjective Assessment - 06/11/19 0952    Subjective  Patient reports she was standing beside a golf cart and it moved and patient grabbed onto the golf cart but she did go down to the ground. Patient states she pulled a muscle in her chest/pec when she was pulling herself up.    Pertinent History  Patient reports states she has had "woozy" and off balance sensations for about 2-3 years. Patient denies vertigo. Patient describes her dizziness as  unsteadiness, lightheadedness and woozy. Patient uses a SPC. Patient reports she is getting dizziness several times a day depending on her activities. The symptom nature of patient's dizziness is motion provoked and intermittent. Patient reports the episodes of dizziness last minutes. Patient reports turning her head and body bring on her dizziness symptoms and lying down in the recliner and shutting her eyes makes the dizziness better. Patient reports she has saw Dr. Pryor Ochoa and she had a VNG test. 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 Patient reports she had vestibular therapy for a few months and states she felt it did help her some. Patient states states she sees double vision off and on for the past several years. Patient states some mornings she wakes up and sees double vision for a while. Patient states she has seen her eye doctor several times in regards to this issue.    Diagnostic tests  VNG test: 44% right UVH and abnormal smooth pursuits and saccades per MR    Patient Stated Goals  to have improved balance      Neuromuscular Re-education: Patient reports no change in her dizziness this past week. Patient states she has been doing well with her home exercise program and patient has  no questions.    VOR X 1 exercise:  Patient performed walking with RW VOR X 1 horizontal with conflicting background multiple reps of about 37' each.  Patient reports 5/10 dizziness at rest and 6/10 after walking VOR X 1 exercises.  Body Wall Rolls:  Patient performed 1 rep of eyes open and 5 reps of eyes closed of supported, body wall rolls with CGA.  Patient reports  5/10 dizziness with this activity.   Ball toss over shoulder: Patient performed static standing while tossing ball over one shoulder with return catch over opposite shoulder with CGA.  Patient performed static standing while tossing ball over one  shoulder with return catch over opposite shoulder varying the ball position to head, shoulder and waist level to promote head turning and tilting. Patient reports "wooziness" with ball toss over shoulder activity.   Ambulation with head turns:  Patient performed 65' trials of forwards and retro ambulation with horizontal and vertical head turns with CGA.   Patient reports 7-8/10 dizziness with ambulation with head turns.   Patient reports increased dizziness with ambulation with head turning activities and body wall rolls. Patient able to progress to eyes closed with body wall rolls. Patient able to progress to varying heights with ball toss over shoulder activity. Will plan on performing ambulation with head turns and ball toss over shoulder activities next session. Patient would benefit from continued PT services to try to further address dizziness symptoms.   PT Education - 06/11/19 0954    Education Details discussed HEP; exercise technique   Person(s) Educated  Patient    Methods  Explanation    Comprehension  Verbalized understanding       PT Short Term Goals - 04/09/19 1428      PT SHORT TERM GOAL #1   Title  Pt will be independent with HEP in order to improve balance and decrease dizziness symptoms in order to decrease fall risk and improve function at home.    Time  4    Period  Weeks    Status  Achieved    Target Date  04/02/19        PT Long Term Goals - 05/28/19 1140      PT LONG TERM GOAL #1   Title  Patient will reduce falls risk as indicated by Activities Specific Balance Confidence Scale (ABC) >67%.    Baseline  scored 43.8% on 03/05/19; scored 54.4% on 04/30/2019; scored 56.9% on 05/28/2019    Time  8    Period  Weeks    Status  Partially Met      PT LONG TERM GOAL #2   Title  Patient will score 63/100 or greater on the FOTO survey to indicate improved function and activity level    Baseline  scored 53/100 on 03/05/19; scored 53/100 on 05/28/2019    Time  8     Period  Weeks    Status  Not Met      PT LONG TERM GOAL #3   Title  Patient will report 50% or greater improvement in her symptoms of dizziness and imbalance with provoking motions or positions    Baseline  Patient reports 50% improvement in her symptoms since starting therapy on 04/30/2019.; pt reports she made more improvement in her balance than her dizziness and rates it as 80%.    Time  8    Period  Weeks    Status  Achieved         Plan -  06/14/19 1538    Clinical Impression Statement Patient reports increased dizziness with ambulation with head turning activities and body wall rolls. Patient able to progress to eyes closed with body wall rolls. Patient able to progress to varying heights with ball toss over shoulder activity. Will plan on performing ambulation with head turns and ball toss over shoulder activities next session. Patient would benefit from continued PT services to try to further address dizziness symptoms.    Personal Factors and Comorbidities Comorbidity 3+;Past/Current Experience;Time since onset of injury/illness/exacerbation    Comorbidities Atrial fibrillation, macular degeneration, double vision, HTN    Examination-Activity Limitations Other   activities with head and body turns   Stability/Clinical Decision Making Evolving/Moderate complexity    Rehab Potential Good    PT Frequency 1x / week    PT Duration 8 weeks    PT Treatment/Interventions Canalith Repostioning;Gait training;Stair training;Therapeutic activities;Therapeutic exercise;Balance training;Neuromuscular re-education;Patient/family education;Vestibular    PT Next Visit Plan consider adding feet together and semi-tandem stance standing in corner with head turns to HEP next session, Consider trying mini-squats on Airex pad and slow marching on firm and then foam pad.    PT Home Exercise Plan Access Code: [email or web address]; Added slow marching and mini-squats standing in corner with locked walker in front or a  chair for HEP and discussed safety precautions with exercises.    Consulted and Agree with Plan of Care Patient                Patient will benefit from skilled therapeutic intervention in order to improve the following deficits and impairments:     Visit Diagnosis: Dizziness and giddiness  Difficulty in walking, not elsewhere classified  Unsteadiness on feet     Problem List Patient Active Problem List   Diagnosis Date Noted  . Palpitations 04/04/2019  . Dizziness 04/04/2019  . PSVT (paroxysmal supraventricular tachycardia) ([place]) 04/04/2019  . Near syncope 02/20/2019  . Labile hypertension 12/12/2017  . Paroxysmal atrial fibrillation ([place]) 12/12/2017  . Aortic valve disease 12/12/2017   Lady Deutscher PT, DPT [phone] Lady Deutscher 06/11/2019, 9:55 AM  [place] MAIN [facility] SERVICES [address] [place], [place], [address] Phone: [phone]   Fax:  [phone]  Name: Jessica Kaufman MRN: [id number] Date of Birth: [birth year]

## 2019-06-18 ENCOUNTER — Ambulatory Visit: Payer: PPO | Admitting: Physical Therapy

## 2019-06-18 ENCOUNTER — Encounter: Payer: Self-pay | Admitting: Physical Therapy

## 2019-06-18 ENCOUNTER — Other Ambulatory Visit: Payer: Self-pay

## 2019-06-18 VITALS — BP 131/53 | HR 57

## 2019-06-18 DIAGNOSIS — R42 Dizziness and giddiness: Secondary | ICD-10-CM

## 2019-06-18 DIAGNOSIS — R262 Difficulty in walking, not elsewhere classified: Secondary | ICD-10-CM

## 2019-06-18 DIAGNOSIS — R2681 Unsteadiness on feet: Secondary | ICD-10-CM

## 2019-06-18 NOTE — Therapy (Signed)
[place] MAIN [facility] SERVICES [address] [place], [place], [address] Phone: [phone]   Fax:  [phone]  Physical Therapy Treatment  Patient Details  Name: Jessica Kaufman MRN: [id number] Date of Birth: 1925-01-26 Referring Provider (PT): Dr. Carmin Richmond   Encounter Date: 06/18/2019   PT End of Session - 06/18/19 0933    Visit Number 17    Number of Visits 20    Date for PT Re-Evaluation 07/10/19    PT Start Time 0932    PT Stop Time 1017    PT Time Calculation (min) 45 min    Equipment Utilized During Treatment Gait belt    Activity Tolerance Patient tolerated treatment well    Behavior During Therapy [facility] for tasks assessed/performed           Past Medical History:  Diagnosis Date  . Aortic valve disease   . Asthma   . Cancer ([place])    basal cell   . Gout   . Hyperlipidemia   . Hypertension   . Hypertensive heart disease without heart failure   . Hypothyroidism   . PAF (paroxysmal atrial fibrillation) ([place])   . PVC (premature ventricular contraction)   . Rheumatoid arthritis ([place])   . Ventricular premature depolarization     Past Surgical History:  Procedure Laterality Date  . BLADDER SURGERY  10/19/2010  . BONY PELVIS SURGERY  1990  . BREAST BIOPSY Left 1986  . CARDIAC SURGERY  2009   PVC  . CATARACT EXTRACTION Left 11/15/00  . CATARACT EXTRACTION Right   . ELBOW SURGERY  06/1990  . HIP SURGERY Left 05/28/09  . HIP SURGERY Right 06/30/03  . HYSTEROSCOPY  06/17/1986   vaginal    Vitals:   06/18/19 1353  BP: (!) 131/53  Pulse: (!) 57  SpO2: 98%     Subjective Assessment - 06/18/19 1346    Subjective Patient reports no falls this past week. Patient reports that her dizziness and balance has been about like usual this past week.    Pertinent History Patient reports states she has had "woozy" and off balance sensations for about 2-3 years. Patient denies vertigo. Patient describes her dizziness as  unsteadiness, lightheadedness and woozy. Patient uses a SPC. Patient reports she is getting dizziness several times a day depending on her activities. The symptom nature of patient's dizziness is motion provoked and intermittent. Patient reports the episodes of dizziness last minutes. Patient reports turning her head and body bring on her dizziness symptoms and lying down in the recliner and shutting her eyes makes the dizziness better. Patient reports she has saw Dr. Pryor Ochoa and she had a VNG test. 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 Patient reports she had vestibular therapy for a few months and states she felt it did help her some. Patient states states she sees double vision off and on for the past several years. Patient states some mornings she wakes up and sees double vision for a while. Patient states she has seen her eye doctor several times in regards to this issue.    Diagnostic tests VNG test: 44% right UVH and abnormal smooth pursuits and saccades per MR    Patient Stated Goals to have improved balance          Patient reports that her dizziness and balance has been about like usual this past week. Patient reports that she has not noticed  any change in frequency or intensity of her dizziness symptoms.  Patient reports that her blood pressure was a little low yesterday. Discussed hydration.  Neuromuscular Re-education:  Body Wall Rolls:  Patient performed 2 reps of supported, body wall rolls with eyes open and 4 reps with eyes closed.  Patient reports increased dizziness rated 5/10 with this activity.   Ball toss over shoulder: In // bars, patient performed static standing on firm and then repeated on Airex pad while tossing ball over one shoulder with return catch over opposite shoulder varying the ball position to head, shoulder and waist level to promote head turning and tilting with CGA  to Min A.Patient touching // bar at times for support.  Patient reports increased dizziness rated 8/10 with this activity.   Ambulation with head turns:  Patient performed 41' trials of forwards ambulation with RW with random head turns with CGA. Patient performed 46' trials of retro ambulation with RW with decreased gait speed with horizontal, vertical and diagonal head turns with CGA.   Patient reports 5/10 dizziness with retro ambulation activities.   VOR X 1 exercise:  Patient performed VOR X 1 horizontal in sitting with conflicting background 1 rep of 1 minute.  VOR x 2 exercise:  Demonstrated and educated as to VOR X 2. Patient performed VOR X 2 horiz in sitting 2 reps of 1 minute each with verbal cues for technique.  Patient reports mild dizziness with this activity rated the same as with VOR X1.  Patient reports she is using RW at home and outside. Patient ambulates to clinic with [facility] with HHA on the side not holding the cane. Discussed with patient that recommend that she continue to use RW for mobility.   Patient able to work on progressions of gaze stability exercise, VOR X 2 and ball toss over shoulder on firm and Airex pad surfaces. Patient reports 8/10 dizziness with ball toss over shoulder while on Airex pad surface and this was challenging for patient's balance. Patient reports compliance with HEP. Patient would benefit from continued PT services to further address goals and to try to decrease patient's subjective symptoms of dizziness and imbalance.     PT Education - 06/18/19 1348    Education Details exercise technique    Person(s) Educated Patient    Methods Explanation    Comprehension Verbalized understanding            PT Short Term Goals - 04/09/19 1428      PT SHORT TERM GOAL #1   Title Pt will be independent with HEP in order to improve balance and decrease dizziness symptoms in order to decrease fall risk and improve function at home.    Time 4    Period  Weeks    Status Achieved    Target Date 04/02/19             PT Long Term Goals - 05/28/19 1140      PT LONG TERM GOAL #1   Title Patient will reduce falls risk as indicated by Activities Specific Balance Confidence Scale (ABC) >67%.    Baseline scored 43.8% on 03/05/19; scored 54.4% on 04/30/2019; scored 56.9% on 05/28/2019    Time 8    Period Weeks    Status Partially Met      PT LONG TERM GOAL #2   Title Patient will score 63/100 or greater on the FOTO survey to indicate improved function and activity level    Baseline scored 53/100 on 03/05/19;  scored 53/100 on 05/28/2019    Time 8    Period Weeks    Status Not Met      PT LONG TERM GOAL #3   Title Patient will report 50% or greater improvement in her symptoms of dizziness and imbalance with provoking motions or positions    Baseline Patient reports 50% improvement in her symptoms since starting therapy on 04/30/2019.; pt reports she made more improvement in her balance than her dizziness and rates it as 80%.    Time 8    Period Weeks    Status Achieved                 Plan - 06/18/19 1348    Clinical Impression Statement Patient able to work on progressions of gaze stability exercise, VOR X 2 and ball toss over shoulder on firm and Airex pad surfaces. Patient reports 8/10 dizziness with ball toss over shoulder while on Airex pad surface and this was challenging for patient's balance. Patient reports compliance with HEP. Patient would benefit from continued PT services to further address goals and to try to decrease patient's subjective symptoms of dizziness and imbalance.    Personal Factors and Comorbidities Comorbidity 3+;Past/Current Experience;Time since onset of injury/illness/exacerbation    Comorbidities Atrial fibrillation, macular degeneration, double vision, HTN    Examination-Activity Limitations Other   activities with head and body turns   Stability/Clinical Decision Making Evolving/Moderate complexity     Rehab Potential Good    PT Frequency 1x / week    PT Duration 8 weeks    PT Treatment/Interventions Canalith Repostioning;Gait training;Stair training;Therapeutic activities;Therapeutic exercise;Balance training;Neuromuscular re-education;Patient/family education;Vestibular    PT Next Visit Plan consider adding feet together and semi-tandem stance standing in corner with head turns to HEP next session, Consider trying mini-squats on Airex pad and slow marching on firm and then foam pad.    PT Home Exercise Plan Access Code: [email or web address]; Added slow marching and mini-squats standing in corner with locked walker in front or a chair for HEP and discussed safety precautions with exercises.    Consulted and Agree with Plan of Care Patient           Patient will benefit from skilled therapeutic intervention in order to improve the following deficits and impairments:  Decreased activity tolerance, Decreased balance, Dizziness, Decreased mobility, Decreased strength, Difficulty walking, Decreased endurance  Visit Diagnosis: Dizziness and giddiness  Difficulty in walking, not elsewhere classified  Unsteadiness on feet     Problem List Patient Active Problem List   Diagnosis Date Noted  . Palpitations 04/04/2019  . Dizziness 04/04/2019  . PSVT (paroxysmal supraventricular tachycardia) ([place]) 04/04/2019  . Near syncope 02/20/2019  . Labile hypertension 12/12/2017  . Paroxysmal atrial fibrillation ([place]) 12/12/2017  . Aortic valve disease 12/12/2017   Lady Deutscher PT, DPT [phone] Lady Deutscher 06/18/2019, 2:09 PM  [place] MAIN [facility] [address] [place], [place], [address] Phone: [phone]   Fax:  [phone]  Name: WANNETTA LANGLAND MRN: [id number] Date of Birth: Sep 30, 1925

## 2019-06-25 ENCOUNTER — Ambulatory Visit: Payer: PPO | Admitting: Physical Therapy

## 2019-06-25 ENCOUNTER — Encounter: Payer: Self-pay | Admitting: Physical Therapy

## 2019-06-25 ENCOUNTER — Other Ambulatory Visit: Payer: Self-pay

## 2019-06-25 DIAGNOSIS — R42 Dizziness and giddiness: Secondary | ICD-10-CM | POA: Diagnosis not present

## 2019-06-25 DIAGNOSIS — R2681 Unsteadiness on feet: Secondary | ICD-10-CM

## 2019-06-25 DIAGNOSIS — R262 Difficulty in walking, not elsewhere classified: Secondary | ICD-10-CM

## 2019-06-25 NOTE — Therapy (Signed)
[place] MAIN [facility] SERVICES [address] [name], [place], [address] Phone: [phone]   Fax:  [phone]  Physical Therapy Treatment  Patient Details  Name: Jessica Kaufman MRN: [id number] Date of Birth: 05-14-1925 Referring Provider (PT): Dr. Carmin Richmond   Encounter Date: 06/25/2019   PT End of Session - 06/25/19 1038    Visit Number 18    Number of Visits 20    Date for PT Re-Evaluation 07/10/19    PT Start Time 1038    PT Stop Time 1120    PT Time Calculation (min) 42 min    Equipment Utilized During Treatment Gait belt    Activity Tolerance Patient tolerated treatment well    Behavior During Therapy [facility] for tasks assessed/performed           Past Medical History:  Diagnosis Date  . Aortic valve disease   . Asthma   . Cancer ([place])    basal cell   . Gout   . Hyperlipidemia   . Hypertension   . Hypertensive heart disease without heart failure   . Hypothyroidism   . PAF (paroxysmal atrial fibrillation) ([place])   . PVC (premature ventricular contraction)   . Rheumatoid arthritis ([place])   . Ventricular premature depolarization     Past Surgical History:  Procedure Laterality Date  . BLADDER SURGERY  10/19/2010  . BONY PELVIS SURGERY  1990  . BREAST BIOPSY Left 1986  . CARDIAC SURGERY  2009   PVC  . CATARACT EXTRACTION Left 11/15/00  . CATARACT EXTRACTION Right   . ELBOW SURGERY  06/1990  . HIP SURGERY Left 05/28/09  . HIP SURGERY Right 06/30/03  . HYSTEROSCOPY  06/17/1986   vaginal    There were no vitals filed for this visit.   Subjective Assessment - 06/25/19 1040    Subjective Patient reports she did well this past week except had some wooziness on Saturday and Sunday. Patient reports that she tries to do a few of the home exercises throughout the day. Patient states she might not do all of the exercises but she tries to do a little each day. Encouragement provided.    Pertinent History Patient reports states she  has had "woozy" and off balance sensations for about 2-3 years. Patient denies vertigo. Patient describes her dizziness as unsteadiness, lightheadedness and woozy. Patient uses a SPC. Patient reports she is getting dizziness several times a day depending on her activities. The symptom nature of patient's dizziness is motion provoked and intermittent. Patient reports the episodes of dizziness last minutes. Patient reports turning her head and body bring on her dizziness symptoms and lying down in the recliner and shutting her eyes makes the dizziness better. Patient reports she has saw Dr. Pryor Ochoa and she had a VNG test. 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 Patient reports she had vestibular therapy for a few months and states she felt it did help her some. Patient states states she sees double vision off and on for the past several years. Patient states some mornings she wakes up and sees double vision for a while. Patient states she has seen her eye doctor several times in regards to this issue.    Diagnostic tests VNG test: 44% right UVH and abnormal smooth pursuits and saccades per MR    Patient Stated Goals to have improved balance  Neuromuscular Re-education:  VOR x 2 exercise:  Demonstrated and educated as to VOR X 2. Patient performed VOR X 2 horiz in sitting 3 reps of 1 minute each with verbal cues for technique.  Patient reports 3-4/10 dizziness with this activity. Added to HEP.   Ambulation with head turns:  Patient performed 1' trials of forwards and retro ambulation with random, self-selected head turns with CGA.   Patient reports unsteadiness and mild wooziness with this activity.   Card Sorting Activity:  Patient performed deck of card sorting activity on mat table sorting by numbers and then turning to incorporate horizontal and vertical head turning and then to reach up and  look up to place cards on a target. Repeated this activity standing on purple foam pad. Patient required CGA; no overt losses of balance. Increased sway noted on purple foam mat.  Patient reports  3-4/10 wooziness/dizziness symptoms.  Patient reports 3-4/10 wooziness/dizziness with most activities in the clinic this date. Patient able to progress to VOR X 2 activity in sitting which was added to home exercise program. Patient did well with card sorting activity and her balance was challenged by performing while standing on purple foam pad, but was able to bend forward, return upright and turn left/right and look up and reach up without any losses of balance requiring CGA. Will plan on reviewing HEP and performing functional outcome measure retesting next session.      PT Education - 06/25/19 1253    Education Details added seated VOR X 2 to HEP    Person(s) Educated Patient    Methods Explanation;Handout;Demonstration;Verbal cues    Comprehension Verbalized understanding;Returned demonstration            PT Short Term Goals - 04/09/19 1428      PT SHORT TERM GOAL #1   Title Pt will be independent with HEP in order to improve balance and decrease dizziness symptoms in order to decrease fall risk and improve function at home.    Time 4    Period Weeks    Status Achieved    Target Date 04/02/19             PT Long Term Goals - 05/28/19 1140      PT LONG TERM GOAL #1   Title Patient will reduce falls risk as indicated by Activities Specific Balance Confidence Scale (ABC) >67%.    Baseline scored 43.8% on 03/05/19; scored 54.4% on 04/30/2019; scored 56.9% on 05/28/2019    Time 8    Period Weeks    Status Partially Met      PT LONG TERM GOAL #2   Title Patient will score 63/100 or greater on the FOTO survey to indicate improved function and activity level    Baseline scored 53/100 on 03/05/19; scored 53/100 on 05/28/2019    Time 8    Period Weeks    Status Not Met      PT LONG  TERM GOAL #3   Title Patient will report 50% or greater improvement in her symptoms of dizziness and imbalance with provoking motions or positions    Baseline Patient reports 50% improvement in her symptoms since starting therapy on 04/30/2019.; pt reports she made more improvement in her balance than her dizziness and rates it as 80%.    Time 8    Period Weeks    Status Achieved                 Plan - 06/26/19 1246  Clinical Impression Statement Patient reports 3-4/10 wooziness/dizziness with most activities in the clinic this date. Patient able to progress to VOR X 2 activity in sitting which was added to home exercise program. Patient did well with card sorting activity and her balance was challenged by performing while standing on purple foam pad, but was able to bend forward, return upright and turn left/right and look up and reach up without any losses of balance requiring CGA. Will plan on reviewing HEP, performing functional outcome measure retesting and discussing possible discharge from PT next session.    Personal Factors and Comorbidities Comorbidity 3+;Past/Current Experience;Time since onset of injury/illness/exacerbation    Comorbidities Atrial fibrillation, macular degeneration, double vision, HTN    Examination-Activity Limitations Other   activities with head and body turns   Stability/Clinical Decision Making Evolving/Moderate complexity    Rehab Potential Good    PT Frequency 1x / week    PT Duration 8 weeks    PT Treatment/Interventions Canalith Repostioning;Gait training;Stair training;Therapeutic activities;Therapeutic exercise;Balance training;Neuromuscular re-education;Patient/family education;Vestibular    PT Next Visit Plan consider adding feet together and semi-tandem stance standing in corner with head turns to HEP next session, Consider trying mini-squats on Airex pad and slow marching on firm and then foam pad.    PT Home Exercise Plan Access Code: [email or web address];  Added slow marching and mini-squats standing in corner with locked walker in front or a chair for HEP and discussed safety precautions with exercises.    Consulted and Agree with Plan of Care Patient           Patient will benefit from skilled therapeutic intervention in order to improve the following deficits and impairments:  Decreased activity tolerance, Decreased balance, Dizziness, Decreased mobility, Decreased strength, Difficulty walking, Decreased endurance  Visit Diagnosis: Dizziness and giddiness  Difficulty in walking, not elsewhere classified  Unsteadiness on feet     Problem List Patient Active Problem List   Diagnosis Date Noted  . Palpitations 04/04/2019  . Dizziness 04/04/2019  . PSVT (paroxysmal supraventricular tachycardia) ([place]) 04/04/2019  . Near syncope 02/20/2019  . Labile hypertension 12/12/2017  . Paroxysmal atrial fibrillation ([place]) 12/12/2017  . Aortic valve disease 12/12/2017   Lady Deutscher PT, DPT [phone] Lady Deutscher 06/26/2019, 12:46 PM  [place] MAIN [facility] SERVICES [address] [place], [place], [address] Phone: [phone]   Fax:  [phone]  Name: Jessica Kaufman MRN: [id number] Date of Birth: 1925/01/17

## 2019-07-02 ENCOUNTER — Other Ambulatory Visit: Payer: Self-pay

## 2019-07-02 ENCOUNTER — Ambulatory Visit: Payer: PPO | Admitting: Physical Therapy

## 2019-07-02 ENCOUNTER — Encounter: Payer: Self-pay | Admitting: Physical Therapy

## 2019-07-02 DIAGNOSIS — R2681 Unsteadiness on feet: Secondary | ICD-10-CM

## 2019-07-02 DIAGNOSIS — R42 Dizziness and giddiness: Secondary | ICD-10-CM

## 2019-07-02 DIAGNOSIS — R262 Difficulty in walking, not elsewhere classified: Secondary | ICD-10-CM

## 2019-07-03 NOTE — Therapy (Signed)
[place] MAIN [facility] SERVICES [address] [place], [place], [address] Phone: [phone]   Fax:  [phone]  Physical Therapy Treatment/Discharge Summary Dates of Service: 03/05/2019-07/02/2019 Total Number of Visits: 19  Patient Details  Name: Jessica Kaufman MRN: [id number] Date of Birth: October 06, 1925 Referring Provider (PT): Dr. Carmin Richmond   Encounter Date: 07/02/2019   PT End of Session - 07/02/19 1010    Visit Number 19    Number of Visits 20    Date for PT Re-Evaluation 07/10/19    PT Start Time 1007   patient arrived late for appt therefore shorter session   PT Stop Time 1037    PT Time Calculation (min) 30 min    Equipment Utilized During Treatment Gait belt    Activity Tolerance Patient tolerated treatment well    Behavior During Therapy [facility] for tasks assessed/performed           Past Medical History:  Diagnosis Date  . Aortic valve disease   . Asthma   . Cancer ([place])    basal cell   . Gout   . Hyperlipidemia   . Hypertension   . Hypertensive heart disease without heart failure   . Hypothyroidism   . PAF (paroxysmal atrial fibrillation) ([place])   . PVC (premature ventricular contraction)   . Rheumatoid arthritis ([place])   . Ventricular premature depolarization     Past Surgical History:  Procedure Laterality Date  . BLADDER SURGERY  10/19/2010  . BONY PELVIS SURGERY  1990  . BREAST BIOPSY Left 1986  . CARDIAC SURGERY  2009   PVC  . CATARACT EXTRACTION Left 11/15/00  . CATARACT EXTRACTION Right   . ELBOW SURGERY  06/1990  . HIP SURGERY Left 05/28/09  . HIP SURGERY Right 06/30/03  . HYSTEROSCOPY  06/17/1986   vaginal    There were no vitals filed for this visit.    Subjective Assessment - 07/03/19 1242    Subjective Patient reports that continues to have some days better and worse than others in regards to dizziness symptoms. Patient states she has no questions or concerns in regards to her home exercise program.     Pertinent History Patient reports states she has had "woozy" and off balance sensations for about 2-3 years. Patient denies vertigo. Patient describes her dizziness as unsteadiness, lightheadedness and woozy. Patient uses a SPC. Patient reports she is getting dizziness several times a day depending on her activities. The symptom nature of patient's dizziness is motion provoked and intermittent. Patient reports the episodes of dizziness last minutes. Patient reports turning her head and body bring on her dizziness symptoms and lying down in the recliner and shutting her eyes makes the dizziness better. Patient reports she has saw Dr. Pryor Ochoa and she had a VNG test. 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 Patient reports she had vestibular therapy for a few months and states she felt it did help her some. Patient states states she sees double vision off and on for the past several years. Patient states some mornings she wakes up and sees double vision for a while. Patient states she has seen her eye doctor several times in regards to this issue.    Diagnostic tests VNG test: 44% right UVH and abnormal smooth pursuits and saccades per MR    Patient Stated Goals to have improved balance  Neuromuscular Re-education:  Reviewed home exercise program and discussed plans for discharge. Patient plans on continuing to perform about 15 minutes a day of HEP exercises. Patient has no questions or concerns about home exercise program.   FUNCTIONAL OUTCOME MEASURES:  Results Comments  DHI 32/100 Low perception of handicap  ABC Scale 45% Falls risk; in need of intervention  FOTO 56/100 in need of intervention   Body Wall Rolls:  Patient performed 2 reps of supported, body wall rolls using SPC with eyes open and 2 reps with eyes closed with CGA.   Patient reports no increase in dizziness with this activity.    Ambulation with head turns:  Patient performed 175' trials of forwards and retro ambulation with horizontal and vertical head turns with CGA. Patient used RW with these activities.    Patient with slow cadence but no loss of balance and mild symptoms.      PT Education - 07/03/19 1248    Education Details discussed home exercise program, functional outcome measure retesting and compared to prior testing and discharge plans    Person(s) Educated Patient    Methods Explanation    Comprehension Verbalized understanding            PT Short Term Goals - 04/09/19 1428      PT SHORT TERM GOAL #1   Title Pt will be independent with HEP in order to improve balance and decrease dizziness symptoms in order to decrease fall risk and improve function at home.    Time 4    Period Weeks    Status Achieved    Target Date 04/02/19             PT Long Term Goals - 07/03/19 1239      PT LONG TERM GOAL #1   Title Patient will reduce falls risk as indicated by Activities Specific Balance Confidence Scale (ABC) >67%.    Baseline scored 43.8% on 03/05/19; scored 54.4% on 04/30/2019; scored 56.9% on 05/28/2019; scored 45% on 07/02/2019    Time 8    Period Weeks    Status Partially Met      PT LONG TERM GOAL #2   Title Patient will score 63/100 or greater on the FOTO survey to indicate improved function and activity level    Baseline scored 53/100 on 03/05/19; scored 53/100 on 05/28/2019; scored 56/100 on 07/02/2019    Time 8    Period Weeks    Status Partially Met      PT LONG TERM GOAL #3   Title Patient will report 50% or greater improvement in her symptoms of dizziness and imbalance with provoking motions or positions    Baseline Patient reports 50% improvement in her symptoms since starting therapy on 04/30/2019.; pt reports she made more improvement in her balance than her dizziness and rates it as 80%. On 07/02/2019, patient reported 60% improvement in dizziness symptoms and 85% improvement in  balance symptoms since starting therapy    Time 8    Period Weeks    Status Achieved             Plan - 07/04/19 1533    Clinical Impression Statement Patient reports 85% improvement in her balance since starting vestibular therapy and reports 60% improvement in her symptoms of dizziness. Patient improved from 53/100 to 56/100 on the FOTO survey  and 42% to 45% on the ABC scale. Patient scored 32/100 on the [facility] indicating low perception of handicap. Patient has met 1/1 short  term goals and 1/3 long term goals. Patient partially met 2/3 long term goals for improvements in ABC scale and FOTO scores. Patient is independent with home exercise program and she plans to continue to perform daily upon discharge. Patient is in agreement with discharge from PT services at this time. Will discharge from PT services at this time.    Personal Factors and Comorbidities Comorbidity 3+;Past/Current Experience;Time since onset of injury/illness/exacerbation    Comorbidities Atrial fibrillation, macular degeneration, double vision, HTN    Examination-Activity Limitations Other   activities with head and body turns   Stability/Clinical Decision Making Evolving/Moderate complexity    Rehab Potential Good    PT Frequency 1x / week    PT Duration 8 weeks    PT Treatment/Interventions Canalith Repostioning;Gait training;Stair training;Therapeutic activities;Therapeutic exercise;Balance training;Neuromuscular re-education;Patient/family education;Vestibular    PT Next Visit Plan consider adding feet together and semi-tandem stance standing in corner with head turns to HEP next session, Consider trying mini-squats on Airex pad and slow marching on firm and then foam pad.    PT Home Exercise Plan Access Code: [email or web address]; Added slow marching and mini-squats standing in corner with locked walker in front or a chair for HEP and discussed safety precautions with exercises.    Consulted and Agree with Plan of Care Patient             Patient will benefit from skilled therapeutic intervention in order to improve the following deficits and impairments:  Decreased activity tolerance, Decreased balance, Dizziness, Decreased mobility, Decreased strength, Difficulty walking, Decreased endurance  Visit Diagnosis: Dizziness and giddiness  Difficulty in walking, not elsewhere classified  Unsteadiness on feet     Problem List Patient Active Problem List   Diagnosis Date Noted  . Palpitations 04/04/2019  . Dizziness 04/04/2019  . PSVT (paroxysmal supraventricular tachycardia) ([place]) 04/04/2019  . Near syncope 02/20/2019  . Labile hypertension 12/12/2017  . Paroxysmal atrial fibrillation ([place]) 12/12/2017  . Aortic valve disease 12/12/2017   Lady Deutscher PT, DPT [phone] Lady Deutscher 07/03/2019, 12:51 PM  [place] MAIN [facility] SERVICES [address] [place], [place], [address] Phone: [phone]   Fax:  [phone]  Name: DYNA FIGUEREO MRN: [id number] Date of Birth: 08/26/1925

## 2019-07-15 DIAGNOSIS — M1990 Unspecified osteoarthritis, unspecified site: Secondary | ICD-10-CM | POA: Diagnosis not present

## 2019-07-15 DIAGNOSIS — M79643 Pain in unspecified hand: Secondary | ICD-10-CM | POA: Diagnosis not present

## 2019-07-15 DIAGNOSIS — M0589 Other rheumatoid arthritis with rheumatoid factor of multiple sites: Secondary | ICD-10-CM | POA: Diagnosis not present

## 2019-07-15 DIAGNOSIS — Z79899 Other long term (current) drug therapy: Secondary | ICD-10-CM | POA: Diagnosis not present

## 2019-07-15 DIAGNOSIS — M6530 Trigger finger, unspecified finger: Secondary | ICD-10-CM | POA: Diagnosis not present

## 2019-07-15 DIAGNOSIS — E669 Obesity, unspecified: Secondary | ICD-10-CM | POA: Diagnosis not present

## 2019-08-12 DIAGNOSIS — Z6833 Body mass index (BMI) 33.0-33.9, adult: Secondary | ICD-10-CM | POA: Diagnosis not present

## 2019-08-12 DIAGNOSIS — Z1331 Encounter for screening for depression: Secondary | ICD-10-CM | POA: Diagnosis not present

## 2019-08-12 DIAGNOSIS — E785 Hyperlipidemia, unspecified: Secondary | ICD-10-CM | POA: Diagnosis not present

## 2019-08-12 DIAGNOSIS — Z9181 History of falling: Secondary | ICD-10-CM | POA: Diagnosis not present

## 2019-08-12 DIAGNOSIS — Z139 Encounter for screening, unspecified: Secondary | ICD-10-CM | POA: Diagnosis not present

## 2019-08-12 DIAGNOSIS — Z0000 Encounter for general adult medical examination without abnormal findings: Secondary | ICD-10-CM | POA: Diagnosis not present

## 2019-08-28 DIAGNOSIS — G4733 Obstructive sleep apnea (adult) (pediatric): Secondary | ICD-10-CM | POA: Diagnosis not present

## 2019-09-12 DIAGNOSIS — L821 Other seborrheic keratosis: Secondary | ICD-10-CM | POA: Diagnosis not present

## 2019-09-12 DIAGNOSIS — L570 Actinic keratosis: Secondary | ICD-10-CM | POA: Diagnosis not present

## 2019-09-12 DIAGNOSIS — D485 Neoplasm of uncertain behavior of skin: Secondary | ICD-10-CM | POA: Diagnosis not present

## 2019-09-13 ENCOUNTER — Other Ambulatory Visit: Payer: Self-pay | Admitting: *Deleted

## 2019-09-13 MED ORDER — LOSARTAN POTASSIUM 50 MG PO TABS: 50.0000 mg | ORAL_TABLET | Freq: Two times a day (BID) | ORAL | 0 refills | Status: DC

## 2019-09-13 NOTE — Telephone Encounter (Signed)
Requested Prescriptions   Signed Prescriptions Disp Refills  . losartan (COZAAR) 50 MG tablet 180 tablet 0    Sig: Take 1 tablet (50 mg total) by mouth 2 (two) times daily.    Authorizing Provider: END, CHRISTOPHER    Ordering User: Britt Bottom

## 2019-09-23 ENCOUNTER — Other Ambulatory Visit: Payer: Self-pay | Admitting: Internal Medicine

## 2019-10-02 ENCOUNTER — Other Ambulatory Visit: Payer: Self-pay

## 2019-10-02 ENCOUNTER — Encounter: Payer: Self-pay | Admitting: Internal Medicine

## 2019-10-02 ENCOUNTER — Ambulatory Visit (INDEPENDENT_AMBULATORY_CARE_PROVIDER_SITE_OTHER): Payer: PPO | Admitting: Internal Medicine

## 2019-10-02 VITALS — BP 138/84 | HR 55 | Ht 64.0 in | Wt 207.1 lb

## 2019-10-02 DIAGNOSIS — I480 Paroxysmal atrial fibrillation: Secondary | ICD-10-CM

## 2019-10-02 DIAGNOSIS — I471 Supraventricular tachycardia: Secondary | ICD-10-CM | POA: Diagnosis not present

## 2019-10-02 DIAGNOSIS — R0989 Other specified symptoms and signs involving the circulatory and respiratory systems: Secondary | ICD-10-CM | POA: Diagnosis not present

## 2019-10-02 DIAGNOSIS — I350 Nonrheumatic aortic (valve) stenosis: Secondary | ICD-10-CM | POA: Diagnosis not present

## 2019-10-02 DIAGNOSIS — R0789 Other chest pain: Secondary | ICD-10-CM | POA: Diagnosis not present

## 2019-10-02 NOTE — Progress Notes (Signed)
Follow-up Outpatient Visit Date: 10/02/2019  Primary Care Provider: Leonides Sake, [place] [address]  Chief Complaint: Follow-up hypertension  HPI:  Ms. Jessica Kaufman is a 86 y.o. female with history of labile hypertension, paroxysmal atrial fibrillation, aortic stenosis (mild to moderate by echocardiogram in 03/2019), PVCs, hyperlipidemia, and rheumatoid arthritis, who presents for follow-up of hypertension.  I last saw Ms. Jessica Kaufman in late March, at which time she was feeling a bit better with less dizziness.  She was participating in vestibular rehab.  Preceding event monitor showed episodes of PSVT and junctional rhythm, not clearly associated with dizziness.  Today, Ms. Hakimian reports that she has been feeling relatively well.  She notes a few episodes of chest pain, most commonly when she is at rest.  She had one episode that lasted about an hour.  She wonders if it could be related to acid reflux.  Home blood pressures have generally been well controlled though somewhat labile.  Highest systolic blood pressures have been in the 150-160 range, though most readings lie between 115 and 150 mmHg.  Ms. Slappey denies significant shortness of breath, palpitations, and edema.  Dizziness has improved with vestibular rehab.  --------------------------------------------------------------------------------------------------  Past Medical History:  Diagnosis Date  . Aortic valve disease   . Asthma   . Cancer ([place])    basal cell   . Gout   . Hyperlipidemia   . Hypertension   . Hypertensive heart disease without heart failure   . Hypothyroidism   . PAF (paroxysmal atrial fibrillation) ([place])   . PVC (premature ventricular contraction)   . Rheumatoid arthritis ([place])   . Ventricular premature depolarization    Past Surgical History:  Procedure Laterality Date  . BLADDER SURGERY  10/19/2010  . BONY PELVIS SURGERY  1990  . BREAST BIOPSY Left 1986  . CARDIAC  SURGERY  2009   PVC  . CATARACT EXTRACTION Left 11/15/00  . CATARACT EXTRACTION Right   . ELBOW SURGERY  06/1990  . HIP SURGERY Left 05/28/09  . HIP SURGERY Right 06/30/03  . HYSTEROSCOPY  06/17/1986   vaginal    Current Meds  Medication Sig  . acetaminophen (TYLENOL ARTHRITIS PAIN) 650 MG CR tablet Take 650 mg by mouth every 8 (eight) hours as needed for pain.  Marland Kitchen allopurinol (ZYLOPRIM) 300 MG tablet Take 1 tablet by mouth every other day.   Marland Kitchen amoxicillin (AMOXIL) 500 MG tablet Take 2,000 mg by mouth 2 (two) times daily.  Marland Kitchen apixaban (ELIQUIS) 5 MG TABS tablet Take 5 mg by mouth 2 (two) times daily.   Marland Kitchen b complex vitamins tablet Take 1 tablet by mouth daily.  . Bilberry, Vaccinium myrtillus, 1000 MG CAPS Take 2 capsules by mouth daily.  Marland Kitchen Bioflavonoid Products (BIOFLEX) TABS Take 2 tablets by mouth daily.  . Calcium Carb-Cholecalciferol (OYSTER SHELL CALCIUM/VITAMIN D PO) daily.   . Cholecalciferol (VITAMIN D3) 1000 UNITS CAPS Take 1 capsule by mouth daily.  . Coenzyme Q10 (COQ10) 100 MG CAPS Take 1 capsule by mouth daily.  Marland Kitchen CRANBERRY PO Take 1 tablet by mouth daily.  . folic acid (FOLVITE) 1 MG tablet Take by mouth daily.   Marland Kitchen gabapentin (NEURONTIN) 300 MG capsule Take 300 mg by mouth 2 (two) times daily.   . Ginger, Zingiber officinalis, (GINGER ROOT PO) Take by mouth daily.  Marland Kitchen levothyroxine (SYNTHROID, LEVOTHROID) 50 MCG tablet Take 1 tablet by mouth daily.  Marland Kitchen losartan (COZAAR) 50 MG tablet Take 1 tablet (50 mg  total) by mouth 2 (two) times daily.  . methotrexate (RHEUMATREX) 2.5 MG tablet Take by mouth. Takes 7 tablets weekly.  . metoprolol succinate (TOPROL-XL) 25 MG 24 hr tablet Take 1 tablet by mouth at bedtime  . Multiple Vitamins-Minerals (CENTRUM SILVER PO) Take 1 tablet by mouth daily.  . Multiple Vitamins-Minerals (PRESERVISION AREDS 2 PO) Take 1 capsule by mouth 2 (two) times daily.   . Omega-3 Fatty Acids (FISH OIL) 1000 MG CAPS Take 2 capsules by mouth daily.  Marland Kitchen  oxybutynin (DITROPAN-XL) 10 MG 24 hr tablet Take 10 mg by mouth daily.  Marland Kitchen PREMARIN vaginal cream Place 1 Applicatorful vaginally in the morning and at bedtime.   . psyllium (METAMUCIL) 58.6 % powder Take 1 packet by mouth daily.  . TURMERIC PO Take by mouth daily.    Allergies: Metronidazole, Sulindac, and Naproxen  Social History   Tobacco Use  . Smoking status: Never Smoker  . Smokeless tobacco: Never Used  Vaping Use  . Vaping Use: Never used  Substance Use Topics  . Alcohol use: No  . Drug use: No    Family History  Problem Relation Age of Onset  . Stroke Mother   . Melanoma Mother   . Heart attack Father   . Uterine cancer Sister   . Melanoma Sister   . Heart failure Sister   . Heart attack Maternal Grandmother   . Breast cancer Daughter     Review of Systems: A 12-system review of systems was performed and was negative except as noted in the HPI.  --------------------------------------------------------------------------------------------------  Physical Exam: BP 138/84 (BP Location: Right Arm, Patient Position: Sitting, Cuff Size: Large)   Pulse (!) 55   Ht 5\' 4"  (1.626 m)   Wt 207 lb 2 oz (94 kg)   SpO2 98%   BMI 35.55 kg/m   General: NAD.  Accompanied by her daughter. Neck: No JVD or HJR. Lungs: Clear to auscultation without wheezes or crackles. Heart: Bradycardic but regular with 2/6 systolic murmur.  No rubs or gallops. Abdomen: Soft, nontender, nondistended. Extremities: No lower extremity edema.  EKG: Sinus bradycardia with inferolateral ST/T changes.  Heart rate has decreased since 04/03/2019.  Otherwise, there has been no significant interval change.  Lab Results  Component Value Date   WBC 7.2 10/02/2019   HGB 13.3 10/02/2019   HCT 37.7 10/02/2019   MCV 95 10/02/2019   PLT 289 10/02/2019    Lab Results  Component Value Date   NA 138 10/02/2019   K 4.2 10/02/2019   CL 101 10/02/2019   CO2 21 10/02/2019   BUN 13 10/02/2019    CREATININE 0.63 10/02/2019   GLUCOSE 92 10/02/2019   ALT 21 10/02/2019    No results found for: CHOL, HDL, LDLCALC, LDLDIRECT, TRIG, CHOLHDL  --------------------------------------------------------------------------------------------------  ASSESSMENT AND PLAN: Atypical chest pain: Chest pain is atypical and most consistent with GERD.  However, underlying coronary insufficiency cannot be excluded, particularly given ST/T changes on EKG.  However, these have been stable for several years.  We discussed further ischemia evaluation but have agreed to defer this.  If pain were to become more frequent/severe, we will have a low threshold for noninvasive ischemia testing.  I encouraged Ms. Jessica Kaufman to consider use of an old over-the-counter antacid.  Hypertension: Blood pressure remains somewhat labile but overall reasonably well controlled.  We will defer medication changes today.  I will check a CMP today to ensure appropriate renal function and electrolytes.  Aortic stenosis: Moderate AAS noted  by echo in March.  No symptoms reported.  Continue clinical follow-up.  PSVT: Incidentally noted on event monitor earlier this year.  No significant palpitations reported today.  Given resting bradycardia and occasional junctional rhythm noted on monitor, we will defer escalation of metoprolol.  Paroxysmal atrial fibrillation: EKG today shows sinus bradycardia.  No symptoms reported.  Continue anticoagulation with apixaban as well as current dose of metoprolol.  I will check a CMP and CBC today to ensure that it is appropriate to continue current dose of apixaban, as well as a TSH in the setting of bradycardia.  Follow-up: Return to clinic in 6 months.  Nelva Bush, MD 10/03/2019 10:44 AM

## 2019-10-02 NOTE — Patient Instructions (Signed)
Medication Instructions:  Your physician recommends that you continue on your current medications as directed. Please refer to the Current Medication list given to you today.  *If you need a refill on your cardiac medications before your next appointment, please call your pharmacy*  Lab Work: Your physician recommends that you return for lab work in: TODAY - CBC, CMET, TSH.  If you have labs (blood work) drawn today and your tests are completely normal, you will receive your results only by: Marland Kitchen MyChart Message (if you have MyChart) OR . A paper copy in the mail If you have any lab test that is abnormal or we need to change your treatment, we will call you to review the results.   Testing/Procedures: none  Follow-Up: At [facility], you and your health needs are our priority.  As part of our continuing mission to provide you with exceptional heart care, we have created designated Provider Care Teams.  These Care Teams include your primary Cardiologist (physician) and Advanced Practice Providers (APPs -  Physician Assistants and Nurse Practitioners) who all work together to provide you with the care you need, when you need it.  We recommend signing up for the patient portal called "MyChart".  Sign up information is provided on this After Visit Summary.  MyChart is used to connect with patients for Virtual Visits (Telemedicine).  Patients are able to view lab/test results, encounter notes, upcoming appointments, etc.  Non-urgent messages can be sent to your provider as well.   To learn more about what you can do with MyChart, go to [email or web address].    Your next appointment:   6 month(s)  The format for your next appointment:   In Person  Provider:   You may see Nelva Bush, MD or one of the following Advanced Practice Providers on your designated Care Team:    Murray Hodgkins, NP  Christell Faith, PA-C  Marrianne Mood, PA-C  Cadence [place], [place]

## 2019-10-03 ENCOUNTER — Encounter: Payer: Self-pay | Admitting: Internal Medicine

## 2019-10-03 DIAGNOSIS — I350 Nonrheumatic aortic (valve) stenosis: Secondary | ICD-10-CM | POA: Insufficient documentation

## 2019-10-03 DIAGNOSIS — R0789 Other chest pain: Secondary | ICD-10-CM | POA: Insufficient documentation

## 2019-10-03 LAB — COMPREHENSIVE METABOLIC PANEL
ALT: 21 IU/L (ref 0–32)
AST: 33 IU/L (ref 0–40)
Albumin/Globulin Ratio: 1.6 (ref 1.2–2.2)
Albumin: 4 g/dL (ref 3.5–4.6)
Alkaline Phosphatase: 84 IU/L (ref 44–121)
BUN/Creatinine Ratio: 21 (ref 12–28)
BUN: 13 mg/dL (ref 10–36)
Bilirubin Total: 0.4 mg/dL (ref 0.0–1.2)
CO2: 21 mmol/L (ref 20–29)
Calcium: 9.7 mg/dL (ref 8.7–10.3)
Chloride: 101 mmol/L (ref 96–106)
Creatinine, Ser: 0.63 mg/dL (ref 0.57–1.00)
GFR calc Af Amer: 90 mL/min/{1.73_m2} (ref >59)
GFR calc non Af Amer: 78 mL/min/{1.73_m2} (ref >59)
Globulin, Total: 2.5 g/dL (ref 1.5–4.5)
Glucose: 92 mg/dL (ref 65–99)
Potassium: 4.2 mmol/L (ref 3.5–5.2)
Sodium: 138 mmol/L (ref 134–144)
Total Protein: 6.5 g/dL (ref 6.0–8.5)

## 2019-10-03 LAB — TSH: TSH: 1.65 u[IU]/mL (ref 0.450–4.500)

## 2019-10-03 LAB — CBC
Hematocrit: 37.7 % (ref 34.0–46.6)
Hemoglobin: 13.3 g/dL (ref 11.1–15.9)
MCH: 33.6 pg — ABNORMAL HIGH (ref 26.6–33.0)
MCHC: 35.3 g/dL (ref 31.5–35.7)
MCV: 95 fL (ref 79–97)
Platelets: 289 10*3/uL (ref 150–450)
RBC: 3.96 x10E6/uL (ref 3.77–5.28)
RDW: 13.6 % (ref 11.7–15.4)
WBC: 7.2 10*3/uL (ref 3.4–10.8)

## 2019-10-09 DIAGNOSIS — C44529 Squamous cell carcinoma of skin of other part of trunk: Secondary | ICD-10-CM | POA: Diagnosis not present

## 2019-11-01 DIAGNOSIS — E669 Obesity, unspecified: Secondary | ICD-10-CM | POA: Diagnosis not present

## 2019-11-01 DIAGNOSIS — M79643 Pain in unspecified hand: Secondary | ICD-10-CM | POA: Diagnosis not present

## 2019-11-01 DIAGNOSIS — G629 Polyneuropathy, unspecified: Secondary | ICD-10-CM | POA: Diagnosis not present

## 2019-11-01 DIAGNOSIS — M0589 Other rheumatoid arthritis with rheumatoid factor of multiple sites: Secondary | ICD-10-CM | POA: Diagnosis not present

## 2019-11-01 DIAGNOSIS — M1990 Unspecified osteoarthritis, unspecified site: Secondary | ICD-10-CM | POA: Diagnosis not present

## 2019-11-01 DIAGNOSIS — Z79899 Other long term (current) drug therapy: Secondary | ICD-10-CM | POA: Diagnosis not present

## 2019-11-01 DIAGNOSIS — M6530 Trigger finger, unspecified finger: Secondary | ICD-10-CM | POA: Diagnosis not present

## 2019-11-11 DIAGNOSIS — Z6835 Body mass index (BMI) 35.0-35.9, adult: Secondary | ICD-10-CM | POA: Diagnosis not present

## 2019-11-11 DIAGNOSIS — E7800 Pure hypercholesterolemia, unspecified: Secondary | ICD-10-CM | POA: Diagnosis not present

## 2019-11-11 DIAGNOSIS — R0989 Other specified symptoms and signs involving the circulatory and respiratory systems: Secondary | ICD-10-CM | POA: Diagnosis not present

## 2019-11-11 DIAGNOSIS — I480 Paroxysmal atrial fibrillation: Secondary | ICD-10-CM | POA: Diagnosis not present

## 2019-11-11 DIAGNOSIS — G629 Polyneuropathy, unspecified: Secondary | ICD-10-CM | POA: Diagnosis not present

## 2019-11-11 DIAGNOSIS — M109 Gout, unspecified: Secondary | ICD-10-CM | POA: Diagnosis not present

## 2019-11-11 DIAGNOSIS — Z23 Encounter for immunization: Secondary | ICD-10-CM | POA: Diagnosis not present

## 2019-11-11 DIAGNOSIS — E039 Hypothyroidism, unspecified: Secondary | ICD-10-CM | POA: Diagnosis not present

## 2019-11-11 DIAGNOSIS — M069 Rheumatoid arthritis, unspecified: Secondary | ICD-10-CM | POA: Diagnosis not present

## 2019-12-03 ENCOUNTER — Other Ambulatory Visit: Payer: Self-pay

## 2019-12-03 DIAGNOSIS — J019 Acute sinusitis, unspecified: Secondary | ICD-10-CM | POA: Diagnosis not present

## 2019-12-03 DIAGNOSIS — Z20822 Contact with and (suspected) exposure to covid-19: Secondary | ICD-10-CM | POA: Diagnosis not present

## 2019-12-03 MED ORDER — METOPROLOL SUCCINATE ER 25 MG PO TB24: 25.0000 mg | ORAL_TABLET | Freq: Every day | ORAL | 0 refills | Status: DC

## 2019-12-03 MED ORDER — HYDROCHLOROTHIAZIDE 25 MG PO TABS: 25.0000 mg | ORAL_TABLET | Freq: Every day | ORAL | 0 refills | Status: DC

## 2019-12-19 DIAGNOSIS — R059 Cough, unspecified: Secondary | ICD-10-CM | POA: Diagnosis not present

## 2020-01-09 ENCOUNTER — Emergency Department
Admission: EM | Admit: 2020-01-09 | Discharge: 2020-01-09 | Disposition: A | Discharge status: Home / Self Care | Payer: PPO | Attending: Emergency Medicine | Admitting: Emergency Medicine

## 2020-01-09 ENCOUNTER — Other Ambulatory Visit: Payer: Self-pay

## 2020-01-09 ENCOUNTER — Emergency Department: Payer: PPO

## 2020-01-09 ENCOUNTER — Encounter: Payer: Self-pay | Admitting: Emergency Medicine

## 2020-01-09 DIAGNOSIS — Z7901 Long term (current) use of anticoagulants: Secondary | ICD-10-CM | POA: Diagnosis not present

## 2020-01-09 DIAGNOSIS — S0990XA Unspecified injury of head, initial encounter: Secondary | ICD-10-CM | POA: Insufficient documentation

## 2020-01-09 DIAGNOSIS — Z85828 Personal history of other malignant neoplasm of skin: Secondary | ICD-10-CM | POA: Insufficient documentation

## 2020-01-09 DIAGNOSIS — R42 Dizziness and giddiness: Secondary | ICD-10-CM | POA: Diagnosis not present

## 2020-01-09 DIAGNOSIS — Y92000 Kitchen of unspecified non-institutional (private) residence as  the place of occurrence of the external cause: Secondary | ICD-10-CM | POA: Diagnosis not present

## 2020-01-09 DIAGNOSIS — E039 Hypothyroidism, unspecified: Secondary | ICD-10-CM | POA: Diagnosis not present

## 2020-01-09 DIAGNOSIS — Z043 Encounter for examination and observation following other accident: Secondary | ICD-10-CM | POA: Diagnosis not present

## 2020-01-09 DIAGNOSIS — W19XXXA Unspecified fall, initial encounter: Secondary | ICD-10-CM

## 2020-01-09 DIAGNOSIS — Z79899 Other long term (current) drug therapy: Secondary | ICD-10-CM | POA: Diagnosis not present

## 2020-01-09 DIAGNOSIS — I119 Hypertensive heart disease without heart failure: Secondary | ICD-10-CM | POA: Insufficient documentation

## 2020-01-09 DIAGNOSIS — W01198A Fall on same level from slipping, tripping and stumbling with subsequent striking against other object, initial encounter: Secondary | ICD-10-CM | POA: Diagnosis not present

## 2020-01-09 DIAGNOSIS — Y9389 Activity, other specified: Secondary | ICD-10-CM | POA: Diagnosis not present

## 2020-01-09 LAB — BASIC METABOLIC PANEL
Anion gap: 10 (ref 5–15)
BUN: 13 mg/dL (ref 8–23)
CO2: 28 mmol/L (ref 22–32)
Calcium: 9.2 mg/dL (ref 8.9–10.3)
Chloride: 99 mmol/L (ref 98–111)
Creatinine, Ser: 0.62 mg/dL (ref 0.44–1.00)
GFR, Estimated: >60 mL/min (ref >60)
Glucose, Bld: 101 mg/dL — ABNORMAL HIGH (ref 70–99)
Potassium: 4 mmol/L (ref 3.5–5.1)
Sodium: 137 mmol/L (ref 135–145)

## 2020-01-09 LAB — CBC WITH DIFFERENTIAL/PLATELET
Abs Immature Granulocytes: 0.03 10*3/uL (ref 0.00–0.07)
Basophils Absolute: 0.1 10*3/uL (ref 0.0–0.1)
Basophils Relative: 1 %
Eosinophils Absolute: 0.3 10*3/uL (ref 0.0–0.5)
Eosinophils Relative: 3 %
HCT: 38.9 % (ref 36.0–46.0)
Hemoglobin: 13 g/dL (ref 12.0–15.0)
Immature Granulocytes: 0 %
Lymphocytes Relative: 21 %
Lymphs Abs: 1.5 10*3/uL (ref 0.7–4.0)
MCH: 33.6 pg (ref 26.0–34.0)
MCHC: 33.4 g/dL (ref 30.0–36.0)
MCV: 100.5 fL — ABNORMAL HIGH (ref 80.0–100.0)
Monocytes Absolute: 0.6 10*3/uL (ref 0.1–1.0)
Monocytes Relative: 9 %
Neutro Abs: 4.8 10*3/uL (ref 1.7–7.7)
Neutrophils Relative %: 66 %
Platelets: 293 10*3/uL (ref 150–400)
RBC: 3.87 MIL/uL (ref 3.87–5.11)
RDW: 14.4 % (ref 11.5–15.5)
WBC: 7.3 10*3/uL (ref 4.0–10.5)
nRBC: 0 % (ref 0.0–0.2)

## 2020-01-09 LAB — TROPONIN I (HIGH SENSITIVITY): Troponin I (High Sensitivity): 15 ng/L (ref <18)

## 2020-01-09 NOTE — Discharge Instructions (Addendum)
Please seek medical attention for any high fevers, chest pain, shortness of breath, change in behavior, persistent vomiting, bloody stool or any other new or concerning symptoms.  

## 2020-01-09 NOTE — ED Triage Notes (Signed)
Pt to ED via EMS with c/o fall. Pt states this morning at approx 0745 was standing at the counter fixing her breakfast and she fell backwards and hit her head. Pt denies LOC at this time. Pt states is currently on blood thinners. Pt currently A&O x4.

## 2020-01-09 NOTE — ED Provider Notes (Signed)
[facility] Emergency Department Provider Note   ____________________________________________   I have reviewed the triage vital signs and the nursing notes.   HISTORY  Chief Complaint Fall   History limited by: Not Limited   HPI LUCCIANA Kaufman is a 87 y.o. female who presents to the emergency department today because of concerns for a fall.  The patient states she was in her kitchen this morning when she fell backwards.  She hit her buttocks and then the back of her head.  She did not lose consciousness.  She stated that her head felt somewhat full after this although no significant pain.  Patient denies any sense of dizziness prior to the fall.  She states she has been told she has vestibular issues.  She denies any chest pain or palpitations prior to the fall.  Denies any recent illness. Denies any issues with urination.   Records reviewed. Per medical record review patient has a history of HLD, HTN, CA.   Past Medical History:  Diagnosis Date  . Aortic valve disease   . Asthma   . Cancer ([place])    basal cell   . Gout   . Hyperlipidemia   . Hypertension   . Hypertensive heart disease without heart failure   . Hypothyroidism   . PAF (paroxysmal atrial fibrillation) ([place])   . PVC (premature ventricular contraction)   . Rheumatoid arthritis ([place])   . Ventricular premature depolarization     Patient Active Problem List   Diagnosis Date Noted  . Atypical chest pain 10/03/2019  . Aortic valve stenosis 10/03/2019  . Palpitations 04/04/2019  . Dizziness 04/04/2019  . PSVT (paroxysmal supraventricular tachycardia) ([place]) 04/04/2019  . Near syncope 02/20/2019  . Labile hypertension 12/12/2017  . Paroxysmal atrial fibrillation ([place]) 12/12/2017  . Aortic valve disease 12/12/2017    Past Surgical History:  Procedure Laterality Date  . BLADDER SURGERY  10/19/2010  . BONY PELVIS SURGERY  1990  . BREAST BIOPSY Left 1986  . CARDIAC SURGERY  2009    PVC  . CATARACT EXTRACTION Left 11/15/00  . CATARACT EXTRACTION Right   . ELBOW SURGERY  06/1990  . HIP SURGERY Left 05/28/09  . HIP SURGERY Right 06/30/03  . HYSTEROSCOPY  06/17/1986   vaginal    Prior to Admission medications   Medication Sig Start Date End Date Taking? Authorizing Provider  acetaminophen (TYLENOL ARTHRITIS PAIN) 650 MG CR tablet Take 650 mg by mouth every 8 (eight) hours as needed for pain.    [provider]  allopurinol (ZYLOPRIM) 300 MG tablet Take 1 tablet by mouth every other day.  03/23/13   [provider]  amoxicillin (AMOXIL) 500 MG tablet Take 2,000 mg by mouth 2 (two) times daily. 09/21/18   [provider]  apixaban (ELIQUIS) 5 MG TABS tablet Take 5 mg by mouth 2 (two) times daily.     [provider]  b complex vitamins tablet Take 1 tablet by mouth daily.    [provider]  Bilberry, Vaccinium myrtillus, 1000 MG CAPS Take 2 capsules by mouth daily.    [provider]  Bioflavonoid Products (BIOFLEX) TABS Take 2 tablets by mouth daily.    [provider]  Calcium Carb-Cholecalciferol (OYSTER SHELL CALCIUM/VITAMIN D PO) daily.  12/03/13   [provider]  Cholecalciferol (VITAMIN D3) 1000 UNITS CAPS Take 1 capsule by mouth daily.    [provider]  Coenzyme Q10 (COQ10) 100 MG CAPS Take 1 capsule by  mouth daily.    [provider]  CRANBERRY PO Take 1 tablet by mouth daily.    [provider]  fexofenadine (ALLEGRA) 180 MG tablet Take 180 mg by mouth daily. Patient not taking: Reported on 10/02/2019    [provider]  folic acid (FOLVITE) 1 MG tablet Take by mouth daily.     [provider]  gabapentin (NEURONTIN) 300 MG capsule Take 300 mg by mouth 2 (two) times daily.  10/17/17   [provider]  Ginger, Zingiber officinalis, (GINGER ROOT PO) Take by mouth daily.    [provider]  hydrochlorothiazide (HYDRODIURIL) 25 MG  tablet Take 1 tablet (25 mg total) by mouth daily. 12/03/19 03/02/20  End, Cristal Deer, MD  levothyroxine (SYNTHROID, LEVOTHROID) 50 MCG tablet Take 1 tablet by mouth daily. 06/10/13   [provider]  losartan (COZAAR) 50 MG tablet Take 1 tablet (50 mg total) by mouth 2 (two) times daily. 09/13/19 03/11/20  End, Cristal Deer, MD  methotrexate (RHEUMATREX) 2.5 MG tablet Take by mouth. Takes 7 tablets weekly. 10/09/17   [provider]  metoprolol succinate (TOPROL-XL) 25 MG 24 hr tablet Take 1 tablet (25 mg total) by mouth at bedtime. 12/03/19   End, Cristal Deer, MD  Multiple Vitamins-Minerals (CENTRUM SILVER PO) Take 1 tablet by mouth daily.    [provider]  Multiple Vitamins-Minerals (PRESERVISION AREDS 2 PO) Take 1 capsule by mouth 2 (two) times daily.     [provider]  Omega-3 Fatty Acids (FISH OIL) 1000 MG CAPS Take 2 capsules by mouth daily.    [provider]  oxybutynin (DITROPAN-XL) 10 MG 24 hr tablet Take 10 mg by mouth daily. 03/24/17   [provider]  PREMARIN vaginal cream Place 1 Applicatorful vaginally in the morning and at bedtime.  10/29/18   [provider]  psyllium (METAMUCIL) 58.6 % powder Take 1 packet by mouth daily.    [provider]  TURMERIC PO Take by mouth daily.    [provider]    Allergies Metronidazole, Sulindac, and Naproxen  Family History  Problem Relation Age of Onset  . Stroke Mother   . Melanoma Mother   . Heart attack Father   . Uterine cancer Sister   . Melanoma Sister   . Heart failure Sister   . Heart attack Maternal Grandmother   . Breast cancer Daughter     Social History Social History   Tobacco Use  . Smoking status: Never Smoker  . Smokeless tobacco: Never Used  Vaping Use  . Vaping Use: Never used  Substance Use Topics  . Alcohol use: No  . Drug use: No    Review of Systems Constitutional: No fever/chills Eyes: No visual changes. ENT: No sore  throat. Cardiovascular: Denies chest pain. Respiratory: Denies shortness of breath. Gastrointestinal: No abdominal pain.  No nausea, no vomiting.  No diarrhea.   Genitourinary: Negative for dysuria. Musculoskeletal: Negative for back pain. Skin: Negative for rash. Neurological: Negative for headaches, focal weakness or numbness.  ____________________________________________   PHYSICAL EXAM:  VITAL SIGNS: ED Triage Vitals [01/09/20 1646]  Enc Vitals Group     BP (!) 187/86     Pulse Rate 68     Resp 20     Temp 98.8 F (37.1 C)     Temp Source Oral     SpO2 100 %     Weight 207 lb 3.7 oz (94 kg)     Height 5\' 4"  (1.626 m)  Constitutional: Alert and oriented.  Eyes: Conjunctivae are normal.  ENT      Head: Normocephalic and atraumatic.      Nose: No congestion/rhinnorhea.      Mouth/Throat: Mucous membranes are moist.      Neck: No stridor. Hematological/Lymphatic/Immunilogical: No cervical lymphadenopathy. Cardiovascular: Normal rate, regular rhythm.  No murmurs, rubs, or gallops.  Respiratory: Normal respiratory effort without tachypnea nor retractions. Breath sounds are clear and equal bilaterally. No wheezes/rales/rhonchi. Gastrointestinal: Soft and non tender. No rebound. No guarding.  Genitourinary: Deferred Musculoskeletal: Normal range of motion in all extremities. No lower extremity edema. Neurologic:  Normal speech and language. No gross focal neurologic deficits are appreciated.  Skin:  Skin is warm, dry and intact. No rash noted. Psychiatric: Mood and affect are normal. Speech and behavior are normal. Patient exhibits appropriate insight and judgment.  ____________________________________________    LABS (pertinent positives/negatives)  Trop hs 15 BMP wnl except glu 105 CBC wbc 7.3, hgb 13.0, plt 293  ____________________________________________   EKG  I, Nance Pear, attending physician, personally viewed and interpreted this EKG  EKG Time:  2019 Rate: 64 Rhythm: normal sinus rhythm Axis: normal Intervals: qtc 458 QRS: narrow ST changes: no st elevation, t wave inversion III Impression: abnormal ekg   ____________________________________________    RADIOLOGY  CT head/cervical spine No acute traumatic injury  ____________________________________________   PROCEDURES  Procedures  ____________________________________________   INITIAL IMPRESSION / ASSESSMENT AND PLAN / ED COURSE  Pertinent labs & imaging results that were available during my care of the patient were reviewed by me and considered in my medical decision making (see chart for details).   Patient presented to the emergency department today because of a fall. She was in her kitchen. Did hit her head. CT head and cervical spine were negative for acute traumatic injury. Did check blood work which did not show any anemia or electrolyte abnormality. Will plan on discharging home.   ____________________________________________   FINAL CLINICAL IMPRESSION(S) / ED DIAGNOSES  Final diagnoses:  Fall, initial encounter     Note: This dictation was prepared with Dragon dictation. Any transcriptional errors that result from this process are unintentional     Nance Pear, MD 01/09/20 2119

## 2020-01-21 DIAGNOSIS — R32 Unspecified urinary incontinence: Secondary | ICD-10-CM | POA: Diagnosis not present

## 2020-01-21 DIAGNOSIS — I480 Paroxysmal atrial fibrillation: Secondary | ICD-10-CM | POA: Diagnosis not present

## 2020-01-21 DIAGNOSIS — Z87440 Personal history of urinary (tract) infections: Secondary | ICD-10-CM | POA: Diagnosis not present

## 2020-01-21 DIAGNOSIS — E559 Vitamin D deficiency, unspecified: Secondary | ICD-10-CM | POA: Diagnosis not present

## 2020-01-21 DIAGNOSIS — E039 Hypothyroidism, unspecified: Secondary | ICD-10-CM | POA: Diagnosis not present

## 2020-01-21 DIAGNOSIS — I219 Acute myocardial infarction, unspecified: Secondary | ICD-10-CM | POA: Diagnosis not present

## 2020-01-21 DIAGNOSIS — M109 Gout, unspecified: Secondary | ICD-10-CM | POA: Diagnosis not present

## 2020-01-21 DIAGNOSIS — M069 Rheumatoid arthritis, unspecified: Secondary | ICD-10-CM | POA: Diagnosis not present

## 2020-01-21 DIAGNOSIS — K219 Gastro-esophageal reflux disease without esophagitis: Secondary | ICD-10-CM | POA: Diagnosis not present

## 2020-01-21 DIAGNOSIS — Z8673 Personal history of transient ischemic attack (TIA), and cerebral infarction without residual deficits: Secondary | ICD-10-CM | POA: Diagnosis not present

## 2020-01-21 DIAGNOSIS — Z7901 Long term (current) use of anticoagulants: Secondary | ICD-10-CM | POA: Diagnosis not present

## 2020-01-21 DIAGNOSIS — N182 Chronic kidney disease, stage 2 (mild): Secondary | ICD-10-CM | POA: Diagnosis not present

## 2020-01-21 DIAGNOSIS — G629 Polyneuropathy, unspecified: Secondary | ICD-10-CM | POA: Diagnosis not present

## 2020-01-21 DIAGNOSIS — Z6835 Body mass index (BMI) 35.0-35.9, adult: Secondary | ICD-10-CM | POA: Diagnosis not present

## 2020-01-21 DIAGNOSIS — M810 Age-related osteoporosis without current pathological fracture: Secondary | ICD-10-CM | POA: Diagnosis not present

## 2020-01-23 ENCOUNTER — Telehealth: Payer: Self-pay

## 2020-01-23 ENCOUNTER — Telehealth: Payer: Self-pay | Admitting: Internal Medicine

## 2020-01-23 NOTE — Telephone Encounter (Signed)
I agree with recommendation to speak with PCP to determine if further evaluation for injury from fall is needed.  Ok to f/u with [place] as scheduled in early February.  If patient has recurrent falls or demonstrates any other signs/symptoms of injury from today's fall, she should call 911.  Nelva Bush, MD [facility]

## 2020-01-23 NOTE — Telephone Encounter (Signed)
Spoke with patient's daughter Jessica Kaufman, ok per DPR. States patient fell this morning and is ok. BP before breakfast and getting up was 87/74, HR 74. Patient proceeded to get up and start making her breakfast. After a few minutes of standing, she felt weak and lost her balance and fell. BP after all and before the EMT left was 119/80. Patient denies SOB (any worse than usual), chest pain, dizziness, headache or any other symptoms at this time. She is alert and oriented as usual.  Daughter is concerned about her BP running low. Recent readings  1/19 139/95, 61  AM prior to meds   143/94, 62  PM  1/14 114/83, 60  AM prior to meds  1/12 126/84, 56  AM prior to meds  1/07 107/71  1st check   122/76   2nd check of the morning, prior to meds.  Daughter feels patient is staying hydrated. They prefer not to come in today unless Dr End feels necessary and will wait until 02/05/20 appt. Advised Dr End would most likely advise them to call PCP about the fall. Jessica Kaufman states someone will be with her around the clock and if any cognitive symptoms appear, they will take her to the emergency room.   Routing to Dr End to review and further advice.

## 2020-01-23 NOTE — Telephone Encounter (Signed)
Duplicate encounter     Closing this one.

## 2020-01-23 NOTE — Telephone Encounter (Signed)
Pt c/o BP issue: STAT if pt c/o blurred vision, one-sided weakness or slurred speech  1. What are your last 5 BP readings? 87/74,109/80-EMT came out and took this one  2. Are you having any other symptoms (ex. Dizziness, headache, blurred vision, passed out)? Recently fell  3. What is your BP issue? low

## 2020-01-23 NOTE — Telephone Encounter (Signed)
Patients daughter calling.  She would like patient to see Dr. Saunders Revel - preferred.  Daughter stated that patient fell this morning and her BP has been dropping.  Offered today at 140 or 4pm - she stated she would speak with her sister and make sure someone could bring her.  She did go ahead and schedule for 02/05/2020  She stated she will call back after speaking with sister and she might take the appt today after arrangements have been made.

## 2020-01-23 NOTE — Telephone Encounter (Signed)
Spoke to [employer]. She verbalized understanding of Dr Darnelle Bos recommendations. She and her sister have spent time with patient today, instructing her on taking her BP and moving around slowly. They are thinking the BP cuff might be wrong so they are going to take manual at times as well. She will let us know if any other concerns arise.

## 2020-01-27 DIAGNOSIS — L820 Inflamed seborrheic keratosis: Secondary | ICD-10-CM | POA: Diagnosis not present

## 2020-01-27 DIAGNOSIS — L570 Actinic keratosis: Secondary | ICD-10-CM | POA: Diagnosis not present

## 2020-01-27 DIAGNOSIS — C44529 Squamous cell carcinoma of skin of other part of trunk: Secondary | ICD-10-CM | POA: Diagnosis not present

## 2020-01-27 DIAGNOSIS — D485 Neoplasm of uncertain behavior of skin: Secondary | ICD-10-CM | POA: Diagnosis not present

## 2020-02-04 DIAGNOSIS — G629 Polyneuropathy, unspecified: Secondary | ICD-10-CM | POA: Diagnosis not present

## 2020-02-04 DIAGNOSIS — M810 Age-related osteoporosis without current pathological fracture: Secondary | ICD-10-CM | POA: Diagnosis not present

## 2020-02-04 DIAGNOSIS — E559 Vitamin D deficiency, unspecified: Secondary | ICD-10-CM | POA: Diagnosis not present

## 2020-02-04 DIAGNOSIS — K219 Gastro-esophageal reflux disease without esophagitis: Secondary | ICD-10-CM | POA: Diagnosis not present

## 2020-02-04 DIAGNOSIS — I219 Acute myocardial infarction, unspecified: Secondary | ICD-10-CM | POA: Diagnosis not present

## 2020-02-04 DIAGNOSIS — I480 Paroxysmal atrial fibrillation: Secondary | ICD-10-CM | POA: Diagnosis not present

## 2020-02-04 DIAGNOSIS — N182 Chronic kidney disease, stage 2 (mild): Secondary | ICD-10-CM | POA: Diagnosis not present

## 2020-02-04 DIAGNOSIS — Z87440 Personal history of urinary (tract) infections: Secondary | ICD-10-CM | POA: Diagnosis not present

## 2020-02-04 DIAGNOSIS — R32 Unspecified urinary incontinence: Secondary | ICD-10-CM | POA: Diagnosis not present

## 2020-02-04 DIAGNOSIS — Z6835 Body mass index (BMI) 35.0-35.9, adult: Secondary | ICD-10-CM | POA: Diagnosis not present

## 2020-02-04 DIAGNOSIS — Z7901 Long term (current) use of anticoagulants: Secondary | ICD-10-CM | POA: Diagnosis not present

## 2020-02-04 DIAGNOSIS — M109 Gout, unspecified: Secondary | ICD-10-CM | POA: Diagnosis not present

## 2020-02-04 DIAGNOSIS — E039 Hypothyroidism, unspecified: Secondary | ICD-10-CM | POA: Diagnosis not present

## 2020-02-04 DIAGNOSIS — Z8673 Personal history of transient ischemic attack (TIA), and cerebral infarction without residual deficits: Secondary | ICD-10-CM | POA: Diagnosis not present

## 2020-02-04 DIAGNOSIS — M069 Rheumatoid arthritis, unspecified: Secondary | ICD-10-CM | POA: Diagnosis not present

## 2020-02-05 ENCOUNTER — Encounter: Payer: Self-pay | Admitting: Internal Medicine

## 2020-02-05 ENCOUNTER — Ambulatory Visit: Payer: PPO | Admitting: Internal Medicine

## 2020-02-05 ENCOUNTER — Other Ambulatory Visit: Payer: Self-pay

## 2020-02-05 VITALS — BP 152/80 | HR 56 | Ht 64.0 in | Wt 210.0 lb

## 2020-02-05 DIAGNOSIS — I471 Supraventricular tachycardia: Secondary | ICD-10-CM | POA: Diagnosis not present

## 2020-02-05 DIAGNOSIS — I480 Paroxysmal atrial fibrillation: Secondary | ICD-10-CM | POA: Diagnosis not present

## 2020-02-05 DIAGNOSIS — R0989 Other specified symptoms and signs involving the circulatory and respiratory systems: Secondary | ICD-10-CM | POA: Diagnosis not present

## 2020-02-05 DIAGNOSIS — I350 Nonrheumatic aortic (valve) stenosis: Secondary | ICD-10-CM

## 2020-02-05 NOTE — Patient Instructions (Signed)

## 2020-02-05 NOTE — Progress Notes (Signed)
Follow-up Outpatient Visit Date: 02/05/2020  Primary Care Provider: Leonides Sake, MD [place] [address]  Chief Complaint: Falls and lightheadedness  HPI:  Jessica Kaufman is a 87 y.o. female with history of labile hypertension, paroxysmal atrial fibrillation, aortic stenosis (mild to moderate by echocardiogram in 03/2019), PVCs, hyperlipidemia, and rheumatoid arthritis, who presents for follow-up of hypertension and recent fall.  I last saw Ms. Zigmund Daniel in late September, at which time she reported a few episodes of chest pain thought to be due to acid reflux.  Blood pressures remained somewhat labile but near her baseline.  We did not make any medication changes.  Today, Ms. Pelzel is feeling fairly well.  She reports to falls over the last month.  The first occurred when she lost her balance and fell backwards.  She struck her head but did not pass out.  She was evaluated in the ED without evidence of significant trauma.  She had another episode more recently during which she felt lightheaded and almost passed out.  Her blood pressure on her cuff was 87/74.  She did not pass out completely and was able to contact her daughter who summoned EMS.  They found her blood pressure to be low normal with gradual rise over a few minutes.  She was not evaluated in the ED.  Ms. Chatel notes that her blood pressures remain somewhat labile.  She continues to adjust the timing of her losartan dose around noon.  She has not had any chest pain, shortness of breath, or palpitations.  --------------------------------------------------------------------------------------------------  Past Medical History:  Diagnosis Date  . Aortic valve disease   . Asthma   . Cancer ([place])    basal cell   . Gout   . Hyperlipidemia   . Hypertension   . Hypertensive heart disease without heart failure   . Hypothyroidism   . PAF (paroxysmal atrial fibrillation) ([place])   . PVC (premature  ventricular contraction)   . Rheumatoid arthritis ([place])   . Ventricular premature depolarization    Past Surgical History:  Procedure Laterality Date  . BLADDER SURGERY  10/19/2010  . BONY PELVIS SURGERY  1990  . BREAST BIOPSY Left 1986  . CARDIAC SURGERY  2009   PVC  . CATARACT EXTRACTION Left 11/15/00  . CATARACT EXTRACTION Right   . ELBOW SURGERY  06/1990  . HIP SURGERY Left 05/28/09  . HIP SURGERY Right 06/30/03  . HYSTEROSCOPY  06/17/1986   vaginal    Current Meds  Medication Sig  . acetaminophen (TYLENOL) 650 MG CR tablet Take 650 mg by mouth every 8 (eight) hours as needed for pain.  Marland Kitchen allopurinol (ZYLOPRIM) 300 MG tablet Take 1 tablet by mouth every other day.   Marland Kitchen apixaban (ELIQUIS) 5 MG TABS tablet Take 5 mg by mouth 2 (two) times daily.   Marland Kitchen b complex vitamins tablet Take 1 tablet by mouth daily.  . Bilberry, Vaccinium myrtillus, 1000 MG CAPS Take 2 capsules by mouth daily.  Marland Kitchen Bioflavonoid Products (BIOFLEX) TABS Take 2 tablets by mouth daily.  . Calcium Carb-Cholecalciferol (OYSTER SHELL CALCIUM/VITAMIN D PO) daily.   . Cholecalciferol (VITAMIN D3) 1000 UNITS CAPS Take 1 capsule by mouth daily.  . Coenzyme Q10 (COQ10) 100 MG CAPS Take 1 capsule by mouth daily.  Marland Kitchen CRANBERRY PO Take 1 tablet by mouth daily.  . fexofenadine (ALLEGRA) 180 MG tablet Take 180 mg by mouth daily.  . folic acid (FOLVITE) 1 MG tablet Take by mouth daily.   Marland Kitchen  gabapentin (NEURONTIN) 600 MG tablet Take 600 mg by mouth 2 (two) times daily.  . Ginger, Zingiber officinalis, (GINGER ROOT PO) Take by mouth daily.  . hydrochlorothiazide (HYDRODIURIL) 25 MG tablet Take 1 tablet (25 mg total) by mouth daily.  Marland Kitchen levothyroxine (SYNTHROID, LEVOTHROID) 50 MCG tablet Take 1 tablet by mouth daily.  Marland Kitchen losartan (COZAAR) 50 MG tablet Take 1 tablet (50 mg total) by mouth 2 (two) times daily.  . methotrexate (RHEUMATREX) 2.5 MG tablet Take by mouth. Takes 7 tablets weekly.  . metoprolol succinate (TOPROL-XL) 25 MG 24  hr tablet Take 1 tablet (25 mg total) by mouth at bedtime.  . Multiple Vitamins-Minerals (CENTRUM SILVER PO) Take 1 tablet by mouth daily.  . Multiple Vitamins-Minerals (PRESERVISION AREDS 2 PO) Take 1 capsule by mouth 2 (two) times daily.   . Omega-3 Fatty Acids (FISH OIL) 1000 MG CAPS Take 2 capsules by mouth daily.  Marland Kitchen oxybutynin (DITROPAN-XL) 10 MG 24 hr tablet Take 10 mg by mouth daily.  . predniSONE (DELTASONE) 5 MG tablet Take 5 mg by mouth daily with breakfast.  . PREMARIN vaginal cream Place 1 Applicatorful vaginally in the morning and at bedtime.   . psyllium (METAMUCIL) 58.6 % powder Take 1 packet by mouth daily.  . TURMERIC PO Take by mouth daily.    Allergies: Metronidazole, Sulindac, and Naproxen  Social History   Tobacco Use  . Smoking status: Never Smoker  . Smokeless tobacco: Never Used  Vaping Use  . Vaping Use: Never used  Substance Use Topics  . Alcohol use: No  . Drug use: No    Family History  Problem Relation Age of Onset  . Stroke Mother   . Melanoma Mother   . Heart attack Father   . Uterine cancer Sister   . Melanoma Sister   . Heart failure Sister   . Heart attack Maternal Grandmother   . Breast cancer Daughter     Review of Systems: A 12-system review of systems was performed and was negative except as noted in the HPI.  --------------------------------------------------------------------------------------------------  Physical Exam: BP (!) 152/80 (BP Location: Left Arm, Patient Position: Sitting, Cuff Size: Normal)   Pulse (!) 56   Ht 5\' 4"  (1.626 m)   Wt 210 lb (95.3 kg)   SpO2 98%   BMI 36.05 kg/m   General:  NAD. Neck: No JVD or HJR. Lungs: Clear to auscultation bilaterally without wheezes or crackles. Heart: Bradycardic but regular with 2/6 systolic murmur.  No rubs or gallops. Abdomen: Soft, nontender, nondistended. Extremities: No lower extremity edema.  EKG: Normal sinus with LVH and nonspecific ST abnormality.  No  significant change from prior tracing on 01/09/2020.  Lab Results  Component Value Date   WBC 7.3 01/09/2020   HGB 13.0 01/09/2020   HCT 38.9 01/09/2020   MCV 100.5 (H) 01/09/2020   PLT 293 01/09/2020    Lab Results  Component Value Date   NA 137 01/09/2020   K 4.0 01/09/2020   CL 99 01/09/2020   CO2 28 01/09/2020   BUN 13 01/09/2020   CREATININE 0.62 01/09/2020   GLUCOSE 101 (H) 01/09/2020   ALT 21 10/02/2019    No results found for: CHOL, HDL, LDLCALC, LDLDIRECT, TRIG, CHOLHDL  --------------------------------------------------------------------------------------------------  ASSESSMENT AND PLAN: Labile hypertension: This has been a longstanding issue for Ms. Hagberg with a few recent episodes of low blood pressure including 1 that led to near syncope.  We have discussed the importance of staying well-hydrated.  I  encouraged her to sit/lie down if she becomes lightheadedness so as to prevent falls and loss of consciousness.  We will continue her current medication regimen.  Importance of sodium restriction was reinforced.  Aortic stenosis: Noted to be mild to moderate on most recent echo in 03/2019.  I doubt that this alone explains transient hypotension and lightheadedness.  We will continue clinical follow-up and consider repeating echo in 6 to 12 months.  PSVT: Incidentally noted on event monitor last year.  No symptoms reported by Ms. Zigmund Daniel.  Paroxysmal atrial fibrillation: Ms. Vanacker denies palpitations and maintains sinus bradycardia on EKG today.  Event monitor last year showed no evidence of atrial fibrillation.  We will continue current doses of metoprolol and apixaban.  Follow-up: Return to clinic in 6 months.  Nelva Bush, MD 02/05/2020 11:01 AM

## 2020-02-11 DIAGNOSIS — C44629 Squamous cell carcinoma of skin of left upper limb, including shoulder: Secondary | ICD-10-CM | POA: Diagnosis not present

## 2020-02-24 DIAGNOSIS — R059 Cough, unspecified: Secondary | ICD-10-CM | POA: Diagnosis not present

## 2020-02-26 DIAGNOSIS — M0589 Other rheumatoid arthritis with rheumatoid factor of multiple sites: Secondary | ICD-10-CM | POA: Diagnosis not present

## 2020-02-26 DIAGNOSIS — M542 Cervicalgia: Secondary | ICD-10-CM | POA: Diagnosis not present

## 2020-02-26 DIAGNOSIS — G629 Polyneuropathy, unspecified: Secondary | ICD-10-CM | POA: Diagnosis not present

## 2020-02-26 DIAGNOSIS — M1990 Unspecified osteoarthritis, unspecified site: Secondary | ICD-10-CM | POA: Diagnosis not present

## 2020-02-26 DIAGNOSIS — Z79899 Other long term (current) drug therapy: Secondary | ICD-10-CM | POA: Diagnosis not present

## 2020-02-26 DIAGNOSIS — E669 Obesity, unspecified: Secondary | ICD-10-CM | POA: Diagnosis not present

## 2020-03-13 DIAGNOSIS — N182 Chronic kidney disease, stage 2 (mild): Secondary | ICD-10-CM | POA: Diagnosis not present

## 2020-03-13 DIAGNOSIS — M810 Age-related osteoporosis without current pathological fracture: Secondary | ICD-10-CM | POA: Diagnosis not present

## 2020-03-13 DIAGNOSIS — I480 Paroxysmal atrial fibrillation: Secondary | ICD-10-CM | POA: Diagnosis not present

## 2020-03-13 DIAGNOSIS — G629 Polyneuropathy, unspecified: Secondary | ICD-10-CM | POA: Diagnosis not present

## 2020-03-13 DIAGNOSIS — K219 Gastro-esophageal reflux disease without esophagitis: Secondary | ICD-10-CM | POA: Diagnosis not present

## 2020-03-13 DIAGNOSIS — Z8673 Personal history of transient ischemic attack (TIA), and cerebral infarction without residual deficits: Secondary | ICD-10-CM | POA: Diagnosis not present

## 2020-03-13 DIAGNOSIS — M109 Gout, unspecified: Secondary | ICD-10-CM | POA: Diagnosis not present

## 2020-03-13 DIAGNOSIS — Z87440 Personal history of urinary (tract) infections: Secondary | ICD-10-CM | POA: Diagnosis not present

## 2020-03-13 DIAGNOSIS — Z7901 Long term (current) use of anticoagulants: Secondary | ICD-10-CM | POA: Diagnosis not present

## 2020-03-13 DIAGNOSIS — M069 Rheumatoid arthritis, unspecified: Secondary | ICD-10-CM | POA: Diagnosis not present

## 2020-03-13 DIAGNOSIS — I219 Acute myocardial infarction, unspecified: Secondary | ICD-10-CM | POA: Diagnosis not present

## 2020-03-13 DIAGNOSIS — R32 Unspecified urinary incontinence: Secondary | ICD-10-CM | POA: Diagnosis not present

## 2020-03-13 DIAGNOSIS — Z6835 Body mass index (BMI) 35.0-35.9, adult: Secondary | ICD-10-CM | POA: Diagnosis not present

## 2020-03-13 DIAGNOSIS — E559 Vitamin D deficiency, unspecified: Secondary | ICD-10-CM | POA: Diagnosis not present

## 2020-03-13 DIAGNOSIS — E039 Hypothyroidism, unspecified: Secondary | ICD-10-CM | POA: Diagnosis not present

## 2020-03-18 ENCOUNTER — Ambulatory Visit: Payer: PPO | Admitting: Internal Medicine

## 2020-03-19 DIAGNOSIS — H5203 Hypermetropia, bilateral: Secondary | ICD-10-CM | POA: Diagnosis not present

## 2020-03-19 DIAGNOSIS — H353230 Exudative age-related macular degeneration, bilateral, stage unspecified: Secondary | ICD-10-CM | POA: Diagnosis not present

## 2020-03-19 DIAGNOSIS — H52223 Regular astigmatism, bilateral: Secondary | ICD-10-CM | POA: Diagnosis not present

## 2020-03-31 ENCOUNTER — Other Ambulatory Visit: Payer: Self-pay | Admitting: Internal Medicine

## 2020-03-31 NOTE — Telephone Encounter (Signed)
Rx request sent to pharmacy.  

## 2020-04-22 DIAGNOSIS — R059 Cough, unspecified: Secondary | ICD-10-CM | POA: Diagnosis not present

## 2020-04-22 DIAGNOSIS — Z6835 Body mass index (BMI) 35.0-35.9, adult: Secondary | ICD-10-CM | POA: Diagnosis not present

## 2020-04-22 DIAGNOSIS — B965 Pseudomonas (aeruginosa) (mallei) (pseudomallei) as the cause of diseases classified elsewhere: Secondary | ICD-10-CM | POA: Diagnosis not present

## 2020-04-22 DIAGNOSIS — J988 Other specified respiratory disorders: Secondary | ICD-10-CM | POA: Diagnosis not present

## 2020-04-22 DIAGNOSIS — R053 Chronic cough: Secondary | ICD-10-CM | POA: Diagnosis not present

## 2020-04-22 DIAGNOSIS — J209 Acute bronchitis, unspecified: Secondary | ICD-10-CM | POA: Diagnosis not present

## 2020-04-30 DIAGNOSIS — Z1231 Encounter for screening mammogram for malignant neoplasm of breast: Secondary | ICD-10-CM | POA: Diagnosis not present

## 2020-05-05 ENCOUNTER — Ambulatory Visit: Payer: PPO | Admitting: Pulmonary Disease

## 2020-05-05 ENCOUNTER — Other Ambulatory Visit: Payer: Self-pay

## 2020-05-05 ENCOUNTER — Telehealth: Payer: Self-pay

## 2020-05-05 ENCOUNTER — Encounter: Payer: Self-pay | Admitting: Pulmonary Disease

## 2020-05-05 VITALS — BP 128/80 | HR 57 | Temp 97.3°F | Ht 61.6 in | Wt 215.0 lb

## 2020-05-05 DIAGNOSIS — R059 Cough, unspecified: Secondary | ICD-10-CM

## 2020-05-05 DIAGNOSIS — I350 Nonrheumatic aortic (valve) stenosis: Secondary | ICD-10-CM | POA: Diagnosis not present

## 2020-05-05 DIAGNOSIS — J479 Bronchiectasis, uncomplicated: Secondary | ICD-10-CM | POA: Diagnosis not present

## 2020-05-05 DIAGNOSIS — K219 Gastro-esophageal reflux disease without esophagitis: Secondary | ICD-10-CM

## 2020-05-05 MED ORDER — TRELEGY ELLIPTA 100-62.5-25 MCG/INH IN AEPB: 100.0000 ug | INHALATION_SPRAY | Freq: Every day | RESPIRATORY_TRACT | 0 refills | Status: DC

## 2020-05-05 NOTE — Telephone Encounter (Signed)
Spoke to pharmacy regarding Trelegy Rx, had them cancel rx.

## 2020-05-05 NOTE — Progress Notes (Signed)
Subjective:    Patient ID: Jessica Kaufman, female    DOB: 30-Oct-1925, 87 y.o.   MRN: [id number] Chief Complaint  Patient presents with  . Consult    Cough, brown and green phlegm . Some nasal congestion.    HPI Jessica Kaufman is a 87 year old lifelong never smoker with a history as noted below, who presents for evaluation of a chronic cough present since November 2021.  She is kindly referred by Dr. Daiva Eves.  The patient notes that her symptoms started approximately around November 2021 sometime around Thanksgiving time.  She has noted cough that was productive of greenish to brown sputum.  She did not have any fevers, chills or sweats.  She also noted some associated sinus congestion symptoms and postnasal drip.  Since that time she has been treated with "4 rounds of antibiotics" last of which was Cipro after a culture of her sputum was positive for Pseudomonas.  Unfortunately we do not have any of her records available as these were all done at [facility].  She states that she had a chest x-ray in December that did not show any acute abnormality however, that film is not available to [place] for review nor the report is available.  She has not had any hemoptysis.  She has not had any apnea or paroxysmal nocturnal dyspnea though she notes that she sleeps in a recliner due to joint issues and due to her obstructive sleep apnea for which she uses CPAP.  She notes occasional lower extremity edema.  No chest pain.  She does have issues with gastroesophageal reflux symptoms are quite frequent.  She had been on a proton pump inhibitor in the past but not any longer.  Of note she has a history of rheumatoid arthritis and is on methotrexate and has been on methotrexate for approximately 5 to 6 years.  States she had pneumonia as a child first episode around 19 years of age and had it "2 years in a row".  He states that she had to be hospitalized during her bouts of pneumonia.  She has noted that she has had  "bronchitis" off and on through the years.  Her cough is really not improved by much except for antibiotics and recently noted that antibiotics did not help.  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  She has no pets in the home.  No unusual hobbies.  No hot tubs in the home.  Review of Systems A 10 point review of systems was performed and it is as noted above otherwise negative.  Past Medical History:  Diagnosis Date  . Aortic valve disease   . Asthma   . Cancer ([place])    basal cell   . Gout   . Hyperlipidemia   . Hypertension   . Hypertensive heart disease without heart failure   . Hypothyroidism   . PAF (paroxysmal atrial fibrillation) ([place])   . PVC (premature ventricular contraction)   . Rheumatoid arthritis ([place])   . Ventricular premature depolarization    Past Surgical History:  Procedure Laterality Date  . BLADDER SURGERY  10/19/2010  . BONY PELVIS SURGERY  1990  . BREAST BIOPSY Left 1986  . CARDIAC SURGERY  2009   PVC  . CATARACT EXTRACTION Left 11/15/00  . CATARACT EXTRACTION Right   . ELBOW SURGERY  06/1990  . HIP SURGERY Left 05/28/09  . HIP SURGERY Right 06/30/03  .  HYSTEROSCOPY  06/17/1986   vaginal   Family History  Problem Relation Age of Onset  . Stroke Mother   . Melanoma Mother   . Heart attack Father   . Uterine cancer Sister   . Melanoma Sister   . Heart failure Sister   . Heart attack Maternal Grandmother   . Breast cancer Daughter    Social History   Tobacco Use  . Smoking status: Never Smoker  . Smokeless tobacco: Never Used  Substance Use Topics  . Alcohol use: No   Allergies  Allergen Reactions  . Metronidazole Other (See Comments)    RASH  . Sulindac Other (See Comments)    UNKNOWN  . Naproxen Rash   Current Meds  Medication Sig  . acetaminophen (TYLENOL) 650 MG CR tablet Take 650 mg by mouth every 8 (eight) hours as needed for pain.  Marland Kitchen allopurinol (ZYLOPRIM) 300 MG  tablet Take 1 tablet by mouth every other day.   Marland Kitchen amoxicillin (AMOXIL) 500 MG tablet Take 2,000 mg by mouth 2 (two) times daily.  Marland Kitchen apixaban (ELIQUIS) 5 MG TABS tablet Take 5 mg by mouth 2 (two) times daily.   Marland Kitchen b complex vitamins tablet Take 1 tablet by mouth daily.  . Bilberry, Vaccinium myrtillus, 1000 MG CAPS Take 2 capsules by mouth daily.  Marland Kitchen Bioflavonoid Products (BIOFLEX) TABS Take 2 tablets by mouth daily.  . Calcium Carb-Cholecalciferol (OYSTER SHELL CALCIUM/VITAMIN D PO) daily.   . Cholecalciferol (VITAMIN D3) 1000 UNITS CAPS Take 1 capsule by mouth daily.  . Coenzyme Q10 (COQ10) 100 MG CAPS Take 1 capsule by mouth daily.  Marland Kitchen CRANBERRY PO Take 1 tablet by mouth daily.  . fexofenadine (ALLEGRA) 180 MG tablet Take 180 mg by mouth daily.  . folic acid (FOLVITE) 1 MG tablet Take by mouth daily.   Marland Kitchen gabapentin (NEURONTIN) 600 MG tablet Take 600 mg by mouth 2 (two) times daily.  . Ginger, Zingiber officinalis, (GINGER ROOT PO) Take by mouth daily.  Marland Kitchen levothyroxine (SYNTHROID, LEVOTHROID) 50 MCG tablet Take 1 tablet by mouth daily.  Marland Kitchen losartan (COZAAR) 50 MG tablet Take 1 tablet by mouth twice a day  . methotrexate (RHEUMATREX) 2.5 MG tablet Take by mouth. Takes 7 tablets weekly.  . metoprolol succinate (TOPROL-XL) 25 MG 24 hr tablet Take 1 tablet (25 mg total) by mouth at bedtime.  . Multiple Vitamins-Minerals (CENTRUM SILVER PO) Take 1 tablet by mouth daily.  . Multiple Vitamins-Minerals (PRESERVISION AREDS 2 PO) Take 1 capsule by mouth 2 (two) times daily.   . Omega-3 Fatty Acids (FISH OIL) 1000 MG CAPS Take 2 capsules by mouth daily.  Marland Kitchen oxybutynin (DITROPAN-XL) 10 MG 24 hr tablet Take 10 mg by mouth daily.  . predniSONE (DELTASONE) 5 MG tablet Take 5 mg by mouth daily with breakfast.  . PREMARIN vaginal cream Place 1 Applicatorful vaginally in the morning and at bedtime.   . psyllium (METAMUCIL) 58.6 % powder Take 1 packet by mouth daily.  . TURMERIC PO Take by mouth daily.    Immunization History  Administered Date(s) Administered  . Influenza, High Dose Seasonal PF 10/25/2018  . Moderna SARS-COV2 Booster Vaccination 01/08/2020  . Moderna Sars-Covid-2 Vaccination 01/19/2019, 02/15/2019  . Zoster Recombinat (Shingrix) 05/17/2017, 07/24/2017       Objective:   Physical Exam BP 128/80 (BP Location: Left Arm, Patient Position: Sitting, Cuff Size: Normal)   Pulse (!) 57   Temp (!) 97.3 F (36.3 C) (Temporal)   Ht 5' 1.6" (1.565 m)  Wt 215 lb (97.5 kg)   SpO2 97%   BMI 39.84 kg/m  GENERAL: Obese woman, no acute distress, spry, looks younger than stated age.  Occasional congested sounding cough. HEAD: Normocephalic, atraumatic.  EYES: Pupils equal, round, reactive to light.  No scleral icterus.  MOUTH: Nose/mouth/throat not examined due to masking requirements for COVID 19. NECK: Supple. No thyromegaly. Trachea midline. No JVD.  No adenopathy. PULMONARY: Good air entry bilaterally.  Scattered with rhonchi throughout.  No wheezes CARDIOVASCULAR: S1 and S2. Regular rate and rhythm.  Grade 2/6 to 3/6 holosystolic murmur at left sternal border consistent with AS. ABDOMEN: Obese, otherwise benign. MUSCULOSKELETAL: No joint deformity, no clubbing, no edema.  NEUROLOGIC: No focal deficit, no gait disturbance, speech is fluent. SKIN: Intact,warm,dry.  On limited exam, no rashes. PSYCH: Mood and behavior normal.    Assessment & Plan:     ICD-10-CM   1. Cough  R05.9 Pulmonary Function Test [facility] Only    CT CHEST WO CONTRAST   Suspect patient may have some underlying bronchiectasis Query airway inflammation Reportedly with recent Pseudomonas colonization PFTs, CT chest  2. Bronchiectasis without complication (HCC)  J47.9    Will evaluate with CT chest Evaluate for potential bronchial obstruction with PFTs  3. Aortic valve stenosis, etiology of cardiac valve disease unspecified  I35.0 ECHOCARDIOGRAM COMPLETE    CANCELED: ECHOCARDIOGRAM COMPLETE   Reassess  with 2D echo Last 2D echo March 2021  4. Gastroesophageal reflux disease, unspecified whether esophagitis present  K21.9    May need to resume PPI Query hiatal hernia    Orders Placed This Encounter  Procedures  . CT CHEST WO CONTRAST    Had CXR in December.    Standing Status:   Future    Standing Expiration Date:   05/05/2021    Scheduling Instructions:     3 weeks    Order Specific Question:   Preferred imaging location?    Answer:   [facility]  . Pulmonary Function Test [facility] Only    Standing Status:   Future    Standing Expiration Date:   05/05/2021    Scheduling Instructions:     3 weeks  . ECHOCARDIOGRAM COMPLETE    Standing Status:   Future    Standing Expiration Date:   11/05/2020    Order Specific Question:   Where should this test be performed    Answer:   [facility]    Order Specific Question:   Perflutren DEFINITY (image enhancing agent) should be administered unless hypersensitivity or allergy exist    Answer:   Administer Perflutren    Order Specific Question:   Reason for exam-Echo    Answer:   Aortic stenosis I35.0   Meds ordered this encounter  Medications  . DISCONTD: Fluticasone-Umeclidin-Vilant (TRELEGY ELLIPTA) 100-62.5-25 MCG/INH AEPB    Sig: Inhale 100 mcg into the lungs daily.    Dispense:  28 each    Refill:  0  . Fluticasone-Umeclidin-Vilant (TRELEGY ELLIPTA) 100-62.5-25 MCG/INH AEPB    Sig: Inhale 100 mcg into the lungs daily.    Dispense:  28 each    Refill:  0    Order Specific Question:   Lot Number?    Answer:   [id number]    Order Specific Question:   Expiration Date?    Answer:   10/02/2021    Order Specific Question:   Quantity    Answer:   2   Suspect the patient may have issues with bronchiectasis.  Particularly in view  of recent Pseudomonas colonization by report.  We will try to obtain her outside records as these have not been available to [place].  In any event, bronchiectasis should be further evaluated by CT scan of the chest as  well as PFTs.  Given that she clinically has airway inflammation we will give her a trial of Trelegy Ellipta 100/62.5/25, 1 inhalation daily to see if this will help with her symptoms.  She does have issues with aortic stenosis and will reassess with 2D echo.  We will see her in follow-up in 6 weeks time she is to contact us prior to that time should any new problems arise.   Renold Don, MD [facility]   *This note was dictated using voice recognition software/Dragon.  Despite best efforts to proofread, errors can occur which can change the meaning.  Any change was purely unintentional.

## 2020-05-05 NOTE — Patient Instructions (Addendum)
We are going to do a breathing test to evaluate your cough  We are going to do a CT chest to evaluate for potential bronchiectasis and effects of rheumatoid arthritis on the lung  We are ordering a 2D echo to evaluate your aortic stenosis as it has been a year since you have had the last one  We will give you a trial of Trelegy Ellipta 1 inhalation daily make sure that you rinse your mouth well after use it  Call you the results of tests as they come available  We will see him in follow-up in 4 to 6 weeks time he may see me or my nurse practitioner

## 2020-05-06 ENCOUNTER — Telehealth: Payer: Self-pay | Admitting: Pulmonary Disease

## 2020-05-06 NOTE — Telephone Encounter (Signed)
I have spoke with [name] and the PFT has been reschedule for 05/25/2020 @ 1:00pm and Covid rescheduled for 05/22/2020 @ 10:00am

## 2020-05-07 ENCOUNTER — Emergency Department: Payer: PPO

## 2020-05-07 ENCOUNTER — Observation Stay
Admission: EM | Admit: 2020-05-07 | Discharge: 2020-05-09 | Disposition: A | Discharge status: Home / Self Care | Payer: PPO | Attending: Internal Medicine | Admitting: Internal Medicine

## 2020-05-07 ENCOUNTER — Observation Stay: Payer: PPO

## 2020-05-07 ENCOUNTER — Other Ambulatory Visit: Payer: Self-pay

## 2020-05-07 DIAGNOSIS — I480 Paroxysmal atrial fibrillation: Principal | ICD-10-CM | POA: Insufficient documentation

## 2020-05-07 DIAGNOSIS — I4891 Unspecified atrial fibrillation: Secondary | ICD-10-CM | POA: Diagnosis present

## 2020-05-07 DIAGNOSIS — R000 Tachycardia, unspecified: Secondary | ICD-10-CM | POA: Diagnosis not present

## 2020-05-07 DIAGNOSIS — M069 Rheumatoid arthritis, unspecified: Secondary | ICD-10-CM | POA: Diagnosis not present

## 2020-05-07 DIAGNOSIS — I110 Hypertensive heart disease with heart failure: Secondary | ICD-10-CM | POA: Insufficient documentation

## 2020-05-07 DIAGNOSIS — R0902 Hypoxemia: Secondary | ICD-10-CM | POA: Diagnosis not present

## 2020-05-07 DIAGNOSIS — R55 Syncope and collapse: Secondary | ICD-10-CM | POA: Diagnosis not present

## 2020-05-07 DIAGNOSIS — Z79899 Other long term (current) drug therapy: Secondary | ICD-10-CM | POA: Insufficient documentation

## 2020-05-07 DIAGNOSIS — Z85828 Personal history of other malignant neoplasm of skin: Secondary | ICD-10-CM | POA: Diagnosis not present

## 2020-05-07 DIAGNOSIS — R42 Dizziness and giddiness: Secondary | ICD-10-CM | POA: Diagnosis not present

## 2020-05-07 DIAGNOSIS — J45909 Unspecified asthma, uncomplicated: Secondary | ICD-10-CM | POA: Diagnosis not present

## 2020-05-07 DIAGNOSIS — E039 Hypothyroidism, unspecified: Secondary | ICD-10-CM | POA: Diagnosis not present

## 2020-05-07 DIAGNOSIS — I10 Essential (primary) hypertension: Secondary | ICD-10-CM | POA: Diagnosis not present

## 2020-05-07 DIAGNOSIS — R7989 Other specified abnormal findings of blood chemistry: Secondary | ICD-10-CM | POA: Diagnosis present

## 2020-05-07 DIAGNOSIS — Z20822 Contact with and (suspected) exposure to covid-19: Secondary | ICD-10-CM | POA: Diagnosis not present

## 2020-05-07 DIAGNOSIS — R4020 Unspecified coma: Secondary | ICD-10-CM | POA: Diagnosis not present

## 2020-05-07 DIAGNOSIS — Z7901 Long term (current) use of anticoagulants: Secondary | ICD-10-CM | POA: Diagnosis not present

## 2020-05-07 DIAGNOSIS — I509 Heart failure, unspecified: Secondary | ICD-10-CM | POA: Diagnosis not present

## 2020-05-07 DIAGNOSIS — R0989 Other specified symptoms and signs involving the circulatory and respiratory systems: Secondary | ICD-10-CM | POA: Diagnosis not present

## 2020-05-07 DIAGNOSIS — I959 Hypotension, unspecified: Secondary | ICD-10-CM | POA: Diagnosis not present

## 2020-05-07 DIAGNOSIS — R778 Other specified abnormalities of plasma proteins: Secondary | ICD-10-CM

## 2020-05-07 DIAGNOSIS — J9811 Atelectasis: Secondary | ICD-10-CM | POA: Diagnosis not present

## 2020-05-07 DIAGNOSIS — M109 Gout, unspecified: Secondary | ICD-10-CM | POA: Diagnosis present

## 2020-05-07 LAB — CBC WITH DIFFERENTIAL/PLATELET
Abs Immature Granulocytes: 0.01 10*3/uL (ref 0.00–0.07)
Basophils Absolute: 0.1 10*3/uL (ref 0.0–0.1)
Basophils Relative: 1 %
Eosinophils Absolute: 0.2 10*3/uL (ref 0.0–0.5)
Eosinophils Relative: 3 %
HCT: 36.7 % (ref 36.0–46.0)
Hemoglobin: 12.5 g/dL (ref 12.0–15.0)
Immature Granulocytes: 0 %
Lymphocytes Relative: 30 %
Lymphs Abs: 1.7 10*3/uL (ref 0.7–4.0)
MCH: 33.3 pg (ref 26.0–34.0)
MCHC: 34.1 g/dL (ref 30.0–36.0)
MCV: 97.9 fL (ref 80.0–100.0)
Monocytes Absolute: 0.4 10*3/uL (ref 0.1–1.0)
Monocytes Relative: 7 %
Neutro Abs: 3.3 10*3/uL (ref 1.7–7.7)
Neutrophils Relative %: 59 %
Platelets: 250 10*3/uL (ref 150–400)
RBC: 3.75 MIL/uL — ABNORMAL LOW (ref 3.87–5.11)
RDW: 14.5 % (ref 11.5–15.5)
WBC: 5.6 10*3/uL (ref 4.0–10.5)
nRBC: 0 % (ref 0.0–0.2)

## 2020-05-07 LAB — BASIC METABOLIC PANEL
Anion gap: 8 (ref 5–15)
BUN: 13 mg/dL (ref 8–23)
CO2: 23 mmol/L (ref 22–32)
Calcium: 8.6 mg/dL — ABNORMAL LOW (ref 8.9–10.3)
Chloride: 106 mmol/L (ref 98–111)
Creatinine, Ser: 0.65 mg/dL (ref 0.44–1.00)
GFR, Estimated: >60 mL/min (ref >60)
Glucose, Bld: 112 mg/dL — ABNORMAL HIGH (ref 70–99)
Potassium: 3.5 mmol/L (ref 3.5–5.1)
Sodium: 137 mmol/L (ref 135–145)

## 2020-05-07 LAB — BRAIN NATRIURETIC PEPTIDE: B Natriuretic Peptide: 179.8 pg/mL — ABNORMAL HIGH (ref 0.0–100.0)

## 2020-05-07 LAB — TROPONIN I (HIGH SENSITIVITY)
Troponin I (High Sensitivity): 58 ng/L — ABNORMAL HIGH (ref <18)
Troponin I (High Sensitivity): 64 ng/L — ABNORMAL HIGH (ref <18)
Troponin I (High Sensitivity): 90 ng/L — ABNORMAL HIGH (ref <18)
Troponin I (High Sensitivity): 96 ng/L — ABNORMAL HIGH

## 2020-05-07 LAB — MAGNESIUM: Magnesium: 1.6 mg/dL — ABNORMAL LOW (ref 1.7–2.4)

## 2020-05-07 MED ORDER — OXYBUTYNIN CHLORIDE ER 10 MG PO TB24
10.0000 mg | ORAL_TABLET | Freq: Every day | ORAL | Status: DC
  Administered 2020-05-08 – 2020-05-09 (×2): 10 mg via ORAL
  Filled 2020-05-07 (×2): qty 1

## 2020-05-07 MED ORDER — HYDRALAZINE HCL 20 MG/ML IJ SOLN
5.0000 mg | INTRAMUSCULAR | Status: DC | PRN
  Administered 2020-05-08: 5 mg via INTRAVENOUS
  Filled 2020-05-07: qty 1

## 2020-05-07 MED ORDER — BILBERRY (VACCINIUM MYRTILLUS) 1000 MG PO CAPS: 2.0000 | ORAL_CAPSULE | Freq: Every day | ORAL | Status: DC

## 2020-05-07 MED ORDER — METOPROLOL TARTRATE 5 MG/5ML IV SOLN: 5.0000 mg | INTRAVENOUS | Status: DC | PRN

## 2020-05-07 MED ORDER — LORATADINE 10 MG PO TABS
10.0000 mg | ORAL_TABLET | Freq: Every day | ORAL | Status: DC
  Administered 2020-05-08 – 2020-05-09 (×2): 10 mg via ORAL
  Filled 2020-05-07 (×2): qty 1

## 2020-05-07 MED ORDER — POTASSIUM CHLORIDE CRYS ER 20 MEQ PO TBCR
40.0000 meq | EXTENDED_RELEASE_TABLET | Freq: Once | ORAL | Status: AC
  Administered 2020-05-07: 40 meq via ORAL
  Filled 2020-05-07: qty 2

## 2020-05-07 MED ORDER — BIOFLEX PO TABS: 2.0000 | ORAL_TABLET | Freq: Every day | ORAL | Status: DC

## 2020-05-07 MED ORDER — MAGNESIUM SULFATE 2 GM/50ML IV SOLN
2.0000 g | Freq: Once | INTRAVENOUS | Status: AC
  Administered 2020-05-07: 2 g via INTRAVENOUS
  Filled 2020-05-07: qty 50

## 2020-05-07 MED ORDER — TURMERIC 500 MG PO CAPS: 1.0000 | ORAL_CAPSULE | Freq: Every day | ORAL | Status: DC

## 2020-05-07 MED ORDER — SODIUM CHLORIDE 0.9 % IV SOLN: INTRAVENOUS | Status: DC

## 2020-05-07 MED ORDER — GINGER ROOT 500 MG PO CAPS: 1.0000 | ORAL_CAPSULE | Freq: Every day | ORAL | Status: DC

## 2020-05-07 MED ORDER — ONDANSETRON HCL 4 MG/2ML IJ SOLN: 4.0000 mg | Freq: Three times a day (TID) | INTRAMUSCULAR | Status: DC | PRN

## 2020-05-07 MED ORDER — OMEGA-3-ACID ETHYL ESTERS 1 G PO CAPS
1.0000 g | ORAL_CAPSULE | Freq: Every day | ORAL | Status: DC
  Administered 2020-05-08 – 2020-05-09 (×2): 1 g via ORAL
  Filled 2020-05-07 (×2): qty 1

## 2020-05-07 MED ORDER — FLUTICASONE-UMECLIDIN-VILANT 100-62.5-25 MCG/INH IN AEPB: 1.0000 | INHALATION_SPRAY | Freq: Every day | RESPIRATORY_TRACT | Status: DC

## 2020-05-07 MED ORDER — PREDNISONE 10 MG PO TABS
15.0000 mg | ORAL_TABLET | Freq: Every day | ORAL | Status: DC
  Administered 2020-05-08 – 2020-05-09 (×2): 15 mg via ORAL
  Filled 2020-05-07 (×2): qty 2

## 2020-05-07 MED ORDER — DM-GUAIFENESIN ER 30-600 MG PO TB12
1.0000 | ORAL_TABLET | Freq: Two times a day (BID) | ORAL | Status: DC | PRN
  Administered 2020-05-08: 1 via ORAL
  Filled 2020-05-07: qty 1

## 2020-05-07 MED ORDER — HYDROCORTISONE NA SUCCINATE PF 100 MG IJ SOLR
50.0000 mg | Freq: Once | INTRAMUSCULAR | Status: AC
  Administered 2020-05-07: 50 mg via INTRAVENOUS
  Filled 2020-05-07: qty 2

## 2020-05-07 MED ORDER — GABAPENTIN 600 MG PO TABS
600.0000 mg | ORAL_TABLET | Freq: Two times a day (BID) | ORAL | Status: DC
  Administered 2020-05-07 – 2020-05-09 (×4): 600 mg via ORAL
  Filled 2020-05-07 (×4): qty 1

## 2020-05-07 MED ORDER — COQ10 100 MG PO CAPS: 1.0000 | ORAL_CAPSULE | Freq: Every day | ORAL | Status: DC

## 2020-05-07 MED ORDER — FLUTICASONE FUROATE-VILANTEROL 100-25 MCG/INH IN AEPB
1.0000 | INHALATION_SPRAY | Freq: Every day | RESPIRATORY_TRACT | Status: DC
  Administered 2020-05-08 – 2020-05-09 (×2): 1 via RESPIRATORY_TRACT
  Filled 2020-05-07: qty 28

## 2020-05-07 MED ORDER — ADULT MULTIVITAMIN W/MINERALS CH
1.0000 | ORAL_TABLET | Freq: Every day | ORAL | Status: DC
  Administered 2020-05-08 – 2020-05-09 (×2): 1 via ORAL
  Filled 2020-05-07 (×2): qty 1

## 2020-05-07 MED ORDER — IOHEXOL 350 MG/ML SOLN
75.0000 mL | Freq: Once | INTRAVENOUS | Status: AC | PRN
  Administered 2020-05-07: 75 mL via INTRAVENOUS

## 2020-05-07 MED ORDER — ALLOPURINOL 300 MG PO TABS
300.0000 mg | ORAL_TABLET | ORAL | Status: DC
  Administered 2020-05-08: 300 mg via ORAL
  Filled 2020-05-07: qty 1

## 2020-05-07 MED ORDER — VITAMIN D 25 MCG (1000 UNIT) PO TABS
1000.0000 [IU] | ORAL_TABLET | Freq: Every day | ORAL | Status: DC
  Administered 2020-05-08 – 2020-05-09 (×2): 1000 [IU] via ORAL
  Filled 2020-05-07 (×2): qty 1

## 2020-05-07 MED ORDER — CALCIUM CARBONATE-VITAMIN D 500-200 MG-UNIT PO TABS
1.0000 | ORAL_TABLET | Freq: Every day | ORAL | Status: DC
  Administered 2020-05-09: 1 via ORAL
  Filled 2020-05-07 (×2): qty 1

## 2020-05-07 MED ORDER — FOLIC ACID 1 MG PO TABS
1.0000 mg | ORAL_TABLET | Freq: Every day | ORAL | Status: DC
  Administered 2020-05-08 – 2020-05-09 (×2): 1 mg via ORAL
  Filled 2020-05-07 (×2): qty 1

## 2020-05-07 MED ORDER — B COMPLEX-C PO TABS
1.0000 | ORAL_TABLET | Freq: Every day | ORAL | Status: DC
  Administered 2020-05-08 – 2020-05-09 (×2): 1 via ORAL
  Filled 2020-05-07 (×2): qty 1

## 2020-05-07 MED ORDER — METHOTREXATE 2.5 MG PO TABS
12.5000 mg | ORAL_TABLET | ORAL | Status: DC
Start: 2020-05-09 — End: 2020-05-09
  Filled 2020-05-07: qty 5

## 2020-05-07 MED ORDER — METOPROLOL SUCCINATE ER 25 MG PO TB24
25.0000 mg | ORAL_TABLET | Freq: Every day | ORAL | Status: DC
  Administered 2020-05-07 – 2020-05-08 (×2): 25 mg via ORAL
  Filled 2020-05-07 (×2): qty 1

## 2020-05-07 MED ORDER — ALBUTEROL SULFATE HFA 108 (90 BASE) MCG/ACT IN AERS
2.0000 | INHALATION_SPRAY | RESPIRATORY_TRACT | Status: DC | PRN
  Filled 2020-05-07: qty 6.7

## 2020-05-07 MED ORDER — UMECLIDINIUM BROMIDE 62.5 MCG/INH IN AEPB
1.0000 | INHALATION_SPRAY | Freq: Every day | RESPIRATORY_TRACT | Status: DC
  Administered 2020-05-08 – 2020-05-09 (×2): 1 via RESPIRATORY_TRACT
  Filled 2020-05-07: qty 7

## 2020-05-07 MED ORDER — APIXABAN 5 MG PO TABS
5.0000 mg | ORAL_TABLET | Freq: Two times a day (BID) | ORAL | Status: DC
  Administered 2020-05-07 – 2020-05-09 (×4): 5 mg via ORAL
  Filled 2020-05-07 (×4): qty 1

## 2020-05-07 MED ORDER — ACETAMINOPHEN 325 MG PO TABS
650.0000 mg | ORAL_TABLET | Freq: Four times a day (QID) | ORAL | Status: DC | PRN
  Administered 2020-05-07 – 2020-05-08 (×2): 650 mg via ORAL
  Filled 2020-05-07 (×2): qty 2

## 2020-05-07 MED ORDER — LEVOTHYROXINE SODIUM 50 MCG PO TABS
50.0000 ug | ORAL_TABLET | Freq: Every day | ORAL | Status: DC
  Administered 2020-05-08 – 2020-05-09 (×2): 50 ug via ORAL
  Filled 2020-05-07 (×2): qty 1

## 2020-05-07 MED ORDER — PSYLLIUM 95 % PO PACK
1.0000 | PACK | Freq: Every day | ORAL | Status: DC
  Administered 2020-05-09: 1 via ORAL
  Filled 2020-05-07 (×2): qty 1

## 2020-05-07 NOTE — Progress Notes (Signed)
PHARMACIST - PHYSICIAN ORDER COMMUNICATION  CONCERNING: P&T Medication Policy on Herbal Medications  DESCRIPTION:  This patient's order for:  Tumeric, CoQ-10, Bilberry, Bioflex, and Ginger Root  has been noted.  This product(s) is classified as an "herbal" or natural product. Due to a lack of definitive safety studies or FDA approval, nonstandard manufacturing practices, plus the potential risk of unknown drug-drug interactions while on inpatient medications, the Pharmacy and Therapeutics Committee does not permit the use of "herbal" or natural products of this type within [facility].   ACTION TAKEN: The pharmacy department is unable to verify this order at this time. Please reevaluate patient's clinical condition at discharge and address if the herbal or natural product(s) should be resumed at that time.  Paulina Fusi, PharmD, BCPS 05/07/2020 8:12 PM

## 2020-05-07 NOTE — H&P (Signed)
History and Physical    Jessica Kaufman:[id number] DOB: June 25, 1925 DOA: 05/07/2020  Referring MD/NP/PA:   PCP: Leonides Sake, MD   Patient coming from:  The patient is coming from home.     Chief Complaint: Lightheadedness, dizziness, syncope   HPI: Jessica Kaufman is a 87 y.o. female with medical history significant of hypertension, hyperlipidemia, asthma, hypothyroidism, gout, rheumatoid arthritis, PAF on Eliquis, PVC, aortic valve disease, who presents with lightheadedness, dizziness and syncope.  Per patient's daughter, pt woke up this morning. She felt dizzy and lightheaded, and then passed out for a few seconds. Patient does not remember passing out. Patient denies chest pain, shortness breath, palpitation.  She has mild dry cough. No nausea vomiting, diarrhea or abdominal pain.  No symptoms of UTI.  No unilateral numbness or tingling in extremities.  No facial droop or slurred speech.  Per report, patient had possible A. fib with RVR, with heart rate up to 140-150s.  Patient was hypotensive, reportedly had blood pressure 50s/30s.  Patient was given 500 cc normal saline and 12.5 mg of metoprolol.  Her heart rate improved to 90-105, blood pressure 138/74 at arrival to ED.  ED Course: pt was found to have WBC 5.6, troponin level 58, 64, 90, BNP 179, pending COVID-19 PCR, Mg 1.6, renal function okay, temperature normal, heart rate 102, 80, RR 19, oxygen saturation 98% on room air.  Chest x-ray showed mild left lower lobe atelectasis.  CT of head is negative for acute intracranial abnormalities.  Patient is placed on progressive bed for observation, Dr. Rockey Situ of cardiology is consulted.   CTA of chest: No evidence of pulmonary embolism.  Scattered parenchymal lung scarring and dependent atelectasis at the lung bases.  Aneurysmal dilatation ascending thoracic aorta 4.0 cm diameter; Recommend annual imaging followup by CTA or MRA, if clinically indicated based on  patient age and comorbidities. This recommendation follows 2010 ACCF/AHA/AATS/ACR/ASA/SCA/SCAI/SIR/STS/SVM Guidelines for the Diagnosis and Management of Patients with Thoracic Aortic Disease. Circulation. 2010; 121: W119-J478. Aortic aneurysm NOS (ICD10-I71.9)  Aortic Atherosclerosis (ICD10-I70.0) and Aortic aneurysm NOS (ICD10-I71.9).  Review of Systems:   General: no fevers, chills, no body weight gain, has fatigue HEENT: no blurry vision, hearing changes or sore throat Respiratory: no dyspnea, has coughing, no wheezing CV: no chest pain, no palpitations GI: no nausea, vomiting, abdominal pain, diarrhea, constipation GU: no dysuria, burning on urination, increased urinary frequency, hematuria  Ext: no leg edema Neuro: no unilateral weakness, numbness, or tingling, no vision change or hearing loss. Has lightheadedness and syncope Skin: no rash, no skin tear. MSK: No muscle spasm, no deformity, no limitation of range of movement in spin Heme: No easy bruising.  Travel history: No recent long distant travel.  Allergy:  Allergies  Allergen Reactions  . Metronidazole Other (See Comments)    RASH  . Sulindac Other (See Comments)    UNKNOWN  . Naproxen Rash    Past Medical History:  Diagnosis Date  . Aortic valve disease   . Asthma   . Cancer ([place])    basal cell   . Gout   . Hyperlipidemia   . Hypertension   . Hypertensive heart disease without heart failure   . Hypothyroidism   . PAF (paroxysmal atrial fibrillation) ([place])   . PVC (premature ventricular contraction)   . Rheumatoid arthritis ([place])   . Ventricular premature depolarization     Past Surgical History:  Procedure Laterality Date  . BLADDER SURGERY  10/19/2010  . BONY  PELVIS SURGERY  1990  . BREAST BIOPSY Left 1986  . CARDIAC SURGERY  2009   PVC  . CATARACT EXTRACTION Left 11/15/00  . CATARACT EXTRACTION Right   . ELBOW SURGERY  06/1990  . HIP SURGERY Left 05/28/09  . HIP SURGERY Right 06/30/03  .  HYSTEROSCOPY  06/17/1986   vaginal    Social History:  reports that she has never smoked. She has never used smokeless tobacco. She reports that she does not drink alcohol and does not use drugs.  Family History:  Family History  Problem Relation Age of Onset  . Stroke Mother   . Melanoma Mother   . Heart attack Father   . Uterine cancer Sister   . Melanoma Sister   . Heart failure Sister   . Heart attack Maternal Grandmother   . Breast cancer Daughter      Prior to Admission medications   Medication Sig Start Date End Date Taking? Authorizing Provider  acetaminophen (TYLENOL) 650 MG CR tablet Take 650 mg by mouth every 8 (eight) hours as needed for pain.    [provider]  allopurinol (ZYLOPRIM) 300 MG tablet Take 1 tablet by mouth every other day.  03/23/13   [provider]  amoxicillin (AMOXIL) 500 MG tablet Take 2,000 mg by mouth 2 (two) times daily. 09/21/18   [provider]  apixaban (ELIQUIS) 5 MG TABS tablet Take 5 mg by mouth 2 (two) times daily.     [provider]  b complex vitamins tablet Take 1 tablet by mouth daily.    [provider]  Bilberry, Vaccinium myrtillus, 1000 MG CAPS Take 2 capsules by mouth daily.    [provider]  Bioflavonoid Products (BIOFLEX) TABS Take 2 tablets by mouth daily.    [provider]  Calcium Carb-Cholecalciferol (OYSTER SHELL CALCIUM/VITAMIN D PO) daily.  12/03/13   [provider]  Cholecalciferol (VITAMIN D3) 1000 UNITS CAPS Take 1 capsule by mouth daily.    [provider]  Coenzyme Q10 (COQ10) 100 MG CAPS Take 1 capsule by mouth daily.    [provider]  CRANBERRY PO Take 1 tablet by mouth daily.    [provider]  fexofenadine (ALLEGRA) 180 MG tablet Take 180 mg by mouth daily.    [provider]  Fluticasone-Umeclidin-Vilant (TRELEGY ELLIPTA) 100-62.5-25 MCG/INH AEPB Inhale 100 mcg into the lungs daily. 05/05/20   Tyler Pita, MD  folic acid (FOLVITE) 1 MG tablet Take by mouth daily.     [provider]  gabapentin (NEURONTIN) 600 MG tablet Take 600 mg by mouth 2 (two) times daily. 01/27/20   [provider]  Ginger, Zingiber officinalis, (GINGER ROOT PO) Take by mouth daily.    [provider]  hydrochlorothiazide (HYDRODIURIL) 25 MG tablet Take 1 tablet (25 mg total) by mouth daily. 12/03/19 03/02/20  End, Harrell Gave, MD  levothyroxine (SYNTHROID, LEVOTHROID) 50 MCG tablet Take 1 tablet by mouth daily. 06/10/13   [provider]  losartan (COZAAR) 50 MG tablet Take 1 tablet by mouth twice a day 03/31/20   End, Harrell Gave, MD  methotrexate (RHEUMATREX) 2.5 MG tablet Take by mouth. Takes 7 tablets weekly. 10/09/17   [provider]  metoprolol succinate (TOPROL-XL) 25 MG 24 hr tablet Take 1 tablet (25 mg total) by mouth at bedtime. 12/03/19   End, Harrell Gave, MD  Multiple Vitamins-Minerals (CENTRUM SILVER PO) Take 1 tablet by mouth daily.    [provider]  Multiple Vitamins-Minerals (  PRESERVISION AREDS 2 PO) Take 1 capsule by mouth 2 (two) times daily.     [provider]  Omega-3 Fatty Acids (FISH OIL) 1000 MG CAPS Take 2 capsules by mouth daily.    [provider]  oxybutynin (DITROPAN-XL) 10 MG 24 hr tablet Take 10 mg by mouth daily. 03/24/17   [provider]  predniSONE (DELTASONE) 5 MG tablet Take 5 mg by mouth daily with breakfast.    [provider]  PREMARIN vaginal cream Place 1 Applicatorful vaginally in the morning and at bedtime.  10/29/18   [provider]  psyllium (METAMUCIL) 58.6 % powder Take 1 packet by mouth daily.    [provider]  TURMERIC PO Take by mouth daily.    [provider]    Physical Exam: Vitals:   05/07/20 1400 05/07/20 1440 05/07/20 1500 05/07/20 1530  BP: (!) 147/77 (!) 153/71 (!) 153/60 (!) 151/66  Pulse: 68 69 77 64  Resp: 13 15 16 15   Temp:       TempSrc:      SpO2: 99% 98% 99% 99%  Weight:      Height:       General: Not in acute distress HEENT:       Eyes: PERRL, EOMI, no scleral icterus.       ENT: No discharge from the ears and nose, no pharynx injection, no tonsillar enlargement.        Neck: No JVD, no bruit, no mass felt. Heme: No neck lymph node enlargement. Cardiac: S1/S2, RRR,  No gallops or rubs. Respiratory: No rales, wheezing, rhonchi or rubs. GI: Soft, nondistended, nontender, no rebound pain, no organomegaly, BS present. GU: No hematuria Ext: No pitting leg edema bilaterally. 1+DP/PT pulse bilaterally. Musculoskeletal: No joint deformities, No joint redness or warmth, no limitation of ROM in spin. Skin: No rashes.  Neuro: Alert, oriented X3, cranial nerves II-XII grossly intact, moves all extremities normally.  Psych: Patient is not psychotic, no suicidal or hemocidal ideation.  Labs on Admission: I have personally reviewed following labs and imaging studies  CBC: Recent Labs  Lab 05/07/20 0918  WBC 5.6  NEUTROABS 3.3  HGB 12.5  HCT 36.7  MCV 97.9  PLT [id number]   Basic Metabolic Panel: Recent Labs  Lab 05/07/20 0918  NA 137  K 3.5  CL 106  CO2 23  GLUCOSE 112*  BUN 13  CREATININE 0.65  CALCIUM 8.6*  MG 1.6*   GFR: Estimated Creatinine Clearance: 47.2 mL/min (by C-G formula based on SCr of 0.65 mg/dL). Liver Function Tests: No results for input(s): AST, ALT, ALKPHOS, BILITOT, PROT, ALBUMIN in the last 168 hours. No results for input(s): LIPASE, AMYLASE in the last 168 hours. No results for input(s): AMMONIA in the last 168 hours. Coagulation Profile: No results for input(s): INR, PROTIME in the last 168 hours. Cardiac Enzymes: No results for input(s): CKTOTAL, CKMB, CKMBINDEX, TROPONINI in the last 168 hours. BNP (last 3 results) No results for input(s): PROBNP in the last 8760 hours. HbA1C: No results for input(s): HGBA1C in the last 72 hours. CBG: No results for input(s): GLUCAP in  the last 168 hours. Lipid Profile: No results for input(s): CHOL, HDL, LDLCALC, TRIG, CHOLHDL, LDLDIRECT in the last 72 hours. Thyroid Function Tests: No results for input(s): TSH, T4TOTAL, FREET4, T3FREE, THYROIDAB in the last 72 hours. Anemia Panel: No results for input(s): VITAMINB12, FOLATE, FERRITIN, TIBC, IRON, RETICCTPCT in the last 72 hours. Urine analysis:    Component Value  Date/Time   COLORURINE YELLOW 05/20/2009 [place] 05/20/2009 1356   LABSPEC 1.011 05/20/2009 1356   PHURINE 6.0 05/20/2009 1356   GLUCOSEU NEGATIVE 05/20/2009 1356   HGBUR SMALL (A) 05/20/2009 1356   BILIRUBINUR NEGATIVE 05/20/2009 1356   [place] 05/20/2009 1356   PROTEINUR NEGATIVE 05/20/2009 1356   UROBILINOGEN 1.0 05/20/2009 1356   NITRITE NEGATIVE 05/20/2009 [place] 05/20/2009 1356   Sepsis Labs: @LABRCNTIP (procalcitonin:4,lacticidven:4) )No results found for this or any previous visit (from the past 240 hour(s)).   Radiological Exams on Admission: CT HEAD WO CONTRAST  Result Date: 05/07/2020 CLINICAL DATA:  Syncope. EXAM: CT HEAD WITHOUT CONTRAST TECHNIQUE: Contiguous axial images were obtained from the base of the skull through the vertex without intravenous contrast. COMPARISON:  01/09/2020 FINDINGS: Brain: No evidence of acute infarction, hemorrhage, hydrocephalus, extra-axial collection or mass lesion/mass effect. There is mild diffuse low-attenuation within the subcortical and periventricular white matter compatible with chronic microvascular disease. Remote right basal ganglia lacunar infarct is again noted. Prominence of sulci and ventricles compatible with brain atrophy. Vascular: No hyperdense vessel or unexpected calcification. Skull: Normal. Negative for fracture or focal lesion. Sinuses/Orbits: No acute finding. Other: None IMPRESSION: 1. No acute intracranial abnormalities. 2. Chronic small vessel ischemic disease and brain atrophy.  Electronically Signed   By: Kerby Moors M.D.   On: 05/07/2020 14:55   DG Chest Portable 1 View  Result Date: 05/07/2020 CLINICAL DATA:  Syncope.  Atrial fibrillation and hypertension EXAM: PORTABLE CHEST 1 VIEW COMPARISON:  12/19/2019 FINDINGS: Heart size within normal limits. Negative for heart failure. Mild left lower lobe atelectasis. Right lung clear. No edema or effusion. IMPRESSION: Mild left lower lobe atelectasis. Electronically Signed   By: Franchot Gallo M.D.   On: 05/07/2020 09:57     EKG: I have personally reviewed.  First EKG showed atrial fibrillation, QTC 441, heart rate 111, ST depression/T wave inversion in inferior leads and V4-V6.  Second EKG showed sinus rhythm, regular.  Assessment/Plan Principal Problem:   Syncope Active Problems:   Labile hypertension   Paroxysmal atrial fibrillation ([facility])   Atrial fibrillation with RVR ([facility])   Gout   Hypothyroidism   Rheumatoid arthritis ([facility])   Elevated troponin   Hypomagnesemia   Syncope: CT head negative for acute intracranial abnormalities.  No focal neurodeficit on physical examination, low suspicions for stroke.  CT angiograms negative for PE.  Possibly due to A. fib with RVR and hypotension.  Blood pressure has improved.  -Placed on progressive benefit observation -IV fluid: 75 cc/h for normal saline -Frequent neuro check.  -Check orthostatic vital sign -PT/OT  Labile hypertension: -Hold home HCTZ and Cozaar due to hypotension -Continue metoprolol -IV hydralazine as needed  Atrial fibrillation with RVR: Patient is converted to sinus rhythm.  Currently heart rate at 80s -Eliquis -Metoprolol  Gout -Allopurinol  Hypothyroidism -Synthroid  Rheumatoid arthritis (HCC) -Continue methotrexate -Give 1 dose of Solu-Cortef 50 mg as stress dose -Increase home prednisone dose from 5 to 15 mg daily  Elevated troponin: Patient does not have chest pain.  Troponin level 58, 64, 90.  Gradually trending up.  Consulted  Dr. Rockey Situ of cardiology -Will not start aspirin since patient is on Eliquis -Check A1c, FLP -Trend troponin  Hypomagnesemia: mg 1.6 -2 g of magnesium sulfate  Aneurysmal dilatation ascending thoracic aorta:  4.0 cm diameter by CTA -f/u with PCP   DVT ppx: on Eliquis Code Status: Full code Family Communication:    Yes,  patient's  daughter at bed side Disposition Plan:  Anticipate discharge back to previous environment Consults called:  none Admission status and Level of care: Progressive Cardiac:   for obs   Status is: Observation  The patient remains OBS appropriate and will d/c before 2 midnights.  Dispo: The patient is from: Home              Anticipated d/c is to: Home              Patient currently is not medically stable to d/c.   Difficult to place patient No            Date of Service 05/07/2020    Ivor Costa [facility]   If 7PM-7AM, please contact night-coverage [email or web address] 05/07/2020, 5:21 PM

## 2020-05-07 NOTE — Plan of Care (Signed)
Patient new admission from ED today. VSS, NAD observed. Pt alert and oriented x3. Some c/o pain during shift; medicated and effective in reducing pain. See Assessment for details.  Problem: Education: Goal: Knowledge of General Education information will improve Description: Including pain rating scale, medication(s)/side effects and non-pharmacologic comfort measures Outcome: Progressing   Problem: Health Behavior/Discharge Planning: Goal: Ability to manage health-related needs will improve Outcome: Progressing   Problem: Clinical Measurements: Goal: Ability to maintain clinical measurements within normal limits will improve Outcome: Progressing Goal: Will remain free from infection Outcome: Progressing Goal: Diagnostic test results will improve Outcome: Progressing Goal: Respiratory complications will improve Outcome: Progressing Goal: Cardiovascular complication will be avoided Outcome: Progressing   Problem: Activity: Goal: Risk for activity intolerance will decrease Outcome: Progressing   Problem: Nutrition: Goal: Adequate nutrition will be maintained Outcome: Progressing   Problem: Coping: Goal: Level of anxiety will decrease Outcome: Progressing   Problem: Elimination: Goal: Will not experience complications related to bowel motility Outcome: Progressing Goal: Will not experience complications related to urinary retention Outcome: Progressing   Problem: Pain Managment: Goal: General experience of comfort will improve Outcome: Progressing   Problem: Safety: Goal: Ability to remain free from injury will improve Outcome: Progressing   Problem: Skin Integrity: Goal: Risk for impaired skin integrity will decrease Outcome: Progressing

## 2020-05-07 NOTE — ED Notes (Signed)
Informed RN bed assigned 

## 2020-05-07 NOTE — ED Triage Notes (Signed)
Pt BIB EMS, experienced syncopal episode this AM, with EMS pt in a-fib RVR and hypotensive with 50s/30s. EMS states that en route pt's BP improved so was given 12.5 metoprolol that resolved rapid heart rate, also givn 543mL NS bolus, pt in a-fib on arrival with rates 90-105 and BP of 138/74, pt alert and oriented at this time, does not recall syncopal event.

## 2020-05-07 NOTE — ED Provider Notes (Signed)
[facility] Emergency Department Provider Note   ____________________________________________   Event Date/Time   First MD Initiated Contact with Patient 05/07/20 [phone]     (approximate)  I have reviewed the triage vital signs and the nursing notes.   HISTORY  Chief Complaint Loss of Consciousness    HPI Jessica Kaufman is a 87 y.o. female with past medical history of hypertension, hyperlipidemia, rheumatoid arthritis, paroxysmal atrial fibrillation on Eliquis, and gout who presents to the ED for syncope.  Patient reports that she woke up this morning and was feeling weaker than usual and lightheaded.  She denies any associated palpitations, chest pain, or shortness of breath.  She was speaking to her daughter on the phone when the connection was suddenly lost, daughter subsequently called EMS to check on the patient.  They found the patient awake and alert but when they checked her vital signs, found her heart rate to be in the 140s to 150s.  As they were moving the patient onto the stretcher, she had a syncopal episode and was briefly hypotensive.  She was not yet on the cardiac monitor at the time, continue to be in rapid irregular rate when placed on monitor.  Patient does not remember passing out and denies any chest pain or shortness of breath.  When blood pressure stabilized, EMS gave patient 2.5 mg of IV metoprolol with improvement in her heart rate.  Patient denies any history of similar episodes of syncope.        Past Medical History:  Diagnosis Date  . Aortic valve disease   . Asthma   . Cancer ([place])    basal cell   . Gout   . Hyperlipidemia   . Hypertension   . Hypertensive heart disease without heart failure   . Hypothyroidism   . PAF (paroxysmal atrial fibrillation) ([place])   . PVC (premature ventricular contraction)   . Rheumatoid arthritis ([place])   . Ventricular premature depolarization     Patient Active Problem List   Diagnosis  Date Noted  . Atrial fibrillation with RVR ([place]) 05/07/2020  . Atypical chest pain 10/03/2019  . Aortic valve stenosis 10/03/2019  . Palpitations 04/04/2019  . Dizziness 04/04/2019  . PSVT (paroxysmal supraventricular tachycardia) ([place]) 04/04/2019  . Near syncope 02/20/2019  . Labile hypertension 12/12/2017  . Paroxysmal atrial fibrillation ([place]) 12/12/2017  . Aortic valve disease 12/12/2017    Past Surgical History:  Procedure Laterality Date  . BLADDER SURGERY  10/19/2010  . BONY PELVIS SURGERY  1990  . BREAST BIOPSY Left 1986  . CARDIAC SURGERY  2009   PVC  . CATARACT EXTRACTION Left 11/15/00  . CATARACT EXTRACTION Right   . ELBOW SURGERY  06/1990  . HIP SURGERY Left 05/28/09  . HIP SURGERY Right 06/30/03  . HYSTEROSCOPY  06/17/1986   vaginal    Prior to Admission medications   Medication Sig Start Date End Date Taking? Authorizing Provider  acetaminophen (TYLENOL) 650 MG CR tablet Take 650 mg by mouth every 8 (eight) hours as needed for pain.    [provider]  allopurinol (ZYLOPRIM) 300 MG tablet Take 1 tablet by mouth every other day.  03/23/13   [provider]  amoxicillin (AMOXIL) 500 MG tablet Take 2,000 mg by mouth 2 (two) times daily. 09/21/18   [provider]  apixaban (ELIQUIS) 5 MG TABS tablet Take 5 mg by mouth 2 (two) times daily.     [provider]  b complex vitamins  tablet Take 1 tablet by mouth daily.    [provider]  Bilberry, Vaccinium myrtillus, 1000 MG CAPS Take 2 capsules by mouth daily.    [provider]  Bioflavonoid Products (BIOFLEX) TABS Take 2 tablets by mouth daily.    [provider]  Calcium Carb-Cholecalciferol (OYSTER SHELL CALCIUM/VITAMIN D PO) daily.  12/03/13   [provider]  Cholecalciferol (VITAMIN D3) 1000 UNITS CAPS Take 1 capsule by mouth daily.    [provider]  Coenzyme Q10 (COQ10) 100 MG CAPS Take 1 capsule by mouth daily.    [provider]  CRANBERRY PO Take 1 tablet by mouth daily.    [provider]  fexofenadine (ALLEGRA) 180 MG tablet Take 180 mg by mouth daily.    [provider]  Fluticasone-Umeclidin-Vilant (TRELEGY ELLIPTA) 100-62.5-25 MCG/INH AEPB Inhale 100 mcg into the lungs daily. 05/05/20   Tyler Pita, MD  folic acid (FOLVITE) 1 MG tablet Take by mouth daily.     [provider]  gabapentin (NEURONTIN) 600 MG tablet Take 600 mg by mouth 2 (two) times daily. 01/27/20   [provider]  Ginger, Zingiber officinalis, (GINGER ROOT PO) Take by mouth daily.    [provider]  hydrochlorothiazide (HYDRODIURIL) 25 MG tablet Take 1 tablet (25 mg total) by mouth daily. 12/03/19 03/02/20  End, Harrell Gave, MD  levothyroxine (SYNTHROID, LEVOTHROID) 50 MCG tablet Take 1 tablet by mouth daily. 06/10/13   [provider]  losartan (COZAAR) 50 MG tablet Take 1 tablet by mouth twice a day 03/31/20   End, Harrell Gave, MD  methotrexate (RHEUMATREX) 2.5 MG tablet Take by mouth. Takes 7 tablets weekly. 10/09/17   [provider]  metoprolol succinate (TOPROL-XL) 25 MG 24 hr tablet Take 1 tablet (25 mg total) by mouth at bedtime. 12/03/19   End, Harrell Gave, MD  Multiple Vitamins-Minerals (CENTRUM SILVER PO) Take 1 tablet by mouth daily.    [provider]  Multiple Vitamins-Minerals (PRESERVISION AREDS 2 PO) Take 1 capsule by mouth 2 (two) times daily.     [provider]  Omega-3 Fatty Acids (FISH OIL) 1000 MG CAPS Take 2 capsules by mouth daily.    [provider]  oxybutynin (DITROPAN-XL) 10 MG 24 hr tablet Take 10 mg by mouth daily. 03/24/17   [provider]  predniSONE (DELTASONE) 5 MG tablet Take 5 mg by mouth daily with breakfast.    [provider]  PREMARIN vaginal cream Place 1 Applicatorful vaginally in the morning and at bedtime.  10/29/18   [provider]  psyllium (METAMUCIL) 58.6 % powder Take 1  packet by mouth daily.    [provider]  TURMERIC PO Take by mouth daily.    [provider]    Allergies Metronidazole, Sulindac, and Naproxen  Family History  Problem Relation Age of Onset  . Stroke Mother   . Melanoma Mother   . Heart attack Father   . Uterine cancer Sister   . Melanoma Sister   . Heart failure Sister   . Heart attack Maternal Grandmother   . Breast cancer Daughter     Social History Social History   Tobacco Use  . Smoking status: Never Smoker  . Smokeless tobacco: Never Used  Vaping Use  . Vaping Use: Never used  Substance Use Topics  . Alcohol use: No  . Drug use: No    Review of Systems  Constitutional: No fever/chills Eyes: No visual changes. ENT: No sore throat.  Cardiovascular: Denies chest pain.  Positive for lightheadedness and syncope. Respiratory: Denies shortness of breath. Gastrointestinal: No abdominal pain.  No nausea, no vomiting.  No diarrhea.  No constipation. Genitourinary: Negative for dysuria. Musculoskeletal: Negative for back pain. Skin: Negative for rash. Neurological: Negative for headaches, focal weakness or numbness.  ____________________________________________   PHYSICAL EXAM:  VITAL SIGNS: ED Triage Vitals  Enc Vitals Group     BP      Pulse      Resp      Temp      Temp src      SpO2      Weight      Height      Head Circumference      Peak Flow      Pain Score      Pain Loc      Pain Edu?      Excl. in [place]?     Constitutional: Alert and oriented. Eyes: Conjunctivae are normal. Head: Atraumatic. Nose: No congestion/rhinnorhea. Mouth/Throat: Mucous membranes are moist. Neck: Normal ROM Cardiovascular: Tachycardic, irregularly irregular rhythm. Grossly normal heart sounds.  2+ radial pulses bilaterally. Respiratory: Normal respiratory effort.  No retractions. Lungs CTAB. Gastrointestinal: Soft and nontender. No distention. Genitourinary: deferred Musculoskeletal: No lower  extremity tenderness nor edema. Neurologic:  Normal speech and language. No gross focal neurologic deficits are appreciated. Skin:  Skin is warm, dry and intact. No rash noted. Psychiatric: Mood and affect are normal. Speech and behavior are normal.  ____________________________________________   LABS (all labs ordered are listed, but only abnormal results are displayed)  Labs Reviewed  BASIC METABOLIC PANEL - Abnormal; Notable for the following components:      Result Value   Glucose, Bld 112 (*)    Calcium 8.6 (*)    All other components within normal limits  CBC WITH DIFFERENTIAL/PLATELET - Abnormal; Notable for the following components:   RBC 3.75 (*)    All other components within normal limits  MAGNESIUM - Abnormal; Notable for the following components:   Magnesium 1.6 (*)    All other components within normal limits  TROPONIN I (HIGH SENSITIVITY) - Abnormal; Notable for the following components:   Troponin I (High Sensitivity) 58 (*)    All other components within normal limits  SARS CORONAVIRUS 2 (TAT 6-24 HRS)  TROPONIN I (HIGH SENSITIVITY)   ____________________________________________  EKG  ED ECG REPORT I, Blake Divine, the attending physician, personally viewed and interpreted this ECG.   Date: 05/07/2020  EKG Time: 9:14  Rate: 111  Rhythm: atrial fibrillation  Axis: Normal  Intervals:none  ST&T Change: Nonspecific T wave changes  ED ECG REPORT I, Blake Divine, the attending physician, personally viewed and interpreted this ECG.   Date: 05/07/2020  EKG Time: 10:36  Rate: 60  Rhythm: normal sinus rhythm  Axis: Normal  Intervals:first-degree A-V block   ST&T Change: None    PROCEDURES  Procedure(s) performed (including Critical Care):  Procedures   ____________________________________________   INITIAL IMPRESSION / ASSESSMENT AND PLAN / ED COURSE       87 year old female with past medical history of hypertension, hyperlipidemia,  rheumatoid arthritis, and paroxysmal atrial fibrillation on Eliquis who presents to the ED with lightheadedness and weakness since waking up this morning, noted to have syncopal episode with EMS.  She was also found to be in rapid A. fib, given initial IV dose of metoprolol prior to arrival.  She continues to have A. fib with rapid ventricular response, no ischemic  changes noted on EKG and she denies chest pain or shortness of breath.  We will give additional IV metoprolol for heart rate control, check labs for electrolyte abnormality or elevated troponin.  Patient denies any infectious symptoms, states she has been compliant with metoprolol.  Anticipate admission given syncopal episode associated with atrial fibrillation with RVR.  Patient converted to normal sinus rhythm prior to any further dosing of IV metoprolol.  Repeat EKG shows no ischemic changes although troponin is mildly elevated, likely secondary to her previously accelerated rate.  Magnesium is slightly low and we will replete.  Given her syncopal episode with elevated troponin, case discussed with hospitalist for admission.      ____________________________________________   FINAL CLINICAL IMPRESSION(S) / ED DIAGNOSES  Final diagnoses:  Syncope and collapse  Atrial fibrillation with RVR [facility])     ED Discharge Orders    None       Note:  This document was prepared using Dragon voice recognition software and may include unintentional dictation errors.   Blake Divine, MD 05/07/20 1122

## 2020-05-07 NOTE — ED Notes (Signed)
This RN noted pt to be in sinus rhythm, repeat ECG obtained and given to [facility] MD.

## 2020-05-08 ENCOUNTER — Observation Stay (HOSPITAL_BASED_OUTPATIENT_CLINIC_OR_DEPARTMENT_OTHER)
Admit: 2020-05-08 | Discharge: 2020-05-08 | Disposition: A | Discharge status: Home / Self Care | Payer: PPO | Attending: Internal Medicine | Admitting: Internal Medicine

## 2020-05-08 ENCOUNTER — Telehealth: Payer: Self-pay | Admitting: Internal Medicine

## 2020-05-08 DIAGNOSIS — R778 Other specified abnormalities of plasma proteins: Secondary | ICD-10-CM | POA: Diagnosis not present

## 2020-05-08 DIAGNOSIS — I350 Nonrheumatic aortic (valve) stenosis: Secondary | ICD-10-CM

## 2020-05-08 DIAGNOSIS — I10 Essential (primary) hypertension: Secondary | ICD-10-CM | POA: Diagnosis not present

## 2020-05-08 DIAGNOSIS — I480 Paroxysmal atrial fibrillation: Principal | ICD-10-CM

## 2020-05-08 DIAGNOSIS — I340 Nonrheumatic mitral (valve) insufficiency: Secondary | ICD-10-CM | POA: Diagnosis not present

## 2020-05-08 DIAGNOSIS — I4891 Unspecified atrial fibrillation: Secondary | ICD-10-CM | POA: Diagnosis not present

## 2020-05-08 DIAGNOSIS — M1000 Idiopathic gout, unspecified site: Secondary | ICD-10-CM | POA: Diagnosis not present

## 2020-05-08 DIAGNOSIS — R55 Syncope and collapse: Secondary | ICD-10-CM

## 2020-05-08 LAB — CBC
HCT: 37.3 % (ref 36.0–46.0)
Hemoglobin: 12.5 g/dL (ref 12.0–15.0)
MCH: 32.8 pg (ref 26.0–34.0)
MCHC: 33.5 g/dL (ref 30.0–36.0)
MCV: 97.9 fL (ref 80.0–100.0)
Platelets: 251 10*3/uL (ref 150–400)
RBC: 3.81 MIL/uL — ABNORMAL LOW (ref 3.87–5.11)
RDW: 14.5 % (ref 11.5–15.5)
WBC: 6.6 10*3/uL (ref 4.0–10.5)
nRBC: 0 % (ref 0.0–0.2)

## 2020-05-08 LAB — MAGNESIUM: Magnesium: 2 mg/dL (ref 1.7–2.4)

## 2020-05-08 LAB — BASIC METABOLIC PANEL
Anion gap: 7 (ref 5–15)
BUN: 15 mg/dL (ref 8–23)
CO2: 26 mmol/L (ref 22–32)
Calcium: 8.6 mg/dL — ABNORMAL LOW (ref 8.9–10.3)
Chloride: 106 mmol/L (ref 98–111)
Creatinine, Ser: 0.46 mg/dL (ref 0.44–1.00)
GFR, Estimated: >60 mL/min (ref >60)
Glucose, Bld: 104 mg/dL — ABNORMAL HIGH (ref 70–99)
Potassium: 3.7 mmol/L (ref 3.5–5.1)
Sodium: 139 mmol/L (ref 135–145)

## 2020-05-08 LAB — ECHOCARDIOGRAM COMPLETE
AR max vel: 2.76 cm2
AV Area VTI: 3.11 cm2
AV Area mean vel: 2.78 cm2
AV Mean grad: 5.4 mmHg
AV Peak grad: 9.1 mmHg
Ao pk vel: 1.51 m/s
Area-P 1/2: 4.83 cm2
Height: 62 in
S' Lateral: 2.38 cm
Weight: 3410.96 oz

## 2020-05-08 LAB — TROPONIN I (HIGH SENSITIVITY): Troponin I (High Sensitivity): 71 ng/L — ABNORMAL HIGH (ref <18)

## 2020-05-08 LAB — LIPID PANEL
Cholesterol: 199 mg/dL (ref 0–200)
HDL: 59 mg/dL (ref >40)
LDL Cholesterol: 122 mg/dL — ABNORMAL HIGH (ref 0–99)
Total CHOL/HDL Ratio: 3.4 RATIO
Triglycerides: 88 mg/dL (ref <150)
VLDL: 18 mg/dL (ref 0–40)

## 2020-05-08 LAB — SARS CORONAVIRUS 2 (TAT 6-24 HRS): SARS Coronavirus 2: NEGATIVE

## 2020-05-08 LAB — HEMOGLOBIN A1C
Hgb A1c MFr Bld: 5.5 % (ref 4.8–5.6)
Mean Plasma Glucose: 111.15 mg/dL

## 2020-05-08 LAB — TSH: TSH: 1.024 u[IU]/mL (ref 0.350–4.500)

## 2020-05-08 MED ORDER — HYDRALAZINE HCL 20 MG/ML IJ SOLN
10.0000 mg | INTRAMUSCULAR | Status: DC | PRN
  Administered 2020-05-08 (×2): 10 mg via INTRAVENOUS
  Filled 2020-05-08 (×2): qty 1

## 2020-05-08 MED ORDER — LOSARTAN POTASSIUM 50 MG PO TABS
50.0000 mg | ORAL_TABLET | Freq: Every day | ORAL | Status: DC
  Administered 2020-05-08 – 2020-05-09 (×2): 50 mg via ORAL
  Filled 2020-05-08 (×2): qty 1

## 2020-05-08 NOTE — Progress Notes (Signed)
[facility]                                                                              Patient Demographics  Jessica Kaufman, is a 87 y.o. female, DOB - 11-05-1925, IOE:[id number]  Admit date - 05/07/2020   Admitting Physician Ivor Costa, MD  Outpatient Primary MD for the patient is Hamrick, Lorin Mercy, MD  Outpatient specialists:   LOS - 0  days   Medical records reviewed and are as summarized below:    Chief Complaint  Patient presents with  . Loss of Consciousness       Brief summary   Patient is a a 87 year old female with history of hypertension, hyperlipidemia, asthma, hypothyroidism, gout, RA, paroxysmal A. fib on Eliquis, aortic valve disease presented with dizziness, lightheadedness and syncope.  Patient woke up in the morning on admission, felt dizzy and lightheaded.  She has chronic history of intermittent vertigo but this episode was different.  Patient was noticed to have A. fib with RVR with heart rate up to 140s to 150s, hypotensive reportedly BP was 50s/30s.  Per patient, she passed out when tried to stand up. She was given 500 cc of normal saline bolus and 12.5 mg metoprolol.  Admitted for further work-up.  CT head negative for acute intracranial abnormalities.  Cardiology was consulted   Assessment & Plan    Principal Problem:   Syncope: Likely due to A. fib with RVR and hypotension -BP was noted to be 50s/30s.  Received IV fluid hydration, BP now stable. -Does not feel dizzy or lightheaded.  Follow orthostatic vital signs -Continue to hold home HCTZ and Cozaar. -Cardiology consulted.  Follow 2D echo -Recommended home health, tub shower seat  Active Problems:   Labile hypertension -BP now elevated, home HCTZ and Cozaar held due to hypotension -Continue metoprolol and hydralazine IV as needed with parameters, cardiology following - will resume Cozaar at the time of discharge  Paroxysmal, atrial fibrillation with RVR ([place]) -Now converted  to normal sinus rhythm -Continue metoprolol, rate currently controlled -CHADS2VASc 5, continue eliquis    Gout -Continue allopurinol  Moderate aortic stenosis -Follow 2D echo    Hypothyroidism -Continue levothyroxine  Demand ischemia with mildly elevated troponin, likely secondary to atrial fibrillation with RVR -Currently no chest pain or shortness of breath. -Continue metoprolol, eliquis -Follow 2D echo  Aneurysmal dilatation of ascending aorta -Noted 4.0 cm diameter by CT angiogram -Will need close follow-up by PCP   Obesity Estimated body mass index is 38.99 kg/m as calculated from the following:   Height as of this encounter: 5\' 2"  (1.575 m).   Weight as of this encounter: 96.7 kg.  Code Status: Full CODE STATUS DVT Prophylaxis:   apixaban (ELIQUIS) tablet 5 mg   Level of Care: Level of care: Progressive Cardiac Family Communication: Discussed all imaging results, lab results, explained to the patient and patient's 2 daughters at the bedside   Disposition Plan:     Status is: Observation  The patient remains OBS appropriate and will d/c before 2 midnights.  Dispo: The patient is from: Home  Anticipated d/c is to: Home              Patient currently is not medically stable to d/c.  Awaiting 2D echo.  If EF is preserved and not orthostatic, likely DC home later today.   Difficult to place patient No      Time Spent in minutes 35 minutes  Procedures:  2D echo results pending  Consultants:   Cardiology  Antimicrobials:   Anti-infectives (From admission, onward)   None          Medications  Scheduled Meds: . allopurinol  300 mg Oral QODAY  . apixaban  5 mg Oral BID  . B-complex with vitamin C  1 tablet Oral Daily  . calcium-vitamin D  1 tablet Oral Daily  . cholecalciferol  1,000 Units Oral Daily  . fluticasone furoate-vilanterol  1 puff Inhalation Daily   And  . umeclidinium bromide  1 puff Inhalation Daily  . folic acid  1  mg Oral Daily  . gabapentin  600 mg Oral BID  . levothyroxine  50 mcg Oral Daily  . loratadine  10 mg Oral Daily  . [START ON 05/09/2020] methotrexate  12.5 mg Oral Weekly  . metoprolol succinate  25 mg Oral QHS  . multivitamin with minerals  1 tablet Oral Daily  . omega-3 acid ethyl esters  1 g Oral Daily  . oxybutynin  10 mg Oral Daily  . predniSONE  15 mg Oral Q breakfast  . psyllium  1 packet Oral Daily   Continuous Infusions: . sodium chloride 75 mL/hr at 05/08/20 1029   PRN Meds:.acetaminophen, albuterol, dextromethorphan-guaiFENesin, hydrALAZINE, ondansetron (ZOFRAN) IV      Subjective:   [name] was seen and examined today.  Currently no complaints of chest pain, dizziness, lightheadedness or shortness of breath. Patient denies  abdominal pain, N/V/D/C, new weakness, numbess, tingling. No acute events overnight.    Objective:   Vitals:   05/08/20 0513 05/08/20 0947 05/08/20 1034 05/08/20 1246  BP:  (!) 185/65 (!) 181/96 (!) 160/78  Pulse:  61 63 60  Resp:  18 18 18   Temp:  97.6 F (36.4 C)  97.8 F (36.6 C)  TempSrc:  Oral  Oral  SpO2:  97%  100%  Weight: 96.7 kg     Height:        Intake/Output Summary (Last 24 hours) at 05/08/2020 1534 Last data filed at 05/08/2020 1340 Gross per 24 hour  Intake 720 ml  Output 600 ml  Net 120 ml     Wt Readings from Last 3 Encounters:  05/08/20 96.7 kg  05/05/20 97.5 kg  02/05/20 95.3 kg     Exam  General: Alert and oriented x 3, NAD  Cardiovascular: S1 S2 auscultated, 2/6 systolic murmur RUSB  Respiratory: Clear to auscultation bilaterally  Gastrointestinal: Soft, nontender, nondistended, + bowel sounds  Ext: no pedal edema bilaterally  Neuro: no neuro deficit  Musculoskeletal: No digital cyanosis, clubbing  Skin: No rashes  Psych: Normal affect and demeanor, alert and oriented x3    Data Reviewed:  I have personally reviewed following labs and imaging studies  Micro Results Recent Results  (from the past 240 hour(s))  SARS CORONAVIRUS 2 (TAT 6-24 HRS) Nasopharyngeal Nasopharyngeal Swab     Status: None   Collection Time: 05/07/20  9:22 AM   Specimen: Nasopharyngeal Swab  Result Value Ref Range Status   SARS Coronavirus 2 NEGATIVE NEGATIVE Final    Comment: (NOTE) SARS-CoV-2 target nucleic acids  are NOT DETECTED.  The SARS-CoV-2 RNA is generally detectable in upper and lower respiratory specimens during the acute phase of infection. Negative results do not preclude SARS-CoV-2 infection, do not rule out co-infections with other pathogens, and should not be used as the sole basis for treatment or other patient management decisions. Negative results must be combined with clinical observations, patient history, and epidemiological information. The expected result is Negative.  Fact Sheet for Patients: SugarRoll.be  Fact Sheet for Healthcare Providers: https://www.woods-mathews.com/  This test is not yet approved or cleared by the [place] FDA and  has been authorized for detection and/or diagnosis of SARS-CoV-2 by FDA under an Emergency Use Authorization (EUA). This EUA will remain  in effect (meaning this test can be used) for the duration of the COVID-19 declaration under Se ction 564(b)(1) of the Act, 21 U.S.C. section 360bbb-3(b)(1), unless the authorization is terminated or revoked sooner.  Performed at [facility], [place] [address], [place], [place] [address]     Radiology Reports CT HEAD WO CONTRAST  Result Date: 05/07/2020 CLINICAL DATA:  Syncope. EXAM: CT HEAD WITHOUT CONTRAST TECHNIQUE: Contiguous axial images were obtained from the base of the skull through the vertex without intravenous contrast. COMPARISON:  01/09/2020 FINDINGS: Brain: No evidence of acute infarction, hemorrhage, hydrocephalus, extra-axial collection or mass lesion/mass effect. There is mild diffuse low-attenuation within the subcortical  and periventricular white matter compatible with chronic microvascular disease. Remote right basal ganglia lacunar infarct is again noted. Prominence of sulci and ventricles compatible with brain atrophy. Vascular: No hyperdense vessel or unexpected calcification. Skull: Normal. Negative for fracture or focal lesion. Sinuses/Orbits: No acute finding. Other: None IMPRESSION: 1. No acute intracranial abnormalities. 2. Chronic small vessel ischemic disease and brain atrophy. Electronically Signed   By: Kerby Moors M.D.   On: 05/07/2020 14:55   CT ANGIO CHEST PE W OR WO CONTRAST  Result Date: 05/07/2020 CLINICAL DATA:  Syncopal episode, normal neurological exam, tachycardia, atrial fibrillation, history hypertension, hypertension heart disease, asthma EXAM: CT ANGIOGRAPHY CHEST WITH CONTRAST TECHNIQUE: Multidetector CT imaging of the chest was performed using the standard protocol during bolus administration of intravenous contrast. Multiplanar CT image reconstructions and MIPs were obtained to evaluate the vascular anatomy. CONTRAST:  107mL OMNIPAQUE IOHEXOL 350 MG/ML SOLN IV COMPARISON:  CT chest 06/28/2005 FINDINGS: Cardiovascular: Scattered atherosclerotic calcifications aorta, great vessels, and coronary arteries. Aneurysmal dilatation ascending thoracic aorta 4.0 cm transverse image 39. Cardiac chambers minimally prominent. No pericardial effusion. Pulmonary arteries adequately opacified and patent. No evidence of pulmonary embolism. Mediastinum/Nodes: Esophagus unremarkable. Few normal size mediastinal lymph nodes. No thoracic adenopathy. Small nodule at inferior pole of RIGHT thyroid lobe 14 x 13 mm image 9, 11 x 9 mm in 2007; Not clinically significant; no follow-up imaging recommended (ref: J Am Coll Radiol. 2015 Feb;12(2): 143-50). Base of cervical region otherwise normal appearance. Lungs/Pleura: 6 mm anterior RIGHT upper lobe nodule image 44 unchanged. Dependent atelectasis in the lower lobes  bilaterally greater on RIGHT. Minimal peripheral atelectasis or scarring in both lungs. Nodular area of density at the lateral LEFT lung base 7 x 6 mm image 59, appears to represent atelectasis on sagittal images. No acute infiltrate, pleural effusion, or pneumothorax. Upper Abdomen: Food debris in stomach. Remaining visualized upper abdomen unremarkable Musculoskeletal: No acute osseous findings. Scattered degenerative disc disease changes thoracic spine. Review of the MIP images confirms the above findings. IMPRESSION: No evidence of pulmonary embolism. Scattered parenchymal lung scarring and dependent atelectasis at the lung  bases. Aneurysmal dilatation ascending thoracic aorta 4.0 cm diameter; Recommend annual imaging followup by CTA or MRA, if clinically indicated based on patient age and comorbidities. This recommendation follows 2010 ACCF/AHA/AATS/ACR/ASA/SCA/SCAI/SIR/STS/SVM Guidelines for the Diagnosis and Management of Patients with Thoracic Aortic Disease. Circulation. 2010; 121: X902-I097. Aortic aneurysm NOS (ICD10-I71.9) Aortic Atherosclerosis (ICD10-I70.0) and Aortic aneurysm NOS (ICD10-I71.9). Electronically Signed   By: Lavonia Dana M.D.   On: 05/07/2020 18:04   DG Chest Portable 1 View  Result Date: 05/07/2020 CLINICAL DATA:  Syncope.  Atrial fibrillation and hypertension EXAM: PORTABLE CHEST 1 VIEW COMPARISON:  12/19/2019 FINDINGS: Heart size within normal limits. Negative for heart failure. Mild left lower lobe atelectasis. Right lung clear. No edema or effusion. IMPRESSION: Mild left lower lobe atelectasis. Electronically Signed   By: Franchot Gallo M.D.   On: 05/07/2020 09:57    Lab Data:  CBC: Recent Labs  Lab 05/07/20 0918 05/08/20 0522  WBC 5.6 6.6  NEUTROABS 3.3  --   HGB 12.5 12.5  HCT 36.7 37.3  MCV 97.9 97.9  PLT 250 353   Basic Metabolic Panel: Recent Labs  Lab 05/07/20 0918 05/08/20 0522  NA 137 139  K 3.5 3.7  CL 106 106  CO2 23 26  GLUCOSE 112* 104*  BUN  13 15  CREATININE 0.65 0.46  CALCIUM 8.6* 8.6*  MG 1.6* 2.0   GFR: Estimated Creatinine Clearance: 47.6 mL/min (by C-G formula based on SCr of 0.46 mg/dL). Liver Function Tests: No results for input(s): AST, ALT, ALKPHOS, BILITOT, PROT, ALBUMIN in the last 168 hours. No results for input(s): LIPASE, AMYLASE in the last 168 hours. No results for input(s): AMMONIA in the last 168 hours. Coagulation Profile: No results for input(s): INR, PROTIME in the last 168 hours. Cardiac Enzymes: No results for input(s): CKTOTAL, CKMB, CKMBINDEX, TROPONINI in the last 168 hours. BNP (last 3 results) No results for input(s): PROBNP in the last 8760 hours. HbA1C: Recent Labs    05/08/20 0522  HGBA1C 5.5   CBG: No results for input(s): GLUCAP in the last 168 hours. Lipid Profile: Recent Labs    05/08/20 0522  CHOL 199  HDL 59  LDLCALC 122*  TRIG 88  CHOLHDL 3.4   Thyroid Function Tests: Recent Labs    05/08/20 0522  TSH 1.024   Anemia Panel: No results for input(s): VITAMINB12, FOLATE, FERRITIN, TIBC, IRON, RETICCTPCT in the last 72 hours. Urine analysis:    Component Value Date/Time   COLORURINE YELLOW 05/20/2009 1356   APPEARANCEUR CLEAR 05/20/2009 1356   LABSPEC 1.011 05/20/2009 1356   PHURINE 6.0 05/20/2009 1356   GLUCOSEU NEGATIVE 05/20/2009 1356   HGBUR SMALL (A) 05/20/2009 1356   BILIRUBINUR NEGATIVE 05/20/2009 [place] 05/20/2009 1356   PROTEINUR NEGATIVE 05/20/2009 1356   UROBILINOGEN 1.0 05/20/2009 1356   NITRITE NEGATIVE 05/20/2009 [place] 05/20/2009 1356     Shaunna Rosetti M.D. [facility] 05/08/2020, 3:34 PM  Available via Epic secure chat 7am-7pm After 7 pm, please refer to night coverage provider listed on amion.

## 2020-05-08 NOTE — Progress Notes (Signed)
Pt BP elevated this morning, PRN 5mg  hydralazine administered. Recheck at 1245, BP 160/78. When being evaluated by PT, pts BP was again elevated. MD made aware. MD increased hydralazine PRN dose to 10 mg. Hydralazine was administered per orders. BP rechecked at 1837, BP 166/76. Will continue to monitor. Pt resting comfortably in recliner, alert and oriented x 4. Family at bedside.

## 2020-05-08 NOTE — Telephone Encounter (Signed)
Per daughter patient currently admitted .

## 2020-05-08 NOTE — Consult Note (Signed)
Cardiology Consultation:   Patient ID: Jessica Kaufman; [id number]; 01-10-1925   Admit date: 05/07/2020 Date of Consult: 05/08/2020  Primary Care Provider: Leonides Sake, MD Primary Cardiologist: End Primary Electrophysiologist:  None   Patient Profile:   Jessica Kaufman is a 87 y.o. female with a hx of PAF on Eliquis, mild to moderate aortic stenosis, labile hypertension, PVCs, dizziness with presyncope, HLD, falls, and RA who is being seen today for the evaluation of syncope at the request of Dr. Blaine Hamper.  History of Present Illness:   Ms. Mahala underwent Zio patch in 02/2019 in the setting of dizziness with presyncope that showed a predominant rhythm of sinus with an average rate of 62 bpm range 41 to 107 bpm in sinus, rare PACs and PVCs, 39 episodes of paroxysmal SVT lasting up to 10 beats with a maximum rate of 184 bpm, and brief episodes of junctional rhythm often in the setting of PACs and PVCs.  Patient triggered events corresponded to sinus rhythm, PACs, and PVCs with transient junctional rhythm.  Echo in 03/2019 showed an EF of 55 to 60%, no regional wall motion abnormalities, normal LV diastolic function parameters, normal RV systolic function and ventricular cavity size, trivial mitral regurgitation, moderate aortic valve stenosis, and an estimated right atrial pressure of 3 mmHg.  She was last seen in the office in 02/2020 and noted falls over the preceding month in the context of losing her balance.  There was no frank syncope.  For this episode she was seen in the ED without evidence of significant trauma she had another episode of lightheadedness/presyncope with BP by her home cuff of 87/74.  EMS was contacted and they found her blood pressure to be low normal with a gradual rise over 7 minutes.  She was not evaluated in the ED.  On 5/4, she indicates she was feeling generalized malaise and fatigue for most of the day, which has happened in the past. She was otherwise  without complaints. On the morning of 5/5, she continued to note this same fatigue and sat down to rest after letting her cats in to eat. Typically, when she does this her symptoms improve. She took her BP and it was around [id number] mmHg systolic. Her heart rate was in the 120s bpm, typically in the 50s to 60s bpm. Due to persisting symptoms, she called her daughter who called EMS. The patient denied any chest pain, dyspnea, or palpitations in this setting. Upon EMS arrival, they stood her up and this is when she had a syncopal episode. Upon laying down on the stretcher, and with leg elevation, she regained consciousness and was without complaint outside of fatigue.    Initial BP stable in the [id number] systolic. Work-up in the ED included a chest x-ray that showed mild left lower lobe atelectasis, CT head without acute intracranial abnormalities with chronic small vessel ischemic disease and brain atrophy noted, CTA chest without evidence of PE, aneurysmal dilatation of the ascending thoracic aorta measuring 4 cm, and scattered parenchymal lung scarring along with dependent atelectasis of the lung bases.  Initial high-sensitivity troponin 58 with a delta of 64 subsequently trending to 96.  BNP 179.  COVID-negative.  Hgb 12.5.  Magnesium 1.6-->2.0.  TSH normal.  Potassium 3.5-->3.7, BUN/SCr 13/0.65-->15/0.46, glucose 112. Initial EKG showed Afib with RVR with ventricular rate of 111 bpm with diffuse nonspecific st/t changes. Repeat EKG on 5/5 showed she had converted to sinus rhythm. In sinus rhythm, her heart rates  have predominantly been in the 50s to 60s bpm. BP has largely been running in the 500X to 381W systolic. She was given IV magnesium and started on IV fluids. Orthostatic vitals pending. Telemetry has demonstrated sinus rhythm. Currently, asymptomatic.   Past Medical History:  Diagnosis Date  . Aortic valve disease   . Asthma   . Cancer ([place])    basal cell   . Gout   . Hyperlipidemia   . Hypertension    . Hypertensive heart disease without heart failure   . Hypothyroidism   . PAF (paroxysmal atrial fibrillation) ([place])   . PVC (premature ventricular contraction)   . Rheumatoid arthritis ([place])   . Ventricular premature depolarization     Past Surgical History:  Procedure Laterality Date  . BLADDER SURGERY  10/19/2010  . BONY PELVIS SURGERY  1990  . BREAST BIOPSY Left 1986  . CARDIAC SURGERY  2009   PVC  . CATARACT EXTRACTION Left 11/15/00  . CATARACT EXTRACTION Right   . ELBOW SURGERY  06/1990  . HIP SURGERY Left 05/28/09  . HIP SURGERY Right 06/30/03  . HYSTEROSCOPY  06/17/1986   vaginal     Home Meds: Prior to Admission medications   Medication Sig Start Date End Date Taking? Authorizing Provider  acetaminophen (TYLENOL) 650 MG CR tablet Take 650 mg by mouth every 8 (eight) hours as needed for pain.   Yes [provider]  allopurinol (ZYLOPRIM) 300 MG tablet Take 1 tablet by mouth every other day.  03/23/13  Yes [provider]  amoxicillin (AMOXIL) 500 MG tablet Take 2,000 mg by mouth 2 (two) times daily. 09/21/18  Yes [provider]  apixaban (ELIQUIS) 5 MG TABS tablet Take 5 mg by mouth 2 (two) times daily.    Yes [provider]  b complex vitamins tablet Take 1 tablet by mouth daily.   Yes [provider]  Bilberry, Vaccinium myrtillus, 1000 MG CAPS Take 2 capsules by mouth daily.   Yes [provider]  Bioflavonoid Products (BIOFLEX) TABS Take 2 tablets by mouth daily.   Yes [provider]  Calcium Carb-Cholecalciferol (OYSTER SHELL CALCIUM/VITAMIN D PO) daily.  12/03/13  Yes [provider]  Cholecalciferol (VITAMIN D3) 1000 UNITS CAPS Take 1 capsule by mouth daily.   Yes [provider]  Coenzyme Q10 (COQ10) 100 MG CAPS Take 1 capsule by mouth daily.   Yes [provider]  fexofenadine (ALLEGRA) 180 MG tablet Take 180 mg by mouth daily.   Yes [provider]   Fluticasone-Umeclidin-Vilant (TRELEGY ELLIPTA) 100-62.5-25 MCG/INH AEPB Inhale 100 mcg into the lungs daily. Patient taking differently: Inhale 1 puff into the lungs daily. 05/05/20  Yes Tyler Pita, MD  folic acid (FOLVITE) 1 MG tablet Take by mouth daily.    Yes [provider]  gabapentin (NEURONTIN) 600 MG tablet Take 600 mg by mouth 2 (two) times daily. 01/27/20  Yes [provider]  Ginger, Zingiber officinalis, (GINGER ROOT PO) Take by mouth daily.   Yes [provider]  hydrochlorothiazide (HYDRODIURIL) 25 MG tablet Take 1 tablet (25 mg total) by mouth daily. 12/03/19 03/02/20 Yes End, Harrell Gave, MD  levothyroxine (SYNTHROID, LEVOTHROID) 50 MCG tablet Take 1 tablet by mouth daily. 06/10/13  Yes [provider]  losartan (COZAAR) 50 MG tablet Take 1 tablet by mouth twice a day 03/31/20  Yes End, Harrell Gave, MD  methotrexate (RHEUMATREX) 2.5 MG tablet Take 12.5 mg by mouth once a week. Takes 5 tablets weekly  on Saturday 10/09/17  Yes [provider]  metoprolol succinate (TOPROL-XL) 25 MG 24 hr tablet Take 1 tablet (25 mg total) by mouth at bedtime. 12/03/19  Yes End, Harrell Gave, MD  Multiple Vitamins-Minerals (CENTRUM SILVER PO) Take 1 tablet by mouth daily.   Yes [provider]  Multiple Vitamins-Minerals (PRESERVISION AREDS 2 PO) Take 1 capsule by mouth 2 (two) times daily.    Yes [provider]  Omega-3 Fatty Acids (FISH OIL) 1000 MG CAPS Take 2 capsules by mouth daily.   Yes [provider]  oxybutynin (DITROPAN-XL) 10 MG 24 hr tablet Take 10 mg by mouth daily. 03/24/17  Yes [provider]  predniSONE (DELTASONE) 5 MG tablet Take 5 mg by mouth daily with breakfast.   Yes [provider]  psyllium (METAMUCIL) 58.6 % powder Take 1 packet by mouth daily.   Yes [provider]  TURMERIC PO Take by mouth daily.   Yes [provider]  CRANBERRY PO Take 1 tablet by mouth  daily. Patient not taking: No sig reported    [provider]  PREMARIN vaginal cream Place 1 Applicatorful vaginally in the morning and at bedtime.  10/29/18   [provider]    Inpatient Medications: Scheduled Meds: . allopurinol  300 mg Oral QODAY  . apixaban  5 mg Oral BID  . B-complex with vitamin C  1 tablet Oral Daily  . calcium-vitamin D  1 tablet Oral Daily  . cholecalciferol  1,000 Units Oral Daily  . fluticasone furoate-vilanterol  1 puff Inhalation Daily   And  . umeclidinium bromide  1 puff Inhalation Daily  . folic acid  1 mg Oral Daily  . gabapentin  600 mg Oral BID  . levothyroxine  50 mcg Oral Daily  . loratadine  10 mg Oral Daily  . [START ON 05/09/2020] methotrexate  12.5 mg Oral Weekly  . metoprolol succinate  25 mg Oral QHS  . multivitamin with minerals  1 tablet Oral Daily  . omega-3 acid ethyl esters  1 g Oral Daily  . oxybutynin  10 mg Oral Daily  . predniSONE  15 mg Oral Q breakfast  . psyllium  1 packet Oral Daily   Continuous Infusions: . sodium chloride 75 mL/hr at 05/07/20 2206   PRN Meds: acetaminophen, albuterol, dextromethorphan-guaiFENesin, hydrALAZINE, ondansetron (ZOFRAN) IV  Allergies:   Allergies  Allergen Reactions  . Metronidazole Other (See Comments)    RASH  . Sulindac Other (See Comments)    UNKNOWN  . Naproxen Rash    Social History:   Social History   Socioeconomic History  . Marital status: Widowed    Spouse name: Not on file  . Number of children: 7  . Years of education: 12th  . Highest education level: Not on file  Occupational History  . Occupation: Retired  Tobacco Use  . Smoking status: Never Smoker  . Smokeless tobacco: Never Used  Vaping Use  . Vaping Use: Never used  Substance and Sexual Activity  . Alcohol use: No  . Drug use: No  . Sexual activity: Not on file  Other Topics Concern  . Not on file  Social History Narrative   Patient lives at home alone.   Caffeine Use: 1 cup  daily   7 children (1 deceased)   Social Determinants of Health   Financial Resource Strain: Not on file  Food Insecurity: Not on file  Transportation Needs: Not on file  Physical Activity: Not on file  Stress: Not on file  Social Connections: Not on file  Intimate Partner Violence: Not on file     Family History:   Family History  Problem Relation Age of Onset  . Stroke Mother   . Melanoma Mother   . Heart attack Father   . Uterine cancer Sister   . Melanoma Sister   . Heart failure Sister   . Heart attack Maternal Grandmother   . Breast cancer Daughter     ROS:  Review of Systems  Constitutional: Positive for malaise/fatigue. Negative for chills, diaphoresis, fever and weight loss.  HENT: Negative for congestion.   Eyes: Negative for discharge and redness.  Respiratory: Negative for cough, sputum production, shortness of breath and wheezing.   Cardiovascular: Negative for chest pain, palpitations, orthopnea, claudication, leg swelling and PND.  Gastrointestinal: Negative for abdominal pain, blood in stool, heartburn, melena, nausea and vomiting.  Musculoskeletal: Positive for falls. Negative for myalgias.  Skin: Negative for rash.  Neurological: Positive for dizziness, loss of consciousness and weakness. Negative for tingling, tremors, sensory change, speech change and focal weakness.  Endo/Heme/Allergies: Does not bruise/bleed easily.  Psychiatric/Behavioral: Negative for substance abuse. The patient is not nervous/anxious.   All other systems reviewed and are negative.     Physical Exam/Data:   Vitals:   05/07/20 1927 05/08/20 0208 05/08/20 0251 05/08/20 0513  BP: (!) 154/69 (!) 176/69 (!) 160/72   Pulse: 66 (!) 58 (!) 57   Resp: 18 16 18    Temp: 98.6 F (37 C) 97.6 F (36.4 C) 97.6 F (36.4 C)   TempSrc: Oral Oral Oral   SpO2: 97% 95% 98%   Weight:    96.7 kg  Height:        Intake/Output Summary (Last 24 hours) at 05/08/2020 0924 Last data filed at  05/08/2020 0700 Gross per 24 hour  Intake 240 ml  Output 600 ml  Net -360 ml   Filed Weights   05/07/20 0926 05/07/20 1856 05/08/20 0513  Weight: 94.8 kg 97 kg 96.7 kg   Body mass index is 38.99 kg/m.   Physical Exam: General: Well developed, well nourished, in no acute distress. Head: Normocephalic, atraumatic, sclera non-icteric, no xanthomas, nares without discharge.  Neck: Negative for carotid bruits. JVD not elevated. Lungs: Diminished breath sounds along the bilateral bases. Breathing is unlabored. Heart: RRR with S1 S2. II/VI systolic murmur RUSB, no rubs, or gallops appreciated. Abdomen: Soft, non-tender, non-distended with normoactive bowel sounds. No hepatomegaly. No rebound/guarding. No obvious abdominal masses. Msk:  Strength and tone appear normal for age. Extremities: No clubbing or cyanosis. No edema. Distal pedal pulses are 2+ and equal bilaterally. Neuro: Alert and oriented X 3. No facial asymmetry. No focal deficit. Moves all extremities spontaneously. Psych:  Responds to questions appropriately with a normal affect.   EKG:  The EKG was personally reviewed and demonstrates: 05/07/2020, 09:14 - Afib with RVR, 111 bpm, diffuse nonspecific st/t changes. Repeat EKG 05/07/2020, 10:36 - NSR, 1st degree AV block, nonspecific st/t changes Telemetry:  Telemetry was personally reviewed and demonstrates: SR  Weights: [employer]   05/07/20 0926 05/07/20 1856 05/08/20 0513  Weight: 94.8 kg 97 kg 96.7 kg    Relevant CV Studies:  2D echo 03/2019: 1. Left ventricular ejection fraction, by estimation, is 55 to 60%. The  left ventricle has normal function. The left ventricle has no regional  wall motion abnormalities. Left ventricular diastolic parameters were  normal.  2. Right ventricular systolic function is normal. The right  ventricular  size is normal.  3. The mitral valve is grossly normal. Trivial mitral valve  regurgitation.  4. The aortic valve was not well  visualized. Aortic valve regurgitation  is not visualized. Moderate aortic valve stenosis.  5. The inferior vena cava is normal in size with greater than 50%  respiratory variability, suggesting right atrial pressure of 3 mmHg. __________  Elwyn Reach patch 02/2019:  The patient was monitored for 13 days, 23 hours.  The predominant rhythm was sinus with an average rate of 62 bpm (range 41 to 107 bpm in sinus).  Rare PACs and PVCs occurred.  There were 39 episodes of paroxysmal supraventricular tachycardia lasting up to 10 beats with a maximum rate of 184 bpm.  Brief episodes of junctional rhythm occurred, often in the setting of PACs and PVCs.  Patient triggered events correspond to sinus rhythm, PACs, and PVCs transient junctional rhythm.   Predominately sinus rhythm with rare PACs and PVCs.  39 episodes of brief PSVT occurred, as well as transient junctional rhythm (often in the setting of PACs and PVCs).   Laboratory Data:  Chemistry Recent Labs  Lab 05/07/20 0918 05/08/20 0522  NA 137 139  K 3.5 3.7  CL 106 106  CO2 23 26  GLUCOSE 112* 104*  BUN 13 15  CREATININE 0.65 0.46  CALCIUM 8.6* 8.6*  GFRNONAA >60 >60  ANIONGAP 8 7    No results for input(s): PROT, ALBUMIN, AST, ALT, ALKPHOS, BILITOT in the last 168 hours. Hematology Recent Labs  Lab 05/07/20 0918 05/08/20 0522  WBC 5.6 6.6  RBC 3.75* 3.81*  HGB 12.5 12.5  HCT 36.7 37.3  MCV 97.9 97.9  MCH 33.3 32.8  MCHC 34.1 33.5  RDW 14.5 14.5  PLT 250 251   Cardiac EnzymesNo results for input(s): TROPONINI in the last 168 hours. No results for input(s): TROPIPOC in the last 168 hours.  BNP Recent Labs  Lab 05/07/20 0940  BNP 179.8*    DDimer No results for input(s): DDIMER in the last 168 hours.  Radiology/Studies:  CT HEAD WO CONTRAST  Result Date: 05/07/2020 IMPRESSION: 1. No acute intracranial abnormalities. 2. Chronic small vessel ischemic disease and brain atrophy. Electronically Signed   By: Kerby Moors M.D.   On: 05/07/2020 14:55   CT ANGIO CHEST PE W OR WO CONTRAST  Result Date: 05/07/2020 IMPRESSION: No evidence of pulmonary embolism. Scattered parenchymal lung scarring and dependent atelectasis at the lung bases. Aneurysmal dilatation ascending thoracic aorta 4.0 cm diameter; Recommend annual imaging followup by CTA or MRA, if clinically indicated based on patient age and comorbidities. This recommendation follows 2010 ACCF/AHA/AATS/ACR/ASA/SCA/SCAI/SIR/STS/SVM Guidelines for the Diagnosis and Management of Patients with Thoracic Aortic Disease. Circulation. 2010; [id number]. Aortic aneurysm NOS (ICD10-I71.9) Aortic Atherosclerosis (ICD10-I70.0) and Aortic aneurysm NOS (ICD10-I71.9). Electronically Signed   By: Lavonia Dana M.D.   On: 05/07/2020 18:04   DG Chest Portable 1 View  Result Date: 05/07/2020 IMPRESSION: Mild left lower lobe atelectasis. Electronically Signed   By: Franchot Gallo M.D.   On: 05/07/2020 09:57    Assessment and Plan:   1. Syncope: -Occurred in the context of positional change when going from sitting to standing with EMS after being called for generalized weakness -Agree with holding of HCTZ and losartan for now -Would look to attempt to avoid diuretics -Check orthostatic vital signs -She will need permissive hypertension  -Echo pending -Monitor on telemetry   2. Elevated high sensitivity troponin: -Mildly elevated, not consistent with ACS  at this time -Trend to peak -No indication for heparin gtt at this time -Check echo -If echo demonstrates preserved LVSF and troponin remains flat trending will plan for outpatient ischemic testing, if echo is abnormal or there is significant troponin elevation she would require inpatient evaluation   3. Labile hypertension: -She will need some degree of permissive hypertension -Agree with holding HCTZ and losartan -Continue metoprolol given underlying PAF  4. Moderate aortic stenosis: -Not previously felt to  be contributing to her presyncope -Check echo  5. PAF: -Episodes appear to be relatively infrequent  -Appears her symptoms when in Afib are fatigue and generalized weakness, which may have contributed to her presentation this admission -While in sinus, her rates typically run in the 50s to 60s bpm, which precludes escalation of beta blocker -If episodes become more frequent, could use amiodarone  -Eliquis -CHADS2VASc 5 (HTN, age x 2, vascular disease, sex category)   For questions or updates, please contact [facility] Please consult [email or web address] for contact info under Cardiology/STEMI.   Signed, Christell Faith, PA-C [place] Pager: [phone] 05/08/2020, 9:24 AM

## 2020-05-08 NOTE — Telephone Encounter (Signed)
Spoke with patients daughter per release form. She states that provider is with them now. No further questions for now.

## 2020-05-08 NOTE — Evaluation (Addendum)
Occupational Therapy Evaluation Patient Details Name: Jessica Kaufman MRN: [id number] DOB: July 06, 1925 Today's Date: 05/08/2020    History of Present Illness Pt is a 87 y/o F with PMH: PAF on Eliquis, mild to moderate aortic stenosis, labile hypertension, PVCs, dizziness with presyncope, HLD, falls, and RA who presented to ED d/t syncopal episode. Pt was in Afib wtih RVR on arrival.   Clinical Impression   Pt seen for OT evaluation this date in setting of acute hospitalization d/t syncopal episode. Pt reports living alone in multi level home, but able to stay on main level with BR/BA. Pt and her dtr who is present throughout report that pt no longer drives d/t vision and that her children alternate transportation and getting groceries. Pt reports being INDEP/MOD I with self care at baseline and using 2WW for fxl mobility both inside and outside of the home. On assessment, pt requires SETUP to MIN A for seated UB ADLs, MOD A for standing LB ADLs such as peri care and LB bathing. MAX A for seated threading socks/shoes (reports wearing slip on shoes and rarely socks at baseline d/t difficulty). Pt primarily requiring increased assistance versus baseline this date 2/2 decreased fxl activity tolerance, decreased strength and fear of falling. Pt will likely be safe to return to home environment upon d/c with family SUPV and will require [place] f/u to ensure adequate strength and safety with I/ADLs in the natural environment. Of note: orthostatic BP taken as pt c/o dizziness. Results as follows: supine 181/96 (MAP 120) with HR 63 bpm, sitting 179/89 (MAP 116) with HR 67 bpm, standing 190/98 (MAP 120) with HR 71 bpm (RN NOTIFIED OF FINDINGS, pt is neg for orthostatic BP).     Follow Up Recommendations  Home health OT;Supervision - Intermittent    Equipment Recommendations  Tub/shower seat    Recommendations for Other Services       Precautions / Restrictions Precautions Precautions:  Fall Restrictions Weight Bearing Restrictions: No Other Position/Activity Restrictions: monitor BP (high during assessment, but pt with dizziness/light-headedness).      Mobility Bed Mobility Overal bed mobility: Needs Assistance Bed Mobility: Supine to Sit     Supine to sit: Min guard;HOB elevated          Transfers Overall transfer level: Needs assistance Equipment used: Rolling walker (2 wheeled) Transfers: Sit to/from [facility] Sit to Stand: Min assist Stand pivot transfers: Min assist       General transfer comment: increased time, UE Support on RW    Balance Overall balance assessment: Needs assistance Sitting-balance support: Feet supported Sitting balance-Leahy Scale: Good     Standing balance support: Bilateral upper extremity supported Standing balance-Leahy Scale: Fair Standing balance comment: reliant on UE support, cannot tolerate challenge                           ADL either performed or assessed with clinical judgement   ADL Overall ADL's : Needs assistance/impaired                                       General ADL Comments: SETUP to MIN A for seated UB ADLs, MOD A for standing LB ADLs such as peri care and LB bathing. MAX A for seated threading socks/shoes (reports wearing slip on shoes and rarely socks at baseline d/t difficulty)     Vision Baseline Vision/History:  Wears glasses Wears Glasses: At all times Patient Visual Report: No change from baseline       Perception     Praxis      Pertinent Vitals/Pain Pain Assessment: 0-10 Pain Score: 2  Pain Location: R shoulder "slept on it funny" Pain Descriptors / Indicators: Discomfort;Tightness Pain Intervention(s): Monitored during session;Repositioned     Hand Dominance Right   Extremity/Trunk Assessment Upper Extremity Assessment Upper Extremity Assessment: Overall WFL for tasks assessed;Generalized weakness (ROM WFL, MMT grossly 4-/5)    Lower Extremity Assessment Lower Extremity Assessment: Overall WFL for tasks assessed;Generalized weakness (limitd LB ROM, including decreased hip rotation impacting pt's ability to perform LB ADLs. Pt states she wears slip on shoes at baseline)       Communication Communication Communication: No difficulties   Cognition Arousal/Alertness: Awake/alert Behavior During Therapy: WFL for tasks assessed/performed Overall Cognitive Status: Within Functional Limits for tasks assessed                                     General Comments  BP taken as follows: supine 181/96 (120) with HR 63 bpm, sitting 179/89 (116) with HR 67 bpm, standing 190/98 (120) with HR 71 bpm (RN NOTIFIED OF FINDINGS)    Exercises Other Exercises Other Exercises: OT educates pt and dtr on safety considerations, fall prevention, use of call light, importance of OOB activity, role of OT. Pt and dtr with good understanding.   Shoulder Instructions      Home Living Family/patient expects to be discharged to:: Private residence Living Arrangements: Alone Available Help at Discharge: Family;Available PRN/intermittently (has 3 daughters, 2 primarily help patient) Type of Home: House       Home Layout: Multi-level;Able to live on main level with bedroom/bathroom Alternate Level Stairs-Number of Steps: 12 Alternate Level Stairs-Rails: Right;Left Bathroom Shower/Tub: Walk-in shower         Home Equipment: Environmental consultant - 2 wheels;Grab bars - tub/shower          Prior Functioning/Environment Level of Independence: Independent with assistive device(s);Needs assistance  Gait / Transfers Assistance Needed: 2WW for fxl mobility both inside/outside ADL's / Homemaking Assistance Needed: Able to perform all basic self care I'ly and even light meal prep. States her daughters help her with transportation and getting groceries d/t vision loss. Pt endorses that cooking has become more difficult in last year.             OT Problem List: Decreased strength;Decreased activity tolerance;Impaired balance (sitting and/or standing);Cardiopulmonary status limiting activity;Pain      OT Treatment/Interventions: Self-care/ADL training;DME and/or AE instruction;Therapeutic activities;Balance training;Therapeutic exercise;Energy conservation;Patient/family education    OT Goals(Current goals can be found in the care plan section) Acute Rehab OT Goals Patient Stated Goal: to get stronger and be able to continue living in my home OT Goal Formulation: With patient Time For Goal Achievement: 05/22/20 Potential to Achieve Goals: Good ADL Goals Pt Will Perform Grooming: with supervision;standing (with LRAD, sink-side to complete 2-3 g/h tasks) Pt Will Perform Lower Body Bathing: with supervision;sit to/from stand (with LRAD, with use of modified technique, with shower chair PRN) Pt Will Transfer to Toilet: with supervision;ambulating (with LRAD to restroom) Pt Will Perform Toileting - Clothing Manipulation and hygiene: with supervision;sit to/from stand Pt Will Perform Tub/Shower Transfer: with supervision;ambulating;shower seat;rolling walker;grab bars Pt/caregiver will Perform Home Exercise Program: Increased strength;Both right and left upper extremity;With minimal assist  OT Frequency: Min 1X/week  Barriers to D/C: Decreased caregiver support          Co-evaluation              AM-PAC OT "6 Clicks" Daily Activity     Outcome Measure Help from another person eating meals?: None Help from another person taking care of personal grooming?: A Little Help from another person toileting, which includes using toliet, bedpan, or urinal?: A Little Help from another person bathing (including washing, rinsing, drying)?: A Lot Help from another person to put on and taking off regular upper body clothing?: None Help from another person to put on and taking off regular lower body clothing?: A Lot 6 Click  Score: 18   End of Session Equipment Utilized During Treatment: Gait belt;Rolling walker Nurse Communication: Mobility status  Activity Tolerance: Patient tolerated treatment well Patient left: in chair;with call bell/phone within reach;with family/visitor present  OT Visit Diagnosis: Unsteadiness on feet (R26.81);Muscle weakness (generalized) (M62.81)                Time: 9833-8250 OT Time Calculation (min): 46 min Charges:  OT General Charges $OT Visit: 1 Visit OT Evaluation $OT Eval Moderate Complexity: 1 Mod OT Treatments $Self Care/Home Management : 23-37 mins $Therapeutic Activity: 8-22 mins  Gerrianne Scale, MS, OTR/L ascom [phone] 05/08/20, 3:25 PM

## 2020-05-08 NOTE — Progress Notes (Signed)
PT Cancellation Note  Patient Details Name: Jessica Kaufman MRN: [id number] DOB: 1925-07-14   Cancelled Treatment:    Reason Eval/Treat Not Completed: Medical issues which prohibited therapy.  PT consult received.  Chart reviewed.  Pt resting in recliner upon PT arrival.  BP checked and noted to be 184/82 seated (HR 67 bpm).  Nurse notified immediately regarding elevated BP.  Therapy session held d/t elevated BP at rest.  Will re-attempt PT evaluation at a later date/time as medically appropriate.  Leitha Bleak, PT 05/08/20, 4:12 PM

## 2020-05-08 NOTE — Care Management Obs Status (Signed)
[facility] NOTIFICATION   Patient Details  Name: Jessica Kaufman MRN: [id number] Date of Birth: 18-May-1925   Medicare Observation Status Notification Given:  Yes    Beverly Sessions, RN 05/08/2020, 3:16 PM

## 2020-05-08 NOTE — Progress Notes (Signed)
*  PRELIMINARY RESULTS* Echocardiogram 2D Echocardiogram has been performed.  Sherrie Sport 05/08/2020, 12:43 PM

## 2020-05-09 DIAGNOSIS — R55 Syncope and collapse: Secondary | ICD-10-CM | POA: Diagnosis not present

## 2020-05-09 DIAGNOSIS — M1000 Idiopathic gout, unspecified site: Secondary | ICD-10-CM

## 2020-05-09 DIAGNOSIS — R778 Other specified abnormalities of plasma proteins: Secondary | ICD-10-CM | POA: Diagnosis not present

## 2020-05-09 DIAGNOSIS — I4891 Unspecified atrial fibrillation: Secondary | ICD-10-CM | POA: Diagnosis not present

## 2020-05-09 MED ORDER — LOSARTAN POTASSIUM 50 MG PO TABS: 50.0000 mg | ORAL_TABLET | Freq: Two times a day (BID) | ORAL | Status: DC

## 2020-05-09 MED ORDER — AMOXICILLIN 500 MG PO TABS
2000.0000 mg | ORAL_TABLET | Freq: Two times a day (BID) | ORAL | Status: DC
Start: 2020-05-09 — End: 2022-11-28

## 2020-05-09 NOTE — Progress Notes (Signed)
Discharge instructions explained to pt and daughter/both verbalized understanding. IV and tele removed. Will transport off unit via wheelchair.

## 2020-05-09 NOTE — Discharge Summary (Signed)
Physician Discharge Summary   Patient ID: JORETTA EADS MRN: [id number] DOB/AGE: [birth year] 87 y.o.  Admit date: 05/07/2020 Discharge date: 05/09/2020  Primary Care Physician:  Leonides Sake, MD   Recommendations for Outpatient Follow-up:  1. Follow up with PCP in 1-2 weeks 2. HCTZ discontinued 3. Aneurysmal dilatation of ascending aorta -Noted 4.0 cm diameter by CT angiogram, needs outpatient follow-up  Home Health: Home health PT Equipment/Devices:   Discharge Condition: stable  CODE STATUS: FULL  Diet recommendation: Heart healthy diet   Discharge Diagnoses:      Syncope . Atrial fibrillation with RVR (HCC) with hypotension . Labile hypertension . Gout . Hypothyroidism . Rheumatoid arthritis ([place]) . Obesity . Elevated troponin . Hypomagnesemia  Aortic stenosis  Aneurysmal dilatation of ascending aorta  Consults:  Cardiology     Allergies:   Allergies  Allergen Reactions  . Metronidazole Other (See Comments)    RASH  . Sulindac Other (See Comments)    UNKNOWN  . Naproxen Rash     DISCHARGE MEDICATIONS: Allergies as of 05/09/2020      Reactions   Metronidazole Other (See Comments)   RASH   Sulindac Other (See Comments)   UNKNOWN   Naproxen Rash      Medication List    STOP taking these medications   hydrochlorothiazide 25 MG tablet Commonly known as: HYDRODIURIL     TAKE these medications   acetaminophen 650 MG CR tablet Commonly known as: TYLENOL Take 650 mg by mouth every 8 (eight) hours as needed for pain.   allopurinol 300 MG tablet Commonly known as: ZYLOPRIM Take 1 tablet by mouth every other day.   amoxicillin 500 MG tablet Commonly known as: AMOXIL Take 4 tablets (2,000 mg total) by mouth 2 (two) times daily. For dental procedures only What changed: additional instructions   apixaban 5 MG Tabs tablet Commonly known as: ELIQUIS Take 5 mg by mouth 2 (two) times daily.   b complex vitamins tablet Take 1 tablet by mouth  daily.   Bilberry (Vaccinium myrtillus) 1000 MG Caps Take 2 capsules by mouth daily.   Bioflex Tabs Take 2 tablets by mouth daily.   PRESERVISION AREDS 2 PO Take 1 capsule by mouth 2 (two) times daily.   CENTRUM SILVER PO Take 1 tablet by mouth daily.   CoQ10 100 MG Caps Take 1 capsule by mouth daily.   CRANBERRY PO Take 1 tablet by mouth daily.   fexofenadine 180 MG tablet Commonly known as: ALLEGRA Take 180 mg by mouth daily.   Fish Oil 1000 MG Caps Take 2 capsules by mouth daily.   folic acid 1 MG tablet Commonly known as: FOLVITE Take by mouth daily.   gabapentin 600 MG tablet Commonly known as: NEURONTIN Take 600 mg by mouth 2 (two) times daily.   GINGER ROOT PO Take by mouth daily.   levothyroxine 50 MCG tablet Commonly known as: SYNTHROID Take 1 tablet by mouth daily.   losartan 50 MG tablet Commonly known as: COZAAR Take 1 tablet by mouth twice a day   methotrexate 2.5 MG tablet Commonly known as: RHEUMATREX Take 12.5 mg by mouth once a week. Takes 5 tablets weekly on Saturday   metoprolol succinate 25 MG 24 hr tablet Commonly known as: TOPROL-XL Take 1 tablet (25 mg total) by mouth at bedtime.   oxybutynin 10 MG 24 hr tablet Commonly known as: DITROPAN-XL Take 10 mg by mouth daily.   OYSTER SHELL CALCIUM/VITAMIN D PO daily.   predniSONE  5 MG tablet Commonly known as: DELTASONE Take 5 mg by mouth daily with breakfast.   Premarin vaginal cream Generic drug: conjugated estrogens Place 1 Applicatorful vaginally in the morning and at bedtime.   psyllium 58.6 % powder Commonly known as: METAMUCIL Take 1 packet by mouth daily.   Trelegy Ellipta 100-62.5-25 MCG/INH Aepb Generic drug: Fluticasone-Umeclidin-Vilant Inhale 100 mcg into the lungs daily. What changed: how much to take   TURMERIC PO Take by mouth daily.   Vitamin D3 25 MCG (1000 UT) Caps Take 1 capsule by mouth daily.            Durable Medical Equipment  (From  admission, onward)         Start     Ordered   05/08/20 1626  For home use only DME Tub bench  Once        05/08/20 1625           Brief H and P: For complete details please refer to admission H and P, but in brief  Patient is a a 87 year old female with history of hypertension, hyperlipidemia, asthma, hypothyroidism, gout, RA, paroxysmal A. fib on Eliquis, aortic valve disease presented with dizziness, lightheadedness and syncope.  Patient woke up in the morning on admission, felt dizzy and lightheaded.  She has chronic history of intermittent vertigo but this episode was different.  Patient was noticed to have A. fib with RVR with heart rate up to 140s to 150s, hypotensive reportedly BP was 50s/30s.  Per patient, she passed out when tried to stand up. She was given 500 cc of normal saline bolus and 12.5 mg metoprolol.  Admitted for further work-up.  CT head negative for acute intracranial abnormalities.  Cardiology was consulted  Hospital Course:   Syncope: Likely due to A. fib with RVR and hypotension -BP was noted to be 50s/30s.  Received IV fluid hydration, BP now stable. -Does not feel dizzy or lightheaded.  CT angiogram of the chest was negative for PE.  CT head negative for acute intracranial abnormalities. Initially HCTZ and Cozaar held, cardiology was consulted. -2D echo showed EF of 50 to 55%, moderate aortic valve stenosis. -Orthostatic vitals normal, Cozaar was resumed.  No significant arrhythmias -PT evaluation recommended home health PT,  tub shower seat    Labile hypertension -Initially home HCTZ and Cozaar held due to hypotension -Now resumed Cozaar, continue metoprolol  Paroxysmal, atrial fibrillation with RVR ([place]) -Initially had presented with atrial fibrillation with RVR and hypotension -Now converted to normal sinus rhythm -Continue metoprolol, rate currently controlled -CHADS2VASc 5, continue eliquis    Gout -Continue allopurinol  Moderate aortic  stenosis -2D echo showed moderate aortic valve stenosis, no significant changes    Hypothyroidism -Continue levothyroxine  Demand ischemia with mildly elevated troponin, likely secondary to atrial fibrillation with RVR -Currently no chest pain or shortness of breath. -Continue metoprolol, eliquis -2D echo showed EF 50 to 55%  Aneurysmal dilatation of ascending aorta -Noted 4.0 cm diameter by CT angiogram -Will need close follow-up by PCP   Obesity Estimated body mass index is 38.99 kg/m as calculated from the following:   Height as of this encounter: 5\' 2"  (1.575 m).   Weight as of this encounter: 96.7 kg.  Plan discussed with patient and daughter at the bedside, agreeable with the plan and discharge home today.  Day of Discharge S: Doing well, no acute complaints, tolerating solid diet without any difficulty.  BP now stable.  Cleared by cardiology to be  discharged home  BP (!) 160/72 (BP Location: Right Arm)   Pulse 63   Temp 98.8 F (37.1 C) (Oral)   Resp 18   Ht 5\' 2"  (1.575 m)   Wt 96.5 kg   SpO2 95%   BMI 38.92 kg/m   Physical Exam: General: Alert and awake oriented x3 not in any acute distress. CVS: S1-S2 clear, 2/6 systolic murmur RUSB Chest: clear to auscultation bilaterally, no wheezing rales or rhonchi Abdomen: soft nontender, nondistended, normal bowel sounds Extremities: no cyanosis, clubbing or edema noted bilaterally Neuro: no new deficits    Get Medicines reviewed and adjusted: Please take all your medications with you for your next visit with your Primary MD  Please request your Primary MD to go over all hospital tests and procedure/radiological results at the follow up. Please ask your Primary MD to get all Hospital records sent to his/her office.  If you experience worsening of your admission symptoms, develop shortness of breath, life threatening emergency, suicidal or homicidal thoughts you must seek medical attention immediately by  calling 911 or calling your MD immediately  if symptoms less severe.  You must read complete instructions/literature along with all the possible adverse reactions/side effects for all the Medicines you take and that have been prescribed to you. Take any new Medicines after you have completely understood and accept all the possible adverse reactions/side effects.   Do not drive when taking pain medications.   Do not take more than prescribed Pain, Sleep and Anxiety Medications  Special Instructions: If you have smoked or chewed Tobacco  in the last 2 yrs please stop smoking, stop any regular Alcohol  and or any Recreational drug use.  Wear Seat belts while driving.  Please note  You were cared for by a hospitalist during your hospital stay. Once you are discharged, your primary care physician will handle any further medical issues. Please note that NO REFILLS for any discharge medications will be authorized once you are discharged, as it is imperative that you return to your primary care physician (or establish a relationship with a primary care physician if you do not have one) for your aftercare needs so that they can reassess your need for medications and monitor your lab values.   The results of significant diagnostics from this hospitalization (including imaging, microbiology, ancillary and laboratory) are listed below for reference.      Procedures/Studies:  CT HEAD WO CONTRAST  Result Date: 05/07/2020 CLINICAL DATA:  Syncope. EXAM: CT HEAD WITHOUT CONTRAST TECHNIQUE: Contiguous axial images were obtained from the base of the skull through the vertex without intravenous contrast. COMPARISON:  01/09/2020 FINDINGS: Brain: No evidence of acute infarction, hemorrhage, hydrocephalus, extra-axial collection or mass lesion/mass effect. There is mild diffuse low-attenuation within the subcortical and periventricular white matter compatible with chronic microvascular disease. 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 Prominence of sulci and ventricles compatible with brain atrophy. Vascular: No hyperdense vessel or unexpected calcification. Skull: Normal. Negative for fracture or focal lesion. Sinuses/Orbits: No acute finding. Other: None IMPRESSION: 1. No acute intracranial abnormalities. 2. Chronic small vessel ischemic disease and brain atrophy. Electronically Signed   By: Kerby Moors M.D.   On: 05/07/2020 14:55   CT ANGIO CHEST PE W OR WO CONTRAST  Result Date: 05/07/2020 CLINICAL DATA:  Syncopal episode, normal neurological exam, tachycardia, atrial fibrillation, history hypertension, hypertension heart disease, asthma EXAM: CT ANGIOGRAPHY CHEST WITH CONTRAST TECHNIQUE: Multidetector CT imaging of the chest was performed using  the standard protocol during bolus administration of intravenous contrast. Multiplanar CT image reconstructions and MIPs were obtained to evaluate the vascular anatomy. CONTRAST:  94mL OMNIPAQUE IOHEXOL 350 MG/ML SOLN IV COMPARISON:  CT chest 06/28/2005 FINDINGS: Cardiovascular: Scattered atherosclerotic calcifications aorta, great vessels, and coronary arteries. Aneurysmal dilatation ascending thoracic aorta 4.0 cm transverse image 39. Cardiac chambers minimally prominent. No pericardial effusion. Pulmonary arteries adequately opacified and patent. No evidence of pulmonary embolism. Mediastinum/Nodes: Esophagus unremarkable. Few normal size mediastinal lymph nodes. No thoracic adenopathy. Small nodule at inferior pole of RIGHT thyroid lobe 14 x 13 mm image 9, 11 x 9 mm in 2007; Not clinically significant; no follow-up imaging recommended (ref: J Am Coll Radiol. 2015 Feb;12(2): 143-50). Base of cervical region otherwise normal appearance. Lungs/Pleura: 6 mm anterior RIGHT upper lobe nodule image 44 unchanged. Dependent atelectasis in the lower lobes bilaterally greater on RIGHT. Minimal peripheral atelectasis or scarring in both lungs. Nodular area of  density at the lateral LEFT lung base 7 x 6 mm image 59, appears to represent atelectasis on sagittal images. No acute infiltrate, pleural effusion, or pneumothorax. Upper Abdomen: Food debris in stomach. Remaining visualized upper abdomen unremarkable Musculoskeletal: No acute osseous findings. Scattered degenerative disc disease changes thoracic spine. Review of the MIP images confirms the above findings. IMPRESSION: No evidence of pulmonary embolism. Scattered parenchymal lung scarring and dependent atelectasis at the lung bases. Aneurysmal dilatation ascending thoracic aorta 4.0 cm diameter; Recommend annual imaging followup by CTA or MRA, if clinically indicated based on patient age and comorbidities. This recommendation follows 2010 ACCF/AHA/AATS/ACR/ASA/SCA/SCAI/SIR/STS/SVM Guidelines for the Diagnosis and Management of Patients with Thoracic Aortic Disease. Circulation. 2010; 121: R518-A416. Aortic aneurysm NOS (ICD10-I71.9) Aortic Atherosclerosis (ICD10-I70.0) and Aortic aneurysm NOS (ICD10-I71.9). Electronically Signed   By: Lavonia Dana M.D.   On: 05/07/2020 18:04   DG Chest Portable 1 View  Result Date: 05/07/2020 CLINICAL DATA:  Syncope.  Atrial fibrillation and hypertension EXAM: PORTABLE CHEST 1 VIEW COMPARISON:  12/19/2019 FINDINGS: Heart size within normal limits. Negative for heart failure. Mild left lower lobe atelectasis. Right lung clear. No edema or effusion. IMPRESSION: Mild left lower lobe atelectasis. Electronically Signed   By: Franchot Gallo M.D.   On: 05/07/2020 09:57   ECHOCARDIOGRAM COMPLETE  Result Date: 05/08/2020    ECHOCARDIOGRAM REPORT   Patient Name:   Jessica Kaufman Blatt Date of Exam: 05/08/2020 Medical Rec #:  [id number]          Height:       62.0 in Accession #:    [id number]         Weight:       213.2 lb Date of Birth:  10/30/1925          BSA:          1.964 m Patient Age:    40 years           BP:           160/78 mmHg Patient Gender: F                  HR:           59  bpm. Exam Location:  [facility] Procedure: 2D Echo, Cardiac Doppler and Color Doppler Indications:     Syncope R55  History:         Patient has prior history of Echocardiogram examinations, most                  recent 04/03/2019. Risk  Factors:Hypertension. AV disease, PVC.  Sonographer:     Sherrie Sport RDCS (AE) Referring Phys:  [id number] Manpreet Kemmer K Samyria Rudie Diagnosing Phys: Kathlyn Sacramento MD IMPRESSIONS  1. Left ventricular ejection fraction, by estimation, is 50 to 55%. The left ventricle has low normal function. Left ventricular endocardial border not optimally defined to evaluate regional wall motion. There is mild left ventricular hypertrophy. Left ventricular diastolic parameters are indeterminate.  2. Right ventricular systolic function is normal. The right ventricular size is normal.  3. The mitral valve is normal in structure. Mild mitral valve regurgitation. No evidence of mitral stenosis.  4. The aortic valve was not well visualized. Aortic valve regurgitation is not visualized. Moderate aortic valve stenosis. Aortic gradient does not seem accurate. Aortic stenosis seems at least moderate based on valve morphology and restricted opening.  5. The inferior vena cava is normal in size with <50% respiratory variability, suggesting right atrial pressure of 8 mmHg. FINDINGS  Left Ventricle: Left ventricular ejection fraction, by estimation, is 50 to 55%. The left ventricle has low normal function. Left ventricular endocardial border not optimally defined to evaluate regional wall motion. The left ventricular internal cavity  size was normal in size. There is mild left ventricular hypertrophy. Left ventricular diastolic parameters are indeterminate. Right Ventricle: The right ventricular size is normal. No increase in right ventricular wall thickness. Right ventricular systolic function is normal. Left Atrium: Left atrial size was normal in size. Right Atrium: Right atrial size was normal in size. Pericardium: There is no  evidence of pericardial effusion. Mitral Valve: The mitral valve is normal in structure. Mild mitral valve regurgitation. No evidence of mitral valve stenosis. Tricuspid Valve: The tricuspid valve is normal in structure. Tricuspid valve regurgitation is trivial. No evidence of tricuspid stenosis. Aortic Valve: The aortic valve was not well visualized. Aortic valve regurgitation is not visualized. Moderate aortic stenosis is present. Aortic valve mean gradient measures 5.4 mmHg. Aortic valve peak gradient measures 9.1 mmHg. Aortic valve area, by VTI measures 3.11 cm. Pulmonic Valve: The pulmonic valve was normal in structure. Pulmonic valve regurgitation is not visualized. No evidence of pulmonic stenosis. Aorta: The aortic root is normal in size and structure. Venous: The inferior vena cava is normal in size with less than 50% respiratory variability, suggesting right atrial pressure of 8 mmHg. IAS/Shunts: No atrial level shunt detected by color flow Doppler.  LEFT VENTRICLE PLAX 2D LVIDd:         3.84 cm  Diastology LVIDs:         2.38 cm  LV e' medial:    7.94 cm/s LV PW:         1.06 cm  LV E/e' medial:  11.7 LV IVS:        1.32 cm  LV e' lateral:   5.66 cm/s LVOT diam:     2.00 cm  LV E/e' lateral: 16.4 LV SV:         99 LV SV Index:   50 LVOT Area:     3.14 cm  RIGHT VENTRICLE RV S prime:     15.10 cm/s LEFT ATRIUM             Index       RIGHT ATRIUM           Index LA diam:        3.00 cm 1.53 cm/m  RA Area:     14.40 cm LA Vol (A2C):   30.7 ml 15.63 ml/m RA Volume:  36.60 ml  18.63 ml/m LA Vol (A4C):   23.8 ml 12.12 ml/m LA Biplane Vol: 27.0 ml 13.75 ml/m  AORTIC VALVE                    PULMONIC VALVE AV Area (Vmax):    2.76 cm     PV Vmax:        0.78 m/s AV Area (Vmean):   2.78 cm     PV Peak grad:   2.4 mmHg AV Area (VTI):     3.11 cm     RVOT Peak grad: 5 mmHg AV Vmax:           151.20 cm/s AV Vmean:          102.540 cm/s AV VTI:            0.317 m AV Peak Grad:      9.1 mmHg AV Mean Grad:       5.4 mmHg LVOT Vmax:         133.00 cm/s LVOT Vmean:        90.900 cm/s LVOT VTI:          0.314 m LVOT/AV VTI ratio: 0.99  AORTA Ao Root diam: 2.90 cm MITRAL VALVE               TRICUSPID VALVE MV Area (PHT): 4.83 cm    TR Peak grad:   32.5 mmHg MV Decel Time: 157 msec    TR Vmax:        285.00 cm/s MV E velocity: 93.00 cm/s MV A velocity: 62.10 cm/s  SHUNTS MV E/A ratio:  1.50        Systemic VTI:  0.31 m                            Systemic Diam: 2.00 cm Kathlyn Sacramento MD Electronically signed by Kathlyn Sacramento MD Signature Date/Time: 05/08/2020/4:06:03 PM    Final       LAB RESULTS: Basic Metabolic Panel: Recent Labs  Lab 05/07/20 0918 05/08/20 0522  NA 137 139  K 3.5 3.7  CL 106 106  CO2 23 26  GLUCOSE 112* 104*  BUN 13 15  CREATININE 0.65 0.46  CALCIUM 8.6* 8.6*  MG 1.6* 2.0   Liver Function Tests: No results for input(s): AST, ALT, ALKPHOS, BILITOT, PROT, ALBUMIN in the last 168 hours. No results for input(s): LIPASE, AMYLASE in the last 168 hours. No results for input(s): AMMONIA in the last 168 hours. CBC: Recent Labs  Lab 05/07/20 0918 05/08/20 0522  WBC 5.6 6.6  NEUTROABS 3.3  --   HGB 12.5 12.5  HCT 36.7 37.3  MCV 97.9 97.9  PLT 250 251   Cardiac Enzymes: No results for input(s): CKTOTAL, CKMB, CKMBINDEX, TROPONINI in the last 168 hours. BNP: Invalid input(s): POCBNP CBG: No results for input(s): GLUCAP in the last 168 hours.     Disposition and Follow-up: Discharge Instructions    Diet - low sodium heart healthy   Complete by: As directed    Increase activity slowly   Complete by: As directed        DISPOSITION: home    DISCHARGE FOLLOW-UP  Follow-up Information    Hamrick, Maura L, MD. Schedule an appointment as soon as possible for a visit in 2 week(s).   Specialty: Family Medicine Contact information: [address] [place] [address] [phone]  End, Harrell Gave, MD. Schedule an appointment as soon as possible  for a visit in 2 week(s).   Specialty: Cardiology Contact information: [place] [address] [phone]                Time coordinating discharge:  68mins   Signed:   Estill Cotta M.D. [facility] 05/09/2020, 1:21 PM

## 2020-05-09 NOTE — Evaluation (Signed)
Physical Therapy Evaluation Patient Details Name: Jessica Kaufman MRN: [id number] DOB: 1925-07-12 Today's Date: 05/09/2020   History of Present Illness  Pt is a 87 y/o F with PMH: PAF on Eliquis, mild to moderate aortic stenosis, labile hypertension, PVCs, dizziness with presyncope, HLD, falls, and RA who presented to ED d/t syncopal episode. Pt was in Afib wtih RVR on arrival.  Clinical Impression  Pt seen for PT evaluation at request of MD. Pt received in recliner with daughter present for session. Pt endorses her head "feels funny, feels full, woozy" but unable to elaborate further. Pt does note hx of vestibular issues that worsen with turning head L<>R (which is why pt no longer drives); pt also notes her current symptoms are not new or worse than anything she's had before. Pt is able to complete sit<>stand transfer with supervision & gait x 75 ft with RW & CGA with decreased gait speed & careful movement. PT educated pt & daughter on recommendation of supervision for mobility upon return home, as well as f/u HHPT.  BP checked in RUE: Sitting: 158/59 mmHg (MAP 87), HR 72 bpm Standing at 0: 168/80 mmHg (MAP 105), HR 81 bpm Sitting at end of session: 189/73 mmHg (MAP 107), HR 69 bpm Nurse made aware of readings.     Follow Up Recommendations Home health PT;Supervision for mobility/OOB    Equipment Recommendations  None recommended by PT    Recommendations for Other Services       Precautions / Restrictions Precautions Precautions: Fall Restrictions Weight Bearing Restrictions: No      Mobility  Bed Mobility               General bed mobility comments: not observed, pt received & left sitting in recliner    Transfers Overall transfer level: Needs assistance Equipment used: Rolling walker (2 wheeled) Transfers: Sit to/from Stand Sit to Stand: Supervision         General transfer comment: extra time to complete movement  Ambulation/Gait Ambulation/Gait  assistance: Min guard Gait Distance (Feet): 75 Feet Assistive device: Rolling walker (2 wheeled) Gait Pattern/deviations: Decreased step length - left;Decreased step length - right;Decreased stride length Gait velocity: decreased      Stairs            Wheelchair Mobility    Modified Rankin (Stroke Patients Only)       Balance Overall balance assessment: Needs assistance Sitting-balance support: Feet supported Sitting balance-Leahy Scale: Good     Standing balance support: Bilateral upper extremity supported Standing balance-Leahy Scale: Fair                               Pertinent Vitals/Pain Pain Assessment: No/denies pain    Home Living Family/patient expects to be discharged to:: Private residence Living Arrangements: Alone Available Help at Discharge: Family;Available PRN/intermittently (has 3 daughters, 2 primarily help pt) Type of Home: House Home Access: Level entry     Home Layout: Multi-level;Able to live on main level with bedroom/bathroom Home Equipment: Walker - 2 wheels;Grab bars - tub/shower (3 wheeled walker for outdoor use)      Prior Function Level of Independence: Independent with assistive device(s);Needs assistance   Gait / Transfers Assistance Needed: uses RW inside the home, 3 wheeled walker outside, notes 2 falls in the past 6 months           Hand Dominance   Dominant Hand: Right    Extremity/Trunk Assessment  Upper Extremity Assessment Upper Extremity Assessment: Overall WFL for tasks assessed    Lower Extremity Assessment Lower Extremity Assessment: Generalized weakness       Communication   Communication: No difficulties  Cognition Arousal/Alertness: Awake/alert Behavior During Therapy: WFL for tasks assessed/performed Overall Cognitive Status: Within Functional Limits for tasks assessed                                        General Comments General comments (skin integrity,  edema, etc.): Time spent educating pt & family on recommendation of supervision for OOB mobility at d/c, home modifications to increase safety    Exercises     Assessment/Plan    PT Assessment Patient needs continued PT services  PT Problem List         PT Treatment Interventions DME instruction;Therapeutic activities;Modalities;Patient/family education;Gait training;Therapeutic exercise;Stair training;Balance training;Functional mobility training;Neuromuscular re-education    PT Goals (Current goals can be found in the Care Plan section)  Acute Rehab PT Goals Patient Stated Goal: to get stronger and be able to continue living in my home PT Goal Formulation: With patient Time For Goal Achievement: 05/23/20 Potential to Achieve Goals: Good    Frequency Min 2X/week   Barriers to discharge Decreased caregiver support lives alone    Co-evaluation               AM-PAC PT "6 Clicks" Mobility  Outcome Measure Help needed turning from your back to your side while in a flat bed without using bedrails?: None Help needed moving from lying on your back to sitting on the side of a flat bed without using bedrails?: A Little Help needed moving to and from a bed to a chair (including a wheelchair)?: A Little Help needed standing up from a chair using your arms (e.g., wheelchair or bedside chair)?: A Little Help needed to walk in hospital room?: A Little Help needed climbing 3-5 steps with a railing? : A Little 6 Click Score: 19    End of Session Equipment Utilized During Treatment: Gait belt Activity Tolerance: Patient tolerated treatment well Patient left: in chair;with call bell/phone within reach;with family/visitor present Nurse Communication: Mobility status (BP) PT Visit Diagnosis: Muscle weakness (generalized) (M62.81);Unsteadiness on feet (R26.81)    Time: 0383-3383 PT Time Calculation (min) (ACUTE ONLY): 26 min   Charges:   PT Evaluation $PT Eval Low Complexity: 1  Low PT Treatments $Therapeutic Activity: 8-22 mins        Jessica Kaufman, PT, DPT 05/09/20, 9:46 AM   Jessica Kaufman 05/09/2020, 9:44 AM

## 2020-05-09 NOTE — Progress Notes (Addendum)
Progress Note  Patient Name: Jessica Kaufman Date of Encounter: 05/09/2020  Primary Cardiologist: End  Subjective   No chest pain. With BP trending into the 671I mmHg systolic, losartan has been resumed. Echo with preserved LVSF and stable moderate AS.   Inpatient Medications    Scheduled Meds: . allopurinol  300 mg Oral QODAY  . apixaban  5 mg Oral BID  . B-complex with vitamin C  1 tablet Oral Daily  . calcium-vitamin D  1 tablet Oral Daily  . cholecalciferol  1,000 Units Oral Daily  . fluticasone furoate-vilanterol  1 puff Inhalation Daily   And  . umeclidinium bromide  1 puff Inhalation Daily  . folic acid  1 mg Oral Daily  . gabapentin  600 mg Oral BID  . levothyroxine  50 mcg Oral Daily  . loratadine  10 mg Oral Daily  . losartan  50 mg Oral Daily  . methotrexate  12.5 mg Oral Weekly  . metoprolol succinate  25 mg Oral QHS  . multivitamin with minerals  1 tablet Oral Daily  . omega-3 acid ethyl esters  1 g Oral Daily  . oxybutynin  10 mg Oral Daily  . predniSONE  15 mg Oral Q breakfast  . psyllium  1 packet Oral Daily   Continuous Infusions: . sodium chloride Stopped (05/08/20 1052)   PRN Meds: acetaminophen, albuterol, dextromethorphan-guaiFENesin, hydrALAZINE, ondansetron (ZOFRAN) IV   Vital Signs    Vitals:   05/08/20 2351 05/09/20 0423 05/09/20 0809 05/09/20 0825  BP: 120/63 (!) 143/68 128/84 (!) 171/74  Pulse: 60 (!) 57 60 62  Resp: 16 16 12 17   Temp: 98.4 F (36.9 C) 97.8 F (36.6 C) (!) 97.5 F (36.4 C) 97.7 F (36.5 C)  TempSrc:  Oral Oral Oral  SpO2: 96% 95% 96% 96%  Weight:  96.5 kg    Height:        Intake/Output Summary (Last 24 hours) at 05/09/2020 0833 Last data filed at 05/09/2020 4580 Gross per 24 hour  Intake 2651.77 ml  Output 2345 ml  Net 306.77 ml   Filed Weights   05/07/20 1856 05/08/20 0513 05/09/20 0423  Weight: 97 kg 96.7 kg 96.5 kg    Telemetry    SR with 1st degree AV block - Personally Reviewed  ECG    No  new tracings - Personally Reviewed  Physical Exam   GEN: No acute distress.   Neck: No JVD. Cardiac: RRR, II/VI systolic murmur RUSB, no rubs, or gallops.  Respiratory: Clear to auscultation bilaterally.  GI: Soft, nontender, non-distended.   MS: No edema; No deformity. Neuro:  Alert and oriented x 3; Nonfocal.  Psych: Normal affect.  Labs    Chemistry Recent Labs  Lab 05/07/20 0918 05/08/20 0522  NA 137 139  K 3.5 3.7  CL 106 106  CO2 23 26  GLUCOSE 112* 104*  BUN 13 15  CREATININE 0.65 0.46  CALCIUM 8.6* 8.6*  GFRNONAA >60 >60  ANIONGAP 8 7     Hematology Recent Labs  Lab 05/07/20 0918 05/08/20 0522  WBC 5.6 6.6  RBC 3.75* 3.81*  HGB 12.5 12.5  HCT 36.7 37.3  MCV 97.9 97.9  MCH 33.3 32.8  MCHC 34.1 33.5  RDW 14.5 14.5  PLT 250 251    Cardiac EnzymesNo results for input(s): TROPONINI in the last 168 hours. No results for input(s): TROPIPOC in the last 168 hours.   BNP Recent Labs  Lab 05/07/20 0940  BNP 179.8*  DDimer No results for input(s): DDIMER in the last 168 hours.   Radiology    CT HEAD WO CONTRAST  Result Date: 05/07/2020 IMPRESSION: 1. No acute intracranial abnormalities. 2. Chronic small vessel ischemic disease and brain atrophy. Electronically Signed   By: Kerby Moors M.D.   On: 05/07/2020 14:55   CT ANGIO CHEST PE W OR WO CONTRAST  Result Date: 05/07/2020 IMPRESSION: No evidence of pulmonary embolism. Scattered parenchymal lung scarring and dependent atelectasis at the lung bases. Aneurysmal dilatation ascending thoracic aorta 4.0 cm diameter; Recommend annual imaging followup by CTA or MRA, if clinically indicated based on patient age and comorbidities. This recommendation follows 2010 ACCF/AHA/AATS/ACR/ASA/SCA/SCAI/SIR/STS/SVM Guidelines for the Diagnosis and Management of Patients with Thoracic Aortic Disease. Circulation. 2010; 121: I948-N462. Aortic aneurysm NOS (ICD10-I71.9) Aortic Atherosclerosis (ICD10-I70.0) and Aortic  aneurysm NOS (ICD10-I71.9). Electronically Signed   By: Lavonia Dana M.D.   On: 05/07/2020 18:04   DG Chest Portable 1 View  Result Date: 05/07/2020 IMPRESSION: Mild left lower lobe atelectasis. Electronically Signed   By: Franchot Gallo M.D.   On: 05/07/2020 09:57    Cardiac Studies   2D echo 05/08/2020: 1. Left ventricular ejection fraction, by estimation, is 50 to 55%. The  left ventricle has low normal function. Left ventricular endocardial  border not optimally defined to evaluate regional wall motion. There is  mild left ventricular hypertrophy. Left  ventricular diastolic parameters are indeterminate.  2. Right ventricular systolic function is normal. The right ventricular  size is normal.  3. The mitral valve is normal in structure. Mild mitral valve  regurgitation. No evidence of mitral stenosis.  4. The aortic valve was not well visualized. Aortic valve regurgitation  is not visualized. Moderate aortic valve stenosis. Aortic gradient does  not seem accurate. Aortic stenosis seems at least moderate based on valve  morphology and restricted opening.  5. The inferior vena cava is normal in size with <50% respiratory  variability, suggesting right atrial pressure of 8 mmHg. __________  2D echo 03/2019: 1. Left ventricular ejection fraction, by estimation, is 55 to 60%. The  left ventricle has normal function. The left ventricle has no regional  wall motion abnormalities. Left ventricular diastolic parameters were  normal.  2. Right ventricular systolic function is normal. The right ventricular  size is normal.  3. The mitral valve is grossly normal. Trivial mitral valve  regurgitation.  4. The aortic valve was not well visualized. Aortic valve regurgitation  is not visualized. Moderate aortic valve stenosis.  5. The inferior vena cava is normal in size with greater than 50%  respiratory variability, suggesting right atrial pressure of 3 mmHg. __________  Elwyn Reach patch  02/2019:  The patient was monitored for 13 days, 23 hours.  The predominant rhythm was sinus with an average rate of 62 bpm (range 41 to 107 bpm in sinus).  Rare PACs and PVCs occurred.  There were 39 episodes of paroxysmal supraventricular tachycardia lasting up to 10 beats with a maximum rate of 184 bpm.  Brief episodes of junctional rhythm occurred, often in the setting of PACs and PVCs.  Patient triggered events correspond to sinus rhythm, PACs, and PVCs transient junctional rhythm.  Predominately sinus rhythm with rare PACs and PVCs. 39 episodes of brief PSVT occurred, as well as transient junctional rhythm (often in the setting of PACs and PVCs).  Patient Profile     87 y.o. female with history of PAF on Eliquis, mild to moderate aortic stenosis, labile hypertension, PVCs, dizziness  with presyncope, HLD, falls, and RA who is being seen today for the evaluation of syncope at the request of Dr. Blaine Hamper.  Assessment & Plan    1. Syncope: -Occurred in the context of positional change when going from sitting to standing with EMS after being called for generalized weakness -Agree with holding of HCTZ  -Would look to attempt to avoid diuretics -Orthostatic vital signs normal following discontinuation of HCTZ and IV fluids -She will need permissive hypertension  -Echo as above -No significant arrhythmias, high grade AV block, or significant pauses on telemetry   2. Elevated high sensitivity troponin: -Mildly elevated, not consistent with ACS  -No indication for heparin gtt at this time -Echo as above -No plans for inpatient ischemic evaluation at this time  3. Labile hypertension: -She will need some degree of permissive hypertension -Agree with holding HCTZ  -Continue metoprolol given underlying PAF -With elevated BP, she has been restarted on losartan -IF BP remains elevated > 160 mmHg, consider adding low dose amlodipine vs titration of losartan   4. Moderate aortic  stenosis: -Not previously felt to be contributing to her presyncope -Stable on echo as above  5. PAF: -Episodes appear to be relatively infrequent  -Appears her symptoms when in Afib are fatigue and generalized weakness, which may have contributed to her presentation this admission -While in sinus, her rates typically run in the 50s to 60s bpm, which precludes escalation of beta blocker -If episodes become more frequent, could use amiodarone  -Eliquis -CHADS2VASc 5 (HTN, age x 2, vascular disease, sex category)   For questions or updates, please contact [facility] Please consult [email or web address] for contact info under Cardiology/STEMI.    Signed, Christell Faith, PA-C [place] Pager: [phone] 05/09/2020, 8:33 AM  Attending Note:   The patient was seen and examined.  Agree with assessment and plan as noted above.  Changes made to the above note as needed.  Patient seen and independently examined with  Christell Faith, PA .   We discussed all aspects of the encounter. I agree with the assessment and plan as stated above.  1.   PAF :   Pt is in sinus rhythm this am . Is on eliquis.    HR is well controlled   2.  Orthostatic hypotension:   Agree with permissive HTN HCTZ is currently on hold She will follow up with Dr. Saunders Revel in the clinic    3.  Moderate AS:   Cont to follow   4.  Elevated tropononin:  Likely demand ischemia : No further evaluation needed.   5.  Generalized weakness:  Plans per IM    I have spent a total of 40 minutes with patient reviewing hospital  notes , telemetry, EKGs, labs and examining patient as well as establishing an assessment and plan that was discussed with the patient. > 50% of time was spent in direct patient care.    Thayer Headings, Brooke Bonito., MD, [facility] 05/09/2020, 10:01 AM [address],  [place] Pager [phone]

## 2020-05-09 NOTE — Progress Notes (Signed)
Occupational Therapy Treatment Patient Details Name: Jessica Kaufman MRN: [id number] DOB: 07-13-1925 Today's Date: 05/09/2020    History of present illness Pt is a 87 y/o F with PMH: PAF on Eliquis, mild to moderate aortic stenosis, labile hypertension, PVCs, dizziness with presyncope, HLD, falls, and RA who presented to ED d/t syncopal episode. Pt was in Afib wtih RVR on arrival.   OT comments  Pt seen for OT treatment this date to f/u re: safety with ADLs/ADL mobility. Pt demos improved fxl activity tolerance and balance this date. Pt requires SUPV for STS and SPS transfers with 2WW. Pt stands sink-side to complete grooming including oral care and combing hair with SETUP/SUPV. Pt able to perform fxl mobility to/from restroom with only MIN cues for safe use of RW from OT. She uses grab bar appropriately to assist with transfer. Pt able to perform peri care in standing with SETUP/SUPV for balance. She does requires MIN A in standing with LB bathing for thorough completion and requires MOD A in sitting to don sandals with velcro straps d/t some decreased LE ROM and SOB with attempts to bend at the waist. OT educates re: energy conservation and initiating rest breaks as needed. Pt with good understanding and tolerance throughout session. Will continue to follow acutely. Continue to anticipate pt will benefit from [facility] f/u.     Follow Up Recommendations  Home health OT;Supervision - Intermittent    Equipment Recommendations  Tub/shower seat    Recommendations for Other Services      Precautions / Restrictions Precautions Precautions: Fall Restrictions Weight Bearing Restrictions: No Other Position/Activity Restrictions: monitor BP (high during assessment, but pt with dizziness/light-headedness).       Mobility Bed Mobility               General bed mobility comments: up to chair pre/post session    Transfers Overall transfer level: Needs assistance Equipment used: Rolling  walker (2 wheeled) Transfers: Sit to/from Stand Sit to Stand: Supervision Stand pivot transfers: Supervision       General transfer comment: improved commode transfer technique this date, improved tolerance.    Balance Overall balance assessment: Needs assistance Sitting-balance support: Feet supported Sitting balance-Leahy Scale: Good     Standing balance support: Bilateral upper extremity supported Standing balance-Leahy Scale: Fair Standing balance comment: benefits from UE support, but is able to alternate UEs from RW support to engage in standing ADLs.                           ADL either performed or assessed with clinical judgement   ADL Overall ADL's : Needs assistance/impaired     Grooming: Wash/dry hands;Wash/dry face;Oral care;Applying deodorant;Brushing hair;Supervision/safety;Set up;Standing Grooming Details (indicate cue type and reason): sink-side with RW for support Upper Body Bathing: Set up;Sitting   Lower Body Bathing: Minimal assistance;Sit to/from stand Lower Body Bathing Details (indicate cue type and reason): MIN A for thorough completion, but pt able to balance and contribute meaningfully to task Upper Body Dressing : Independent;Sitting Upper Body Dressing Details (indicate cue type and reason): to don bra and t-shirt Lower Body Dressing: Moderate assistance;Sitting/lateral leans Lower Body Dressing Details (indicate cue type and reason): to don sandals with straps. Pt endorses having difficulty with this at baseline and sometimes wears slip on shoes w/o velcro straps so she doesn't have to bend over. Toilet Transfer: Supervision/safety;Ambulation;RW;Grab bars;BSC Toilet Transfer Details (indicate cue type and reason): use of toilet rails over commode  to stabilize Toileting- Water quality scientist and Hygiene: Supervision/safety;Sit to/from stand Toileting - Clothing Manipulation Details (indicate cue type and reason): grab bars      Functional mobility during ADLs: Supervision/safety;Rolling walker (to/from restroom with 2WW)       Vision Baseline Vision/History: Wears glasses Wears Glasses: At all times Patient Visual Report: No change from baseline     Perception     Praxis      Cognition Arousal/Alertness: Awake/alert Behavior During Therapy: WFL for tasks assessed/performed Overall Cognitive Status: Within Functional Limits for tasks assessed                                          Exercises Other Exercises Other Exercises: bathing, dressing and grooming tasks wtih OT, standing with 2WW sink-side. Primarily SUPV level, MOD A for donning velcro strap sandals   Shoulder Instructions       General Comments Time spent educating pt & family on recommendation of supervision for OOB mobility at d/c, home modifications to increase safety    Pertinent Vitals/ Pain       Pain Assessment: No/denies pain Pain Score: 2  Pain Location: slight stiffness in her neck Pain Descriptors / Indicators: Discomfort;Tightness Pain Intervention(s): Monitored during session;Repositioned  Home Living Family/patient expects to be discharged to:: Private residence Living Arrangements: Alone Available Help at Discharge: Family;Available PRN/intermittently (has 3 daughters, 2 primarily help pt) Type of Home: House Home Access: Level entry     Home Layout: Multi-level;Able to live on main level with bedroom/bathroom Alternate Level Stairs-Number of Steps: 12 Alternate Level Stairs-Rails: Right;Left           Home Equipment: Walker - 2 wheels;Grab bars - tub/shower (3 wheeled walker for outdoor use)          Prior Functioning/Environment Level of Independence: Independent with assistive device(s);Needs assistance  Gait / Transfers Assistance Needed: uses RW inside the home, 3 wheeled walker outside, notes 2 falls in the past 6 months         Frequency  Min 1X/week        Progress  Toward Goals  OT Goals(current goals can now be found in the care plan section)  Progress towards OT goals: Progressing toward goals  Acute Rehab OT Goals Patient Stated Goal: to get stronger and be able to continue living in my home OT Goal Formulation: With patient Time For Goal Achievement: 05/22/20 Potential to Achieve Goals: Good  Plan Discharge plan remains appropriate    Co-evaluation                 AM-PAC OT "6 Clicks" Daily Activity     Outcome Measure   Help from another person eating meals?: None Help from another person taking care of personal grooming?: A Little Help from another person toileting, which includes using toliet, bedpan, or urinal?: A Little Help from another person bathing (including washing, rinsing, drying)?: A Lot Help from another person to put on and taking off regular upper body clothing?: None Help from another person to put on and taking off regular lower body clothing?: A Lot 6 Click Score: 18    End of Session Equipment Utilized During Treatment: Gait belt;Rolling walker  OT Visit Diagnosis: Unsteadiness on feet (R26.81);Muscle weakness (generalized) (M62.81)   Activity Tolerance Patient tolerated treatment well   Patient Left in chair;with call bell/phone within reach;with family/visitor present   Nurse  Communication Mobility status        Time: 0768-0881 OT Time Calculation (min): 53 min  Charges: OT General Charges $OT Visit: 1 Visit OT Treatments $Self Care/Home Management : 23-37 mins $Therapeutic Activity: 23-37 mins  Gerrianne Scale, MS, OTR/L ascom [phone] 05/09/20, 1:13 PM

## 2020-05-09 NOTE — TOC Transition Note (Addendum)
Transition of Care [facility] - CM/SW Discharge Note   Patient Details  Name: Jessica Kaufman MRN: [id number] Date of Birth: 09/01/1925  Transition of Care [facility]) CM/SW Contact:  Izola Price, RN Phone Number: 05/09/2020, 10:46 AM   Clinical Narrative:  Patient to be discharged with [facility] PT. Spoke with patient and patien'ts daughter [name]. Patient prefers [facility] as she was recently discharged from their service in March. Reached out to intake RN for [facility] and they are checking on taking patient back. Awaiting confirmation. Patient has all equipment needed at home from prior [facility] PT/Needs or will purchase on own per daughter. [name] RN CM 1048 am.    230 pm. Update. [facility] accepted per Carol/Intake. Faxed requested documents to [phone].  Daughter [name] notified, staff updated. Will DC today. Start of service Wednesday, 05/13/20. Family will be available to stay with patient till that time per [name], daughter, who used to work in home care with [facility]. [name] RN CM  Final next level of care: [facility]     Patient Goals and CMS Choice        Discharge Placement                       Discharge Plan and Services [facility] PT with [facility].                 DME Arranged: N/A (Patient has all needed equipment from prior [facility] PT.) DME Agency: NA       HH Arranged: PT [place]: [place]        Social Determinants of Health (SDOH) Interventions     Readmission Risk Interventions No flowsheet data found.

## 2020-05-12 ENCOUNTER — Telehealth: Payer: Self-pay | Admitting: Internal Medicine

## 2020-05-12 DIAGNOSIS — I493 Ventricular premature depolarization: Secondary | ICD-10-CM | POA: Diagnosis not present

## 2020-05-12 DIAGNOSIS — I10 Essential (primary) hypertension: Secondary | ICD-10-CM | POA: Diagnosis not present

## 2020-05-12 DIAGNOSIS — I700 Atherosclerosis of aorta: Secondary | ICD-10-CM | POA: Diagnosis not present

## 2020-05-12 DIAGNOSIS — J309 Allergic rhinitis, unspecified: Secondary | ICD-10-CM | POA: Diagnosis not present

## 2020-05-12 DIAGNOSIS — E039 Hypothyroidism, unspecified: Secondary | ICD-10-CM | POA: Diagnosis not present

## 2020-05-12 DIAGNOSIS — E669 Obesity, unspecified: Secondary | ICD-10-CM | POA: Diagnosis not present

## 2020-05-12 DIAGNOSIS — I480 Paroxysmal atrial fibrillation: Secondary | ICD-10-CM | POA: Diagnosis not present

## 2020-05-12 DIAGNOSIS — I719 Aortic aneurysm of unspecified site, without rupture: Secondary | ICD-10-CM | POA: Diagnosis not present

## 2020-05-12 DIAGNOSIS — R55 Syncope and collapse: Secondary | ICD-10-CM | POA: Diagnosis not present

## 2020-05-12 DIAGNOSIS — Z85828 Personal history of other malignant neoplasm of skin: Secondary | ICD-10-CM | POA: Diagnosis not present

## 2020-05-12 DIAGNOSIS — E785 Hyperlipidemia, unspecified: Secondary | ICD-10-CM | POA: Diagnosis not present

## 2020-05-12 DIAGNOSIS — J45909 Unspecified asthma, uncomplicated: Secondary | ICD-10-CM | POA: Diagnosis not present

## 2020-05-12 DIAGNOSIS — M109 Gout, unspecified: Secondary | ICD-10-CM | POA: Diagnosis not present

## 2020-05-12 DIAGNOSIS — K5900 Constipation, unspecified: Secondary | ICD-10-CM | POA: Diagnosis not present

## 2020-05-12 DIAGNOSIS — M069 Rheumatoid arthritis, unspecified: Secondary | ICD-10-CM | POA: Diagnosis not present

## 2020-05-12 DIAGNOSIS — Z7951 Long term (current) use of inhaled steroids: Secondary | ICD-10-CM | POA: Diagnosis not present

## 2020-05-12 DIAGNOSIS — I959 Hypotension, unspecified: Secondary | ICD-10-CM | POA: Diagnosis not present

## 2020-05-12 NOTE — Telephone Encounter (Signed)
Pt c/o BP issue: STAT if pt c/o blurred vision, one-sided weakness or slurred speech  1. What are your last 5 BP readings? Trending 190's/90's HR 50's   2. Are you having any other symptoms (ex. Dizziness, headache, blurred vision, passed out)? Swelling in feet  .  Denied any other symptoms .  Will weigh while waiting on call back.   3. What is your BP issue?  Per daughter patient meds were changed upon hospital and may need adjusting .

## 2020-05-12 NOTE — Telephone Encounter (Signed)
Spoke with the patients daughter Jessica Kaufman. Jessica Kaufman sts that the patient had increase LE edema and elevated BP since the patient was d/c from the hospital on 05/09/20 and HCTZ was stopped. No sob, orthopnea or pnd. Confirmed that the patient is currently taking Losartan 50 mg bid and Metoprolol succ 25 mg daily. Patients weight this morning on her home scale 213 lb. Her last weight was taken during her pulmonary appt on 05/05/20 and was 215 lbs. Patients by has been averaging 190's/90's HR 50's.  Patient post hospital f/u is scheduled with Dr. Saunders Revel on 05/21/20. Recommended that the patient be seen sooner for possible medication adjustments. Patients daughter is agreeable. Appt rescheduled for 05/13/20 @ 1:40pm. Jessica Kaufman is aware of the new appt date and time.

## 2020-05-13 ENCOUNTER — Encounter: Payer: Self-pay | Admitting: Internal Medicine

## 2020-05-13 ENCOUNTER — Other Ambulatory Visit: Payer: Self-pay

## 2020-05-13 ENCOUNTER — Ambulatory Visit: Payer: PPO | Admitting: Internal Medicine

## 2020-05-13 VITALS — BP 160/90 | HR 62 | Ht 61.0 in | Wt 214.0 lb

## 2020-05-13 DIAGNOSIS — R55 Syncope and collapse: Secondary | ICD-10-CM

## 2020-05-13 DIAGNOSIS — R9431 Abnormal electrocardiogram [ECG] [EKG]: Secondary | ICD-10-CM

## 2020-05-13 DIAGNOSIS — I350 Nonrheumatic aortic (valve) stenosis: Secondary | ICD-10-CM

## 2020-05-13 DIAGNOSIS — R0989 Other specified symptoms and signs involving the circulatory and respiratory systems: Secondary | ICD-10-CM | POA: Diagnosis not present

## 2020-05-13 DIAGNOSIS — I471 Supraventricular tachycardia: Secondary | ICD-10-CM

## 2020-05-13 DIAGNOSIS — I480 Paroxysmal atrial fibrillation: Secondary | ICD-10-CM

## 2020-05-13 MED ORDER — HYDROCHLOROTHIAZIDE 12.5 MG PO CAPS: 12.5000 mg | ORAL_CAPSULE | Freq: Every day | ORAL | 3 refills | Status: DC

## 2020-05-13 NOTE — Progress Notes (Signed)
Follow-up Outpatient Visit Date: 05/13/2020  Primary Care Provider: Leonides Sake, [place] [address]  Chief Complaint: Follow-up elevated blood pressures and recent hospitalization for syncope  HPI:  Ms. Chestnutt is a 87 y.o. female with history of labile hypertension, paroxysmal atrial fibrillation, mild to moderate aortic stenosis, PVCs, hyperlipidemia, and rheumatoid arthritis, who presents for follow-up of syncope.  I last saw her in 02/2020, at which time she was doing relatively well but had suffered a couple of falls over the preceding month.  She also reported occasional orthostatic lightheadedness with some low blood pressures.  We agreed to defer medication changes and work on hydration.  She was hospitalized last week with fatigue and syncopal episode when EMS arrived.  She was noted to be in atrial fibrillation with rapid ventricular response.  HCTZ was discontinued.  The patient's daughter reached out to our office yesterday noting elevated blood pressures (190s over 90s) and swelling in the feet.  Today, Ms. Stouffer reports that she is feeling relatively well though she has noted her blood pressures to be higher than normal since leaving the hospital.  She frequently has readings in the 341D to 622W systolic.  She also has mild swelling in her feet, though this is near her baseline.  She notes that she felt "funny and woozy" again yesterday albeit less severe than leading up to her hospitalization last week.  She was worried that she may fall though she did not.  She has stable exertional dyspnea and denies chest pain as well as palpitations.  She was recently started on Trelegy by pulmonary and is scheduled for pulmonary function testing.  --------------------------------------------------------------------------------------------------  Past Medical History:  Diagnosis Date  . Aortic valve disease   . Asthma   . Cancer ([place])    basal cell   .  Gout   . Hyperlipidemia   . Hypertension   . Hypertensive heart disease without heart failure   . Hypothyroidism   . PAF (paroxysmal atrial fibrillation) ([place])   . PVC (premature ventricular contraction)   . Rheumatoid arthritis ([place])   . Ventricular premature depolarization    Past Surgical History:  Procedure Laterality Date  . BLADDER SURGERY  10/19/2010  . BONY PELVIS SURGERY  1990  . BREAST BIOPSY Left 1986  . CARDIAC SURGERY  2009   PVC  . CATARACT EXTRACTION Left 11/15/00  . CATARACT EXTRACTION Right   . ELBOW SURGERY  06/1990  . HIP SURGERY Left 05/28/09  . HIP SURGERY Right 06/30/03  . HYSTEROSCOPY  06/17/1986   vaginal    Current Meds  Medication Sig  . acetaminophen (TYLENOL) 650 MG CR tablet Take 650 mg by mouth every 8 (eight) hours as needed for pain.  Marland Kitchen allopurinol (ZYLOPRIM) 300 MG tablet Take 1 tablet by mouth every other day.   Marland Kitchen amoxicillin (AMOXIL) 500 MG tablet Take 4 tablets (2,000 mg total) by mouth 2 (two) times daily. For dental procedures only  . apixaban (ELIQUIS) 5 MG TABS tablet Take 5 mg by mouth 2 (two) times daily.   Marland Kitchen b complex vitamins tablet Take 1 tablet by mouth daily.  . Bilberry, Vaccinium myrtillus, 1000 MG CAPS Take 2 capsules by mouth daily.  Marland Kitchen Bioflavonoid Products (BIOFLEX) TABS Take 2 tablets by mouth daily.  . Cholecalciferol (VITAMIN D3) 1000 UNITS CAPS Take 1 capsule by mouth daily.  . Coenzyme Q10 (COQ10) 100 MG CAPS Take 1 capsule by mouth daily.  Marland Kitchen CRANBERRY PO Take  1 tablet by mouth daily.  . fexofenadine (ALLEGRA) 180 MG tablet Take 180 mg by mouth daily.  . Fluticasone-Umeclidin-Vilant (TRELEGY ELLIPTA) 100-62.5-25 MCG/INH AEPB Inhale 100 mcg into the lungs daily.  . folic acid (FOLVITE) 1 MG tablet Take by mouth daily.   Marland Kitchen gabapentin (NEURONTIN) 600 MG tablet Take 600 mg by mouth 2 (two) times daily.  . Ginger, Zingiber officinalis, (GINGER ROOT PO) Take by mouth daily.  Marland Kitchen levothyroxine (SYNTHROID, LEVOTHROID) 50 MCG  tablet Take 1 tablet by mouth daily.  Marland Kitchen losartan (COZAAR) 50 MG tablet Take 1 tablet by mouth twice a day  . methotrexate (RHEUMATREX) 2.5 MG tablet Take 12.5 mg by mouth once a week. Takes 5 tablets weekly on Saturday  . metoprolol succinate (TOPROL-XL) 25 MG 24 hr tablet Take 1 tablet (25 mg total) by mouth at bedtime.  . Multiple Vitamins-Minerals (CENTRUM SILVER PO) Take 1 tablet by mouth daily.  . Multiple Vitamins-Minerals (PRESERVISION AREDS 2 PO) Take 1 capsule by mouth 2 (two) times daily.   . Omega-3 Fatty Acids (FISH OIL) 1000 MG CAPS Take 2 capsules by mouth daily.  Marland Kitchen oxybutynin (DITROPAN-XL) 10 MG 24 hr tablet Take 10 mg by mouth daily.  Marland Kitchen PREMARIN vaginal cream Place 1 Applicatorful vaginally in the morning and at bedtime.   . psyllium (METAMUCIL) 58.6 % powder Take 1 packet by mouth daily.  . Pyridoxine HCl (VITAMIN B6 PO) Take 1,000 mg by mouth daily.  . TURMERIC PO Take by mouth daily.    Allergies: Metronidazole, Sulindac, and Naproxen  Social History   Tobacco Use  . Smoking status: Never Smoker  . Smokeless tobacco: Never Used  Vaping Use  . Vaping Use: Never used  Substance Use Topics  . Alcohol use: No  . Drug use: No    Family History  Problem Relation Age of Onset  . Stroke Mother   . Melanoma Mother   . Heart attack Father   . Uterine cancer Sister   . Melanoma Sister   . Heart failure Sister   . Heart attack Maternal Grandmother   . Breast cancer Daughter     Review of Systems: A 12-system review of systems was performed and was negative except as noted in the HPI.  --------------------------------------------------------------------------------------------------  Physical Exam: BP (!) 160/90 (BP Location: Left Arm, Patient Position: Sitting, Cuff Size: Large)   Pulse 62   Ht 5\' 1"  (1.549 m)   Wt 214 lb (97.1 kg)   SpO2 96%   BMI 40.43 kg/m   General:  NAD.  Accompanied by her daughter. Neck: No JVD or HJR. Lungs: Clear to auscultation  bilaterally without wheezes or crackles. Heart: Regular rate and rhythm without murmurs, rubs, or gallops. Abdomen: Soft, nontender, nondistended. Extremities: Trace pedal edema bilaterally.  EKG: Sinus rhythm with inferolateral ST/T changes, not significantly changed from prior tracing on 05/07/2020.  Lab Results  Component Value Date   WBC 6.6 05/08/2020   HGB 12.5 05/08/2020   HCT 37.3 05/08/2020   MCV 97.9 05/08/2020   PLT 251 05/08/2020    Lab Results  Component Value Date   NA 139 05/08/2020   K 3.7 05/08/2020   CL 106 05/08/2020   CO2 26 05/08/2020   BUN 15 05/08/2020   CREATININE 0.46 05/08/2020   GLUCOSE 104 (H) 05/08/2020   ALT 21 10/02/2019    Lab Results  Component Value Date   CHOL 199 05/08/2020   HDL 59 05/08/2020   LDLCALC 122 (H) 05/08/2020  TRIG 88 05/08/2020   CHOLHDL 3.4 05/08/2020    --------------------------------------------------------------------------------------------------  ASSESSMENT AND PLAN: Syncope and paroxysmal atrial fibrillation: Most likely precipitated by paroxysmal atrial fibrillation with rapid ventricular response.  EKG today shows sinus rhythm with a rate of 62 bpm.  I recommend continuation of apixaban and metoprolol succinate 25 mg daily.  If she were to have recurrent episodes of symptomatic atrial fibrillation, we may need to consider addition of amiodarone to help maintain sinus rhythm.  Labile hypertension: Blood pressure is elevated today in the office as well as at home.  Long-term management of blood pressure has been challenging due to its labile nature.  I think it would be reasonable to add low-dose amlodipine, though Ms. Zigmund Daniel and her daughter are reluctant to do so due to potential side effects in the past (they believe she may have had edema but are unsure).  They would prefer to restart HCTZ that was recently discontinued in the setting of syncope and concern for dehydration.  We have discussed risks for  recurrent lightheadedness with this, but they request reinitiation of this agent.  We will therefore add back HCTZ 12.5 mg daily (she was on 25 mg prior to recent hospitalization).  I have encouraged Ms. Saar to stay well-hydrated.  BMP should be checked when she returns for follow-up in 1 month.  Aortic stenosis: Probably moderate by recent echo with preserved LVEF, though visualization of the valve and Doppler envelopes were suboptimal.  I am not convinced that her recent syncope was related to her AS, though this could have been contributing to some of her symptoms in the setting of blood pressure fluctuations and atrial fibrillation with rapid ventricular response.  We will continue close clinical follow-up.  Abnormal EKG: EKG today again shows inferolateral ST/T changes.  Ms. Burbano does not have any angina.  We previously discussed ischemia evaluation and agreed to defer this in light of her advanced age and comorbidities.  Follow-up: Return to clinic in 1 month.  Nelva Bush, MD 05/13/2020 2:37 PM

## 2020-05-13 NOTE — Patient Instructions (Signed)
Medication Instructions:  - Your physician has recommended you make the following change in your medication:   1) START Hydrochlorothiazide (HCTZ) 12.5 mg- take 1 tablet by mouth once daily   *If you need a refill on your cardiac medications before your next appointment, please call your pharmacy*   Lab Work: - none ordered  If you have labs (blood work) drawn today and your tests are completely normal, you will receive your results only by: Marland Kitchen MyChart Message (if you have MyChart) OR . A paper copy in the mail If you have any lab test that is abnormal or we need to change your treatment, we will call you to review the results.   Testing/Procedures: - none ordered   Follow-Up: At [facility], you and your health needs are our priority.  As part of our continuing mission to provide you with exceptional heart care, we have created designated Provider Care Teams.  These Care Teams include your primary Cardiologist (physician) and Advanced Practice Providers (APPs -  Physician Assistants and Nurse Practitioners) who all work together to provide you with the care you need, when you need it.  We recommend signing up for the patient portal called "MyChart".  Sign up information is provided on this After Visit Summary.  MyChart is used to connect with patients for Virtual Visits (Telemedicine).  Patients are able to view lab/test results, encounter notes, upcoming appointments, etc.  Non-urgent messages can be sent to your provider as well.   To learn more about what you can do with MyChart, go to [email or web address].    Your next appointment:   1 month(s)  The format for your next appointment:   In Person  Provider:   You may see Nelva Bush, MD or one of the following Advanced Practice Providers on your designated Care Team:    Murray Hodgkins, NP  Christell Faith, PA-C  Marrianne Mood, PA-C  Cadence Kathlen Mody, [place]  Laurann [place], NP    Other  Instructions  Hydrochlorothiazide Capsules or Tablets What is this medicine? HYDROCHLOROTHIAZIDE (hye droe klor oh THYE a zide) is a diuretic. It helps you make more urine and to lose salt and excess water from your body. It treats swelling from heart, kidney, or liver disease. It also treats high blood pressure. This medicine may be used for other purposes; ask your health care provider or pharmacist if you have questions. COMMON BRAND NAME(S): Esidrix, Ezide, HydroDIURIL, Microzide, Oretic, Zide What should I tell my health care provider before I take this medicine? They need to know if you have any of these conditions:  diabetes  gout  kidney disease  liver disease  lupus  pancreatitis  an unusual or allergic reaction to hydrochlorothiazide, sulfa drugs, other medicines, foods, dyes, or preservatives  pregnant or trying to get pregnant  breast-feeding How should I use this medicine? Take this medicine by mouth. Take it as directed on the prescription label at the same time every day. You can take it with or without food. If it upsets your stomach, take it with food. Keep taking it unless your health care provider tells you to stop. Talk to your health care provider about the use of this medicine in children. While it may be prescribed for children as young as newborns for selected conditions, precautions do apply. Overdosage: If you think you have taken too much of this medicine contact a poison control center or emergency room at once. NOTE: This medicine is only for you. Do not share  this medicine with others. What if I miss a dose? If you miss a dose, take it as soon as you can. If it is almost time for your next dose, take only that dose. Do not take double or extra doses. What may interact with this medicine?  cholestyramine  colestipol  digoxin  dofetilide  lithium  medicines for blood pressure  medicines for diabetes  medicines that relax muscles for  surgery  other diuretics  steroid medicines like prednisone or cortisone This list may not describe all possible interactions. Give your health care provider a list of all the medicines, herbs, non-prescription drugs, or dietary supplements you use. Also tell them if you smoke, drink alcohol, or use illegal drugs. Some items may interact with your medicine. What should I watch for while using this medicine? Visit your health care provider for regular check ups. Check your blood pressure as directed. Ask your health care provider what your blood pressure should be. Also, find out when you should contact him or her. Do not treat yourself for coughs, colds, or pain while you are using this medicine without asking your health care provider for advice. Some medicines may increase your blood pressure. You may get drowsy or dizzy. Do not drive, use machinery, or do anything that needs mental alertness until you know how this medicine affects you. Do not stand or sit up quickly, especially if you are an older patient. This reduces the risk of dizzy or fainting spells. Alcohol can make you more drowsy and dizzy. Avoid alcoholic drinks. Talk to your health care professional about your risk of skin cancer. You may be more at risk for skin cancer if you take this medicine. This medicine can make you more sensitive to the sun. Keep out of the sun. If you cannot avoid being in the sun, wear protective clothing and use sunscreen. Do not use sun lamps or tanning beds/booths. You may need to be on a special diet while taking this medicine. Ask your health care provider. Also, find out how many glasses of fluids you need to drink each day. Check with your health care provider if you get an attack of severe diarrhea, nausea and vomiting, or if you sweat a lot. The loss of too much body fluid can make it dangerous for you to take this medicine. This medicine may increase blood sugar. Ask your healthcare provider if  changes in diet or medicines are needed if you have diabetes. What side effects may I notice from receiving this medicine? Side effects that you should report to your doctor or health care professional as soon as possible:  allergic reactions (skin rash, itching or hives; swelling of the face, lips, or tongue)  gout (severe pain, redness, or swelling in joints like the big toe)  high blood sugar (increased hunger, thirst or urination; unusually weak or tired; blurry vision)  kidney injury (trouble passing urine or change in the amount of urine)  low blood pressure (dizziness; feeling faint or lightheaded, falls; unusually weak or tired)  low potassium levels (trouble breathing; chest pain; dizziness; fast, irregular heartbeat; feeling faint or lightheaded, falls; muscle cramps or pain)  sudden change in vision or eye pain Side effects that usually do not require medical attention (report to your doctor or health care professional if they continue or are bothersome):  change in sex drive or performance  dry mouth  headache  stomach upset This list may not describe all possible side effects. Call your doctor  for medical advice about side effects. You may report side effects to FDA at 1-800-FDA-1088. Where should I keep my medicine? Keep out of the reach of children and pets. Store at room temperature between 20 and 25 degrees C (68 and 77 degrees F). Protect from light and moisture. Keep the container tightly closed. Do not freeze. Get rid of any unused medicine after the expiration date. To get rid of medicines that are no longer needed or have expired:  Take the medicine to a medicine take-back program. Check with your pharmacy or law enforcement to find a location.  If you cannot return the medicine, check the label or package insert to see if the medicine should be thrown out in the garbage or flushed down the toilet. If you are not sure, ask your health care provider. If it is  safe to put in the trash, empty the medicine out of the container. Mix the medicine with cat litter, dirt, coffee grounds, or other unwanted substance. Seal the mixture in a bag or container. Put it in the trash. NOTE: This sheet is a summary. It may not cover all possible information. If you have questions about this medicine, talk to your doctor, pharmacist, or health care provider.  2021 Elsevier/Gold Standard (2019-10-30 17:16:00)

## 2020-05-14 ENCOUNTER — Telehealth: Payer: Self-pay | Admitting: Internal Medicine

## 2020-05-14 NOTE — Telephone Encounter (Signed)
Patient homehealth PT is calling to see if Dr.End would mind to sign the home health orders. Patient was seen 5/11 for hospital fu and has been unable to see or make appt with PCP  Please advise

## 2020-05-14 NOTE — Telephone Encounter (Signed)
Spoke with Candance at [facility] and advised those orders would need to be signed by primary care provider. She made a note and will update Belenda Cruise. No further questions.

## 2020-05-15 ENCOUNTER — Other Ambulatory Visit: Payer: PPO

## 2020-05-15 ENCOUNTER — Encounter: Payer: Self-pay | Admitting: Internal Medicine

## 2020-05-18 ENCOUNTER — Ambulatory Visit: Payer: PPO

## 2020-05-18 ENCOUNTER — Telehealth: Payer: Self-pay

## 2020-05-18 NOTE — Telephone Encounter (Signed)
Patient is aware of below message and voiced her understanding.   Jessica Kaufman, please cancel echo and CT.

## 2020-05-18 NOTE — Telephone Encounter (Signed)
Patient is aware of date/time of covid test prior to PFT.   Patient is also scheduled for echo on 05/25/2020 and CT on 5/20/222. She had echo and CTA during recent admission.   Dr. Patsey Berthold, please advise. Thanks

## 2020-05-18 NOTE — Telephone Encounter (Signed)
Can cancel Echo and CT

## 2020-05-19 ENCOUNTER — Telehealth: Payer: Self-pay | Admitting: Pulmonary Disease

## 2020-05-19 ENCOUNTER — Telehealth: Payer: Self-pay | Admitting: Internal Medicine

## 2020-05-19 NOTE — Telephone Encounter (Addendum)
Spoke with the patients daughter [name].  [name] reports manual recheck of the patients BP 45 min after taking her second dose of Losartan 50mg  194/87 60bpm.  Cydney Ok of Dr. Darnelle Bos instructions. -Continue the patients current medications as prescribed. She was recently started on Hctz and it can take 1-2 weeks to see the effects. -Limit the amount of daily BP cehcks  Cydney Ok that the patient BP seems to be trending down. [name] sts that the patient is less anxious now and is asymptomatic.  Cydney Ok that they can contact the office if BP remains consistently elevated.  [name] is agreeable with the plan, verbalized understanding and voiced appreciation for the call back.

## 2020-05-19 NOTE — Telephone Encounter (Signed)
Pt c/o BP issue: STAT if pt c/o blurred vision, one-sided weakness or slurred speech  1. What are your last 5 BP readings? 05/19/20, 201/112, 191/109, 208/105  2. Are you having any other symptoms (ex. Dizziness, headache, blurred vision, passed out)? No   3. What is your BP issue? Higher then normal

## 2020-05-19 NOTE — Telephone Encounter (Addendum)
Incoming triage call received. Patient daughter called back a second time to report the patients elevated BP readings below. [name] sts that on 05/17/20 the patient did on 05/17/20 the patient did eat a lot of sodium filled foods. Patient was more mindful of her sodium intake yesterday.  Patient did take her morning medications this morning. Losartan 50 mg (she does take it bid) Metoprolol 25 mg  Hctz 12.5 mg  Patient denies any other changes. Her HRs have been in the high 50's-low 60's. Patient has 2 automatic BP machines and the readings have been similar on both.  Patient denies headache, change in vision or stroke like symptoms. Patient has been checking her BP every 2 hours.  Cydney Ok to have the patient sit and relax. She should take her PM Losartan 50 mg now. [name] is on her way to her moms house and will recheck her BP manually to check for accuracy. [name] will call back to report the reading.  Cydney Ok that I will fwd the update to Dr. Saunders Revel for further recomemndation.  Discussed with Dr. Saunders Revel. Patient should limit her sodium intake. Continue her medications as prescribed. Limit the amount of BP checks daily as this could be contributing to her elevated  BP readings.

## 2020-05-19 NOTE — Telephone Encounter (Signed)
I had to leave a message for [name]. I spoke with [name] in Pre admit testing. She stated that it would be fine for her to come at 8:30am instead of 10:00am

## 2020-05-19 NOTE — Telephone Encounter (Signed)
Patient leaving a new contact # [phone]

## 2020-05-19 NOTE — Telephone Encounter (Signed)
Jessica Kaufman, looks like this is a [place] pt.  Will you please reschedule this?

## 2020-05-21 ENCOUNTER — Ambulatory Visit: Payer: PPO | Admitting: Internal Medicine

## 2020-05-21 DIAGNOSIS — Z79899 Other long term (current) drug therapy: Secondary | ICD-10-CM | POA: Diagnosis not present

## 2020-05-21 DIAGNOSIS — R55 Syncope and collapse: Secondary | ICD-10-CM | POA: Diagnosis not present

## 2020-05-21 DIAGNOSIS — K30 Functional dyspepsia: Secondary | ICD-10-CM | POA: Diagnosis not present

## 2020-05-21 DIAGNOSIS — Z6835 Body mass index (BMI) 35.0-35.9, adult: Secondary | ICD-10-CM | POA: Diagnosis not present

## 2020-05-21 DIAGNOSIS — N958 Other specified menopausal and perimenopausal disorders: Secondary | ICD-10-CM | POA: Diagnosis not present

## 2020-05-21 DIAGNOSIS — R0989 Other specified symptoms and signs involving the circulatory and respiratory systems: Secondary | ICD-10-CM | POA: Diagnosis not present

## 2020-05-21 DIAGNOSIS — I480 Paroxysmal atrial fibrillation: Secondary | ICD-10-CM | POA: Diagnosis not present

## 2020-05-21 DIAGNOSIS — M069 Rheumatoid arthritis, unspecified: Secondary | ICD-10-CM | POA: Diagnosis not present

## 2020-05-22 ENCOUNTER — Ambulatory Visit: Payer: PPO

## 2020-05-22 ENCOUNTER — Other Ambulatory Visit
Admission: RE | Admit: 2020-05-22 | Discharge: 2020-05-22 | Disposition: A | Discharge status: Home / Self Care | Payer: PPO | Source: Ambulatory Visit | Attending: Pulmonary Disease | Admitting: Pulmonary Disease

## 2020-05-22 ENCOUNTER — Other Ambulatory Visit: Payer: Self-pay

## 2020-05-22 DIAGNOSIS — Z20822 Contact with and (suspected) exposure to covid-19: Secondary | ICD-10-CM | POA: Diagnosis not present

## 2020-05-22 DIAGNOSIS — Z01812 Encounter for preprocedural laboratory examination: Secondary | ICD-10-CM | POA: Diagnosis not present

## 2020-05-22 LAB — SARS CORONAVIRUS 2 (TAT 6-24 HRS): SARS Coronavirus 2: NEGATIVE

## 2020-05-25 ENCOUNTER — Ambulatory Visit: Payer: PPO | Attending: Pulmonary Disease

## 2020-05-25 ENCOUNTER — Other Ambulatory Visit: Payer: Self-pay

## 2020-05-25 ENCOUNTER — Ambulatory Visit: Payer: PPO

## 2020-05-25 DIAGNOSIS — R059 Cough, unspecified: Secondary | ICD-10-CM | POA: Diagnosis not present

## 2020-05-25 DIAGNOSIS — M069 Rheumatoid arthritis, unspecified: Secondary | ICD-10-CM | POA: Diagnosis not present

## 2020-05-25 DIAGNOSIS — Z79899 Other long term (current) drug therapy: Secondary | ICD-10-CM | POA: Diagnosis not present

## 2020-05-25 MED ORDER — ALBUTEROL SULFATE (2.5 MG/3ML) 0.083% IN NEBU
2.5000 mg | INHALATION_SOLUTION | Freq: Once | RESPIRATORY_TRACT | Status: AC
  Administered 2020-05-25: 2.5 mg via RESPIRATORY_TRACT
  Filled 2020-05-25: qty 3

## 2020-06-04 DIAGNOSIS — I493 Ventricular premature depolarization: Secondary | ICD-10-CM | POA: Diagnosis not present

## 2020-06-04 DIAGNOSIS — M069 Rheumatoid arthritis, unspecified: Secondary | ICD-10-CM | POA: Diagnosis not present

## 2020-06-04 DIAGNOSIS — Z85828 Personal history of other malignant neoplasm of skin: Secondary | ICD-10-CM | POA: Diagnosis not present

## 2020-06-04 DIAGNOSIS — I700 Atherosclerosis of aorta: Secondary | ICD-10-CM | POA: Diagnosis not present

## 2020-06-04 DIAGNOSIS — Z7951 Long term (current) use of inhaled steroids: Secondary | ICD-10-CM | POA: Diagnosis not present

## 2020-06-04 DIAGNOSIS — K5900 Constipation, unspecified: Secondary | ICD-10-CM | POA: Diagnosis not present

## 2020-06-04 DIAGNOSIS — I480 Paroxysmal atrial fibrillation: Secondary | ICD-10-CM | POA: Diagnosis not present

## 2020-06-04 DIAGNOSIS — I959 Hypotension, unspecified: Secondary | ICD-10-CM | POA: Diagnosis not present

## 2020-06-04 DIAGNOSIS — R55 Syncope and collapse: Secondary | ICD-10-CM | POA: Diagnosis not present

## 2020-06-04 DIAGNOSIS — E785 Hyperlipidemia, unspecified: Secondary | ICD-10-CM | POA: Diagnosis not present

## 2020-06-04 DIAGNOSIS — M109 Gout, unspecified: Secondary | ICD-10-CM | POA: Diagnosis not present

## 2020-06-04 DIAGNOSIS — J309 Allergic rhinitis, unspecified: Secondary | ICD-10-CM | POA: Diagnosis not present

## 2020-06-04 DIAGNOSIS — I719 Aortic aneurysm of unspecified site, without rupture: Secondary | ICD-10-CM | POA: Diagnosis not present

## 2020-06-04 DIAGNOSIS — E669 Obesity, unspecified: Secondary | ICD-10-CM | POA: Diagnosis not present

## 2020-06-04 DIAGNOSIS — J45909 Unspecified asthma, uncomplicated: Secondary | ICD-10-CM | POA: Diagnosis not present

## 2020-06-04 DIAGNOSIS — I10 Essential (primary) hypertension: Secondary | ICD-10-CM | POA: Diagnosis not present

## 2020-06-04 DIAGNOSIS — E039 Hypothyroidism, unspecified: Secondary | ICD-10-CM | POA: Diagnosis not present

## 2020-06-10 ENCOUNTER — Ambulatory Visit: Payer: PPO | Admitting: Pulmonary Disease

## 2020-06-10 ENCOUNTER — Other Ambulatory Visit: Payer: Self-pay

## 2020-06-10 ENCOUNTER — Encounter: Payer: Self-pay | Admitting: Pulmonary Disease

## 2020-06-10 VITALS — BP 128/80 | HR 54 | Temp 97.5°F | Ht 61.0 in | Wt 209.0 lb

## 2020-06-10 DIAGNOSIS — I350 Nonrheumatic aortic (valve) stenosis: Secondary | ICD-10-CM

## 2020-06-10 DIAGNOSIS — M059 Rheumatoid arthritis with rheumatoid factor, unspecified: Secondary | ICD-10-CM | POA: Diagnosis not present

## 2020-06-10 DIAGNOSIS — J4540 Moderate persistent asthma, uncomplicated: Secondary | ICD-10-CM | POA: Diagnosis not present

## 2020-06-10 DIAGNOSIS — R053 Chronic cough: Secondary | ICD-10-CM

## 2020-06-10 MED ORDER — TRELEGY ELLIPTA 100-62.5-25 MCG/INH IN AEPB: 100.0000 ug | INHALATION_SPRAY | Freq: Every day | RESPIRATORY_TRACT | 4 refills | Status: DC

## 2020-06-10 MED ORDER — TRELEGY ELLIPTA 100-62.5-25 MCG/INH IN AEPB: 1.0000 | INHALATION_SPRAY | Freq: Every day | RESPIRATORY_TRACT | 0 refills | Status: AC

## 2020-06-10 NOTE — Progress Notes (Signed)
Subjective:    Patient ID: Jessica Kaufman, female    DOB: March 25, 1925, 87 y.o.   MRN: [id number] Chief Complaint  Patient presents with   Follow-up    C/o occ sob with exertion.     HPI Patient is a delightful 87 year old lifelong never smoker who follows for the issue of chronic cough and occasional shortness of breath with exertion.  She was initially seen here on 05 May 2020.  This is a scheduled follow-up visit.  Recall that she has had a cough since around November 2021, she had a sputum culture during that time that was positive for Pseudomonas.  She has had no hemoptysis or sputum production.  She was given a trial of Trelegy Ellipta and is doing remarkably well with this.  She has noted that this helps resolve her cough.  She still has occasional shortness of breath however her PFTs do not explain the reason for her shortness of breath as they are relatively benign.  Suspect that this is more related to her aortic stenosis issues.  She did have some issues with gastroesophageal reflux but these are controlled now with antireflux measures.  He does not endorse any other complaint today.  Overall she feels well and looks well.  Review of Systems A 10 point review of systems was performed and it is as noted above otherwise negative.  Patient Active Problem List   Diagnosis Date Noted   Atrial fibrillation with RVR ([place]) 05/07/2020   Gout    Hypothyroidism    Rheumatoid arthritis ([place])    Syncope    Elevated troponin    Hypomagnesemia    Atypical chest pain 10/03/2019   Aortic valve stenosis 10/03/2019   Palpitations 04/04/2019   Dizziness 04/04/2019   PSVT (paroxysmal supraventricular tachycardia) ([place]) 04/04/2019   Near syncope 02/20/2019   Labile hypertension 12/12/2017   Paroxysmal atrial fibrillation ([facility]) 12/12/2017   Aortic valve disease 12/12/2017   Social History   Tobacco Use   Smoking status: Never Smoker   Smokeless tobacco: Never Used  Substance Use Topics    Alcohol use: No   Social History   Tobacco Use   Smoking status: Never   Smokeless tobacco: Never  Substance Use Topics   Alcohol use: No   Allergies  Allergen Reactions   Metronidazole Other (See Comments)    RASH   Sulindac Other (See Comments)    UNKNOWN   Naproxen Rash   Current Meds  Medication Sig   acetaminophen (TYLENOL) 650 MG CR tablet Take 650 mg by mouth every 8 (eight) hours as needed for pain.   allopurinol (ZYLOPRIM) 300 MG tablet Take 1 tablet by mouth every other day.    amoxicillin (AMOXIL) 500 MG tablet Take 4 tablets (2,000 mg total) by mouth 2 (two) times daily. For dental procedures only   apixaban (ELIQUIS) 5 MG TABS tablet Take 5 mg by mouth 2 (two) times daily.    b complex vitamins tablet Take 1 tablet by mouth daily.   Bilberry, Vaccinium myrtillus, 1000 MG CAPS Take 2 capsules by mouth daily.   Bioflavonoid Products (BIOFLEX) TABS Take 2 tablets by mouth daily.   Cholecalciferol (VITAMIN D3) 1000 UNITS CAPS Take 1 capsule by mouth daily.   Coenzyme Q10 (COQ10) 100 MG CAPS Take 1 capsule by mouth daily.   CRANBERRY PO Take 1 tablet by mouth daily.   fexofenadine (ALLEGRA) 180 MG tablet Take 180 mg by mouth daily.   [Fluticasone-Umeclidin-Vilant (TRELEGY ELLIPTA) 100-62.5-25 MCG/INH  AEPB Inhale 1 puff into the lungs daily for 1 day.   folic acid (FOLVITE) 1 MG tablet Take by mouth daily.    gabapentin (NEURONTIN) 600 MG tablet Take 600 mg by mouth 2 (two) times daily.   Ginger, Zingiber officinalis, (GINGER ROOT PO) Take by mouth daily.   levothyroxine (SYNTHROID, LEVOTHROID) 50 MCG tablet Take 1 tablet by mouth daily.   methotrexate (RHEUMATREX) 2.5 MG tablet Take 12.5 mg by mouth once a week. Takes 5 tablets weekly on Saturday   Multiple Vitamins-Minerals (CENTRUM SILVER PO) Take 1 tablet by mouth daily.   Multiple Vitamins-Minerals (PRESERVISION AREDS 2 PO) Take 1 capsule by mouth 2 (two) times daily.    Omega-3 Fatty Acids (FISH OIL) 1000 MG  CAPS Take 2 capsules by mouth daily.   oxybutynin (DITROPAN-XL) 10 MG 24 hr tablet Take 10 mg by mouth daily.   PREMARIN vaginal cream Place 1 Applicatorful vaginally in the morning and at bedtime.    psyllium (METAMUCIL) 58.6 % powder Take 1 packet by mouth daily.   Pyridoxine HCl (VITAMIN B6 PO) Take 1,000 mg by mouth daily.   TURMERIC PO Take by mouth daily.   [DISCONTINUED] hydrochlorothiazide (MICROZIDE) 12.5 MG capsule Take 1 capsule (12.5 mg total) by mouth daily.   [DISCONTINUED] losartan (COZAAR) 50 MG tablet Take 1 tablet by mouth twice a day   [DISCONTINUED] metoprolol succinate (TOPROL-XL) 25 MG 24 hr tablet Take 1 tablet (25 mg total) by mouth at bedtime.        Objective:   Physical Exam BP 128/80 (BP Location: Left Arm, Cuff Size: Normal)   Pulse (!) 54   Temp (!) 97.5 F (36.4 C) (Temporal)   Ht 5\' 1"  (1.549 m)   Wt 209 lb (94.8 kg)   SpO2 99%   BMI 39.49 kg/m  BP 128/80 (BP Location: Left Arm, Cuff Size: Normal)   Pulse (!) 54   Temp (!) 97.5 F (36.4 C) (Temporal)   Ht 5\' 1"  (1.549 m)   Wt 209 lb (94.8 kg)   SpO2 99%   BMI 39.49 kg/m  GENERAL: Obese woman, no acute distress, spry, looks younger than stated age.  Occasional congested sounding cough. HEAD: Normocephalic, atraumatic.  EYES: Pupils equal, round, reactive to light.  No scleral icterus.  MOUTH: Nose/mouth/throat not examined due to masking requirements for COVID 19. NECK: Supple. No thyromegaly. Trachea midline. No JVD.  No adenopathy. PULMONARY: Good air entry bilaterally.  Scattered with rhonchi throughout.  No wheezes CARDIOVASCULAR: S1 and S2. Regular rate and rhythm.  Grade 2/6 to 3/6 holosystolic murmur at left sternal border consistent with AS. ABDOMEN: Obese, otherwise benign. MUSCULOSKELETAL: No joint deformity, no clubbing, no edema.  NEUROLOGIC: No focal deficit, no gait disturbance, speech is fluent. SKIN: Intact,warm,dry.  On limited exam, no rashes. PSYCH: Mood and behavior  normal.       Assessment & Plan:     ICD-10-CM   1. Moderate persistent asthmatic bronchitis without complication  N00.37    PFTs were essentially benign She notes improvement with Trelegy Continue Trelegy    2. Chronic cough  R05.3    Improved with the use of Trelegy    3. Aortic valve stenosis, etiology of cardiac valve disease unspecified  I35.0    This issue adds complexity to her management May be adding to the sensation of dyspnea Follow-up with cardiology    4. Seropositive rheumatoid arthritis ([place])  M05.9    This issue adds complexity to her management Follows  with rheumatology     Meds ordered this encounter  Medications   Fluticasone-Umeclidin-Vilant (TRELEGY ELLIPTA) 100-62.5-25 MCG/INH AEPB    Sig: Inhale 100 mcg into the lungs daily.    Dispense:  90 each    Refill:  4    Order Specific Question:   Lot Number?    Answer:   [id number]    Order Specific Question:   Expiration Date?    Answer:   01/03/2022    Order Specific Question:   Quantity    Answer:   1   Fluticasone-Umeclidin-Vilant (TRELEGY ELLIPTA) 100-62.5-25 MCG/INH AEPB    Sig: Inhale 1 puff into the lungs daily for 1 day.    Dispense:  14 each    Refill:  0    Order Specific Question:   Lot Number?    Answer:   [id number]    Order Specific Question:   Expiration Date?    Answer:   01/03/2022    Order Specific Question:   Manufacturer?    Answer:   [employer] [[id number]]    Order Specific Question:   Quantity    Answer:   1   Patient's PFTs were reassuring, she does have some issues with asthmatic bronchitis however and does seem to improve with Trelegy.  We will continue this for now as it is giving her significant relief.  We provided her with samples and sent in the prescription to her mail order pharmacy.  We will see her in follow-up in 3 to 4 months time she is to contact us prior to that time should any new difficulties arise.  Renold Don, MD [facility]    *This note was dictated using voice recognition software/Dragon.  Despite best efforts to proofread, errors can occur which can change the meaning.  Any change was purely unintentional.

## 2020-06-10 NOTE — Patient Instructions (Signed)
Continue using Trelegy as you have been doing.  A prescription to your mail order pharmacy was sent.  We will see you in follow-up in 3 to 4 months time.

## 2020-06-15 DIAGNOSIS — H04123 Dry eye syndrome of bilateral lacrimal glands: Secondary | ICD-10-CM | POA: Diagnosis not present

## 2020-06-15 DIAGNOSIS — H579 Unspecified disorder of eye and adnexa: Secondary | ICD-10-CM | POA: Diagnosis not present

## 2020-06-15 DIAGNOSIS — Z6835 Body mass index (BMI) 35.0-35.9, adult: Secondary | ICD-10-CM | POA: Diagnosis not present

## 2020-06-16 NOTE — Progress Notes (Deleted)
Cardiology Office Note    Date:  06/16/2020   ID:  Jessica Kaufman, DOB 02/10/1925, MRN [id number]  PCP:  Leonides Sake, MD  Cardiologist:  Nelva Bush, MD  Electrophysiologist:  None   Chief Complaint: Follow up  History of Present Illness:   Jessica Kaufman is a 87 y.o. female with history of  PAF on Eliquis, moderate aortic stenosis, labile hypertension, ascending thoracic aortic aneurysm, PVCs, syncope, HLD, falls, and RA who presents for follow up of labile hypertension.  Jessica Kaufman underwent Zio patch in 02/2019 in the setting of dizziness with presyncope that showed a predominant rhythm of sinus with an average rate of 62 bpm range 41 to 107 bpm in sinus, rare PACs and PVCs, 39 episodes of paroxysmal SVT lasting up to 10 beats with a maximum rate of 184 bpm, and brief episodes of junctional rhythm often in the setting of PACs and PVCs.  Patient triggered events corresponded to sinus rhythm, PACs, and PVCs with transient junctional rhythm.  Echo in 03/2019 showed an EF of 55 to 60%, no regional wall motion abnormalities, normal LV diastolic function parameters, normal RV systolic function and ventricular cavity size, trivial mitral regurgitation, moderate aortic valve stenosis, and an estimated right atrial pressure of 3 mmHg.   She was seen in the office in 02/2020 and noted falls over the preceding month in the context of losing her balance as well as occasional orthostatic lightheadedness.    She was admitted to the hospital in 05/2020 with fatigue and syncope following EMS arrival.  She was noted to be in A. fib with RVR.  High-sensitivity troponin was minimally elevated and flat trending with a peak of 96.  BNP 179.  Echo showed an EF of 50 to 55%, mild LVH, indeterminate LV diastolic function parameters, normal RV systolic function and ventricular cavity size, mild mitral regurgitation, moderate aortic stenosis, and an estimated right atrial pressure of 8 mmHg.  CTA chest  was negative for PE with incidental aneurysmal dilatation of the ascending thoracic aorta measuring 4.0 cm in diameter noted.  HCTZ was discontinued.  She was seen in hospital follow-up in 05/2020 noting her BP was higher than normal since hospital discharge with readings frequently in the 263Z to 858I systolic.  She also noted mild swelling in her feet, though this was near her approximate baseline.  Her EKG showed a sinus rhythm with a rate of 62 bpm.  Her BP was elevated at 160/90.  Initially, low-dose amlodipine was recommended, though deferred by patient and daughter.  They preferred to restart HCTZ and risks of recurrent lightheadedness were discussed.  She was restarted on a lower dose HCTZ 12.5 mg (previously 25 mg).   ***   Labs independently reviewed: 05/2020 - magnesium 2.0, Hgb 12.5, PLT 251, potassium 3.7, BUN 15, serum creatinine 0.46, TSH normal, TC 199, TG 88, HDL 59, LDL 122, A1c 5.5  Past Medical History:  Diagnosis Date   Aortic valve disease    Asthma    Cancer ([place])    basal cell    Gout    Hyperlipidemia    Hypertension    Hypertensive heart disease without heart failure    Hypothyroidism    PAF (paroxysmal atrial fibrillation) ([facility])    PVC (premature ventricular contraction)    Rheumatoid arthritis ([facility])    Ventricular premature depolarization     Past Surgical History:  Procedure Laterality Date   BLADDER SURGERY  10/19/2010   BONY PELVIS SURGERY  [place] SURGERY  2009   PVC   CATARACT EXTRACTION Left 11/15/00   CATARACT EXTRACTION Right    ELBOW SURGERY  06/1990   HIP SURGERY Left 05/28/09   HIP SURGERY Right 06/30/03   HYSTEROSCOPY  06/17/1986   vaginal    Current Medications: No outpatient medications have been marked as taking for the 06/22/20 encounter (Appointment) with Rise Mu, PA-C.    Allergies:   Metronidazole, Sulindac, and Naproxen   Social History   Socioeconomic History   Marital status: Widowed     Spouse name: Not on file   Number of children: 7   Years of education: 12th   Highest education level: Not on file  Occupational History   Occupation: Retired  Tobacco Use   Smoking status: Never   Smokeless tobacco: Never  Vaping Use   Vaping Use: Never used  Substance and Sexual Activity   Alcohol use: No   Drug use: No   Sexual activity: Not on file  Other Topics Concern   Not on file  Social History Narrative   Patient lives at home alone.   Caffeine Use: 1 cup daily   7 children (1 deceased)   Social Determinants of Health   Financial Resource Strain: Not on file  Food Insecurity: Not on file  Transportation Needs: Not on file  Physical Activity: Not on file  Stress: Not on file  Social Connections: Not on file     Family History:  The patient's family history includes Breast cancer in her daughter; Heart attack in her father and maternal grandmother; Heart failure in her sister; Melanoma in her mother and sister; Stroke in her mother; Uterine cancer in her sister.  ROS:   ROS   EKGs/Labs/Other Studies Reviewed:    Studies reviewed were summarized above. The additional studies were reviewed today:  2D echo 05/08/2020: 1. Left ventricular ejection fraction, by estimation, is 50 to 55%. The  left ventricle has low normal function. Left ventricular endocardial  border not optimally defined to evaluate regional wall motion. There is  mild left ventricular hypertrophy. Left  ventricular diastolic parameters are indeterminate.   2. Right ventricular systolic function is normal. The right ventricular  size is normal.   3. The mitral valve is normal in structure. Mild mitral valve  regurgitation. No evidence of mitral stenosis.   4. The aortic valve was not well visualized. Aortic valve regurgitation  is not visualized. Moderate aortic valve stenosis. Aortic gradient does  not seem accurate. Aortic stenosis seems at least moderate based on valve  morphology and  restricted opening.   5. The inferior vena cava is normal in size with <50% respiratory  variability, suggesting right atrial pressure of 8 mmHg.  __________  2D echo 04/03/2019: 1. Left ventricular ejection fraction, by estimation, is 55 to 60%. The  left ventricle has normal function. The left ventricle has no regional  wall motion abnormalities. Left ventricular diastolic parameters were  normal.   2. Right ventricular systolic function is normal. The right ventricular  size is normal.   3. The mitral valve is grossly normal. Trivial mitral valve  regurgitation.   4. The aortic valve was not well visualized. Aortic valve regurgitation  is not visualized. Moderate aortic valve stenosis.   5. The inferior vena cava is normal in size with greater than 50%  respiratory variability, suggesting right atrial pressure of 3 mmHg. __________  Elwyn Reach patch 02/2019: The  patient was monitored for 13 days, 23 hours. The predominant rhythm was sinus with an average rate of 62 bpm (range 41 to 107 bpm in sinus). Rare PACs and PVCs occurred. There were 39 episodes of paroxysmal supraventricular tachycardia lasting up to 10 beats with a maximum rate of 184 bpm. Brief episodes of junctional rhythm occurred, often in the setting of PACs and PVCs. Patient triggered events correspond to sinus rhythm, PACs, and PVCs transient junctional rhythm.   Predominately sinus rhythm with rare PACs and PVCs.  39 episodes of brief PSVT occurred, as well as transient junctional rhythm (often in the setting of PACs and PVCs).   EKG:  EKG is ordered today.  The EKG ordered today demonstrates ***  Recent Labs: 10/02/2019: ALT 21 05/07/2020: B Natriuretic Peptide 179.8 05/08/2020: BUN 15; Creatinine, Ser 0.46; Hemoglobin 12.5; Magnesium 2.0; Platelets 251; Potassium 3.7; Sodium 139; TSH 1.024  Recent Lipid Panel    Component Value Date/Time   CHOL 199 05/08/2020 0522   TRIG 88 05/08/2020 0522   HDL 59 05/08/2020 0522    CHOLHDL 3.4 05/08/2020 0522   VLDL 18 05/08/2020 0522   LDLCALC 122 (H) 05/08/2020 0522    PHYSICAL EXAM:    VS:  There were no vitals taken for this visit.  BMI: There is no height or weight on file to calculate BMI.  Physical Exam  Wt Readings from Last 3 Encounters:  06/10/20 209 lb (94.8 kg)  05/13/20 214 lb (97.1 kg)  05/09/20 212 lb 12.8 oz (96.5 kg)     ASSESSMENT & PLAN:   Syncope:  PAF:  Labile hypertension: Blood pressure ***  Aortic stenosis:  Abnormal EKG:  Ascending thoracic aortic aneurysm:  Disposition: F/u with Dr. Saunders Revel or an APP in ***.   Medication Adjustments/Labs and Tests Ordered: Current medicines are reviewed at length with the patient today.  Concerns regarding medicines are outlined above. Medication changes, Labs and Tests ordered today are summarized above and listed in the Patient Instructions accessible in Encounters.   Signed, Christell Faith, PA-C 06/16/2020 2:57 PM     [facility] [address] [place], [place] [address] [phone]

## 2020-06-22 ENCOUNTER — Ambulatory Visit: Payer: PPO | Admitting: Physician Assistant

## 2020-06-25 ENCOUNTER — Telehealth: Payer: Self-pay | Admitting: Pulmonary Disease

## 2020-06-25 MED ORDER — TRELEGY ELLIPTA 100-62.5-25 MCG/INH IN AEPB: 100.0000 ug | INHALATION_SPRAY | Freq: Every day | RESPIRATORY_TRACT | 3 refills | Status: DC

## 2020-06-25 NOTE — Telephone Encounter (Signed)
Rx for Trelegy 100 has been sent to preferred pharmacy.  Patient's daughter, Eloise Harman) is aware and voiced her understanding.  Nothing further needed.

## 2020-07-02 ENCOUNTER — Telehealth: Payer: Self-pay | Admitting: Internal Medicine

## 2020-07-03 DIAGNOSIS — I959 Hypotension, unspecified: Secondary | ICD-10-CM | POA: Diagnosis not present

## 2020-07-03 DIAGNOSIS — K5900 Constipation, unspecified: Secondary | ICD-10-CM | POA: Diagnosis not present

## 2020-07-03 DIAGNOSIS — I700 Atherosclerosis of aorta: Secondary | ICD-10-CM | POA: Diagnosis not present

## 2020-07-03 DIAGNOSIS — I493 Ventricular premature depolarization: Secondary | ICD-10-CM | POA: Diagnosis not present

## 2020-07-03 DIAGNOSIS — R55 Syncope and collapse: Secondary | ICD-10-CM | POA: Diagnosis not present

## 2020-07-03 DIAGNOSIS — M069 Rheumatoid arthritis, unspecified: Secondary | ICD-10-CM | POA: Diagnosis not present

## 2020-07-03 DIAGNOSIS — E669 Obesity, unspecified: Secondary | ICD-10-CM | POA: Diagnosis not present

## 2020-07-03 DIAGNOSIS — M109 Gout, unspecified: Secondary | ICD-10-CM | POA: Diagnosis not present

## 2020-07-03 DIAGNOSIS — Z7951 Long term (current) use of inhaled steroids: Secondary | ICD-10-CM | POA: Diagnosis not present

## 2020-07-03 DIAGNOSIS — J45909 Unspecified asthma, uncomplicated: Secondary | ICD-10-CM | POA: Diagnosis not present

## 2020-07-03 DIAGNOSIS — Z85828 Personal history of other malignant neoplasm of skin: Secondary | ICD-10-CM | POA: Diagnosis not present

## 2020-07-03 DIAGNOSIS — E785 Hyperlipidemia, unspecified: Secondary | ICD-10-CM | POA: Diagnosis not present

## 2020-07-03 DIAGNOSIS — J309 Allergic rhinitis, unspecified: Secondary | ICD-10-CM | POA: Diagnosis not present

## 2020-07-03 DIAGNOSIS — I480 Paroxysmal atrial fibrillation: Secondary | ICD-10-CM | POA: Diagnosis not present

## 2020-07-03 DIAGNOSIS — I719 Aortic aneurysm of unspecified site, without rupture: Secondary | ICD-10-CM | POA: Diagnosis not present

## 2020-07-03 DIAGNOSIS — I10 Essential (primary) hypertension: Secondary | ICD-10-CM | POA: Diagnosis not present

## 2020-07-03 DIAGNOSIS — E039 Hypothyroidism, unspecified: Secondary | ICD-10-CM | POA: Diagnosis not present

## 2020-07-07 ENCOUNTER — Other Ambulatory Visit: Payer: Self-pay | Admitting: Internal Medicine

## 2020-07-08 NOTE — Telephone Encounter (Signed)
Left voicemail message to call back for review of recommendations. Left message to hold metoprolol until we see her next week and to please call tomorrow for review.

## 2020-07-08 NOTE — Telephone Encounter (Signed)
STAT if HR is under 50 or over 120 (normal HR is 60-100 beats per minute)  What is your heart rate? 51 today  Do you have a log of your heart rate readings (document readings)? All last week was in the 40s, max 48     Do you have any other symptoms? Some dizziness, but nothing new. Feels weak when it is lowl Please call to discuss.

## 2020-07-08 NOTE — Telephone Encounter (Signed)
I recommend holding metoprolol and following up with Christell Faith, PA, next week as scheduled.  Nelva Bush, MD [facility]

## 2020-07-08 NOTE — Telephone Encounter (Signed)
Spoke with patients daughter per release form. She called in back on 07/02/20 but it did not go to triage or nurse. Apologies given to her and she was understanding.   She states that patients heart rates have been running 40's to 50's last week and on Monday it was down to 41. Reports that during these low rates that she has weakness. She did report dizziness but this is not new. Today it was 60 and she is feeling better. She stated that when heart rates are low she "feels like legs don't want to move". She had patient push fluids and mentioned that her systolic blood pressure readings have been 150's to 160's but that they wanted her blood pressures to run on the high side. Concerned that heart rates are low and wanted to check in for recommendations.   Advised that I would send to provider for his review and we would be in touch with any recommendations. She verbalized understanding with no further questions at this time.

## 2020-07-09 NOTE — Telephone Encounter (Signed)
Spoke with patients daughter per release form. Reviewed recommendations from Dr. Saunders Revel and confirmed appointment for next week. She states patient is feeling better today. Instructed her to HOLD metoprolol until appointment. She verbalized understanding with no further questions at this time.

## 2020-07-09 NOTE — Telephone Encounter (Signed)
Left voicemail message to call back for review of recommendations.  

## 2020-07-11 NOTE — Progress Notes (Deleted)
Cardiology Office Note    Date:  07/11/2020   ID:  Jessica Kaufman, DOB 1925/10/03, MRN [id number]  PCP:  Jessica Sake, MD  Cardiologist:  Jessica Bush, MD  Electrophysiologist:  None   Chief Complaint: Follow-up  History of Present Illness:   Jessica Kaufman is a 87 y.o. female with history of PAF on Eliquis, moderate aortic stenosis, labile hypertension, ascending thoracic aortic aneurysm, PVCs, syncope, HLD, falls, and RA who presents for follow up of labile hypertension.  Jessica Kaufman underwent Zio patch in 02/2019 in the setting of dizziness with presyncope that showed a predominant rhythm of sinus with an average rate of 62 bpm range 41 to 107 bpm in sinus, rare PACs and PVCs, 39 episodes of paroxysmal SVT lasting up to 10 beats with a maximum rate of 184 bpm, and brief episodes of junctional rhythm often in the setting of PACs and PVCs.  Patient triggered events corresponded to sinus rhythm, PACs, and PVCs with transient junctional rhythm.  Echo in 03/2019 showed an EF of 55 to 60%, no regional wall motion abnormalities, normal LV diastolic function parameters, normal RV systolic function and ventricular cavity size, trivial mitral regurgitation, moderate aortic valve stenosis, and an estimated right atrial pressure of 3 mmHg.   She was seen in the office in 02/2020 and noted falls over the preceding month in the context of losing her balance as well as occasional orthostatic lightheadedness.    She was admitted to the hospital in 05/2020 with fatigue and syncope following EMS arrival.  She was noted to be in A. fib with RVR.  High-sensitivity troponin was minimally elevated and flat trending with a peak of 96.  BNP 179.  Echo showed an EF of 50 to 55%, mild LVH, indeterminate LV diastolic function parameters, normal RV systolic function and ventricular cavity size, mild mitral regurgitation, moderate aortic stenosis, and an estimated right atrial pressure of 8 mmHg.  CTA chest  was negative for PE with incidental aneurysmal dilatation of the ascending thoracic aorta measuring 4.0 cm in diameter noted.  HCTZ was discontinued.  She was seen in hospital follow-up in 05/2020 noting her BP was higher than normal since hospital discharge with readings frequently in the 952W to 413K systolic.  She also noted mild swelling in her feet, though this was near her approximate baseline.  Her EKG showed a sinus rhythm with a rate of 62 bpm.  Her BP was elevated at 160/90.  Initially, low-dose amlodipine was recommended, though deferred by patient and daughter.  They preferred to restart HCTZ and risks of recurrent lightheadedness were discussed.  She was restarted on a lower dose HCTZ 12.5 mg (previously 25 mg).  ***   Labs independently reviewed: 05/2020 - magnesium 2.0, Hgb 12.5, PLT 251, potassium 3.7, BUN 15, serum creatinine 0.46, TSH normal, TC 199, TG 88, HDL 59, LDL 122, A1c 5.5  Past Medical History:  Diagnosis Date   Aortic valve disease    Asthma    Cancer ([place])    basal cell    Gout    Hyperlipidemia    Hypertension    Hypertensive heart disease without heart failure    Hypothyroidism    PAF (paroxysmal atrial fibrillation) (HCC)    PVC (premature ventricular contraction)    Rheumatoid arthritis ([place])    Ventricular premature depolarization     Past Surgical History:  Procedure Laterality Date   BLADDER SURGERY  10/19/2010   [place]  BREAST BIOPSY Left 1986   CARDIAC SURGERY  2009   PVC   CATARACT EXTRACTION Left 11/15/00   CATARACT EXTRACTION Right    ELBOW SURGERY  06/1990   HIP SURGERY Left 05/28/09   HIP SURGERY Right 06/30/03   HYSTEROSCOPY  06/17/1986   vaginal    Current Medications: No outpatient medications have been marked as taking for the 07/16/20 encounter (Appointment) with Rise Mu, PA-C.    Allergies:   Metronidazole, Sulindac, and Naproxen   Social History   Socioeconomic History   Marital status: Widowed     Spouse name: Not on file   Number of children: 7   Years of education: 12th   Highest education level: Not on file  Occupational History   Occupation: Retired  Tobacco Use   Smoking status: Never   Smokeless tobacco: Never  Vaping Use   Vaping Use: Never used  Substance and Sexual Activity   Alcohol use: No   Drug use: No   Sexual activity: Not on file  Other Topics Concern   Not on file  Social History Narrative   Patient lives at home alone.   Caffeine Use: 1 cup daily   7 children (1 deceased)   Social Determinants of Health   Financial Resource Strain: Not on file  Food Insecurity: Not on file  Transportation Needs: Not on file  Physical Activity: Not on file  Stress: Not on file  Social Connections: Not on file     Family History:  The patient's family history includes Breast cancer in her daughter; Heart attack in her father and maternal grandmother; Heart failure in her sister; Melanoma in her mother and sister; Stroke in her mother; Uterine cancer in her sister.  ROS:   ROS   EKGs/Labs/Other Studies Reviewed:    Studies reviewed were summarized above. The additional studies were reviewed today:  2D echo 05/2020: 1. Left ventricular ejection fraction, by estimation, is 50 to 55%. The  left ventricle has low normal function. Left ventricular endocardial  border not optimally defined to evaluate regional wall motion. There is  mild left ventricular hypertrophy. Left  ventricular diastolic parameters are indeterminate.   2. Right ventricular systolic function is normal. The right ventricular  size is normal.   3. The mitral valve is normal in structure. Mild mitral valve  regurgitation. No evidence of mitral stenosis.   4. The aortic valve was not well visualized. Aortic valve regurgitation  is not visualized. Moderate aortic valve stenosis. Aortic gradient does  not seem accurate. Aortic stenosis seems at least moderate based on valve  morphology and  restricted opening.   5. The inferior vena cava is normal in size with <50% respiratory  variability, suggesting right atrial pressure of 8 mmHg.  __________  2D echo 03/2019: 1. Left ventricular ejection fraction, by estimation, is 55 to 60%. The  left ventricle has normal function. The left ventricle has no regional  wall motion abnormalities. Left ventricular diastolic parameters were  normal.   2. Right ventricular systolic function is normal. The right ventricular  size is normal.   3. The mitral valve is grossly normal. Trivial mitral valve  regurgitation.   4. The aortic valve was not well visualized. Aortic valve regurgitation  is not visualized. Moderate aortic valve stenosis.   5. The inferior vena cava is normal in size with greater than 50%  respiratory variability, suggesting right atrial pressure of 3 mmHg. __________  Elwyn Reach patch 02/2019: The patient was monitored  for 13 days, 23 hours. The predominant rhythm was sinus with an average rate of 62 bpm (range 41 to 107 bpm in sinus). Rare PACs and PVCs occurred. There were 39 episodes of paroxysmal supraventricular tachycardia lasting up to 10 beats with a maximum rate of 184 bpm. Brief episodes of junctional rhythm occurred, often in the setting of PACs and PVCs. Patient triggered events correspond to sinus rhythm, PACs, and PVCs transient junctional rhythm.   Predominately sinus rhythm with rare PACs and PVCs.  39 episodes of brief PSVT occurred, as well as transient junctional rhythm (often in the setting of PACs and PVCs).   EKG:  EKG is ordered today.  The EKG ordered today demonstrates ***  Recent Labs: 10/02/2019: ALT 21 05/07/2020: B Natriuretic Peptide 179.8 05/08/2020: BUN 15; Creatinine, Ser 0.46; Hemoglobin 12.5; Magnesium 2.0; Platelets 251; Potassium 3.7; Sodium 139; TSH 1.024  Recent Lipid Panel    Component Value Date/Time   CHOL 199 05/08/2020 0522   TRIG 88 05/08/2020 0522   HDL 59 05/08/2020 0522    CHOLHDL 3.4 05/08/2020 0522   VLDL 18 05/08/2020 0522   LDLCALC 122 (H) 05/08/2020 0522    PHYSICAL EXAM:    VS:  There were no vitals taken for this visit.  BMI: There is no height or weight on file to calculate BMI.  Physical Exam  Wt Readings from Last 3 Encounters:  06/10/20 209 lb (94.8 kg)  05/13/20 214 lb (97.1 kg)  05/09/20 212 lb 12.8 oz (96.5 kg)     ASSESSMENT & PLAN:   Syncope:  PAF: ***.  CHA2DS2-VASc at least 5 (HTN, age x2, vascular disease, sex category)  Labile hypertension: Blood pressure ***  Aortic stenosis:  Aortic atherosclerosis/HLD:  Abnormal EKG:  Ascending thoracic aortic aneurysm:  Disposition: F/u with Dr. Saunders Revel or an APP in ***.   Medication Adjustments/Labs and Tests Ordered: Current medicines are reviewed at length with the patient today.  Concerns regarding medicines are outlined above. Medication changes, Labs and Tests ordered today are summarized above and listed in the Patient Instructions accessible in Encounters.   Signed, Christell Faith, PA-C 07/11/2020 1:38 PM     [place] [address] [place], [place] [address] [phone]

## 2020-07-13 ENCOUNTER — Telehealth: Payer: Self-pay | Admitting: Internal Medicine

## 2020-07-13 ENCOUNTER — Ambulatory Visit: Payer: PPO | Admitting: Pulmonary Disease

## 2020-07-13 NOTE — Telephone Encounter (Signed)
Pt c/o BP issue: STAT if pt c/o blurred vision, one-sided weakness or slurred speech  1. What are your last 5 BP readings? 201/108 pulse 62, 196/101  2. Are you having any other symptoms (ex. Dizziness, headache, blurred vision, passed out)? no  3. What is your BP issue? Feet & legs feel heavy

## 2020-07-13 NOTE — Telephone Encounter (Signed)
Spoke to pt's daughter, [name] [facility] approved).  At 0745 BP 201/108 HR 62 0800 took AM BP meds: HCTZ 12.5mg  and Losartan 50mg  0900 BP 196/101   Pt does "usually have blurred vision" per [name], and pt denies any changes to her baseline regarding vision. Denies HA, dizziness, or any additional symptoms other than "feet and legs feel heavy."  [name] reports "we are trying to keep her BP on the higher side to prevent drops in BP/HR."  Metoprolol Succinate 25mg  was held d/t low HR 40s/50s and symptomatic - see phone note 07/02/20.  Past few days BP 139/93, 161, 92, 142/86 and HR running 65-78 while not taking Toprol.   Pt does have follow up scheduled this week to see Christell Faith, PA on 07/16/20.   Advised pt to take HALF tablet of her Metoprolol Succinate 25mg  (for 12.5mg  total) now and continue to monitor BP/HR. Advised to call the office if BP does not come down and/or new symptoms develop.   [name] voiced understanding and has no further questions at this time.  Will forward to provider for any further recc.

## 2020-07-13 NOTE — Telephone Encounter (Signed)
Clinical staff have resumed metoprolol at 1/2 dose. With heart rates in the low 60s bpm not on metoprolol, I'm not certain she will be able to tolerate this. Would recommend she hold metoprolol and increase losartan to 75 mg daily. Follow up as planned.

## 2020-07-13 NOTE — Telephone Encounter (Signed)
[name] calling to update with a BP reading 07/13/20 at 4:35p 193/105 HR 65

## 2020-07-14 NOTE — Telephone Encounter (Signed)
Continue losartan 50 mg twice daily.  We can add carvedilol 3.125 mg twice daily.

## 2020-07-15 MED ORDER — CARVEDILOL 3.125 MG PO TABS: 3.1250 mg | ORAL_TABLET | Freq: Two times a day (BID) | ORAL | 1 refills | Status: DC

## 2020-07-15 NOTE — Telephone Encounter (Signed)
Spoke to [place], made her aware of medication change below.  [name] voiced understanding. She will have pt:  -  Continue Losartan 50mg  twice daily -  Start Carvedilol 3.125mg  twice daily  Rx sent to pt's local pharmacy, confirmed with [name].  Pt will continue to monitor BP/HR and pt or [name] will contact office if HR's consistently <60 and/or with any changes in symptoms.   [name] has no further questions at this time.  Pt will follow up as scheduled 08/05/20.

## 2020-07-16 ENCOUNTER — Ambulatory Visit: Payer: PPO | Admitting: Physician Assistant

## 2020-07-21 DIAGNOSIS — M25461 Effusion, right knee: Secondary | ICD-10-CM | POA: Diagnosis not present

## 2020-07-21 DIAGNOSIS — M0589 Other rheumatoid arthritis with rheumatoid factor of multiple sites: Secondary | ICD-10-CM | POA: Diagnosis not present

## 2020-07-21 DIAGNOSIS — M1711 Unilateral primary osteoarthritis, right knee: Secondary | ICD-10-CM | POA: Diagnosis not present

## 2020-07-21 DIAGNOSIS — Z79899 Other long term (current) drug therapy: Secondary | ICD-10-CM | POA: Diagnosis not present

## 2020-07-21 DIAGNOSIS — M1712 Unilateral primary osteoarthritis, left knee: Secondary | ICD-10-CM | POA: Diagnosis not present

## 2020-07-21 DIAGNOSIS — G629 Polyneuropathy, unspecified: Secondary | ICD-10-CM | POA: Diagnosis not present

## 2020-07-21 DIAGNOSIS — M19042 Primary osteoarthritis, left hand: Secondary | ICD-10-CM | POA: Diagnosis not present

## 2020-07-21 DIAGNOSIS — M25561 Pain in right knee: Secondary | ICD-10-CM | POA: Diagnosis not present

## 2020-07-21 DIAGNOSIS — M79641 Pain in right hand: Secondary | ICD-10-CM | POA: Diagnosis not present

## 2020-07-21 DIAGNOSIS — M79642 Pain in left hand: Secondary | ICD-10-CM | POA: Diagnosis not present

## 2020-07-21 DIAGNOSIS — E669 Obesity, unspecified: Secondary | ICD-10-CM | POA: Diagnosis not present

## 2020-07-21 DIAGNOSIS — M25562 Pain in left knee: Secondary | ICD-10-CM | POA: Diagnosis not present

## 2020-07-21 DIAGNOSIS — M19041 Primary osteoarthritis, right hand: Secondary | ICD-10-CM | POA: Diagnosis not present

## 2020-07-21 DIAGNOSIS — M1990 Unspecified osteoarthritis, unspecified site: Secondary | ICD-10-CM | POA: Diagnosis not present

## 2020-08-05 ENCOUNTER — Ambulatory Visit: Payer: PPO | Admitting: Internal Medicine

## 2020-08-05 ENCOUNTER — Encounter: Payer: Self-pay | Admitting: Internal Medicine

## 2020-08-05 ENCOUNTER — Other Ambulatory Visit: Payer: Self-pay

## 2020-08-05 VITALS — BP 140/90 | HR 60 | Ht 61.0 in | Wt 208.0 lb

## 2020-08-05 DIAGNOSIS — I480 Paroxysmal atrial fibrillation: Secondary | ICD-10-CM

## 2020-08-05 DIAGNOSIS — I350 Nonrheumatic aortic (valve) stenosis: Secondary | ICD-10-CM | POA: Diagnosis not present

## 2020-08-05 DIAGNOSIS — R0989 Other specified symptoms and signs involving the circulatory and respiratory systems: Secondary | ICD-10-CM

## 2020-08-05 DIAGNOSIS — Z79899 Other long term (current) drug therapy: Secondary | ICD-10-CM | POA: Diagnosis not present

## 2020-08-05 DIAGNOSIS — R42 Dizziness and giddiness: Secondary | ICD-10-CM | POA: Diagnosis not present

## 2020-08-05 MED ORDER — HYDROCHLOROTHIAZIDE 12.5 MG PO CAPS: 12.5000 mg | ORAL_CAPSULE | Freq: Every day | ORAL | 3 refills | Status: DC

## 2020-08-05 MED ORDER — CARVEDILOL 3.125 MG PO TABS: 3.1250 mg | ORAL_TABLET | Freq: Two times a day (BID) | ORAL | 3 refills | Status: DC

## 2020-08-05 MED ORDER — MECLIZINE HCL 12.5 MG PO TABS: 12.5000 mg | ORAL_TABLET | Freq: Three times a day (TID) | ORAL | 3 refills | Status: DC | PRN

## 2020-08-05 NOTE — Patient Instructions (Signed)
Medication Instructions:   Your physician has recommended you make the following change in your medication:   START Meclizine 12.5 mg - take every 8 hours AS NEEDED for dizziness  Refills have been sent to your mail order pharmacy today for: HCTZ and Carvedilol  *If you need a refill on your cardiac medications before your next appointment, please call your pharmacy*   Lab Work:  BMET today  If you have labs (blood work) drawn today and your tests are completely normal, you will receive your results only by: Carl (if you have MyChart) OR A paper copy in the mail If you have any lab test that is abnormal or we need to change your treatment, we will call you to review the results.   Testing/Procedures:  None ordered   Follow-Up: At [facility], you and your health needs are our priority.  As part of our continuing mission to provide you with exceptional heart care, we have created designated Provider Care Teams.  These Care Teams include your primary Cardiologist (physician) and Advanced Practice Providers (APPs -  Physician Assistants and Nurse Practitioners) who all work together to provide you with the care you need, when you need it.  We recommend signing up for the patient portal called "MyChart".  Sign up information is provided on this After Visit Summary.  MyChart is used to connect with patients for Virtual Visits (Telemedicine).  Patients are able to view lab/test results, encounter notes, upcoming appointments, etc.  Non-urgent messages can be sent to your provider as well.   To learn more about what you can do with MyChart, go to [email or web address].    Your next appointment:   3 month(s)  The format for your next appointment:   In Person  Provider:   You may see Nelva Bush, MD or one of the following Advanced Practice Providers on your designated Care Team:   Murray Hodgkins, NP Christell Faith, PA-C Marrianne Mood, PA-C Cadence [place],  [place]

## 2020-08-05 NOTE — Progress Notes (Signed)
Follow-up Outpatient Visit Date: 08/05/2020  Primary Care Provider: Leonides Sake, MD [address] [place] [address]  Chief Complaint: Dizziness  HPI:  Jessica Kaufman is a 87 y.o. female with history of  labile hypertension, paroxysmal atrial fibrillation, mild to moderate aortic stenosis, PVCs, hyperlipidemia, and rheumatoid arthritis, who presents for follow-up of hypertension and recent syncope.  I last saw her in May after hospitalization for syncope and fatigue during which time she was noted to be in atrial fibrillation with rapid ventricular response.  HCTZ was discontinued during that hospital stay with subsequent rise in blood pressure.  At our follow-up visit, we agreed to restart HCTZ at 12.5 mg daily.  Subsequent home readings have continued to be labile and frequently elevated, prompting escalation of losartan and addition of low-dose carvedilol.  Today, Jessica Kaufman is most concerned about chronic dizziness.  She feels off balance when she stands up and also when she turns her head.  She was previously advised that this was due to a vestibular problem.  She has participated in vestibular rehab and continues to perform maneuvers to help with symptoms with only modest relief.  She requests a trial of meclizine.  She otherwise feels relatively well with stable exertional dyspnea that improves when she uses her inhaler.  Overall, blood pressure control has been better since carvedilol was started.  She has not had any low or particularly high pressures, with systolic readings only going up to the 150 range.  --------------------------------------------------------------------------------------------------  Past Medical History:  Diagnosis Date   Aortic valve disease    Asthma    Cancer ([place])    basal cell    Gout    Hyperlipidemia    Hypertension    Hypertensive heart disease without heart failure    Hypothyroidism    PAF (paroxysmal atrial fibrillation) (HCC)     PVC (premature ventricular contraction)    Rheumatoid arthritis ([place])    Ventricular premature depolarization    Past Surgical History:  Procedure Laterality Date   BLADDER SURGERY  10/19/2010   [place]ONY PELVIS SURGERY  1990   BREAST BIOPSY Left 1986   CARDIAC SURGERY  2009   PVC   CATARACT EXTRACTION Left 11/15/00   CATARACT EXTRACTION Right    ELBOW SURGERY  06/1990   HIP SURGERY Left 05/28/09   HIP SURGERY Right 06/30/03   HYSTEROSCOPY  06/17/1986   vaginal    Current Meds  Medication Sig   acetaminophen (TYLENOL) 650 MG CR tablet Take 650 mg by mouth every 8 (eight) hours as needed for pain.   allopurinol (ZYLOPRIM) 300 MG tablet Take 1 tablet by mouth every other day.    amoxicillin (AMOXIL) 500 MG tablet Take 4 tablets (2,000 mg total) by mouth 2 (two) times daily. For dental procedures only   apixaban (ELIQUIS) 5 MG TABS tablet Take 5 mg by mouth 2 (two) times daily.    b complex vitamins tablet Take 1 tablet by mouth daily.   Bilberry, Vaccinium myrtillus, 1000 MG CAPS Take 2 capsules by mouth daily.   Bioflavonoid Products (BIOFLEX) TABS Take 2 tablets by mouth daily.   carvedilol (COREG) 3.125 MG tablet Take 1 tablet (3.125 mg total) by mouth 2 (two) times daily with a meal.   Cholecalciferol (VITAMIN D3) 1000 UNITS CAPS Take 1 capsule by mouth daily.   Coenzyme Q10 (COQ10) 100 MG CAPS Take 1 capsule by mouth daily.   CRANBERRY PO Take 1 tablet by mouth daily.   famotidine (  PEPCID) 20 MG tablet Take 20 mg by mouth daily.   fexofenadine (ALLEGRA) 180 MG tablet Take 180 mg by mouth daily.   Fluticasone-Umeclidin-Vilant (TRELEGY ELLIPTA) 100-62.5-25 MCG/INH AEPB Inhale 100 mcg into the lungs daily.   folic acid (FOLVITE) 1 MG tablet Take by mouth daily.    gabapentin (NEURONTIN) 600 MG tablet Take 600 mg by mouth 2 (two) times daily.   Ginger, Zingiber officinalis, (GINGER ROOT PO) Take by mouth daily.   hydrochlorothiazide (MICROZIDE) 12.5 MG capsule Take 1 capsule (12.5 mg  total) by mouth daily.   levothyroxine (SYNTHROID, LEVOTHROID) 50 MCG tablet Take 1 tablet by mouth daily.   losartan (COZAAR) 50 MG tablet Take 1 tablet by mouth twice a day   methotrexate (RHEUMATREX) 2.5 MG tablet Take 12.5 mg by mouth once a week. Takes 5 tablets weekly on Saturday   metoprolol succinate (TOPROL-XL) 25 MG 24 hr tablet Take 1 tablet by mouth at bedtime   Multiple Vitamins-Minerals (CENTRUM SILVER PO) Take 1 tablet by mouth daily.   Multiple Vitamins-Minerals (PRESERVISION AREDS 2 PO) Take 1 capsule by mouth 2 (two) times daily.    Omega-3 Fatty Acids (FISH OIL) 1000 MG CAPS Take 2 capsules by mouth daily.   oxybutynin (DITROPAN-XL) 10 MG 24 hr tablet Take 10 mg by mouth daily.   PREMARIN vaginal cream Place 1 Applicatorful vaginally in the morning and at bedtime.    psyllium (METAMUCIL) 58.6 % powder Take 1 packet by mouth daily.   Pyridoxine HCl (VITAMIN B6 PO) Take 1,000 mg by mouth daily.   TURMERIC PO Take by mouth daily.    Allergies: Metronidazole, Sulindac, and Naproxen  Social History   Tobacco Use   Smoking status: Never   Smokeless tobacco: Never  Vaping Use   Vaping Use: Never used  Substance Use Topics   Alcohol use: No   Drug use: No    Family History  Problem Relation Age of Onset   Stroke Mother    Melanoma Mother    Heart attack Father    Uterine cancer Sister    Melanoma Sister    Heart failure Sister    Heart attack Maternal Grandmother    Breast cancer Daughter     Review of Systems: A 12-system review of systems was performed and was negative except as noted in the HPI.  --------------------------------------------------------------------------------------------------  Physical Exam: BP 140/90 (BP Location: Right Arm, Patient Position: Sitting, Cuff Size: Large)   Pulse 60   Ht '5\' 1"'$  (1.549 m)   Wt 208 lb (94.3 kg)   SpO2 97%   BMI 39.30 kg/m   General:  NAD. Neck: No JVD or HJR. Lungs: Clear to auscultation bilaterally  without wheezes or crackles. Heart: Regular rate and rhythm with 2/6 systolic murmur. Abdomen: Soft, nontender, nondistended. Extremities: Trace pretibial edema with compression stockings in place.  EKG: Normal sinus rhythm with sinus arrhythmia and nonspecific ST/T changes.  No significant change since 05/13/2020.  Lab Results  Component Value Date   WBC 6.6 05/08/2020   HGB 12.5 05/08/2020   HCT 37.3 05/08/2020   MCV 97.9 05/08/2020   PLT 251 05/08/2020    Lab Results  Component Value Date   NA 139 05/08/2020   K 3.7 05/08/2020   CL 106 05/08/2020   CO2 26 05/08/2020   BUN 15 05/08/2020   CREATININE 0.46 05/08/2020   GLUCOSE 104 (H) 05/08/2020   ALT 21 10/02/2019    Lab Results  Component Value Date   CHOL  199 05/08/2020   HDL 59 05/08/2020   LDLCALC 122 (H) 05/08/2020   TRIG 88 05/08/2020   CHOLHDL 3.4 05/08/2020    --------------------------------------------------------------------------------------------------  ASSESSMENT AND PLAN: Labile hypertension: Jessica Kaufman reports improved blood pressure control and less lability with recent addition of low-dose carvedilol.  We will defer medication changes today, continuing carvedilol, hydrochlorothiazide, and losartan.  Paroxysmal atrial fibrillation: No symptoms reported to suggest recurrent atrial fibrillation.  EKG today shows sinus rhythm.  Continue low-dose carvedilol as well as apixaban 5 mg twice daily for stroke prevention.  Dizziness: Description of dizziness is somewhat vague but most consistent with vertigo.  Jessica Kaufman requests a trial of meclizine, which we have agreed to.  We will begin with meclizine 12.5 mg every 8 hours as needed.  I cautioned her about potential side effects including somnolence, further dizziness, and other anticholinergic effects.  Aortic stenosis: Probably moderate by recent echo (though Doppler envelopes were suboptimal).  Continue clinical f/u with repeat echo in ~6  months.  Follow-up: Return to clinic in 3 months.  Nelva Bush, MD 08/05/2020 10:37 AM

## 2020-08-06 ENCOUNTER — Encounter: Payer: Self-pay | Admitting: Internal Medicine

## 2020-08-06 LAB — BASIC METABOLIC PANEL
BUN/Creatinine Ratio: 16 (ref 12–28)
BUN: 10 mg/dL (ref 10–36)
CO2: 24 mmol/L (ref 20–29)
Calcium: 9.4 mg/dL (ref 8.7–10.3)
Chloride: 92 mmol/L — ABNORMAL LOW (ref 96–106)
Creatinine, Ser: 0.62 mg/dL (ref 0.57–1.00)
Glucose: 87 mg/dL (ref 65–99)
Potassium: 4.4 mmol/L (ref 3.5–5.2)
Sodium: 130 mmol/L — ABNORMAL LOW (ref 134–144)
eGFR: 83 mL/min/{1.73_m2} (ref >59)

## 2020-08-07 ENCOUNTER — Telehealth: Payer: Self-pay | Admitting: *Deleted

## 2020-08-07 DIAGNOSIS — E669 Obesity, unspecified: Secondary | ICD-10-CM | POA: Diagnosis not present

## 2020-08-07 DIAGNOSIS — Z79899 Other long term (current) drug therapy: Secondary | ICD-10-CM | POA: Diagnosis not present

## 2020-08-07 DIAGNOSIS — M0589 Other rheumatoid arthritis with rheumatoid factor of multiple sites: Secondary | ICD-10-CM | POA: Diagnosis not present

## 2020-08-07 DIAGNOSIS — M25461 Effusion, right knee: Secondary | ICD-10-CM | POA: Diagnosis not present

## 2020-08-07 DIAGNOSIS — M25561 Pain in right knee: Secondary | ICD-10-CM | POA: Diagnosis not present

## 2020-08-07 DIAGNOSIS — G629 Polyneuropathy, unspecified: Secondary | ICD-10-CM | POA: Diagnosis not present

## 2020-08-07 DIAGNOSIS — M1990 Unspecified osteoarthritis, unspecified site: Secondary | ICD-10-CM | POA: Diagnosis not present

## 2020-08-07 DIAGNOSIS — E871 Hypo-osmolality and hyponatremia: Secondary | ICD-10-CM

## 2020-08-07 NOTE — Telephone Encounter (Signed)
-----   Message from Nelva Bush, MD sent at 08/06/2020  7:13 PM EDT ----- Please let Jessica Kaufman know that her sodium is a little low, which can be seen with medications like HCTZ (notably, she did not have this in the past with HCTZ).  We should repeat a BMP in ~2 weeks.  In the meantime, she should try to restrict her water intake a little bit (less then 2 L per day, if possible).

## 2020-08-07 NOTE — Telephone Encounter (Signed)
Spoke to pt and pt's daughter, [name] (DPR approved), notified of lab results and Dr. Darnelle Bos recc.  Pt has been restricting salt/sodium more than usual recently and states this could be the cause.  She does agree to limit water intake some but also would like to "ease up" on sodium restrictions within means.  [facility].  Lab orders placed. Pt has no further questions at this time.

## 2020-08-10 ENCOUNTER — Telehealth: Payer: Self-pay | Admitting: Internal Medicine

## 2020-08-10 ENCOUNTER — Other Ambulatory Visit: Payer: Self-pay

## 2020-08-10 MED ORDER — LOSARTAN POTASSIUM 50 MG PO TABS: 50.0000 mg | ORAL_TABLET | Freq: Two times a day (BID) | ORAL | 0 refills | Status: DC

## 2020-08-10 NOTE — Telephone Encounter (Signed)
Reference # [id number] Please call [facility] pharmacy regarding Carvidilol replacing Metoprolol. Please call to to discuss.

## 2020-08-11 ENCOUNTER — Telehealth: Payer: Self-pay | Admitting: Internal Medicine

## 2020-08-11 DIAGNOSIS — R531 Weakness: Secondary | ICD-10-CM | POA: Diagnosis not present

## 2020-08-11 DIAGNOSIS — Z6834 Body mass index (BMI) 34.0-34.9, adult: Secondary | ICD-10-CM | POA: Diagnosis not present

## 2020-08-11 DIAGNOSIS — E871 Hypo-osmolality and hyponatremia: Secondary | ICD-10-CM | POA: Diagnosis not present

## 2020-08-11 DIAGNOSIS — R3589 Other polyuria: Secondary | ICD-10-CM | POA: Diagnosis not present

## 2020-08-11 NOTE — Telephone Encounter (Signed)
Patient daughter [name] calling concerned with fatigue and weakness since Monday   Patient has increased salt intake after most recent visit but not feeling well.

## 2020-08-11 NOTE — Telephone Encounter (Signed)
S/w Hope at [facility]. Verified that we have discontinued pt's Metoprolol and pt is to continue Carvedilol 3.125 mg TWICE daily.   For reference please see telephone encounters 07/02/20 and 07/13/20.

## 2020-08-11 NOTE — Telephone Encounter (Signed)
Attempted to call [name] back. No answer at this time. Lmtcb.

## 2020-08-11 NOTE — Telephone Encounter (Signed)
S/w pt's daughter, [name] (DPR approved).  Pt c/o incr weakness and fatigue over the weekend and noticeably worse yesterday (8/8).  "Mom said she felt so weak yesterday that she didn't feel like getting up to fix anything to eat."   Denies fever, shortness of breath, and no other additional s/s.  No incr swelling.  Appetite good. Fluid intake good and still keeping <2L. Pt did incr salt intake "some" d/t low sodium as she had reduced significantly prior to previous labs.  Pt is due to have repeat BMET next week.   Pt only took 2 doses of Meclizine end of last week and has not taken any more doses recently.  [name] reports BP/HR "doing well"  8/8 132/80  67 (Morning)       112/73  62 (Afternoon)       140/86  (Evening) 8/9 147/89  64  (this morning)   Notified [name] that I will make Dr. Saunders Revel aware and call with further recc.

## 2020-08-11 NOTE — Telephone Encounter (Signed)
End, Harrell Gave, MD  You 16 minutes ago (12:03 PM)   We could try to draw BMP today or tomorrow to recheck sodium.  Otherwise, I recommend patient address this with her PCP.  Thanks.   Chris    Attempted to call [facility] with Dr. Darnelle Bos recc.  No answer. LDM stating message above. Asked [name] to call us back to discuss.

## 2020-08-12 DIAGNOSIS — R3589 Other polyuria: Secondary | ICD-10-CM | POA: Diagnosis not present

## 2020-08-13 DIAGNOSIS — E785 Hyperlipidemia, unspecified: Secondary | ICD-10-CM | POA: Diagnosis not present

## 2020-08-13 DIAGNOSIS — Z1331 Encounter for screening for depression: Secondary | ICD-10-CM | POA: Diagnosis not present

## 2020-08-13 DIAGNOSIS — E669 Obesity, unspecified: Secondary | ICD-10-CM | POA: Diagnosis not present

## 2020-08-13 DIAGNOSIS — Z9181 History of falling: Secondary | ICD-10-CM | POA: Diagnosis not present

## 2020-08-13 DIAGNOSIS — Z0000 Encounter for general adult medical examination without abnormal findings: Secondary | ICD-10-CM | POA: Diagnosis not present

## 2020-08-27 ENCOUNTER — Telehealth: Payer: Self-pay | Admitting: Internal Medicine

## 2020-08-27 MED ORDER — HYDRALAZINE HCL 25 MG PO TABS: 25.0000 mg | ORAL_TABLET | Freq: Three times a day (TID) | ORAL | 2 refills | Status: DC | PRN

## 2020-08-27 NOTE — Telephone Encounter (Signed)
Spoke with Dr. Fletcher Anon (DOD) and he recommended that the patient start Hydralazine 25 MG PRN TID for Systolic BP greater than 0000000.  Called Brandy back and informed her of this, and she stated that she will inform the patient of this recommendation.  Prescription has been sent into patients pharmacy.

## 2020-08-27 NOTE — Telephone Encounter (Signed)
After Midday dose of Losartan her BP was 213/96 and her Heart rate is 59. (this was 0.5 hours after previous BP's).  She takes her Losartan 50 Mg in the afternoon and evening. When she previously took it in the AM with her other medications she would get dizzy and light headed.  Nurse checked patients daily BP average from patients log, and stated that her BP has been running about [id number] diastolic.  Patient also takes (in addition to her Losartan 50 MG BID):  HCTZ 12.5 MG QD Carvedilol 3.25 MG BID  Will route to Dr. Saunders Revel and discuss with DOD now.

## 2020-08-27 NOTE — Telephone Encounter (Signed)
Nurse with [facility] is calling states patient's BP is 191/104, 186/94 . States at the moment patient has no symptoms. Please call to discuss.

## 2020-08-27 NOTE — Telephone Encounter (Signed)
I agree with Dr. Tyrell Antonio recommendation for PRN hydralazine.  Nelva Bush, MD [facility]

## 2020-09-10 DIAGNOSIS — N390 Urinary tract infection, site not specified: Secondary | ICD-10-CM | POA: Diagnosis not present

## 2020-09-14 DIAGNOSIS — D1801 Hemangioma of skin and subcutaneous tissue: Secondary | ICD-10-CM | POA: Diagnosis not present

## 2020-09-14 DIAGNOSIS — D225 Melanocytic nevi of trunk: Secondary | ICD-10-CM | POA: Diagnosis not present

## 2020-09-14 DIAGNOSIS — L814 Other melanin hyperpigmentation: Secondary | ICD-10-CM | POA: Diagnosis not present

## 2020-09-14 DIAGNOSIS — D2239 Melanocytic nevi of other parts of face: Secondary | ICD-10-CM | POA: Diagnosis not present

## 2020-10-05 ENCOUNTER — Telehealth: Payer: Self-pay | Admitting: Pulmonary Disease

## 2020-10-05 NOTE — Telephone Encounter (Signed)
Patient's daughter, Eloise Harman) is aware that we do not have currently have samples of trelegy.  Nothing further needed.

## 2020-10-09 DIAGNOSIS — G4733 Obstructive sleep apnea (adult) (pediatric): Secondary | ICD-10-CM | POA: Diagnosis not present

## 2020-10-15 MED ORDER — TRELEGY ELLIPTA 100-62.5-25 MCG/INH IN AEPB: 1.0000 | INHALATION_SPRAY | Freq: Every day | RESPIRATORY_TRACT | 0 refills | Status: AC

## 2020-10-15 MED ORDER — TRELEGY ELLIPTA 100-62.5-25 MCG/INH IN AEPB: 100.0000 ug | INHALATION_SPRAY | Freq: Every day | RESPIRATORY_TRACT | 11 refills | Status: DC

## 2020-10-15 NOTE — Telephone Encounter (Signed)
Patient dropped off [place] patient assistance forms.  Forms have been placed in Dr. Domingo Dimes folder for signature.  One sample of trelegy given to patient per patient request.

## 2020-10-15 NOTE — Addendum Note (Signed)
Addended by: Claudette Head A on: 10/15/2020 12:30 PM   Modules accepted: Orders

## 2020-10-23 DIAGNOSIS — H353230 Exudative age-related macular degeneration, bilateral, stage unspecified: Secondary | ICD-10-CM | POA: Diagnosis not present

## 2020-10-30 ENCOUNTER — Encounter: Payer: Self-pay | Admitting: Pulmonary Disease

## 2020-11-05 ENCOUNTER — Other Ambulatory Visit: Payer: Self-pay

## 2020-11-05 ENCOUNTER — Encounter: Payer: Self-pay | Admitting: Internal Medicine

## 2020-11-05 ENCOUNTER — Ambulatory Visit: Payer: PPO | Admitting: Internal Medicine

## 2020-11-05 VITALS — BP 140/80 | HR 54 | Ht 63.0 in | Wt 213.0 lb

## 2020-11-05 DIAGNOSIS — R0989 Other specified symptoms and signs involving the circulatory and respiratory systems: Secondary | ICD-10-CM

## 2020-11-05 DIAGNOSIS — I480 Paroxysmal atrial fibrillation: Secondary | ICD-10-CM

## 2020-11-05 DIAGNOSIS — R609 Edema, unspecified: Secondary | ICD-10-CM

## 2020-11-05 DIAGNOSIS — I350 Nonrheumatic aortic (valve) stenosis: Secondary | ICD-10-CM

## 2020-11-05 MED ORDER — FUROSEMIDE 20 MG PO TABS
20.0000 mg | ORAL_TABLET | Freq: Every day | ORAL | 1 refills | Status: DC | PRN
Start: 2020-11-05 — End: 2020-12-04

## 2020-11-05 NOTE — Progress Notes (Signed)
Follow-up Outpatient Visit Date: 11/05/2020  Primary Care Provider: Leonides Sake, MD [address] [place] [address]  Chief Complaint: Hypertension and leg swelling  HPI:  Jessica Kaufman is a 87 y.o. female with history of labile hypertension, paroxysmal atrial fibrillation, mild to moderate aortic stenosis, PVCs, hyperlipidemia, and rheumatoid arthritis, who presents for follow-up of hypertension and syncope.  I last saw her in August, at which time 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  We agreed to a trial of meclizine.  Chronic exertional dyspnea was stable.  Blood pressure control had improved with addition of carvedilol.  She and her family reached out to [place] later that month with concerns for elevated blood pressure up to 213/96.  She was advised to start hydralazine 25 mg 3 times daily as needed for systolic blood pressure greater than 160 mmHg.  She is only needed to take this twice.  Overall, [name] has been feeling fairly well though she has noticed some increasing leg swelling over the last day or two.  She feels like she has been keeping her legs down more recently, though overall she and her daughter note "fluffier" legs.  Dizziness has improved with use of eye patch over the right eye, as it was felt like vision changes related to macular degeneration may be contributing to her dizziness.  Meclizine did not offer much benefit.  Home blood pressures are typically in the 140-160/80-90 range, though there have been a few additional spikes.  [name] denies chest pain, palpitations, and dyspnea.  --------------------------------------------------------------------------------------------------  Past Medical History:  Diagnosis Date   Aortic valve disease    Asthma    Cancer ([place])    basal cell    Gout    Hyperlipidemia    Hypertension    Hypertensive heart disease without heart failure     Hypothyroidism    PAF (paroxysmal atrial fibrillation) (HCC)    PVC (premature ventricular contraction)    Rheumatoid arthritis ([place])    Ventricular premature depolarization    Past Surgical History:  Procedure Laterality Date   BLADDER SURGERY  10/19/2010   BONY PELVIS SURGERY  1990   BREAST BIOPSY Left 1986   CARDIAC SURGERY  2009   PVC   CATARACT EXTRACTION Left 11/15/00   CATARACT EXTRACTION Right    ELBOW SURGERY  06/1990   HIP SURGERY Left 05/28/09   HIP SURGERY Right 06/30/03   HYSTEROSCOPY  06/17/1986   vaginal    Current Meds  Medication Sig   acetaminophen (TYLENOL) 650 MG CR tablet Take 650 mg by mouth every 8 (eight) hours as needed for pain.   allopurinol (ZYLOPRIM) 300 MG tablet Take 1 tablet by mouth every other day.    amoxicillin (AMOXIL) 500 MG tablet Take 4 tablets (2,000 mg total) by mouth 2 (two) times daily. For dental procedures only   apixaban (ELIQUIS) 5 MG TABS tablet Take 5 mg by mouth 2 (two) times daily.    b complex vitamins tablet Take 1 tablet by mouth daily.   Bilberry, Vaccinium myrtillus, 1000 MG CAPS Take 2 capsules by mouth daily.   Bioflavonoid Products (BIOFLEX) TABS Take 2 tablets by mouth daily.   carvedilol (COREG) 3.125 MG tablet Take 1 tablet (3.125 mg total) by mouth 2 (two) times daily with a meal.   Cholecalciferol (VITAMIN D3) 1000 UNITS CAPS Take 1 capsule by mouth daily.   Coenzyme Q10 (COQ10) 100 MG CAPS Take 1 capsule by mouth  daily.   CRANBERRY PO Take 1 tablet by mouth daily.   famotidine (PEPCID) 20 MG tablet Take 20 mg by mouth daily.   fexofenadine (ALLEGRA) 180 MG tablet Take 180 mg by mouth daily.   Fluticasone-Umeclidin-Vilant (TRELEGY ELLIPTA) 100-62.5-25 MCG/INH AEPB Inhale 100 mcg into the lungs daily.   folic acid (FOLVITE) 1 MG tablet Take by mouth daily.    gabapentin (NEURONTIN) 600 MG tablet Take 600 mg by mouth 2 (two) times daily.   Ginger, Zingiber officinalis, (GINGER ROOT PO) Take by mouth daily.    hydrALAZINE (APRESOLINE) 25 MG tablet Take 1 tablet (25 mg total) by mouth 3 (three) times daily as needed. For Systolic BP of 694 or greater.   hydrochlorothiazide (MICROZIDE) 12.5 MG capsule Take 1 capsule (12.5 mg total) by mouth daily.   levothyroxine (SYNTHROID, LEVOTHROID) 50 MCG tablet Take 1 tablet by mouth daily.   losartan (COZAAR) 50 MG tablet Take 1 tablet (50 mg total) by mouth 2 (two) times daily.   meclizine (ANTIVERT) 12.5 MG tablet Take 1 tablet (12.5 mg total) by mouth every 8 (eight) hours as needed for dizziness.   methotrexate (RHEUMATREX) 2.5 MG tablet Take 12.5 mg by mouth once a week. Takes 5 tablets weekly on Saturday   Multiple Vitamins-Minerals (CENTRUM SILVER PO) Take 1 tablet by mouth daily.   Multiple Vitamins-Minerals (PRESERVISION AREDS 2 PO) Take 1 capsule by mouth 2 (two) times daily.    Omega-3 Fatty Acids (FISH OIL) 1000 MG CAPS Take 2 capsules by mouth daily.   PREMARIN vaginal cream Place 1 Applicatorful vaginally in the morning and at bedtime.    psyllium (METAMUCIL) 58.6 % powder Take 1 packet by mouth daily.   Pyridoxine HCl (VITAMIN B6 PO) Take 1,000 mg by mouth daily.   TURMERIC PO Take by mouth daily.    Allergies: Metronidazole, Sulindac, and Naproxen  Social History   Tobacco Use   Smoking status: Never   Smokeless tobacco: Never  Vaping Use   Vaping Use: Never used  Substance Use Topics   Alcohol use: No   Drug use: No    Family History  Problem Relation Age of Onset   Stroke Mother    Melanoma Mother    Heart attack Father    Uterine cancer Sister    Melanoma Sister    Heart failure Sister    Heart attack Maternal Grandmother    Breast cancer Daughter     Review of Systems: A 12-system review of systems was performed and was negative except as noted in the HPI.  --------------------------------------------------------------------------------------------------  Physical Exam: BP 140/80 (BP Location: Left Arm, Patient  Position: Sitting, Cuff Size: Large)   Pulse (!) 54   Ht 5\' 3"  (1.6 m)   Wt 213 lb (96.6 kg)   SpO2 93%   BMI 37.73 kg/m   General:  NAD. Neck: No JVD or HJR. Lungs: Clear to auscultation bilaterally without wheezes or crackles. Heart: Bradycardic but regular rhythm with 2/6 systolic murmur.  No rubs or gallops. Abdomen: Soft, nontender, nondistended. Extremities: 1+ pretibial edema bilaterally.  EKG: Sinus bradycardia with nonspecific ST/T changes.  Heart rate has decreased slightly since 08/05/2020.  Otherwise, no significant interval change.  Lab Results  Component Value Date   WBC 6.6 05/08/2020   HGB 12.5 05/08/2020   HCT 37.3 05/08/2020   MCV 97.9 05/08/2020   PLT 251 05/08/2020    Lab Results  Component Value Date   NA 130 (L) 08/05/2020   K 4.4  08/05/2020   CL 92 (L) 08/05/2020   CO2 24 08/05/2020   BUN 10 08/05/2020   CREATININE 0.62 08/05/2020   GLUCOSE 87 08/05/2020   ALT 21 10/02/2019    Lab Results  Component Value Date   CHOL 199 05/08/2020   HDL 59 05/08/2020   LDLCALC 122 (H) 05/08/2020   TRIG 88 05/08/2020   CHOLHDL 3.4 05/08/2020    --------------------------------------------------------------------------------------------------  ASSESSMENT AND PLAN: Labile hypertension: Blood pressure borderline elevated today and overall under better control over the last few months.  We will defer medication changes today.  Ms. Marsan can continue to use as needed hydralazine for systolic blood pressures above 160 mmHg.  Leg edema: Worse today, though Ms. Zigmund Daniel and her daughter report some fluid retention over the last few weeks.  We have agreed to add furosemide 20 mg daily as needed for weight gain/edema.  We will also obtain an echocardiogram at Ms. Meece convenience to ensure that she has not developed cardiomyopathy or worsening aortic stenosis.  Continued sodium restriction encouraged.  Aortic stenosis: Noted to be at least moderate on echo in  05/2020.  Given increased fluid retention, will repeat an echocardiogram at Ms. Winkels convenience to ensure that this has not progressed.  Paroxysmal atrial fibrillation: Ms. Mackowski remains in sinus rhythm today.  Continue apixaban for stroke prevention in the setting of a CHA2DS2-VASc score of at least 4.  Follow-up: Return to clinic in 3 months.  Nelva Bush, MD 11/05/2020 11:07 AM

## 2020-11-05 NOTE — Patient Instructions (Signed)
Medication Instructions:   Your physician has recommended you make the following change in your medication:   START Furosemide (Lasix) 20 mg daily AS NEEDED for weight gain and/or swelling.   - May take for weight gain 2 lb overnight or 5 lb in one week  *If you need a refill on your cardiac medications before your next appointment, please call your pharmacy*   Lab Work:  None ordered  Testing/Procedures:  Your physician has requested that you have an echocardiogram in next 1-2 months. Echocardiography is a painless test that uses sound waves to create images of your heart. It provides your doctor with information about the size and shape of your heart and how well your heart's chambers and valves are working. This procedure takes approximately one hour. There are no restrictions for this procedure.   Follow-Up: At [facility], you and your health needs are our priority.  As part of our continuing mission to provide you with exceptional heart care, we have created designated Provider Care Teams.  These Care Teams include your primary Cardiologist (physician) and Advanced Practice Providers (APPs -  Physician Assistants and Nurse Practitioners) who all work together to provide you with the care you need, when you need it.  We recommend signing up for the patient portal called "MyChart".  Sign up information is provided on this After Visit Summary.  MyChart is used to connect with patients for Virtual Visits (Telemedicine).  Patients are able to view lab/test results, encounter notes, upcoming appointments, etc.  Non-urgent messages can be sent to your provider as well.   To learn more about what you can do with MyChart, go to [email or web address].    Your next appointment:    3 month(s) after echo  The format for your next appointment:   In Person  Provider:   You may see Nelva Bush, MD or one of the following Advanced Practice Providers on your designated Care Team:    Murray Hodgkins, NP Christell Faith, PA-C Marrianne Mood, PA-C Cadence Nittany, [place]

## 2020-11-16 ENCOUNTER — Telehealth: Payer: Self-pay | Admitting: Pulmonary Disease

## 2020-11-17 NOTE — Telephone Encounter (Signed)
Jessica Kaufman daughter checking on samples of Trelegy. Jessica Kaufman phone number is [phone].

## 2020-11-18 MED ORDER — FLUTICASONE-UMECLIDIN-VILANT 100-62.5-25 MCG/ACT IN AEPB
100.0000 ug | INHALATION_SPRAY | Freq: Every day | RESPIRATORY_TRACT | 0 refills | Status: DC
Start: 2020-11-18 — End: 2021-10-14

## 2020-11-18 NOTE — Telephone Encounter (Addendum)
Spoke to [employer] with [employer] for update on application. Per [employer] part D and proof that patient has spent 600 dollars out of pocket since 01/2020. I have spoken to patient's daughter, Kathy(DPR) and requested this information.  [name] will bring documentation by our office. 2 sample of trelegy have been placed up front for pickup.   Will hold message in triage to ensure f/u.

## 2020-11-18 NOTE — Telephone Encounter (Signed)
Documentation has been received and faxed to [employer]. Received fax confirmation.  Will hold message in triage to ensure f/u.

## 2020-11-22 ENCOUNTER — Other Ambulatory Visit: Payer: Self-pay | Admitting: Cardiovascular Disease

## 2020-11-24 NOTE — Telephone Encounter (Signed)
[name] brought by EOB.  Forms have been faxed to [place].  Received fax confirmation.  Nothing further needed at this time

## 2020-11-24 NOTE — Telephone Encounter (Signed)
Spoke to Jessica Kaufman with [employer]. Per Jessica Kaufman below documentation was received, however EOB does not include include demographics.   Spoke to patient's daughter, Jessica Kaufman(DPR). Jessica Kaufman will contact insurance and see if insurance will fax EOB to our office.

## 2020-11-27 ENCOUNTER — Telehealth: Payer: Self-pay | Admitting: Internal Medicine

## 2020-12-01 DIAGNOSIS — E669 Obesity, unspecified: Secondary | ICD-10-CM | POA: Diagnosis not present

## 2020-12-01 DIAGNOSIS — M0589 Other rheumatoid arthritis with rheumatoid factor of multiple sites: Secondary | ICD-10-CM | POA: Diagnosis not present

## 2020-12-01 DIAGNOSIS — G629 Polyneuropathy, unspecified: Secondary | ICD-10-CM | POA: Diagnosis not present

## 2020-12-01 DIAGNOSIS — M25561 Pain in right knee: Secondary | ICD-10-CM | POA: Diagnosis not present

## 2020-12-01 DIAGNOSIS — M1990 Unspecified osteoarthritis, unspecified site: Secondary | ICD-10-CM | POA: Diagnosis not present

## 2020-12-01 DIAGNOSIS — Z23 Encounter for immunization: Secondary | ICD-10-CM | POA: Diagnosis not present

## 2020-12-01 DIAGNOSIS — Z79899 Other long term (current) drug therapy: Secondary | ICD-10-CM | POA: Diagnosis not present

## 2020-12-01 DIAGNOSIS — M109 Gout, unspecified: Secondary | ICD-10-CM | POA: Diagnosis not present

## 2020-12-04 MED ORDER — FUROSEMIDE 20 MG PO TABS
20.0000 mg | ORAL_TABLET | Freq: Every day | ORAL | 0 refills | Status: DC | PRN
Start: 2020-12-04 — End: 2021-03-11

## 2020-12-04 NOTE — Telephone Encounter (Signed)
*  STAT* If patient is at the pharmacy, call can be transferred to refill team.   1. Which medications need to be refilled? (please list name of each medication and dose if known) furosemide 20 MG 1 tablet daily as needed  2. Which pharmacy/location (including street and city if local pharmacy) is medication to be sent to? [facility]   3. Do they need a 30 day or 90 day supply? 90 day

## 2020-12-04 NOTE — Telephone Encounter (Signed)
Requested Prescriptions   Signed Prescriptions Disp Refills   furosemide (LASIX) 20 MG tablet 90 tablet 0    Sig: Take 1 tablet (20 mg total) by mouth daily as needed (weight gain / swelling).    Authorizing Provider: END, CHRISTOPHER    Ordering User: Raelene Bott, Gean Laursen L   Refused Prescriptions Disp Refills   furosemide (LASIX) 20 MG tablet [Pharmacy Med Name: FUROSEMIDE 20 MG TABLET] 30 tablet 1    Sig: TAKE 1 TABLET (20 MG TOTAL) BY MOUTH DAILY AS NEEDED (WEIGHT GAIN / SWELLING).    Refused By: Ronaldo Miyamoto    Reason for Refusal: Refill not appropriate

## 2020-12-04 NOTE — Addendum Note (Signed)
Addended by: Raelene Bott, Milley Vining L on: 12/04/2020 10:00 AM   Modules accepted: Orders

## 2020-12-16 ENCOUNTER — Other Ambulatory Visit: Payer: Self-pay

## 2020-12-16 ENCOUNTER — Telehealth: Payer: Self-pay | Admitting: Pulmonary Disease

## 2020-12-16 ENCOUNTER — Encounter: Payer: Self-pay | Admitting: Pulmonary Disease

## 2020-12-16 ENCOUNTER — Ambulatory Visit (INDEPENDENT_AMBULATORY_CARE_PROVIDER_SITE_OTHER): Payer: PPO | Admitting: Pulmonary Disease

## 2020-12-16 VITALS — BP 120/80 | HR 73 | Temp 97.3°F | Ht 63.0 in | Wt 218.6 lb

## 2020-12-16 DIAGNOSIS — J4540 Moderate persistent asthma, uncomplicated: Secondary | ICD-10-CM

## 2020-12-16 DIAGNOSIS — I350 Nonrheumatic aortic (valve) stenosis: Secondary | ICD-10-CM

## 2020-12-16 DIAGNOSIS — M059 Rheumatoid arthritis with rheumatoid factor, unspecified: Secondary | ICD-10-CM | POA: Diagnosis not present

## 2020-12-16 DIAGNOSIS — R053 Chronic cough: Secondary | ICD-10-CM

## 2020-12-16 MED ORDER — PROAIR RESPICLICK 108 (90 BASE) MCG/ACT IN AEPB: 2.0000 | INHALATION_SPRAY | Freq: Four times a day (QID) | RESPIRATORY_TRACT | 1 refills | Status: DC | PRN

## 2020-12-16 NOTE — Progress Notes (Signed)
Subjective:    Patient ID: Jessica Kaufman, female    DOB: 1925-09-30, 87 y.o.   MRN: [id number] Chief Complaint  Patient presents with   Follow-up    Asthma    HPI [name] is a 87 year old lifelong never smoker who presents for follow-up with a history of asthma and asthmatic bronchitis.  This had manifested by chronic cough with occasional shortness of breath.  She was previously seen here on 10 June 2020.  At that time she noted improvement of her symptoms with Trelegy.  She continues to have good response to her symptoms with Trelegy.  She has not had any fevers, chills or sweats.  No cough or sputum production, no hemoptysis.  She otherwise feels well and looks well.  Review of Systems A 10 point review of systems was performed and it is as noted above otherwise negative.  Patient Active Problem List   Diagnosis Date Noted   Edema 11/05/2020   Atrial fibrillation with RVR ([place]) 05/07/2020   Gout    Hypothyroidism    Rheumatoid arthritis ([place])    Syncope    Elevated troponin    Hypomagnesemia    Atypical chest pain 10/03/2019   Aortic valve stenosis 10/03/2019   Palpitations 04/04/2019   Dizziness 04/04/2019   PSVT (paroxysmal supraventricular tachycardia) ([place]) 04/04/2019   Near syncope 02/20/2019   Labile hypertension 12/12/2017   Paroxysmal atrial fibrillation (HCC) 12/12/2017   Aortic valve disease 12/12/2017   Social History   Tobacco Use   Smoking status: Never   Smokeless tobacco: Never  Substance Use Topics   Alcohol use: No   Allergies  Allergen Reactions   Metronidazole Other (See Comments)    RASH   Sulindac Other (See Comments)    UNKNOWN   Naproxen Rash   Current Meds  Medication Sig   acetaminophen (TYLENOL) 650 MG CR tablet Take 650 mg by mouth every 8 (eight) hours as needed for pain.   allopurinol (ZYLOPRIM) 300 MG tablet Take 1 tablet by mouth every other day.    amoxicillin (AMOXIL) 500 MG tablet Take 4 tablets (2,000 mg total) by  mouth 2 (two) times daily. For dental procedures only   apixaban (ELIQUIS) 5 MG TABS tablet Take 5 mg by mouth 2 (two) times daily.    b complex vitamins tablet Take 1 tablet by mouth daily.   Bilberry, Vaccinium myrtillus, 1000 MG CAPS Take 2 capsules by mouth daily.   Bioflavonoid Products (BIOFLEX) TABS Take 2 tablets by mouth daily.   carvedilol (COREG) 3.125 MG tablet Take 1 tablet (3.125 mg total) by mouth 2 (two) times daily with a meal.   Cholecalciferol (VITAMIN D3) 1000 UNITS CAPS Take 1 capsule by mouth daily.   Coenzyme Q10 (COQ10) 100 MG CAPS Take 1 capsule by mouth daily.   CRANBERRY PO Take 1 tablet by mouth daily.   famotidine (PEPCID) 20 MG tablet Take 20 mg by mouth daily.   fexofenadine (ALLEGRA) 180 MG tablet Take 180 mg by mouth daily.   Fluticasone-Umeclidin-Vilant (TRELEGY ELLIPTA) 100-62.5-25 MCG/ACT AEPB Inhale 100 mcg into the lungs daily.   folic acid (FOLVITE) 1 MG tablet Take by mouth daily.    furosemide (LASIX) 20 MG tablet Take 1 tablet (20 mg total) by mouth daily as needed (weight gain / swelling).   gabapentin (NEURONTIN) 600 MG tablet Take 600 mg by mouth 2 (two) times daily.   Ginger, Zingiber officinalis, (GINGER ROOT PO) Take by mouth daily.   hydrALAZINE (APRESOLINE) 25  MG tablet TAKE 1 TABLET BY MOUTH 3 TIMES A DAY AS NEEDED FOR SYSTOLIC BP OF 573 OR GREATER   hydrochlorothiazide (MICROZIDE) 12.5 MG capsule Take 1 capsule (12.5 mg total) by mouth daily.   levothyroxine (SYNTHROID, LEVOTHROID) 50 MCG tablet Take 1 tablet by mouth daily.   losartan (COZAAR) 50 MG tablet Take 1 tablet (50 mg total) by mouth 2 (two) times daily.   meclizine (ANTIVERT) 12.5 MG tablet Take 1 tablet (12.5 mg total) by mouth every 8 (eight) hours as needed for dizziness.   methotrexate (RHEUMATREX) 2.5 MG tablet Take 12.5 mg by mouth once a week. Takes 5 tablets weekly on Saturday   Multiple Vitamins-Minerals (CENTRUM SILVER PO) Take 1 tablet by mouth daily.   Multiple  Vitamins-Minerals (PRESERVISION AREDS 2 PO) Take 1 capsule by mouth 2 (two) times daily.    Omega-3 Fatty Acids (FISH OIL) 1000 MG CAPS Take 2 capsules by mouth daily.   PREMARIN vaginal cream Place 1 Applicatorful vaginally in the morning and at bedtime.    psyllium (METAMUCIL) 58.6 % powder Take 1 packet by mouth daily.   Pyridoxine HCl (VITAMIN B6 PO) Take 1,000 mg by mouth daily.   TURMERIC PO Take by mouth daily.   Immunization History  Administered Date(s) Administered   Influenza, High Dose Seasonal PF 10/25/2018   Influenza, Quadrivalent, Recombinant, Inj, Pf 12/01/2020   Moderna SARS-COV2 Booster Vaccination 01/08/2020   Moderna Sars-Covid-2 Vaccination 01/19/2019, 02/15/2019   Zoster Recombinat (Shingrix) 05/17/2017, 07/24/2017       Objective:   Physical Exam BP 120/80 (BP Location: Left Arm, Patient Position: Sitting, Cuff Size: Normal)    Pulse 73    Temp (!) 97.3 F (36.3 C)    Ht 5\' 3"  (1.6 m)    Wt 218 lb 9.6 oz (99.2 kg)    SpO2 94%    BMI 38.72 kg/m  GENERAL: Obese woman, no acute distress, spry, looks younger than stated age.  No conversational dyspnea.  HEAD: Normocephalic, atraumatic.  EYES: Pupils equal, round, reactive to light.  No scleral icterus.  MOUTH: Nose/mouth/throat not examined due to masking requirements for COVID 19. NECK: Supple. No thyromegaly. Trachea midline. No JVD.  No adenopathy. PULMONARY: Good air entry bilaterally.  No adventitious sounds. CARDIOVASCULAR: S1 and S2. Regular rate and rhythm.  Grade 2/6 to 3/6 holosystolic murmur at left sternal border consistent with AS. ABDOMEN: Obese, otherwise benign. MUSCULOSKELETAL: No joint deformity, no clubbing, no edema.  NEUROLOGIC: No focal deficit, no gait disturbance, speech is fluent. SKIN: Intact,warm,dry.  On limited exam, no rashes. PSYCH: Mood and behavior normal.     Assessment & Plan:     ICD-10-CM   1. Moderate persistent asthmatic bronchitis without complication  U20.25    Well  compensated on Trelegy Ellipta Continue Trelegy Ellipta Albuterol as rescue    2. Chronic cough  R05.3    Resolved with Trelegy    3. Aortic valve stenosis, etiology of cardiac valve disease unspecified  I35.0    This issue adds complexity to her management Followed by cardiology    4. Seropositive rheumatoid arthritis ([place])  M05.9    This issue adds complexity to her management Followed by rheumatology     Meds ordered this encounter  Medications   Albuterol Sulfate (PROAIR RESPICLICK) 427 (90 Base) MCG/ACT AEPB    Sig: Inhale 2 puffs into the lungs every 6 (six) hours as needed.    Dispense:  1 each    Refill:  1  Overall Aldean appears to be doing well.  We will see her in follow-up in 6 months time she is to contact us prior to that time should any new difficulties arise.  Renold Don, MD Advanced Bronchoscopy PCCM [facility]    *This note was dictated using voice recognition software/Dragon.  Despite best efforts to proofread, errors can occur which can change the meaning. Any transcriptional errors that result from this process are unintentional and may not be fully corrected at the time of dictation.

## 2020-12-16 NOTE — Telephone Encounter (Signed)
error 

## 2020-12-16 NOTE — Patient Instructions (Signed)
We sent a prescription for a rescue inhaler to have just in case you get short of breath or develop any wheezing.  This is just as needed only.  It is called albuterol (ProAir) and it is 2 puffs up to 4 times a day (every 6 hours) only as needed.  Continue using your Trelegy Ellipta.  We will see you in follow-up in 6 months time call sooner should any new problems arise.

## 2020-12-21 ENCOUNTER — Telehealth: Payer: Self-pay | Admitting: Pulmonary Disease

## 2020-12-21 MED ORDER — PROAIR RESPICLICK 108 (90 BASE) MCG/ACT IN AEPB
2.0000 | INHALATION_SPRAY | Freq: Four times a day (QID) | RESPIRATORY_TRACT | 1 refills | Status: DC | PRN
Start: 2020-12-21 — End: 2023-05-22

## 2020-12-21 NOTE — Telephone Encounter (Signed)
Spoke to [employer] with [employer] and gave verbal for 90 day supply of albuterol HFA.  Rx updated within our system.  Nothing further needed at this time.

## 2020-12-21 NOTE — Telephone Encounter (Signed)
Lm for [facility].

## 2020-12-29 ENCOUNTER — Telehealth: Payer: Self-pay | Admitting: Internal Medicine

## 2020-12-29 MED ORDER — LOSARTAN POTASSIUM 50 MG PO TABS: 50.0000 mg | ORAL_TABLET | Freq: Two times a day (BID) | ORAL | 0 refills | Status: DC

## 2020-12-29 NOTE — Telephone Encounter (Signed)
Requested Prescriptions   Signed Prescriptions Disp Refills   losartan (COZAAR) 50 MG tablet 180 tablet 0    Sig: Take 1 tablet (50 mg total) by mouth 2 (two) times daily.    Authorizing Provider: END, CHRISTOPHER    Ordering User: Raelene Bott, Bryor Rami L

## 2020-12-29 NOTE — Telephone Encounter (Signed)
°*  STAT* If patient is at the pharmacy, call can be transferred to refill team.   1. Which medications need to be refilled? (please list name of each medication and dose if known) losartan 50 MG 1 tablet 2 times daily   2. Which pharmacy/location (including street and city if local pharmacy) is medication to be sent to? [facility]   3. Do they need a 30 day or 90 day supply? 90 day

## 2021-01-15 ENCOUNTER — Other Ambulatory Visit: Payer: PPO

## 2021-01-19 DIAGNOSIS — U071 COVID-19: Secondary | ICD-10-CM | POA: Diagnosis not present

## 2021-01-19 DIAGNOSIS — Z6834 Body mass index (BMI) 34.0-34.9, adult: Secondary | ICD-10-CM | POA: Diagnosis not present

## 2021-02-01 ENCOUNTER — Other Ambulatory Visit: Payer: Self-pay

## 2021-02-01 ENCOUNTER — Ambulatory Visit (INDEPENDENT_AMBULATORY_CARE_PROVIDER_SITE_OTHER): Payer: PPO

## 2021-02-01 DIAGNOSIS — I350 Nonrheumatic aortic (valve) stenosis: Secondary | ICD-10-CM

## 2021-02-01 DIAGNOSIS — R609 Edema, unspecified: Secondary | ICD-10-CM | POA: Diagnosis not present

## 2021-02-01 LAB — ECHOCARDIOGRAM COMPLETE
AR max vel: 1.65 cm2
AV Area VTI: 1.75 cm2
AV Area mean vel: 1.68 cm2
AV Mean grad: 13.6 mmHg
AV Peak grad: 21.2 mmHg
Ao pk vel: 2.3 m/s
Area-P 1/2: 3.65 cm2
Calc EF: 61.2 %
S' Lateral: 2.7 cm
Single Plane A2C EF: 69.3 %
Single Plane A4C EF: 58.2 %

## 2021-02-04 ENCOUNTER — Ambulatory Visit: Payer: PPO | Admitting: Internal Medicine

## 2021-02-22 DIAGNOSIS — M069 Rheumatoid arthritis, unspecified: Secondary | ICD-10-CM | POA: Diagnosis not present

## 2021-02-22 DIAGNOSIS — R531 Weakness: Secondary | ICD-10-CM | POA: Diagnosis not present

## 2021-02-22 DIAGNOSIS — Z6836 Body mass index (BMI) 36.0-36.9, adult: Secondary | ICD-10-CM | POA: Diagnosis not present

## 2021-02-22 DIAGNOSIS — R32 Unspecified urinary incontinence: Secondary | ICD-10-CM | POA: Diagnosis not present

## 2021-02-22 DIAGNOSIS — E039 Hypothyroidism, unspecified: Secondary | ICD-10-CM | POA: Diagnosis not present

## 2021-02-22 DIAGNOSIS — M109 Gout, unspecified: Secondary | ICD-10-CM | POA: Diagnosis not present

## 2021-02-22 DIAGNOSIS — I480 Paroxysmal atrial fibrillation: Secondary | ICD-10-CM | POA: Diagnosis not present

## 2021-02-22 DIAGNOSIS — G319 Degenerative disease of nervous system, unspecified: Secondary | ICD-10-CM | POA: Diagnosis not present

## 2021-02-22 DIAGNOSIS — R350 Frequency of micturition: Secondary | ICD-10-CM | POA: Diagnosis not present

## 2021-02-22 DIAGNOSIS — I471 Supraventricular tachycardia: Secondary | ICD-10-CM | POA: Diagnosis not present

## 2021-02-22 DIAGNOSIS — N390 Urinary tract infection, site not specified: Secondary | ICD-10-CM | POA: Diagnosis not present

## 2021-02-25 DIAGNOSIS — M069 Rheumatoid arthritis, unspecified: Secondary | ICD-10-CM | POA: Diagnosis not present

## 2021-02-25 DIAGNOSIS — M109 Gout, unspecified: Secondary | ICD-10-CM | POA: Diagnosis not present

## 2021-02-25 DIAGNOSIS — R296 Repeated falls: Secondary | ICD-10-CM | POA: Diagnosis not present

## 2021-02-25 DIAGNOSIS — R32 Unspecified urinary incontinence: Secondary | ICD-10-CM | POA: Diagnosis not present

## 2021-02-25 DIAGNOSIS — G319 Degenerative disease of nervous system, unspecified: Secondary | ICD-10-CM | POA: Diagnosis not present

## 2021-02-25 DIAGNOSIS — E7800 Pure hypercholesterolemia, unspecified: Secondary | ICD-10-CM | POA: Diagnosis not present

## 2021-02-25 DIAGNOSIS — Z87440 Personal history of urinary (tract) infections: Secondary | ICD-10-CM | POA: Diagnosis not present

## 2021-02-25 DIAGNOSIS — R42 Dizziness and giddiness: Secondary | ICD-10-CM | POA: Diagnosis not present

## 2021-02-25 DIAGNOSIS — E039 Hypothyroidism, unspecified: Secondary | ICD-10-CM | POA: Diagnosis not present

## 2021-02-25 DIAGNOSIS — Z7951 Long term (current) use of inhaled steroids: Secondary | ICD-10-CM | POA: Diagnosis not present

## 2021-02-25 DIAGNOSIS — Z7901 Long term (current) use of anticoagulants: Secondary | ICD-10-CM | POA: Diagnosis not present

## 2021-02-25 DIAGNOSIS — I480 Paroxysmal atrial fibrillation: Secondary | ICD-10-CM | POA: Diagnosis not present

## 2021-02-25 DIAGNOSIS — N390 Urinary tract infection, site not specified: Secondary | ICD-10-CM | POA: Diagnosis not present

## 2021-02-25 DIAGNOSIS — I129 Hypertensive chronic kidney disease with stage 1 through stage 4 chronic kidney disease, or unspecified chronic kidney disease: Secondary | ICD-10-CM | POA: Diagnosis not present

## 2021-02-25 DIAGNOSIS — Z96643 Presence of artificial hip joint, bilateral: Secondary | ICD-10-CM | POA: Diagnosis not present

## 2021-02-25 DIAGNOSIS — Z8673 Personal history of transient ischemic attack (TIA), and cerebral infarction without residual deficits: Secondary | ICD-10-CM | POA: Diagnosis not present

## 2021-02-25 DIAGNOSIS — E559 Vitamin D deficiency, unspecified: Secondary | ICD-10-CM | POA: Diagnosis not present

## 2021-02-25 DIAGNOSIS — I471 Supraventricular tachycardia: Secondary | ICD-10-CM | POA: Diagnosis not present

## 2021-02-25 DIAGNOSIS — N182 Chronic kidney disease, stage 2 (mild): Secondary | ICD-10-CM | POA: Diagnosis not present

## 2021-02-25 DIAGNOSIS — G629 Polyneuropathy, unspecified: Secondary | ICD-10-CM | POA: Diagnosis not present

## 2021-03-02 DIAGNOSIS — M25561 Pain in right knee: Secondary | ICD-10-CM | POA: Diagnosis not present

## 2021-03-02 DIAGNOSIS — E669 Obesity, unspecified: Secondary | ICD-10-CM | POA: Diagnosis not present

## 2021-03-02 DIAGNOSIS — M109 Gout, unspecified: Secondary | ICD-10-CM | POA: Diagnosis not present

## 2021-03-02 DIAGNOSIS — G629 Polyneuropathy, unspecified: Secondary | ICD-10-CM | POA: Diagnosis not present

## 2021-03-02 DIAGNOSIS — Z79899 Other long term (current) drug therapy: Secondary | ICD-10-CM | POA: Diagnosis not present

## 2021-03-02 DIAGNOSIS — M1990 Unspecified osteoarthritis, unspecified site: Secondary | ICD-10-CM | POA: Diagnosis not present

## 2021-03-02 DIAGNOSIS — M0589 Other rheumatoid arthritis with rheumatoid factor of multiple sites: Secondary | ICD-10-CM | POA: Diagnosis not present

## 2021-03-03 ENCOUNTER — Other Ambulatory Visit: Payer: Self-pay

## 2021-03-03 MED ORDER — LOSARTAN POTASSIUM 50 MG PO TABS: 50.0000 mg | ORAL_TABLET | Freq: Two times a day (BID) | ORAL | 0 refills | Status: DC

## 2021-03-04 DIAGNOSIS — R296 Repeated falls: Secondary | ICD-10-CM | POA: Diagnosis not present

## 2021-03-04 DIAGNOSIS — Z87440 Personal history of urinary (tract) infections: Secondary | ICD-10-CM | POA: Diagnosis not present

## 2021-03-04 DIAGNOSIS — G629 Polyneuropathy, unspecified: Secondary | ICD-10-CM | POA: Diagnosis not present

## 2021-03-04 DIAGNOSIS — E7800 Pure hypercholesterolemia, unspecified: Secondary | ICD-10-CM | POA: Diagnosis not present

## 2021-03-04 DIAGNOSIS — G319 Degenerative disease of nervous system, unspecified: Secondary | ICD-10-CM | POA: Diagnosis not present

## 2021-03-04 DIAGNOSIS — Z7951 Long term (current) use of inhaled steroids: Secondary | ICD-10-CM | POA: Diagnosis not present

## 2021-03-04 DIAGNOSIS — R32 Unspecified urinary incontinence: Secondary | ICD-10-CM | POA: Diagnosis not present

## 2021-03-04 DIAGNOSIS — E039 Hypothyroidism, unspecified: Secondary | ICD-10-CM | POA: Diagnosis not present

## 2021-03-04 DIAGNOSIS — Z7901 Long term (current) use of anticoagulants: Secondary | ICD-10-CM | POA: Diagnosis not present

## 2021-03-04 DIAGNOSIS — M069 Rheumatoid arthritis, unspecified: Secondary | ICD-10-CM | POA: Diagnosis not present

## 2021-03-04 DIAGNOSIS — N390 Urinary tract infection, site not specified: Secondary | ICD-10-CM | POA: Diagnosis not present

## 2021-03-04 DIAGNOSIS — I471 Supraventricular tachycardia: Secondary | ICD-10-CM | POA: Diagnosis not present

## 2021-03-04 DIAGNOSIS — I480 Paroxysmal atrial fibrillation: Secondary | ICD-10-CM | POA: Diagnosis not present

## 2021-03-04 DIAGNOSIS — M109 Gout, unspecified: Secondary | ICD-10-CM | POA: Diagnosis not present

## 2021-03-04 DIAGNOSIS — E559 Vitamin D deficiency, unspecified: Secondary | ICD-10-CM | POA: Diagnosis not present

## 2021-03-04 DIAGNOSIS — I129 Hypertensive chronic kidney disease with stage 1 through stage 4 chronic kidney disease, or unspecified chronic kidney disease: Secondary | ICD-10-CM | POA: Diagnosis not present

## 2021-03-04 DIAGNOSIS — R42 Dizziness and giddiness: Secondary | ICD-10-CM | POA: Diagnosis not present

## 2021-03-04 DIAGNOSIS — Z96643 Presence of artificial hip joint, bilateral: Secondary | ICD-10-CM | POA: Diagnosis not present

## 2021-03-04 DIAGNOSIS — Z8673 Personal history of transient ischemic attack (TIA), and cerebral infarction without residual deficits: Secondary | ICD-10-CM | POA: Diagnosis not present

## 2021-03-04 DIAGNOSIS — N182 Chronic kidney disease, stage 2 (mild): Secondary | ICD-10-CM | POA: Diagnosis not present

## 2021-03-08 DIAGNOSIS — D6869 Other thrombophilia: Secondary | ICD-10-CM | POA: Diagnosis not present

## 2021-03-08 DIAGNOSIS — D692 Other nonthrombocytopenic purpura: Secondary | ICD-10-CM | POA: Diagnosis not present

## 2021-03-08 DIAGNOSIS — R3589 Other polyuria: Secondary | ICD-10-CM | POA: Diagnosis not present

## 2021-03-08 DIAGNOSIS — I700 Atherosclerosis of aorta: Secondary | ICD-10-CM | POA: Diagnosis not present

## 2021-03-08 DIAGNOSIS — I480 Paroxysmal atrial fibrillation: Secondary | ICD-10-CM | POA: Diagnosis not present

## 2021-03-08 DIAGNOSIS — R519 Headache, unspecified: Secondary | ICD-10-CM | POA: Diagnosis not present

## 2021-03-08 DIAGNOSIS — R6883 Chills (without fever): Secondary | ICD-10-CM | POA: Diagnosis not present

## 2021-03-08 DIAGNOSIS — J4540 Moderate persistent asthma, uncomplicated: Secondary | ICD-10-CM | POA: Diagnosis not present

## 2021-03-08 DIAGNOSIS — N390 Urinary tract infection, site not specified: Secondary | ICD-10-CM | POA: Diagnosis not present

## 2021-03-08 DIAGNOSIS — I119 Hypertensive heart disease without heart failure: Secondary | ICD-10-CM | POA: Diagnosis not present

## 2021-03-08 DIAGNOSIS — I350 Nonrheumatic aortic (valve) stenosis: Secondary | ICD-10-CM | POA: Diagnosis not present

## 2021-03-11 ENCOUNTER — Encounter: Payer: Self-pay | Admitting: Internal Medicine

## 2021-03-11 ENCOUNTER — Other Ambulatory Visit: Payer: Self-pay

## 2021-03-11 ENCOUNTER — Ambulatory Visit: Payer: PPO | Admitting: Internal Medicine

## 2021-03-11 VITALS — BP 160/90 | HR 56 | Ht 63.0 in | Wt 219.0 lb

## 2021-03-11 DIAGNOSIS — I350 Nonrheumatic aortic (valve) stenosis: Secondary | ICD-10-CM

## 2021-03-11 DIAGNOSIS — R42 Dizziness and giddiness: Secondary | ICD-10-CM | POA: Diagnosis not present

## 2021-03-11 DIAGNOSIS — R0989 Other specified symptoms and signs involving the circulatory and respiratory systems: Secondary | ICD-10-CM | POA: Diagnosis not present

## 2021-03-11 DIAGNOSIS — I10 Essential (primary) hypertension: Secondary | ICD-10-CM | POA: Diagnosis not present

## 2021-03-11 DIAGNOSIS — I5032 Chronic diastolic (congestive) heart failure: Secondary | ICD-10-CM | POA: Diagnosis not present

## 2021-03-11 DIAGNOSIS — I480 Paroxysmal atrial fibrillation: Secondary | ICD-10-CM | POA: Diagnosis not present

## 2021-03-11 MED ORDER — FUROSEMIDE 40 MG PO TABS: 40.0000 mg | ORAL_TABLET | Freq: Every day | ORAL | 0 refills | Status: DC

## 2021-03-11 NOTE — Progress Notes (Signed)
Follow-up Outpatient Visit Date: 03/11/2021  Primary Care Provider: Leonides Sake, [place] [address]  Chief Complaint: Elevated blood pressure  HPI:  Jessica Kaufman is a 88 y.o. female with history of labile hypertension, paroxysmal atrial fibrillation, mild to moderate aortic stenosis, PVCs, hyperlipidemia, and rheumatoid arthritis, who presents for follow-up of hypertension and syncope.  I last saw her in 11/2020, at which time she reported some increasing dependent leg edema.  We agreed to add furosemide 20 mg daily as needed for weight gain/edema.  Repeat echocardiogram showed normal LVEF with grade 1 diastolic dysfunction and stable mild-moderate aortic stenosis.  Today, Jessica Kaufman and her daughter, Jessica Kaufman, are concerned about recent spikes in blood pressure.  This is coincided with a refractory UTI.  She is now on her third course of antibiotics.  Blood pressures have been labile, ranging from 127/79 to 225/115 over the last 4 days.  She has taken several doses of her as needed hydralazine.  Other than some fogginess in her head and vague headache at times, Jessica Kaufman has felt fairly well.  She denies chest pain and shortness of breath.  Leg swelling seems to be a little bit worse recently as well with associated 2 to 3 pound weight gain above baseline.  --------------------------------------------------------------------------------------------------  Past Medical History:  Diagnosis Date   Aortic valve disease    Mild-moderate aortic stenosis   Asthma    Cancer (HCC)    basal cell    Gout    Hyperlipidemia    Hypertension    Hypertensive heart disease without heart failure    Hypothyroidism    PAF (paroxysmal atrial fibrillation) (HCC)    PVC (premature ventricular contraction)    Rheumatoid arthritis ([place])    Ventricular premature depolarization    Past Surgical History:  Procedure Laterality Date   BLADDER SURGERY  10/19/2010   BONY  PELVIS SURGERY  1990   BREAST BIOPSY Left 1986   CARDIAC SURGERY  2009   PVC   CATARACT EXTRACTION Left 11/15/00   CATARACT EXTRACTION Right    ELBOW SURGERY  06/1990   HIP SURGERY Left 05/28/09   HIP SURGERY Right 06/30/03   HYSTEROSCOPY  06/17/1986   vaginal     Recent CV Pertinent Labs: Lab Results  Component Value Date   CHOL 199 05/08/2020   HDL 59 05/08/2020   LDLCALC 122 (H) 05/08/2020   TRIG 88 05/08/2020   CHOLHDL 3.4 05/08/2020   INR 0.97 05/20/2009   BNP 179.8 (H) 05/07/2020   K 4.4 08/05/2020   MG 2.0 05/08/2020   BUN 10 08/05/2020   CREATININE 0.62 08/05/2020    Past medical and surgical history were reviewed and updated in EPIC.  Current Meds  Medication Sig   acetaminophen (TYLENOL) 650 MG CR tablet Take 650 mg by mouth every 8 (eight) hours as needed for pain.   Albuterol Sulfate (PROAIR RESPICLICK) 027 (90 Base) MCG/ACT AEPB Inhale 2 puffs into the lungs every 6 (six) hours as needed.   allopurinol (ZYLOPRIM) 300 MG tablet Take 1 tablet by mouth every other day.    amoxicillin (AMOXIL) 500 MG tablet Take 4 tablets (2,000 mg total) by mouth 2 (two) times daily. For dental procedures only   apixaban (ELIQUIS) 5 MG TABS tablet Take 5 mg by mouth 2 (two) times daily.    b complex vitamins tablet Take 1 tablet by mouth daily.   Bilberry, Vaccinium myrtillus, 1000 MG CAPS Take 2 capsules by  mouth daily.   Bioflavonoid Products (BIOFLEX) TABS Take 2 tablets by mouth daily.   carvedilol (COREG) 3.125 MG tablet Take 1 tablet (3.125 mg total) by mouth 2 (two) times daily with a meal.   Cholecalciferol (VITAMIN D3) 1000 UNITS CAPS Take 1 capsule by mouth daily.   ciprofloxacin (CIPRO) 500 MG tablet Take 500 mg by mouth 2 (two) times daily.   Coenzyme Q10 (COQ10) 100 MG CAPS Take 1 capsule by mouth daily.   CRANBERRY PO Take 1 tablet by mouth daily.   famotidine (PEPCID) 20 MG tablet Take 20 mg by mouth daily.   fexofenadine (ALLEGRA) 180 MG tablet Take 180 mg by  mouth daily.   Fluticasone-Umeclidin-Vilant (TRELEGY ELLIPTA) 100-62.5-25 MCG/ACT AEPB Inhale 100 mcg into the lungs daily.   gabapentin (NEURONTIN) 600 MG tablet Take 600 mg by mouth 2 (two) times daily.   Ginger, Zingiber officinalis, (GINGER ROOT PO) Take by mouth daily.   hydrALAZINE (APRESOLINE) 25 MG tablet TAKE 1 TABLET BY MOUTH 3 TIMES A DAY AS NEEDED FOR SYSTOLIC BP OF 222 OR GREATER   levothyroxine (SYNTHROID, LEVOTHROID) 50 MCG tablet Take 1 tablet by mouth daily.   losartan (COZAAR) 50 MG tablet Take 1 tablet (50 mg total) by mouth 2 (two) times daily.   meclizine (ANTIVERT) 12.5 MG tablet Take 1 tablet (12.5 mg total) by mouth every 8 (eight) hours as needed for dizziness.   Multiple Vitamins-Minerals (CENTRUM SILVER PO) Take 1 tablet by mouth daily.   Multiple Vitamins-Minerals (PRESERVISION AREDS 2 PO) Take 1 capsule by mouth 2 (two) times daily.    Omega-3 Fatty Acids (FISH OIL) 1000 MG CAPS Take 2 capsules by mouth daily.   PREMARIN vaginal cream Place 1 Applicatorful vaginally in the morning and at bedtime.    psyllium (METAMUCIL) 58.6 % powder Take 1 packet by mouth daily.   Pyridoxine HCl (VITAMIN B6 PO) Take 1,000 mg by mouth daily.   TURMERIC PO Take by mouth daily.   [DISCONTINUED] furosemide (LASIX) 20 MG tablet Take 1 tablet (20 mg total) by mouth daily as needed (weight gain / swelling).   [DISCONTINUED] hydrochlorothiazide (MICROZIDE) 12.5 MG capsule Take 1 capsule (12.5 mg total) by mouth daily.    Allergies: Metronidazole, Sulindac, and Naproxen  Social History   Tobacco Use   Smoking status: Never   Smokeless tobacco: Never  Vaping Use   Vaping Use: Never used  Substance Use Topics   Alcohol use: No   Drug use: No    Family History  Problem Relation Age of Onset   Stroke Mother    Melanoma Mother    Heart attack Father    Uterine cancer Sister    Melanoma Sister    Heart failure Sister    Heart attack Maternal Grandmother    Breast cancer  Daughter     Review of Systems: A 12-system review of systems was performed and was negative except as noted in the HPI.  --------------------------------------------------------------------------------------------------  Physical Exam: BP (!) 160/90 (BP Location: Left Arm, Patient Position: Sitting, Cuff Size: Large)    Kaufman (!) 58    Ht '5\' 3"'$  (1.6 m)    Wt 219 lb (99.3 kg)    SpO2 98% Comment: unjable to obtain   BMI 38.79 kg/m   General:  NAD. Neck: No JVD or HJR. Lungs: Clear to auscultation bilaterally without wheezes or crackles. Heart: Bradycardic but regular with 2/6 systolic murmur.  No rubs or gallops. Abdomen: Soft, nontender, nondistended. Extremities: 1+ pretibial edema  bilaterally.  EKG: Sinus bradycardia with nonspecific ST/T changes.  No significant change from prior tracing on 11/05/2020.  Lab Results  Component Value Date   WBC 6.6 05/08/2020   HGB 12.5 05/08/2020   HCT 37.3 05/08/2020   MCV 97.9 05/08/2020   PLT 251 05/08/2020    Lab Results  Component Value Date   NA 130 (L) 08/05/2020   K 4.4 08/05/2020   CL 92 (L) 08/05/2020   CO2 24 08/05/2020   BUN 10 08/05/2020   CREATININE 0.62 08/05/2020   GLUCOSE 87 08/05/2020   ALT 21 10/02/2019    Lab Results  Component Value Date   CHOL 199 05/08/2020   HDL 59 05/08/2020   LDLCALC 122 (H) 05/08/2020   TRIG 88 05/08/2020   CHOLHDL 3.4 05/08/2020    --------------------------------------------------------------------------------------------------  ASSESSMENT AND PLAN: Labile hypertension and dizziness: Jessica Kaufman has a long history of labile hypertension, which seems to have been exacerbated recently in the setting of UTI with several courses of antibiotics.  Blood pressure today is moderately elevated but overall better than many readings that she has had at home over the last 4 to 5 days.  Given that she also has some edema on examination today, we have agreed to discontinue HCTZ and escalate  furosemide to 40 mg daily.  We will continue current regimen of carvedilol and losartan.  We discussed importance of regular timing.  If she has spikes in her blood pressure, it is okay to use the as needed hydralazine that she was previously prescribed.  Chronic HFpEF: Ms. Devos has mild edema on examination today and also has put on a few pounds at home.  As above, we will discontinue HCTZ and escalate furosemide to 40 mg daily.  I will check a CBC and CMP today.  Aortic stenosis: Echo about 6 weeks ago showed stable moderate aortic stenosis with preserved LVEF.  Continue clinical follow-up with plans for repeat echo in a year unless symptoms develop sooner.  Paroxysmal atrial fibrillation: EKG today again shows sinus bradycardia.  We will continue low-dose carvedilol as well as apixaban.  Check CBC and CMP today in the setting of ongoing anticoagulation and escalation of diuretic therapy.  Follow-up: Return to clinic in 1 month.  Nelva Bush, MD 03/11/2021 1:15 PM

## 2021-03-11 NOTE — Patient Instructions (Addendum)
Medication Instructions:  ? ?Your physician has recommended you make the following change in your medication:  ? ?STOP Hydrochlorothiazide (HCTZ) ? ?INCREASE Furosemide (Lasix) 40 mg daily  ? ?*If you need a refill on your cardiac medications before your next appointment, please call your pharmacy* ? ? ?Lab Work: ? ?Today: CMET, CBC ? ?If you have labs (blood work) drawn today and your tests are completely normal, you will receive your results only by: ?MyChart Message (if you have MyChart) OR ?A paper copy in the mail ?If you have any lab test that is abnormal or we need to change your treatment, we will call you to review the results. ? ? ?Testing/Procedures: ? ?None ordered ? ? ?Follow-Up: ?At [facility], you and your health needs are our priority.  As part of our continuing mission to provide you with exceptional heart care, we have created designated Provider Care Teams.  These Care Teams include your primary Cardiologist (physician) and Advanced Practice Providers (APPs -  Physician Assistants and Nurse Practitioners) who all work together to provide you with the care you need, when you need it. ? ?We recommend signing up for the patient portal called "MyChart".  Sign up information is provided on this After Visit Summary.  MyChart is used to connect with patients for Virtual Visits (Telemedicine).  Patients are able to view lab/test results, encounter notes, upcoming appointments, etc.  Non-urgent messages can be sent to your provider as well.   ?To learn more about what you can do with MyChart, go to [email or web address].   ? ?Your next appointment:   ?1 month ? ?The format for your next appointment:   ?In Person ? ?Provider:   ?You may see Nelva Bush, MD or one of the following Advanced Practice Providers on your designated Care Team:   ?Murray Hodgkins, NP ?Christell Faith, PA-C ?Cadence Kathlen Mody, PA-C{ ? ? ?

## 2021-03-12 DIAGNOSIS — I10 Essential (primary) hypertension: Secondary | ICD-10-CM | POA: Diagnosis not present

## 2021-03-12 LAB — COMPREHENSIVE METABOLIC PANEL
ALT: 19 IU/L (ref 0–32)
AST: 27 IU/L (ref 0–40)
Albumin/Globulin Ratio: 1.9 (ref 1.2–2.2)
Albumin: 4.3 g/dL (ref 3.5–4.6)
Alkaline Phosphatase: 89 IU/L (ref 44–121)
BUN/Creatinine Ratio: 17 (ref 12–28)
BUN: 12 mg/dL (ref 10–36)
Bilirubin Total: 0.4 mg/dL (ref 0.0–1.2)
CO2: 22 mmol/L (ref 20–29)
Calcium: 9.6 mg/dL (ref 8.7–10.3)
Chloride: 95 mmol/L — ABNORMAL LOW (ref 96–106)
Creatinine, Ser: 0.72 mg/dL (ref 0.57–1.00)
Globulin, Total: 2.3 g/dL (ref 1.5–4.5)
Glucose: 87 mg/dL (ref 70–99)
Potassium: 5.1 mmol/L (ref 3.5–5.2)
Sodium: 132 mmol/L — ABNORMAL LOW (ref 134–144)
Total Protein: 6.6 g/dL (ref 6.0–8.5)
eGFR: 78 mL/min/{1.73_m2} (ref >59)

## 2021-03-12 LAB — CBC
Hematocrit: 39.8 % (ref 34.0–46.6)
Hemoglobin: 13.5 g/dL (ref 11.1–15.9)
MCH: 31.9 pg (ref 26.6–33.0)
MCHC: 33.9 g/dL (ref 31.5–35.7)
MCV: 94 fL (ref 79–97)
Platelets: 317 10*3/uL (ref 150–450)
RBC: 4.23 x10E6/uL (ref 3.77–5.28)
RDW: 13.6 % (ref 11.7–15.4)
WBC: 6 10*3/uL (ref 3.4–10.8)

## 2021-03-15 ENCOUNTER — Telehealth: Payer: Self-pay | Admitting: Internal Medicine

## 2021-03-15 NOTE — Telephone Encounter (Signed)
Pt c/o BP issue: STAT if pt c/o blurred vision, one-sided weakness or slurred speech ? ?1. What are your last 5 BP readings? 189/79, 212/98 ? ?2. Are you having any other symptoms (ex. Dizziness, headache, blurred vision, passed out)? no ? ?3. What is your BP issue? BP elevated over last week or 2 ?

## 2021-03-15 NOTE — Telephone Encounter (Signed)
I recommend that Ms. Jessica Kaufman continue her current medications and continue to monitor her blood pressure.  If possible, she should be particularly aware of her sodium intake and try to minimize it as much as possible.  If her blood pressure does not begin to improve by the Jessica Kaufman of this week, she should reach out to [place] so that further medication adjustments can be made. ? ?Jessica Bush, MD ?[facility] ? ?

## 2021-03-15 NOTE — Telephone Encounter (Signed)
I spoke with the patient's daughter, [name] (ok per DPR). ?She advised the patient's BP yesterday ~ 12 pm was 212/98. ?She gave the patient hydralazine 25 mg and her SBP came down to 189. ?She checked her again ~ 4 pm and her BP was 160. ?At bedtime, her SBP was 164, but she did not give her hydralazine last night. ? ?Per [name], the patient did require 4 hydralazine on Saturday night, she thinks, due to her elevated BP.  ?She does not have all the readings in front of her as they are at the patient's house and [name] is at her own house right now.  ?She does feel the patient's readings tend to trend upward ~ lunchtime.  ? ?The patient was seen in the office on 03/11/21 with Dr. Saunders Revel and advised to: ?- Stop HCTZ ?- Increase lasix to 40 mg once daily ? ?Per [name], the patient's swelling has not changed with these medication changes. ? ?I inquired what the patient's average BP was running before stopping HCTZ and she thought 130-140's. ?The max SBP she saw on HCTZ was 180. ? ?I inquired what her average BP has been over the weekend and she thought ~ 170, although she did have a reading of 130 one morning. ?HR's are running mainly 60-70's at home, but she will have some occasional readings in the 50's. ? ?I have confirmed the patient is taking: ?Coreg 3.125 mg BID  ?Losartan 50 mg BID ?Hydralazine 25 mg as needed for SBP >160 ?Lasix 40 mg once daily  ? ?The patient denies headaches/ blurred vision, but state she feels "funny" in her head when her BP is up.  ? ?Per [name], the patient is taking PT twice a week and PT is proceeding with her therapy with elevated BP's as they know the family is in contact with out office.  ? ?[name] advised the patient may also be a little stressed right now as people are in and out of the house trying to take care of things for her.  ? ?I advised [name], I will forward to Dr. Saunders Revel to review/ advise. ?I have also advised [name] with the patient's age, she should be ok running a SBP reading of  140's-150's vs a much lower reading to decrease her risk of falls. ? ?[name] is aware to continue the patient's current mediations until we can call back with MD recommendations. ?She voices understanding and is agreeable.  ? ?

## 2021-03-15 NOTE — Telephone Encounter (Signed)
Attempted to call the patient's daughter, [name] (ok per [facility]) with Dr. Darnelle Bos recommendations. ?No answer- I left a detailed message on Kathy's identified voice mail of MD recommendation as stated below. ? ?I also left a message of the patient's lab results.  ? ?I asked that [name] call back at any time with questions/ concerns.  ? ? ?

## 2021-03-19 ENCOUNTER — Emergency Department: Payer: PPO

## 2021-03-19 ENCOUNTER — Emergency Department
Admission: EM | Admit: 2021-03-19 | Discharge: 2021-03-19 | Disposition: A | Discharge status: Home / Self Care | Payer: PPO | Attending: Emergency Medicine | Admitting: Emergency Medicine

## 2021-03-19 ENCOUNTER — Other Ambulatory Visit: Payer: Self-pay

## 2021-03-19 DIAGNOSIS — S59912A Unspecified injury of left forearm, initial encounter: Secondary | ICD-10-CM | POA: Diagnosis present

## 2021-03-19 DIAGNOSIS — I10 Essential (primary) hypertension: Secondary | ICD-10-CM | POA: Insufficient documentation

## 2021-03-19 DIAGNOSIS — Z7901 Long term (current) use of anticoagulants: Secondary | ICD-10-CM | POA: Insufficient documentation

## 2021-03-19 DIAGNOSIS — I6782 Cerebral ischemia: Secondary | ICD-10-CM | POA: Diagnosis not present

## 2021-03-19 DIAGNOSIS — S51811A Laceration without foreign body of right forearm, initial encounter: Secondary | ICD-10-CM | POA: Insufficient documentation

## 2021-03-19 DIAGNOSIS — S5002XA Contusion of left elbow, initial encounter: Secondary | ICD-10-CM | POA: Insufficient documentation

## 2021-03-19 DIAGNOSIS — M50321 Other cervical disc degeneration at C4-C5 level: Secondary | ICD-10-CM | POA: Diagnosis not present

## 2021-03-19 DIAGNOSIS — S51819A Laceration without foreign body of unspecified forearm, initial encounter: Secondary | ICD-10-CM

## 2021-03-19 DIAGNOSIS — M5033 Other cervical disc degeneration, cervicothoracic region: Secondary | ICD-10-CM | POA: Diagnosis not present

## 2021-03-19 DIAGNOSIS — S0990XA Unspecified injury of head, initial encounter: Secondary | ICD-10-CM | POA: Insufficient documentation

## 2021-03-19 DIAGNOSIS — W010XXA Fall on same level from slipping, tripping and stumbling without subsequent striking against object, initial encounter: Secondary | ICD-10-CM | POA: Diagnosis not present

## 2021-03-19 DIAGNOSIS — S4991XA Unspecified injury of right shoulder and upper arm, initial encounter: Secondary | ICD-10-CM | POA: Diagnosis not present

## 2021-03-19 DIAGNOSIS — Z23 Encounter for immunization: Secondary | ICD-10-CM | POA: Diagnosis not present

## 2021-03-19 DIAGNOSIS — Y9389 Activity, other specified: Secondary | ICD-10-CM | POA: Diagnosis not present

## 2021-03-19 DIAGNOSIS — S51812A Laceration without foreign body of left forearm, initial encounter: Secondary | ICD-10-CM | POA: Insufficient documentation

## 2021-03-19 DIAGNOSIS — S199XXA Unspecified injury of neck, initial encounter: Secondary | ICD-10-CM | POA: Diagnosis not present

## 2021-03-19 DIAGNOSIS — I639 Cerebral infarction, unspecified: Secondary | ICD-10-CM | POA: Diagnosis not present

## 2021-03-19 DIAGNOSIS — G319 Degenerative disease of nervous system, unspecified: Secondary | ICD-10-CM | POA: Diagnosis not present

## 2021-03-19 DIAGNOSIS — W19XXXA Unspecified fall, initial encounter: Secondary | ICD-10-CM | POA: Diagnosis not present

## 2021-03-19 MED ORDER — BACITRACIN-NEOMYCIN-POLYMYXIN 400-5-5000 EX OINT
TOPICAL_OINTMENT | CUTANEOUS | Status: DC | PRN
  Administered 2021-03-19: 2 via TOPICAL

## 2021-03-19 MED ORDER — TETANUS-DIPHTH-ACELL PERTUSSIS 5-2.5-18.5 LF-MCG/0.5 IM SUSY
0.5000 mL | PREFILLED_SYRINGE | Freq: Once | INTRAMUSCULAR | Status: AC
  Administered 2021-03-19: 0.5 mL via INTRAMUSCULAR
  Filled 2021-03-19: qty 0.5

## 2021-03-19 NOTE — ED Triage Notes (Addendum)
Pt fell backwards injuring bilateral forearms, right shoulder and possibly striking posterior skull. Pt with multiple skin tears on bilateral forearms. Pt states lower back pain as well. No loc. Pt states she lost her footing while putting shoes on.  ?

## 2021-03-19 NOTE — ED Notes (Signed)
Coban applied over dressing at R arm as hematoma larger than when pt arrived to room; daughter and pt made aware to check site of hematoma frequently and to check fingers for cap refill, warmth, color, and pulse. Pt and daughter agreeable to do so.  ?

## 2021-03-19 NOTE — Discharge Instructions (Signed)
Please follow-up with primary care for any symptom of concern. ?

## 2021-03-19 NOTE — ED Provider Notes (Signed)
? ?[facility] ?Provider Note ? ? ? Event Date/Time  ? First MD Initiated Contact with Patient 03/19/21 2211   ?  (approximate) ? ? ?History  ? ?Fall ? ? ?HPI ? ?Jessica Kaufman is a 88 y.o. female with a history of A-fib on Eliquis, hypertension, and as listed in EMR presents to the emergency department for evaluation after a mechanical, nonsyncopal fall.  She was leaning against the post of the stairwell to put her shoe on and lost her balance.  Her right arm scraped against the square post and she landed backward on her left elbow.  No loss of consciousness.  Unsure of last Tdap. ? ?  ? ? ?Physical Exam  ? ?Triage Vital Signs: ?ED Triage Vitals  ?Enc Vitals Group  ?   BP 03/19/21 2058 (!) 164/113  ?   Pulse Rate 03/19/21 2058 62  ?   Resp 03/19/21 2058 16  ?   Temp 03/19/21 2058 97.9 ?F (36.6 ?C)  ?   Temp Source 03/19/21 2058 Oral  ?   SpO2 03/19/21 2058 94 %  ?   Weight 03/19/21 2059 220 lb 7.4 oz (100 kg)  ?   Height 03/19/21 2059 '5\' 3"'$  (1.6 m)  ?   Head Circumference --   ?   Peak Flow --   ?   Pain Score 03/19/21 2059 0  ?   Pain Loc --   ?   Pain Edu? --   ?   Excl. in [place]? --   ? ? ?Most recent vital signs: ?Vitals:  ? 03/19/21 2058  ?BP: (!) 164/113  ?Pulse: 62  ?Resp: 16  ?Temp: 97.9 ?F (36.6 ?C)  ?SpO2: 94%  ? ? ?General: Awake, no distress.  ?CV:  Good peripheral perfusion.  ?Resp:  Normal effort.  ?Abd:  No distention.  ?Other:  Skin tears to bilateral forearms.  Contusion to left elbow.  Able to demonstrate flexion and extension of left arm.  No hip pain.  No lower extremity pain. ? ? ?ED Results / Procedures / Treatments  ? ?Labs ?(all labs ordered are listed, but only abnormal results are displayed) ?Labs Reviewed - No data to display ? ? ?EKG ? ?Not indicated ? ? ?RADIOLOGY ? ?Image and radiology report reviewed by me. ? ?CT head and cervical spine negative for acute concerns. ? ?PROCEDURES: ? ?Critical Care performed: No ? ?Procedures ? ? ?MEDICATIONS ORDERED IN  ED: ?Medications  ?neomycin-bacitracin-polymyxin (NEOSPORIN) ointment packet (2 application. Topical Given 03/19/21 2211)  ?Tdap (BOOSTRIX) injection 0.5 mL (has no administration in time range)  ? ? ? ?IMPRESSION / MDM / ASSESSMENT AND PLAN / ED COURSE  ? ?I have reviewed the triage note. ? ?Differential diagnosis includes, but is not limited to, ICH, cervical spine injury, skin tears ? ?88 year old female presenting to the emergency department for treatment and evaluation after mechanical, nonsyncopal fall after she lost her balance while putting on her shoes.  See HPI for further details. ? ?While awaiting ER room assignment, CT of the head and cervical spine was obtained.  Both are negative for acute findings. ? ?On exam, patient is alert and oriented.  Blood pressure recheck was 162/74.  Nursing staff dressed wounds with antibiotic ointment dressings.  No active bleeding at this time.  Patient feels safe to be discharged home.  Tdap to be updated prior to discharge.  ER return precautions discussed.  Daughter at bedside to transport patient home. ? ?  ? ? ?  FINAL CLINICAL IMPRESSION(S) / ED DIAGNOSES  ? ?Final diagnoses:  ?Skin tear of forearm without complication, initial encounter  ?Minor head injury, initial encounter  ? ? ? ?Rx / DC Orders  ? ?ED Discharge Orders   ? ? None  ? ?  ? ? ? ?Note:  This document was prepared using Dragon voice recognition software and may include unintentional dictation errors. ?  ?Victorino Dike, FNP ?03/19/21 2237 ? ?  ?Carrie Mew, MD ?03/20/21 0002 ? ?

## 2021-03-19 NOTE — ED Notes (Signed)
No hematoma or bleeding noted at pt's head. Pt neuro baseline per daughter at bedside; A&Ox4. Pt's L and R arms with skin tears; gauze removed from triage. Ointment applied to sites and non-adhesive dressings applied. Pt unsteady/unsure of self switching from wheelchair to stretcher; 3 person assist.  ?

## 2021-03-19 NOTE — ED Notes (Signed)
Pt taken to vehicle in wheelchair.  ?

## 2021-03-19 NOTE — ED Notes (Signed)
Provider CT at bedside.  ?

## 2021-03-24 ENCOUNTER — Telehealth: Payer: Self-pay | Admitting: Internal Medicine

## 2021-03-24 DIAGNOSIS — R609 Edema, unspecified: Secondary | ICD-10-CM

## 2021-03-24 DIAGNOSIS — Z79899 Other long term (current) drug therapy: Secondary | ICD-10-CM

## 2021-03-24 DIAGNOSIS — I5032 Chronic diastolic (congestive) heart failure: Secondary | ICD-10-CM

## 2021-03-24 MED ORDER — FUROSEMIDE 40 MG PO TABS: 40.0000 mg | ORAL_TABLET | Freq: Two times a day (BID) | ORAL | Status: DC

## 2021-03-24 NOTE — Telephone Encounter (Signed)
I recommend increasing furosemide to 40 mg BID and checking a BMP in 1 week.  It is okay to continuing giving hydralazine as needed.  The maximum daily dose is typically 300 mg (given up to 100 mg TID). ? ?Nelva Bush, MD ?[facility] ? ?

## 2021-03-24 NOTE — Telephone Encounter (Signed)
Pt c/o swelling: STAT is pt has developed SOB within 24 hours ? ?If swelling, where is the swelling located? BLE w/ weeping  ? ?How much weight have you gained and in what time span? Unknown  ? ?Have you gained 3 pounds in a day or 5 pounds in a week? Not sure but per daughter significant as patient is swollen and puffy  ? ?Do you have a log of your daily weights (if so, list)?  No  ? ? ?Are you currently taking a fluid pill? Yes  ? ?Are you currently SOB? No  ? ?Have you traveled recently? Went to [place] this weekend  ? ?254 avg systolic pressure Friday  ?180's avg now  ? ?  ?

## 2021-03-24 NOTE — Telephone Encounter (Signed)
Spoke with pt's daughter, [name], notified of recc below.  ?Pt will incr furosemide to 40 mg BID and has been scheduled to have BMP in our office next week 04/01/21.  ? ?[name] has no further questions at this time.  ?

## 2021-03-24 NOTE — Telephone Encounter (Signed)
Spoke with pt's daughter, [name].  ?Pt with incr leg edema, reported as pitting and weeping.  ?Swelling is below knee in both legs.  ?Denies shortness of breath.  ?Weight this morning is 218 lb.  ? ?Also concerns with pt's BP.  ?Pt had mechanical fall with skin tear last week and went to ER 3/17.  ?[name] states that prior to pt's fall her SBP stayed around 140.  ?After fall, SBP continues to trend upward. Yesterday SBP 180s.  ? ?This morning at 0750 BP 152/74 HR 67 (this is after all AM BP meds: Losartan, Carvedilol, Furosemide) ?Pt does continue recently incr dose Furosemide 40 mg daily.  ? ?At 1015 BP 172/72  HR 65 - Hydralazine given ? ?1130  BP 176/90  HR 65 ? ?[name] states she has been giving pt Hydralazine up to four times per day and reports drops BP average of 10 points.  ?[name] asks what is the max dose of Hydralazine that she may give pt if needed.  ?

## 2021-03-25 DIAGNOSIS — I10 Essential (primary) hypertension: Secondary | ICD-10-CM | POA: Diagnosis not present

## 2021-03-25 DIAGNOSIS — Z515 Encounter for palliative care: Secondary | ICD-10-CM | POA: Diagnosis not present

## 2021-03-25 DIAGNOSIS — D692 Other nonthrombocytopenic purpura: Secondary | ICD-10-CM | POA: Diagnosis not present

## 2021-03-25 DIAGNOSIS — I480 Paroxysmal atrial fibrillation: Secondary | ICD-10-CM | POA: Diagnosis not present

## 2021-03-25 DIAGNOSIS — D6869 Other thrombophilia: Secondary | ICD-10-CM | POA: Diagnosis not present

## 2021-03-25 DIAGNOSIS — R3589 Other polyuria: Secondary | ICD-10-CM | POA: Diagnosis not present

## 2021-03-25 MED ORDER — FUROSEMIDE 40 MG PO TABS
40.0000 mg | ORAL_TABLET | Freq: Two times a day (BID) | ORAL | 0 refills | Status: DC
Start: 2021-03-25 — End: 2021-03-26

## 2021-03-25 MED ORDER — FUROSEMIDE 40 MG PO TABS
40.0000 mg | ORAL_TABLET | Freq: Two times a day (BID) | ORAL | 0 refills | Status: DC
Start: 2021-03-25 — End: 2021-03-25

## 2021-03-25 NOTE — Telephone Encounter (Signed)
Refill sent.

## 2021-03-25 NOTE — Addendum Note (Signed)
Addended by: Britt Bottom on: 03/25/2021 04:38 PM ? ? Modules accepted: Orders ? ?

## 2021-03-25 NOTE — Telephone Encounter (Signed)
?*  STAT* If patient is at the pharmacy, call can be transferred to refill team. ? ? ?1. Which medications need to be refilled? (please list name of each medication and dose if known) furosemide 40 MG 2 times daily  ? ?2. Which pharmacy/location (including street and city if local pharmacy) is medication to be sent to? [facility] [place]  ? ?3. Do they need a 30 day or 90 day supply? 90 day  ? ?

## 2021-03-25 NOTE — Telephone Encounter (Signed)
Requested Prescriptions  ? ?Signed Prescriptions Disp Refills  ? furosemide (LASIX) 40 MG tablet 180 tablet 0  ?  Sig: Take 1 tablet (40 mg total) by mouth 2 (two) times daily.  ?  Authorizing Provider: END, CHRISTOPHER  ?  Ordering User: Othelia [place]ing C  ? ? ?

## 2021-03-25 NOTE — Addendum Note (Signed)
Addended by: Darlyne Russian on: 03/25/2021 04:38 PM ? ? Modules accepted: Orders ? ?

## 2021-03-26 ENCOUNTER — Telehealth: Payer: Self-pay | Admitting: Internal Medicine

## 2021-03-26 MED ORDER — FUROSEMIDE 40 MG PO TABS: 40.0000 mg | ORAL_TABLET | Freq: Every day | ORAL | Status: DC

## 2021-03-26 NOTE — Telephone Encounter (Signed)
Pt c/o BP issue: STAT if pt c/o blurred vision, one-sided weakness or slurred speech  1. What are your last 5 BP readings?  BP this morning 100/69 HR 109 Just now 110/70 HR 73 Yesterday 158/82 HR 78    2. Are you having any other symptoms (ex. Dizziness, headache, blurred vision, passed out)? Weakness in legs and arms, patient took only 20 mg of Furosemide this morning. Leg swelling has improved, no weeping.   3. What is your BP issue? Too low  Daughter states she is pushing fluids. Please call to discuss.

## 2021-03-26 NOTE — Telephone Encounter (Signed)
Called and spoke to pt's daughter, [name].  ?Per [name], pt symptoms have resolved. Weakness improved. BP normal. Swelling has improved and weeping has subsided. [name] was concerned with how pt felt this morning and with low BP at 100/69.  ? ?Per Dr. Saunders Revel, we can cut pt's furosemide back to 40 mg daily instead of twice daily.  ?[name] voiced understanding. Pt will proceed with Lasix 40 mg daily. Will continue to monitor BP, symptoms, and will let us know of any further changes or concerns.  ? ?Otherwise, pt will follow up with BMET as scheduled 04/01/21. [name] states she may have lab drawn at home through [facility] and have labs either faxed to our office or sent via mychart.  ?

## 2021-04-01 ENCOUNTER — Other Ambulatory Visit: Payer: PPO

## 2021-04-02 ENCOUNTER — Other Ambulatory Visit: Payer: PPO

## 2021-04-05 ENCOUNTER — Other Ambulatory Visit: Payer: PPO

## 2021-04-05 DIAGNOSIS — R42 Dizziness and giddiness: Secondary | ICD-10-CM | POA: Diagnosis not present

## 2021-04-05 DIAGNOSIS — G319 Degenerative disease of nervous system, unspecified: Secondary | ICD-10-CM | POA: Diagnosis not present

## 2021-04-05 DIAGNOSIS — I129 Hypertensive chronic kidney disease with stage 1 through stage 4 chronic kidney disease, or unspecified chronic kidney disease: Secondary | ICD-10-CM | POA: Diagnosis not present

## 2021-04-05 DIAGNOSIS — Z96643 Presence of artificial hip joint, bilateral: Secondary | ICD-10-CM | POA: Diagnosis not present

## 2021-04-05 DIAGNOSIS — E039 Hypothyroidism, unspecified: Secondary | ICD-10-CM | POA: Diagnosis not present

## 2021-04-05 DIAGNOSIS — N182 Chronic kidney disease, stage 2 (mild): Secondary | ICD-10-CM | POA: Diagnosis not present

## 2021-04-05 DIAGNOSIS — R32 Unspecified urinary incontinence: Secondary | ICD-10-CM | POA: Diagnosis not present

## 2021-04-05 DIAGNOSIS — Z87440 Personal history of urinary (tract) infections: Secondary | ICD-10-CM | POA: Diagnosis not present

## 2021-04-05 DIAGNOSIS — Z7951 Long term (current) use of inhaled steroids: Secondary | ICD-10-CM | POA: Diagnosis not present

## 2021-04-05 DIAGNOSIS — Z7901 Long term (current) use of anticoagulants: Secondary | ICD-10-CM | POA: Diagnosis not present

## 2021-04-05 DIAGNOSIS — N390 Urinary tract infection, site not specified: Secondary | ICD-10-CM | POA: Diagnosis not present

## 2021-04-05 DIAGNOSIS — E559 Vitamin D deficiency, unspecified: Secondary | ICD-10-CM | POA: Diagnosis not present

## 2021-04-05 DIAGNOSIS — I471 Supraventricular tachycardia: Secondary | ICD-10-CM | POA: Diagnosis not present

## 2021-04-05 DIAGNOSIS — R296 Repeated falls: Secondary | ICD-10-CM | POA: Diagnosis not present

## 2021-04-05 DIAGNOSIS — I480 Paroxysmal atrial fibrillation: Secondary | ICD-10-CM | POA: Diagnosis not present

## 2021-04-05 DIAGNOSIS — M109 Gout, unspecified: Secondary | ICD-10-CM | POA: Diagnosis not present

## 2021-04-05 DIAGNOSIS — M069 Rheumatoid arthritis, unspecified: Secondary | ICD-10-CM | POA: Diagnosis not present

## 2021-04-05 DIAGNOSIS — Z8673 Personal history of transient ischemic attack (TIA), and cerebral infarction without residual deficits: Secondary | ICD-10-CM | POA: Diagnosis not present

## 2021-04-05 DIAGNOSIS — E7800 Pure hypercholesterolemia, unspecified: Secondary | ICD-10-CM | POA: Diagnosis not present

## 2021-04-05 DIAGNOSIS — G629 Polyneuropathy, unspecified: Secondary | ICD-10-CM | POA: Diagnosis not present

## 2021-04-06 ENCOUNTER — Other Ambulatory Visit (INDEPENDENT_AMBULATORY_CARE_PROVIDER_SITE_OTHER): Payer: PPO

## 2021-04-06 DIAGNOSIS — R609 Edema, unspecified: Secondary | ICD-10-CM

## 2021-04-06 DIAGNOSIS — Z79899 Other long term (current) drug therapy: Secondary | ICD-10-CM

## 2021-04-06 DIAGNOSIS — I5032 Chronic diastolic (congestive) heart failure: Secondary | ICD-10-CM | POA: Diagnosis not present

## 2021-04-07 LAB — BASIC METABOLIC PANEL
BUN/Creatinine Ratio: 20 (ref 12–28)
BUN: 10 mg/dL (ref 10–36)
CO2: 24 mmol/L (ref 20–29)
Calcium: 9.4 mg/dL (ref 8.7–10.3)
Chloride: 96 mmol/L (ref 96–106)
Creatinine, Ser: 0.51 mg/dL — ABNORMAL LOW (ref 0.57–1.00)
Glucose: 86 mg/dL (ref 70–99)
Potassium: 5 mmol/L (ref 3.5–5.2)
Sodium: 133 mmol/L — ABNORMAL LOW (ref 134–144)
eGFR: 86 mL/min/{1.73_m2} (ref >59)

## 2021-04-12 DIAGNOSIS — H5203 Hypermetropia, bilateral: Secondary | ICD-10-CM | POA: Diagnosis not present

## 2021-04-12 DIAGNOSIS — H353230 Exudative age-related macular degeneration, bilateral, stage unspecified: Secondary | ICD-10-CM | POA: Diagnosis not present

## 2021-04-12 DIAGNOSIS — H52223 Regular astigmatism, bilateral: Secondary | ICD-10-CM | POA: Diagnosis not present

## 2021-04-15 ENCOUNTER — Encounter: Payer: Self-pay | Admitting: Physician Assistant

## 2021-04-15 ENCOUNTER — Ambulatory Visit (INDEPENDENT_AMBULATORY_CARE_PROVIDER_SITE_OTHER): Payer: PPO | Admitting: Physician Assistant

## 2021-04-15 VITALS — BP 130/60 | HR 71 | Ht 63.0 in | Wt 215.4 lb

## 2021-04-15 DIAGNOSIS — I480 Paroxysmal atrial fibrillation: Secondary | ICD-10-CM

## 2021-04-15 DIAGNOSIS — Z87898 Personal history of other specified conditions: Secondary | ICD-10-CM | POA: Diagnosis not present

## 2021-04-15 DIAGNOSIS — R0989 Other specified symptoms and signs involving the circulatory and respiratory systems: Secondary | ICD-10-CM

## 2021-04-15 DIAGNOSIS — I350 Nonrheumatic aortic (valve) stenosis: Secondary | ICD-10-CM | POA: Diagnosis not present

## 2021-04-15 DIAGNOSIS — R42 Dizziness and giddiness: Secondary | ICD-10-CM | POA: Diagnosis not present

## 2021-04-15 DIAGNOSIS — I5032 Chronic diastolic (congestive) heart failure: Secondary | ICD-10-CM | POA: Diagnosis not present

## 2021-04-15 MED ORDER — FUROSEMIDE 40 MG PO TABS: 40.0000 mg | ORAL_TABLET | ORAL | Status: DC

## 2021-04-15 NOTE — Patient Instructions (Addendum)
Medication Instructions:  ?Your physician has recommended you make the following change in your medication:  ? ?CHANGE Furosemide to 40 mg in the AM and 20 mg in the PM ? ?*If you need a refill on your cardiac medications before your next appointment, please call your pharmacy* ? ? ?Lab Work: ?BMET in one week. Go to [facility] entrance and check in at registration. ? ?If you have labs (blood work) drawn today and your tests are completely normal, you will receive your results only by: ?MyChart Message (if you have MyChart) OR ?A paper copy in the mail ?If you have any lab test that is abnormal or we need to change your treatment, we will call you to review the results. ? ? ?Testing/Procedures: ?None ? ? ?Follow-Up: ?At [facility], you and your health needs are our priority.  As part of our continuing mission to provide you with exceptional heart care, we have created designated Provider Care Teams.  These Care Teams include your primary Cardiologist (physician) and Advanced Practice Providers (APPs -  Physician Assistants and Nurse Practitioners) who all work together to provide you with the care you need, when you need it. ? ? ?Your next appointment:   ?2 month(s) ? ?The format for your next appointment:   ?In Person ? ?Provider:   ?Nelva Bush, MD or Christell Faith, PA-C  ? ?Important Information About Sugar ? ? ? ? ?  ?

## 2021-04-15 NOTE — Progress Notes (Signed)
? ?Cardiology Office Note   ? ?Date:  04/15/2021  ? ?ID:  Jessica Kaufman, DOB 1925/01/28, MRN [id number] ? ?PCP:  Leonides Sake, MD  ?Cardiologist:  Nelva Bush, MD  ?Electrophysiologist:  None  ? ?Chief Complaint: Follow up ? ?History of Present Illness:  ? ?Jessica Kaufman is a 88 y.o. female with history of PAF, HFpEF, labile hypertension mild to moderate aortic stenosis, syncope, PVCs, HLD, mechanical falls, and rheumatoid arthritis who presents for follow-up of hypertension. ? ?Zio patch in 02/2019 showed a predominant rhythm of sinus with an average rate of 62 bpm (range 41 to 107 bpm in sinus), 39 episodes of paroxysmal SVT lasting up to 10 beats with a maximum rate of 184 bpm, rare PACs and PVCs, brief episodes of junctional rhythm occurred often in the setting of PACs and PVCs, patient triggered events corresponded to sinus rhythm, PACs, PVCs, and transient junctional rhythm.  Echo in 03/2019 showed an EF of 55 to 60%, no regional wall motion abnormalities, normal LV diastolic function parameters, normal RV systolic function and ventricular cavity size, trivial mitral regurgitation, moderate aortic stenosis, and an estimated right atrial pressure of 3 mmHg.  Echo in 05/2020 showed an EF of 50 to 55%, mild LVH, indeterminate LV diastolic function parameters, normal RV systolic function and ventricular cavity size, mild mitral regurgitation, moderate aortic stenosis, and an estimated right atrial pressure of 8 mmHg.  Most recent echo from 01/2021 demonstrated an EF of 60 to 65%, no regional wall motion abnormalities, mild LVH, grade 1 diastolic dysfunction, normal RV systolic function and ventricular cavity size, mild mitral regurgitation, and mild to moderate aortic valve stenosis with a mean gradient of 15 mmHg and a valve area of 1.58 cm?. ? ?She was last seen in the office in 03/2021, at which time the patient and her daughter were concerned regarding recent spikes in blood pressure.  This  coincided with a refractory UTI, for which she was on her third course of antibiotic therapy.  BP had been labile ranging from 127/79 to 225/115 over the preceding 4 days.  She had taken several doses of as needed hydralazine.  Lower extremity swelling was also a little bit worse with associated 2 to 3 pound weight gain above baseline.  HCTZ was discontinued and furosemide was titrated to 40 mg daily.  She was otherwise continued on carvedilol and losartan, as well as as needed hydralazine. ? ?She was seen in the ED on 03/19/2021 after sustaining a mechanical fall without syncope.  CT head showed no acute intracranial abnormality. ? ?Our office was notified on 03/24/2021 that the patient was having increasing lower extremity swelling with BP readings in the 564P to 329J systolic.  Given this, her for Lasix was titrated to 40 mg twice daily.  With this, we were notified on 03/26/2021 that the patient felt weak and was having low blood pressures in the low 100s ranging to 188C systolic.  Lower extremity swelling had improved.  Lasix was decreased back to 40 mg daily. ? ?She comes in accompanied by her daughter today and is doing well from a cardiac perspective.  She is without symptoms of angina or decompensation.  Since she was last seen, she has been taking furosemide 40 mg daily with an extra 20 mg on an every other day basis.  With this, they have noted a stabilization of the patient's lower extremity swelling and also some stabilization of her blood pressure readings.  Most BP readings now are  in the 782N systolic with an occasional reading in the 562Z to 308M systolic.  Max BP at home of 578 mmHg systolic.  No dizziness, presyncope, or syncope.  No symptoms concerning for bleeding.  No falls since her ED visit in 03/2021.  The hematoma is resolving along the volar aspect of her right upper extremity from this fall. ? ? ?Labs independently reviewed: ?04/2021 - BUN 10, serum creatinine 0.51, potassium 5.0 ?03/2021 -  albumin 4.3, AST/ALT normal, Hgb 13.5, PLT 317 ?05/2020 - magnesium 2.0, TSH normal, TC 199, TG 88, HDL 59, LDL 122, A1c 5.5 ? ?Past Medical History:  ?Diagnosis Date  ? Aortic valve disease   ? Mild-moderate aortic stenosis  ? Asthma   ? Cancer [facility])   ? basal cell   ? Gout   ? Hyperlipidemia   ? Hypertension   ? Hypertensive heart disease without heart failure   ? Hypothyroidism   ? PAF (paroxysmal atrial fibrillation) ([name])   ? PVC (premature ventricular contraction)   ? Rheumatoid arthritis ([name])   ? Ventricular premature depolarization   ? ? ?Past Surgical History:  ?Procedure Laterality Date  ? BLADDER SURGERY  10/19/2010  ? [place]  ? BREAST BIOPSY Left 1986  ? CARDIAC SURGERY  2009  ? PVC  ? CATARACT EXTRACTION Left 11/15/00  ? CATARACT EXTRACTION Right   ? ELBOW SURGERY  06/1990  ? HIP SURGERY Left 05/28/09  ? HIP SURGERY Right 06/30/03  ? HYSTEROSCOPY  06/17/1986  ? vaginal  ? ? ?Current Medications: ?Current Meds  ?Medication Sig  ? acetaminophen (TYLENOL) 650 MG CR tablet Take 650 mg by mouth every 8 (eight) hours as needed for pain.  ? Albuterol Sulfate (PROAIR RESPICLICK) 469 (90 Base) MCG/ACT AEPB Inhale 2 puffs into the lungs every 6 (six) hours as needed.  ? allopurinol (ZYLOPRIM) 300 MG tablet Take 1 tablet by mouth every other day.   ? amoxicillin (AMOXIL) 500 MG tablet Take 4 tablets (2,000 mg total) by mouth 2 (two) times daily. For dental procedures only  ? apixaban (ELIQUIS) 5 MG TABS tablet Take 5 mg by mouth 2 (two) times daily.   ? b complex vitamins tablet Take 1 tablet by mouth daily.  ? Bilberry, Vaccinium myrtillus, 1000 MG CAPS Take 2 capsules by mouth daily.  ? Bioflavonoid Products (BIOFLEX) TABS Take 2 tablets by mouth daily.  ? carvedilol (COREG) 3.125 MG tablet Take 1 tablet (3.125 mg total) by mouth 2 (two) times daily with a meal.  ? Cholecalciferol (VITAMIN D3) 1000 UNITS CAPS Take 1 capsule by mouth daily.  ? Coenzyme Q10 (COQ10) 100 MG CAPS Take 1 capsule by  mouth daily.  ? CRANBERRY PO Take 1 tablet by mouth daily.  ? famotidine (PEPCID) 20 MG tablet Take 20 mg by mouth daily.  ? fexofenadine (ALLEGRA) 180 MG tablet Take 180 mg by mouth daily.  ? Fluticasone-Umeclidin-Vilant (TRELEGY ELLIPTA) 100-62.5-25 MCG/ACT AEPB Inhale 100 mcg into the lungs daily.  ? gabapentin (NEURONTIN) 600 MG tablet Take 600 mg by mouth 2 (two) times daily.  ? Ginger, Zingiber officinalis, (GINGER ROOT PO) Take by mouth daily.  ? hydrALAZINE (APRESOLINE) 25 MG tablet TAKE 1 TABLET BY MOUTH 3 TIMES A DAY AS NEEDED FOR SYSTOLIC BP OF 629 OR GREATER  ? levothyroxine (SYNTHROID, LEVOTHROID) 50 MCG tablet Take 1 tablet by mouth daily.  ? losartan (COZAAR) 50 MG tablet Take 1 tablet (50 mg total) by mouth 2 (two) times daily.  ?  meclizine (ANTIVERT) 12.5 MG tablet Take 1 tablet (12.5 mg total) by mouth every 8 (eight) hours as needed for dizziness.  ? Multiple Vitamins-Minerals (CENTRUM SILVER PO) Take 1 tablet by mouth daily.  ? Multiple Vitamins-Minerals (PRESERVISION AREDS 2 PO) Take 1 capsule by mouth 2 (two) times daily.   ? Omega-3 Fatty Acids (FISH OIL) 1000 MG CAPS Take 2 capsules by mouth daily.  ? PREMARIN vaginal cream Place 1 Applicatorful vaginally in the morning and at bedtime.   ? psyllium (METAMUCIL) 58.6 % powder Take 1 packet by mouth daily.  ? Pyridoxine HCl (VITAMIN B6 PO) Take 1,000 mg by mouth daily.  ? TURMERIC PO Take by mouth daily.  ? [DISCONTINUED] furosemide (LASIX) 40 MG tablet Take 1 tablet (40 mg total) by mouth daily.  ? ? ?Allergies:   Metronidazole, Sulindac, and Naproxen  ? ?Social History  ? ?Socioeconomic History  ? Marital status: Widowed  ?  Spouse name: Not on file  ? Number of children: 7  ? Years of education: 12th  ? Highest education level: Not on file  ?Occupational History  ? Occupation: Retired  ?Tobacco Use  ? Smoking status: Never  ? Smokeless tobacco: Never  ?Vaping Use  ? Vaping Use: Never used  ?Substance and Sexual Activity  ? Alcohol use: No  ?  Drug use: No  ? Sexual activity: Not on file  ?Other Topics Concern  ? Not on file  ?Social History Narrative  ? Patient lives at home alone.Caffeine Use: 1 cup daily 7 children (1 deceased)  ? ?Social Determinants

## 2021-04-23 DIAGNOSIS — I350 Nonrheumatic aortic (valve) stenosis: Secondary | ICD-10-CM | POA: Diagnosis not present

## 2021-04-23 DIAGNOSIS — D692 Other nonthrombocytopenic purpura: Secondary | ICD-10-CM | POA: Diagnosis not present

## 2021-04-23 DIAGNOSIS — D6869 Other thrombophilia: Secondary | ICD-10-CM | POA: Diagnosis not present

## 2021-04-23 DIAGNOSIS — G4733 Obstructive sleep apnea (adult) (pediatric): Secondary | ICD-10-CM | POA: Diagnosis not present

## 2021-04-23 DIAGNOSIS — Z515 Encounter for palliative care: Secondary | ICD-10-CM | POA: Diagnosis not present

## 2021-04-23 DIAGNOSIS — I480 Paroxysmal atrial fibrillation: Secondary | ICD-10-CM | POA: Diagnosis not present

## 2021-04-23 DIAGNOSIS — I119 Hypertensive heart disease without heart failure: Secondary | ICD-10-CM | POA: Diagnosis not present

## 2021-04-26 DIAGNOSIS — R0989 Other specified symptoms and signs involving the circulatory and respiratory systems: Secondary | ICD-10-CM | POA: Diagnosis not present

## 2021-04-26 DIAGNOSIS — R42 Dizziness and giddiness: Secondary | ICD-10-CM | POA: Diagnosis not present

## 2021-04-26 DIAGNOSIS — Z87898 Personal history of other specified conditions: Secondary | ICD-10-CM | POA: Diagnosis not present

## 2021-04-26 DIAGNOSIS — Z111 Encounter for screening for respiratory tuberculosis: Secondary | ICD-10-CM | POA: Diagnosis not present

## 2021-04-26 DIAGNOSIS — I5032 Chronic diastolic (congestive) heart failure: Secondary | ICD-10-CM | POA: Diagnosis not present

## 2021-04-26 DIAGNOSIS — I480 Paroxysmal atrial fibrillation: Secondary | ICD-10-CM | POA: Diagnosis not present

## 2021-04-26 DIAGNOSIS — I350 Nonrheumatic aortic (valve) stenosis: Secondary | ICD-10-CM | POA: Diagnosis not present

## 2021-04-27 LAB — BASIC METABOLIC PANEL
BUN/Creatinine Ratio: 25 (ref 12–28)
BUN: 15 mg/dL (ref 10–36)
CO2: 26 mmol/L (ref 20–29)
Calcium: 9.8 mg/dL (ref 8.7–10.3)
Chloride: 99 mmol/L (ref 96–106)
Creatinine, Ser: 0.61 mg/dL (ref 0.57–1.00)
Glucose: 79 mg/dL (ref 70–99)
Potassium: 4.6 mmol/L (ref 3.5–5.2)
Sodium: 139 mmol/L (ref 134–144)
eGFR: 83 mL/min/{1.73_m2} (ref >59)

## 2021-05-03 DIAGNOSIS — Z1231 Encounter for screening mammogram for malignant neoplasm of breast: Secondary | ICD-10-CM | POA: Diagnosis not present

## 2021-05-04 ENCOUNTER — Telehealth: Payer: Self-pay | Admitting: Internal Medicine

## 2021-05-04 DIAGNOSIS — H6123 Impacted cerumen, bilateral: Secondary | ICD-10-CM | POA: Diagnosis not present

## 2021-05-04 DIAGNOSIS — R600 Localized edema: Secondary | ICD-10-CM | POA: Diagnosis not present

## 2021-05-04 DIAGNOSIS — M170 Bilateral primary osteoarthritis of knee: Secondary | ICD-10-CM | POA: Diagnosis not present

## 2021-05-04 DIAGNOSIS — Z022 Encounter for examination for admission to residential institution: Secondary | ICD-10-CM | POA: Diagnosis not present

## 2021-05-04 DIAGNOSIS — S51812A Laceration without foreign body of left forearm, initial encounter: Secondary | ICD-10-CM | POA: Diagnosis not present

## 2021-05-04 DIAGNOSIS — I5032 Chronic diastolic (congestive) heart failure: Secondary | ICD-10-CM | POA: Diagnosis not present

## 2021-05-04 NOTE — Telephone Encounter (Signed)
Spoke with patients daughter per release form. Reviewed provider recommendations and then inquired how frequent has she been taking. She reports anywhere from 1 to 3 times a day. We then discussed we should keep it the same for now and if she is having to take more frequent then we can get her rescheduled for further management. She was agreeable with this plan and had no further questions at this time.  ?

## 2021-05-04 NOTE — Telephone Encounter (Signed)
Pt c/o medication issue: ? ?1. Name of Medication: hydrALAZINE (APRESOLINE) 25 MG tablet ? ?2. How are you currently taking this medication (dosage and times per day)? 3 times a day as needed ? ?3. Are you having a reaction (difficulty breathing--STAT)? no ? ?4. What is your medication issue? Patient's daughter states at the patient;s last appointment they mentioned having to take the hydralazine 5 times a day and were told that is okay. She states the instructions need to be updated her for FL2.  ?

## 2021-05-04 NOTE — Telephone Encounter (Signed)
The max dose is 300 mg /day. I did not state she could take this 5 times daily. We did discuss a maximum dose of 300 mg. This is typically accomplished with tid dosing. She is on 25 mg tid prn. I do not recommend she take this medication 5 times daily. If she is taking it that frequently, we should titrate her dose to allow for less frequent dosing.  ?

## 2021-05-04 NOTE — Telephone Encounter (Signed)
Spoke with patients daughter per release form. They are trying to correct the [facility] because she is going into a facility. She states that at last appointment it was mentioned to them it was ok to dose up to 300 mg a day. So they are requesting instructions be updated to Hydralazine 5 times a day for elevated blood pressures. Reviewed that I would need provider to review and advise on these changes. She verbalized understanding with no further questions at this time.  ?

## 2021-05-12 DIAGNOSIS — H353211 Exudative age-related macular degeneration, right eye, with active choroidal neovascularization: Secondary | ICD-10-CM | POA: Diagnosis not present

## 2021-05-26 DIAGNOSIS — M170 Bilateral primary osteoarthritis of knee: Secondary | ICD-10-CM | POA: Diagnosis not present

## 2021-05-26 DIAGNOSIS — K219 Gastro-esophageal reflux disease without esophagitis: Secondary | ICD-10-CM | POA: Diagnosis not present

## 2021-05-26 DIAGNOSIS — G4733 Obstructive sleep apnea (adult) (pediatric): Secondary | ICD-10-CM | POA: Diagnosis not present

## 2021-05-26 DIAGNOSIS — I7121 Aneurysm of the ascending aorta, without rupture: Secondary | ICD-10-CM | POA: Diagnosis not present

## 2021-05-26 DIAGNOSIS — H3530 Unspecified macular degeneration: Secondary | ICD-10-CM | POA: Diagnosis not present

## 2021-05-26 DIAGNOSIS — I471 Supraventricular tachycardia: Secondary | ICD-10-CM | POA: Diagnosis not present

## 2021-05-26 DIAGNOSIS — M109 Gout, unspecified: Secondary | ICD-10-CM | POA: Diagnosis not present

## 2021-05-26 DIAGNOSIS — I480 Paroxysmal atrial fibrillation: Secondary | ICD-10-CM | POA: Diagnosis not present

## 2021-05-26 DIAGNOSIS — R42 Dizziness and giddiness: Secondary | ICD-10-CM | POA: Diagnosis not present

## 2021-05-26 DIAGNOSIS — N1832 Chronic kidney disease, stage 3b: Secondary | ICD-10-CM | POA: Diagnosis not present

## 2021-05-26 DIAGNOSIS — M069 Rheumatoid arthritis, unspecified: Secondary | ICD-10-CM | POA: Diagnosis not present

## 2021-05-26 DIAGNOSIS — E559 Vitamin D deficiency, unspecified: Secondary | ICD-10-CM | POA: Diagnosis not present

## 2021-05-26 DIAGNOSIS — E039 Hypothyroidism, unspecified: Secondary | ICD-10-CM | POA: Diagnosis not present

## 2021-05-26 DIAGNOSIS — I130 Hypertensive heart and chronic kidney disease with heart failure and stage 1 through stage 4 chronic kidney disease, or unspecified chronic kidney disease: Secondary | ICD-10-CM | POA: Diagnosis not present

## 2021-05-26 DIAGNOSIS — H6123 Impacted cerumen, bilateral: Secondary | ICD-10-CM | POA: Diagnosis not present

## 2021-05-26 DIAGNOSIS — G319 Degenerative disease of nervous system, unspecified: Secondary | ICD-10-CM | POA: Diagnosis not present

## 2021-05-26 DIAGNOSIS — Z9181 History of falling: Secondary | ICD-10-CM | POA: Diagnosis not present

## 2021-05-26 DIAGNOSIS — G629 Polyneuropathy, unspecified: Secondary | ICD-10-CM | POA: Diagnosis not present

## 2021-05-26 DIAGNOSIS — R32 Unspecified urinary incontinence: Secondary | ICD-10-CM | POA: Diagnosis not present

## 2021-05-26 DIAGNOSIS — E7800 Pure hypercholesterolemia, unspecified: Secondary | ICD-10-CM | POA: Diagnosis not present

## 2021-05-26 DIAGNOSIS — I350 Nonrheumatic aortic (valve) stenosis: Secondary | ICD-10-CM | POA: Diagnosis not present

## 2021-05-26 DIAGNOSIS — Z8673 Personal history of transient ischemic attack (TIA), and cerebral infarction without residual deficits: Secondary | ICD-10-CM | POA: Diagnosis not present

## 2021-05-26 DIAGNOSIS — M810 Age-related osteoporosis without current pathological fracture: Secondary | ICD-10-CM | POA: Diagnosis not present

## 2021-05-26 DIAGNOSIS — I5032 Chronic diastolic (congestive) heart failure: Secondary | ICD-10-CM | POA: Diagnosis not present

## 2021-05-27 ENCOUNTER — Other Ambulatory Visit: Payer: Self-pay

## 2021-05-27 DIAGNOSIS — R600 Localized edema: Secondary | ICD-10-CM | POA: Diagnosis not present

## 2021-05-27 DIAGNOSIS — I5032 Chronic diastolic (congestive) heart failure: Secondary | ICD-10-CM | POA: Diagnosis not present

## 2021-05-27 MED ORDER — FUROSEMIDE 40 MG PO TABS: 40.0000 mg | ORAL_TABLET | ORAL | 0 refills | Status: DC

## 2021-06-07 DIAGNOSIS — Z8673 Personal history of transient ischemic attack (TIA), and cerebral infarction without residual deficits: Secondary | ICD-10-CM | POA: Diagnosis not present

## 2021-06-07 DIAGNOSIS — G4733 Obstructive sleep apnea (adult) (pediatric): Secondary | ICD-10-CM | POA: Diagnosis not present

## 2021-06-07 DIAGNOSIS — K219 Gastro-esophageal reflux disease without esophagitis: Secondary | ICD-10-CM | POA: Diagnosis not present

## 2021-06-07 DIAGNOSIS — E7800 Pure hypercholesterolemia, unspecified: Secondary | ICD-10-CM | POA: Diagnosis not present

## 2021-06-07 DIAGNOSIS — H3530 Unspecified macular degeneration: Secondary | ICD-10-CM | POA: Diagnosis not present

## 2021-06-07 DIAGNOSIS — G629 Polyneuropathy, unspecified: Secondary | ICD-10-CM | POA: Diagnosis not present

## 2021-06-07 DIAGNOSIS — M170 Bilateral primary osteoarthritis of knee: Secondary | ICD-10-CM | POA: Diagnosis not present

## 2021-06-07 DIAGNOSIS — I471 Supraventricular tachycardia: Secondary | ICD-10-CM | POA: Diagnosis not present

## 2021-06-07 DIAGNOSIS — I5032 Chronic diastolic (congestive) heart failure: Secondary | ICD-10-CM | POA: Diagnosis not present

## 2021-06-07 DIAGNOSIS — Z9181 History of falling: Secondary | ICD-10-CM | POA: Diagnosis not present

## 2021-06-07 DIAGNOSIS — M069 Rheumatoid arthritis, unspecified: Secondary | ICD-10-CM | POA: Diagnosis not present

## 2021-06-07 DIAGNOSIS — I350 Nonrheumatic aortic (valve) stenosis: Secondary | ICD-10-CM | POA: Diagnosis not present

## 2021-06-07 DIAGNOSIS — R32 Unspecified urinary incontinence: Secondary | ICD-10-CM | POA: Diagnosis not present

## 2021-06-07 DIAGNOSIS — E559 Vitamin D deficiency, unspecified: Secondary | ICD-10-CM | POA: Diagnosis not present

## 2021-06-07 DIAGNOSIS — M109 Gout, unspecified: Secondary | ICD-10-CM | POA: Diagnosis not present

## 2021-06-07 DIAGNOSIS — M810 Age-related osteoporosis without current pathological fracture: Secondary | ICD-10-CM | POA: Diagnosis not present

## 2021-06-07 DIAGNOSIS — N1832 Chronic kidney disease, stage 3b: Secondary | ICD-10-CM | POA: Diagnosis not present

## 2021-06-07 DIAGNOSIS — I7121 Aneurysm of the ascending aorta, without rupture: Secondary | ICD-10-CM | POA: Diagnosis not present

## 2021-06-07 DIAGNOSIS — I480 Paroxysmal atrial fibrillation: Secondary | ICD-10-CM | POA: Diagnosis not present

## 2021-06-07 DIAGNOSIS — R42 Dizziness and giddiness: Secondary | ICD-10-CM | POA: Diagnosis not present

## 2021-06-07 DIAGNOSIS — I130 Hypertensive heart and chronic kidney disease with heart failure and stage 1 through stage 4 chronic kidney disease, or unspecified chronic kidney disease: Secondary | ICD-10-CM | POA: Diagnosis not present

## 2021-06-07 DIAGNOSIS — G319 Degenerative disease of nervous system, unspecified: Secondary | ICD-10-CM | POA: Diagnosis not present

## 2021-06-07 DIAGNOSIS — H6123 Impacted cerumen, bilateral: Secondary | ICD-10-CM | POA: Diagnosis not present

## 2021-06-07 DIAGNOSIS — E039 Hypothyroidism, unspecified: Secondary | ICD-10-CM | POA: Diagnosis not present

## 2021-06-09 DIAGNOSIS — H353211 Exudative age-related macular degeneration, right eye, with active choroidal neovascularization: Secondary | ICD-10-CM | POA: Diagnosis not present

## 2021-06-10 DIAGNOSIS — J4540 Moderate persistent asthma, uncomplicated: Secondary | ICD-10-CM | POA: Diagnosis not present

## 2021-06-10 DIAGNOSIS — I350 Nonrheumatic aortic (valve) stenosis: Secondary | ICD-10-CM | POA: Diagnosis not present

## 2021-06-10 DIAGNOSIS — I119 Hypertensive heart disease without heart failure: Secondary | ICD-10-CM | POA: Diagnosis not present

## 2021-06-10 DIAGNOSIS — Z7951 Long term (current) use of inhaled steroids: Secondary | ICD-10-CM | POA: Diagnosis not present

## 2021-06-10 DIAGNOSIS — J069 Acute upper respiratory infection, unspecified: Secondary | ICD-10-CM | POA: Diagnosis not present

## 2021-06-17 DIAGNOSIS — J069 Acute upper respiratory infection, unspecified: Secondary | ICD-10-CM | POA: Diagnosis not present

## 2021-06-17 NOTE — Progress Notes (Signed)
Cardiology Office Note    Date:  06/18/2021   ID:  ANGELO CAROLL, DOB 1925/06/27, MRN [id number]  PCP:  Leonides Sake, MD  Cardiologist:  Nelva Bush, MD  Electrophysiologist:  None   Chief Complaint: Follow-up  History of Present Illness:   Jessica Kaufman is a 88 y.o. female with history of PAF, HFpEF, labile hypertension mild to moderate aortic stenosis, syncope, PVCs, HLD, mechanical falls, and rheumatoid arthritis who presents for follow-up of hypertension.   Zio patch in 02/2019 showed a predominant rhythm of sinus with an average rate of 62 bpm (range 41 to 107 bpm in sinus), 39 episodes of paroxysmal SVT lasting up to 10 beats with a maximum rate of 184 bpm, rare PACs and PVCs, brief episodes of junctional rhythm occurred often in the setting of PACs and PVCs, patient triggered events corresponded to sinus rhythm, PACs, PVCs, and transient junctional rhythm.  Echo in 03/2019 showed an EF of 55 to 60%, no regional wall motion abnormalities, normal LV diastolic function parameters, normal RV systolic function and ventricular cavity size, trivial mitral regurgitation, moderate aortic stenosis, and an estimated right atrial pressure of 3 mmHg.  Echo in 05/2020 showed an EF of 50 to 55%, mild LVH, indeterminate LV diastolic function parameters, normal RV systolic function and ventricular cavity size, mild mitral regurgitation, moderate aortic stenosis, and an estimated right atrial pressure of 8 mmHg.  Most recent echo from 01/2021 demonstrated an EF of 60 to 65%, no regional wall motion abnormalities, mild LVH, grade 1 diastolic dysfunction, normal RV systolic function and ventricular cavity size, mild mitral regurgitation, and mild to moderate aortic valve stenosis with a mean gradient of 15 mmHg and a valve area of 1.58 cm.   She was seen in the office in 03/2021, at which time the patient and her daughter were concerned regarding recent spikes in blood pressure.  This coincided  with a refractory UTI, for which she was on her third course of antibiotic therapy.  BP had been labile ranging from 127/79 to 225/115 over the preceding 4 days.  She had taken several doses of as needed hydralazine.  Lower extremity swelling was also a little bit worse with associated 2 to 3 pound weight gain above baseline.  HCTZ was discontinued and furosemide was titrated to 40 mg daily.  She was otherwise continued on carvedilol and losartan, as well as as needed hydralazine.   She was seen in the ED on 03/19/2021 after sustaining a mechanical fall without syncope.  CT head showed no acute intracranial abnormality.   Our office was notified on 03/24/2021 that the patient was having increasing lower extremity swelling with BP readings in the 932I to 712W systolic.  Given this, her for Lasix was titrated to 40 mg twice daily.  With this, we were notified on 03/26/2021 that the patient felt weak and was having low blood pressures in the low 100s ranging to 580D systolic.  Lower extremity swelling had improved.  Lasix was decreased back to 40 mg daily.  She was last seen in the office on 04/15/2021 and was without symptoms of angina or decompensation.  Since that she had last been seen, she had been taking furosemide 40 mg daily with an extra 20 mg on an every other day basis.  With this, she had noted stabilization of her lower extremity swelling and some stabilization of her blood pressure readings.  Her Lasix was transitioned to 40 mg daily in the morning and  20 mg daily in the afternoon.  She comes in accompanied by her daughter today and is doing well from a cardiac perspective.  She is without symptoms of angina or decompensation.  She is getting over some bronchitis.  Blood pressures at home have overall been reasonably controlled and stable with readings in the 140s to 160s with a low reading of 641 systolic.  She has been taking hydralazine largely once per day between 3 and 4 PM.  In the context of  her bronchitis, she has noted some elevated BP readings, particularly last evening with readings of 583/094 and 076K systolic.  With a as needed hydralazine, blood pressure improved back to her prior baseline.  She has been without symptoms of dizziness, presyncope, or syncope.  Lower extremity swelling continues to improve.  No significant orthopnea.   Labs independently reviewed: 04/2021 - BUN 15, serum creatinine 0.61, potassium 4.6 03/2021 - albumin 4.3, AST/ALT normal, Hgb 13.5, PLT 317 05/2020 - magnesium 2.0, TSH normal, TC 199, TG 88, HDL 59, LDL 122, A1c 5.5  Past Medical History:  Diagnosis Date   Aortic valve disease    Mild-moderate aortic stenosis   Asthma    Cancer (HCC)    basal cell    Gout    Hyperlipidemia    Hypertension    Hypertensive heart disease without heart failure    Hypothyroidism    PAF (paroxysmal atrial fibrillation) (HCC)    PVC (premature ventricular contraction)    Rheumatoid arthritis ([place])    Ventricular premature depolarization     Past Surgical History:  Procedure Laterality Date   BLADDER SURGERY  10/19/2010   BONY PELVIS SURGERY  1990   BREAST BIOPSY Left 1986   CARDIAC SURGERY  2009   PVC   CATARACT EXTRACTION Left 11/15/00   CATARACT EXTRACTION Right    ELBOW SURGERY  06/1990   HIP SURGERY Left 05/28/09   HIP SURGERY Right 06/30/03   HYSTEROSCOPY  06/17/1986   vaginal    Current Medications: Current Meds  Medication Sig   acetaminophen (TYLENOL) 650 MG CR tablet Take 650 mg by mouth every 8 (eight) hours as needed for pain.   Albuterol Sulfate (PROAIR RESPICLICK) 088 (90 Base) MCG/ACT AEPB Inhale 2 puffs into the lungs every 6 (six) hours as needed.   allopurinol (ZYLOPRIM) 300 MG tablet Take 1 tablet by mouth every other day.    amoxicillin (AMOXIL) 500 MG tablet Take 4 tablets (2,000 mg total) by mouth 2 (two) times daily. For dental procedures only   apixaban (ELIQUIS) 5 MG TABS tablet Take 5 mg by mouth 2 (two) times daily.     b complex vitamins tablet Take 1 tablet by mouth daily.   benzonatate (TESSALON PERLES) 100 MG capsule Take 100 mg by mouth at bedtime.   Bilberry, Vaccinium myrtillus, 1000 MG CAPS Take 2 capsules by mouth daily.   Bioflavonoid Products (BIOFLEX) TABS Take 2 tablets by mouth daily.   carvedilol (COREG) 3.125 MG tablet Take 1 tablet (3.125 mg total) by mouth 2 (two) times daily with a meal.   Cholecalciferol (VITAMIN D3) 1000 UNITS CAPS Take 1 capsule by mouth daily.   ciprofloxacin (CIPRO) 500 MG tablet Take 500 mg by mouth 2 (two) times daily.   Coenzyme Q10 (COQ10) 100 MG CAPS Take 1 capsule by mouth daily.   CRANBERRY PO Take 1 tablet by mouth daily.   famotidine (PEPCID) 20 MG tablet Take 20 mg by mouth daily.   fexofenadine (ALLEGRA) 180  MG tablet Take 180 mg by mouth daily.   Fluticasone-Umeclidin-Vilant (TRELEGY ELLIPTA) 100-62.5-25 MCG/ACT AEPB Inhale 100 mcg into the lungs daily.   furosemide (LASIX) 40 MG tablet Take 1 tablet (40 mg total) by mouth as directed. Take 1 tablet in the AM (40 mg) and half a tablet (20 mg) in the PM   gabapentin (NEURONTIN) 600 MG tablet Take 600 mg by mouth 2 (two) times daily.   Ginger, Zingiber officinalis, (GINGER ROOT PO) Take by mouth daily.   guaiFENesin (MUCINEX) 600 MG 12 hr tablet Take 600 mg by mouth daily.   hydrALAZINE (APRESOLINE) 25 MG tablet TAKE 1 TABLET BY MOUTH ONCE A DAY AND TWICE A DAY AS NEEDED FOR SYSTOLIC BP OF 161 OR GREATER   levothyroxine (SYNTHROID, LEVOTHROID) 50 MCG tablet Take 1 tablet by mouth daily.   losartan (COZAAR) 50 MG tablet Take 1 tablet (50 mg total) by mouth 2 (two) times daily.   meclizine (ANTIVERT) 12.5 MG tablet Take 1 tablet (12.5 mg total) by mouth every 8 (eight) hours as needed for dizziness.   Multiple Vitamins-Minerals (CENTRUM SILVER PO) Take 1 tablet by mouth daily.   Multiple Vitamins-Minerals (PRESERVISION AREDS 2 PO) Take 1 capsule by mouth 2 (two) times daily.    Omega-3 Fatty Acids (FISH OIL)  1000 MG CAPS Take 2 capsules by mouth daily.   PREMARIN vaginal cream Place 1 Applicatorful vaginally in the morning and at bedtime.    psyllium (METAMUCIL) 58.6 % powder Take 1 packet by mouth daily.   Pyridoxine HCl (VITAMIN B6 PO) Take 1,000 mg by mouth daily.   TURMERIC PO Take by mouth daily.   [DISCONTINUED] hydrALAZINE (APRESOLINE) 25 MG tablet TAKE 1 TABLET BY MOUTH 3 TIMES A DAY AS NEEDED FOR SYSTOLIC BP OF 096 OR GREATER    Allergies:   Metronidazole, Sulindac, and Naproxen   Social History   Socioeconomic History   Marital status: Widowed    Spouse name: Not on file   Number of children: 7   Years of education: 12th   Highest education level: Not on file  Occupational History   Occupation: Retired  Tobacco Use   Smoking status: Never   Smokeless tobacco: Never  Vaping Use   Vaping Use: Never used  Substance and Sexual Activity   Alcohol use: No   Drug use: No   Sexual activity: Not on file  Other Topics Concern   Not on file  Social History Narrative   Patient lives at home alone.Caffeine Use: 1 cup daily 7 children (1 deceased)   Social Determinants of Health   Financial Resource Strain: Not on file  Food Insecurity: Not on file  Transportation Needs: Not on file  Physical Activity: Not on file  Stress: Not on file  Social Connections: Not on file     Family History:  The patient's family history includes Breast cancer in her daughter; Heart attack in her father and maternal grandmother; Heart failure in her sister; Melanoma in her mother and sister; Stroke in her mother; Uterine cancer in her sister.  ROS:   12-point review of systems is negative unless otherwise noted in the HPI.   EKGs/Labs/Other Studies Reviewed:    Studies reviewed were summarized above. The additional studies were reviewed today:  2D echo 02/01/2021: 1. Left ventricular ejection fraction, by estimation, is 60 to 65%. Left  ventricular ejection fraction by 2D MOD biplane is  61.2 %. The left  ventricle has normal function. The left ventricle has  no regional wall  motion abnormalities. There is mild left  ventricular hypertrophy. Left ventricular diastolic parameters are  consistent with Grade I diastolic dysfunction (impaired relaxation).   2. Right ventricular systolic function is normal. The right ventricular  size is normal.   3. The mitral valve is normal in structure. Mild mitral valve  regurgitation.   4. Mean aortic valve gradient 15 mmHg, peak gradient 22 mmHg, Vmax  1.56ms, AVA 1.58cm2.. The aortic valve is calcified. Aortic valve  regurgitation is not visualized. Mild to moderate aortic valve stenosis.   5. The inferior vena cava is normal in size with greater than 50%  respiratory variability, suggesting right atrial pressure of 3 mmHg.   Comparison(s): EF 55-60%; Moderate AS. __________   2D echo 05/08/2020: 1. Left ventricular ejection fraction, by estimation, is 50 to 55%. The  left ventricle has low normal function. Left ventricular endocardial  border not optimally defined to evaluate regional wall motion. There is  mild left ventricular hypertrophy. Left  ventricular diastolic parameters are indeterminate.   2. Right ventricular systolic function is normal. The right ventricular  size is normal.   3. The mitral valve is normal in structure. Mild mitral valve  regurgitation. No evidence of mitral stenosis.   4. The aortic valve was not well visualized. Aortic valve regurgitation  is not visualized. Moderate aortic valve stenosis. Aortic gradient does  not seem accurate. Aortic stenosis seems at least moderate based on valve  morphology and restricted opening.   5. The inferior vena cava is normal in size with <50% respiratory  variability, suggesting right atrial pressure of 8 mmHg. __________   2D echo 04/03/2019: 1. Left ventricular ejection fraction, by estimation, is 55 to 60%. The  left ventricle has normal function. The left  ventricle has no regional  wall motion abnormalities. Left ventricular diastolic parameters were  normal.   2. Right ventricular systolic function is normal. The right ventricular  size is normal.   3. The mitral valve is grossly normal. Trivial mitral valve  regurgitation.   4. The aortic valve was not well visualized. Aortic valve regurgitation  is not visualized. Moderate aortic valve stenosis.   5. The inferior vena cava is normal in size with greater than 50%  respiratory variability, suggesting right atrial pressure of 3 mmHg. __________   ZElwyn Reachpatch 02/2019: The patient was monitored for 13 days, 23 hours. The predominant rhythm was sinus with an average rate of 62 bpm (range 41 to 107 bpm in sinus). Rare PACs and PVCs occurred. There were 39 episodes of paroxysmal supraventricular tachycardia lasting up to 10 beats with a maximum rate of 184 bpm. Brief episodes of junctional rhythm occurred, often in the setting of PACs and PVCs. Patient triggered events correspond to sinus rhythm, PACs, and PVCs transient junctional rhythm.   Predominately sinus rhythm with rare PACs and PVCs.  39 episodes of brief PSVT occurred, as well as transient junctional rhythm (often in the setting of PACs and PVCs).   EKG:  EKG is ordered today.  The EKG ordered today demonstrates NSR, 60 bpm, baseline wandering, possible prior septal infarct versus lead placement, no acute ST-T changes  Recent Labs: 03/11/2021: ALT 19; Hemoglobin 13.5; Platelets 317 04/26/2021: BUN 15; Creatinine, Ser 0.61; Potassium 4.6; Sodium 139  Recent Lipid Panel    Component Value Date/Time   CHOL 199 05/08/2020 0522   TRIG 88 05/08/2020 0522   HDL 59 05/08/2020 0522   CHOLHDL 3.4 05/08/2020 0522  VLDL 18 05/08/2020 0522   LDLCALC 122 (H) 05/08/2020 0522    PHYSICAL EXAM:    VS:  BP (!) 152/70   Pulse 60   Ht '5\' 3"'$  (1.6 m)   Wt 218 lb 3.2 oz (99 kg)   SpO2 96%   BMI 38.65 kg/m   BMI: Body mass index is 38.65  kg/m.  Physical Exam Vitals reviewed.  Constitutional:      Appearance: She is well-developed.  HENT:     Head: Normocephalic and atraumatic.  Eyes:     General:        Right eye: No discharge.        Left eye: No discharge.  Neck:     Vascular: No JVD.  Cardiovascular:     Rate and Rhythm: Normal rate and regular rhythm.     Pulses:          Posterior tibial pulses are 2+ on the right side and 2+ on the left side.     Heart sounds: S1 normal and S2 normal. Heart sounds not distant. No midsystolic click and no opening snap. Murmur heard.     Harsh midsystolic murmur is present with a grade of 2/6 at the upper right sternal border radiating to the neck.     No friction rub.  Pulmonary:     Effort: Pulmonary effort is normal. No respiratory distress.     Breath sounds: Normal breath sounds. No decreased breath sounds, wheezing or rales.  Chest:     Chest wall: No tenderness.  Abdominal:     General: There is no distension.     Palpations: Abdomen is soft.  Musculoskeletal:     Cervical back: Normal range of motion.     Right lower leg: Edema present.     Left lower leg: Edema present.     Comments: Trivial bilateral pretibial edema noted with compression stockings in place.  Skin:    General: Skin is warm and dry.     Nails: There is no clubbing.  Neurological:     Mental Status: She is alert and oriented to person, place, and time.  Psychiatric:        Speech: Speech normal.        Behavior: Behavior normal.        Thought Content: Thought content normal.        Judgment: Judgment normal.     Wt Readings from Last 3 Encounters:  06/18/21 218 lb 3.2 oz (99 kg)  04/15/21 215 lb 6 oz (97.7 kg)  03/19/21 220 lb 7.4 oz (100 kg)     ASSESSMENT & PLAN:   Labile hypertension and dizziness: Overall, blood pressures have improved and stabilized with most readings in the 852D to 782U systolic.  She has been taking her as needed hydralazine on a daily basis around 3 to 4  PM.  Given this, we will have her go ahead and continue to take hydralazine 25 mg daily at 4 PM with recommendation to continue as needed dosing in the morning and evening hours.  She will otherwise continue carvedilol and losartan.  PAF: Maintaining sinus rhythm.  She remains on carvedilol and apixaban without symptoms concerning for bleeding.  Recent labs stable as outlined above.  HFpEF: She appears euvolemic and well compensated.  She remains on furosemide 40 mg in the morning and 20 mg in the afternoon.  Recent labs stable.  Continue with leg elevation and compression stockings.  Aortic stenosis: Stable on most  recent echo.  Monitor periodically.  History of syncope: No further episodes.    Disposition: F/u with Dr. Saunders Revel or an APP in 6 months.   Medication Adjustments/Labs and Tests Ordered: Current medicines are reviewed at length with the patient today.  Concerns regarding medicines are outlined above. Medication changes, Labs and Tests ordered today are summarized above and listed in the Patient Instructions accessible in Encounters.   Signed, Christell Faith, PA-C 06/18/2021 12:08 PM     [address], [place] [address] [phone]

## 2021-06-18 ENCOUNTER — Ambulatory Visit (INDEPENDENT_AMBULATORY_CARE_PROVIDER_SITE_OTHER): Payer: PPO | Admitting: Physician Assistant

## 2021-06-18 ENCOUNTER — Other Ambulatory Visit: Payer: Self-pay | Admitting: Physician Assistant

## 2021-06-18 ENCOUNTER — Other Ambulatory Visit: Payer: Self-pay

## 2021-06-18 ENCOUNTER — Encounter: Payer: Self-pay | Admitting: Physician Assistant

## 2021-06-18 VITALS — BP 152/70 | HR 60 | Ht 63.0 in | Wt 218.2 lb

## 2021-06-18 DIAGNOSIS — Z87898 Personal history of other specified conditions: Secondary | ICD-10-CM | POA: Diagnosis not present

## 2021-06-18 DIAGNOSIS — I480 Paroxysmal atrial fibrillation: Secondary | ICD-10-CM

## 2021-06-18 DIAGNOSIS — I5032 Chronic diastolic (congestive) heart failure: Secondary | ICD-10-CM

## 2021-06-18 DIAGNOSIS — R42 Dizziness and giddiness: Secondary | ICD-10-CM | POA: Diagnosis not present

## 2021-06-18 DIAGNOSIS — I350 Nonrheumatic aortic (valve) stenosis: Secondary | ICD-10-CM

## 2021-06-18 DIAGNOSIS — R0989 Other specified symptoms and signs involving the circulatory and respiratory systems: Secondary | ICD-10-CM

## 2021-06-18 MED ORDER — LOSARTAN POTASSIUM 50 MG PO TABS: 50.0000 mg | ORAL_TABLET | Freq: Two times a day (BID) | ORAL | 0 refills | Status: DC

## 2021-06-18 MED ORDER — HYDRALAZINE HCL 25 MG PO TABS: ORAL_TABLET | ORAL | 3 refills | Status: DC

## 2021-06-18 NOTE — Telephone Encounter (Signed)
Refill sent Losartan 50 mg

## 2021-06-18 NOTE — Patient Instructions (Signed)
Medication Instructions:  Your physician has recommended you make the following change in your medication:   CHANGE Hydralazine 25 mg once daily at 4 pm & Twice a day as needed for top blood pressure reading greater than 160  *If you need a refill on your cardiac medications before your next appointment, please call your pharmacy*   Lab Work: None  If you have labs (blood work) drawn today and your tests are completely normal, you will receive your results only by: [place] (if you have MyChart) OR A paper copy in the mail If you have any lab test that is abnormal or we need to change your treatment, we will call you to review the results.   Testing/Procedures: None   Follow-Up: At [facility], you and your health needs are our priority.  As part of our continuing mission to provide you with exceptional heart care, we have created designated Provider Care Teams.  These Care Teams include your primary Cardiologist (physician) and Advanced Practice Providers (APPs -  Physician Assistants and Nurse Practitioners) who all work together to provide you with the care you need, when you need it.   Your next appointment:   6 month(s)  The format for your next appointment:   In Person  Provider:   Nelva Bush, MD or Christell Faith, PA-C      Important Information About Sugar

## 2021-06-21 DIAGNOSIS — E559 Vitamin D deficiency, unspecified: Secondary | ICD-10-CM | POA: Diagnosis not present

## 2021-06-21 DIAGNOSIS — I480 Paroxysmal atrial fibrillation: Secondary | ICD-10-CM | POA: Diagnosis not present

## 2021-06-21 DIAGNOSIS — M170 Bilateral primary osteoarthritis of knee: Secondary | ICD-10-CM | POA: Diagnosis not present

## 2021-06-21 DIAGNOSIS — Z9181 History of falling: Secondary | ICD-10-CM | POA: Diagnosis not present

## 2021-06-21 DIAGNOSIS — R32 Unspecified urinary incontinence: Secondary | ICD-10-CM | POA: Diagnosis not present

## 2021-06-21 DIAGNOSIS — I350 Nonrheumatic aortic (valve) stenosis: Secondary | ICD-10-CM | POA: Diagnosis not present

## 2021-06-21 DIAGNOSIS — M810 Age-related osteoporosis without current pathological fracture: Secondary | ICD-10-CM | POA: Diagnosis not present

## 2021-06-21 DIAGNOSIS — E039 Hypothyroidism, unspecified: Secondary | ICD-10-CM | POA: Diagnosis not present

## 2021-06-21 DIAGNOSIS — E7800 Pure hypercholesterolemia, unspecified: Secondary | ICD-10-CM | POA: Diagnosis not present

## 2021-06-21 DIAGNOSIS — G629 Polyneuropathy, unspecified: Secondary | ICD-10-CM | POA: Diagnosis not present

## 2021-06-21 DIAGNOSIS — G4733 Obstructive sleep apnea (adult) (pediatric): Secondary | ICD-10-CM | POA: Diagnosis not present

## 2021-06-21 DIAGNOSIS — R42 Dizziness and giddiness: Secondary | ICD-10-CM | POA: Diagnosis not present

## 2021-06-21 DIAGNOSIS — G319 Degenerative disease of nervous system, unspecified: Secondary | ICD-10-CM | POA: Diagnosis not present

## 2021-06-21 DIAGNOSIS — N1832 Chronic kidney disease, stage 3b: Secondary | ICD-10-CM | POA: Diagnosis not present

## 2021-06-21 DIAGNOSIS — H6123 Impacted cerumen, bilateral: Secondary | ICD-10-CM | POA: Diagnosis not present

## 2021-06-21 DIAGNOSIS — M109 Gout, unspecified: Secondary | ICD-10-CM | POA: Diagnosis not present

## 2021-06-21 DIAGNOSIS — K219 Gastro-esophageal reflux disease without esophagitis: Secondary | ICD-10-CM | POA: Diagnosis not present

## 2021-06-21 DIAGNOSIS — M069 Rheumatoid arthritis, unspecified: Secondary | ICD-10-CM | POA: Diagnosis not present

## 2021-06-21 DIAGNOSIS — Z8673 Personal history of transient ischemic attack (TIA), and cerebral infarction without residual deficits: Secondary | ICD-10-CM | POA: Diagnosis not present

## 2021-06-21 DIAGNOSIS — H3530 Unspecified macular degeneration: Secondary | ICD-10-CM | POA: Diagnosis not present

## 2021-06-21 DIAGNOSIS — I5032 Chronic diastolic (congestive) heart failure: Secondary | ICD-10-CM | POA: Diagnosis not present

## 2021-06-21 DIAGNOSIS — I471 Supraventricular tachycardia: Secondary | ICD-10-CM | POA: Diagnosis not present

## 2021-06-21 DIAGNOSIS — I7121 Aneurysm of the ascending aorta, without rupture: Secondary | ICD-10-CM | POA: Diagnosis not present

## 2021-06-21 DIAGNOSIS — I130 Hypertensive heart and chronic kidney disease with heart failure and stage 1 through stage 4 chronic kidney disease, or unspecified chronic kidney disease: Secondary | ICD-10-CM | POA: Diagnosis not present

## 2021-06-27 ENCOUNTER — Emergency Department: Payer: PPO

## 2021-06-27 ENCOUNTER — Other Ambulatory Visit: Payer: Self-pay

## 2021-06-27 ENCOUNTER — Encounter: Payer: Self-pay | Admitting: Emergency Medicine

## 2021-06-27 ENCOUNTER — Emergency Department
Admission: EM | Admit: 2021-06-27 | Discharge: 2021-06-27 | Disposition: A | Discharge status: Home / Self Care | Payer: PPO | Attending: Emergency Medicine | Admitting: Emergency Medicine

## 2021-06-27 DIAGNOSIS — R200 Anesthesia of skin: Secondary | ICD-10-CM | POA: Insufficient documentation

## 2021-06-27 DIAGNOSIS — R29810 Facial weakness: Secondary | ICD-10-CM | POA: Diagnosis not present

## 2021-06-27 DIAGNOSIS — G510 Bell's palsy: Secondary | ICD-10-CM

## 2021-06-27 DIAGNOSIS — I509 Heart failure, unspecified: Secondary | ICD-10-CM | POA: Diagnosis not present

## 2021-06-27 DIAGNOSIS — I110 Hypertensive heart disease with heart failure: Secondary | ICD-10-CM | POA: Insufficient documentation

## 2021-06-27 DIAGNOSIS — I5032 Chronic diastolic (congestive) heart failure: Secondary | ICD-10-CM | POA: Diagnosis not present

## 2021-06-27 DIAGNOSIS — I10 Essential (primary) hypertension: Secondary | ICD-10-CM | POA: Diagnosis not present

## 2021-06-27 DIAGNOSIS — Z7901 Long term (current) use of anticoagulants: Secondary | ICD-10-CM | POA: Insufficient documentation

## 2021-06-27 DIAGNOSIS — R600 Localized edema: Secondary | ICD-10-CM | POA: Diagnosis not present

## 2021-06-27 LAB — CBC
HCT: 40.2 % (ref 36.0–46.0)
Hemoglobin: 13.1 g/dL (ref 12.0–15.0)
MCH: 30 pg (ref 26.0–34.0)
MCHC: 32.6 g/dL (ref 30.0–36.0)
MCV: 92.2 fL (ref 80.0–100.0)
Platelets: 418 10*3/uL — ABNORMAL HIGH (ref 150–400)
RBC: 4.36 MIL/uL (ref 3.87–5.11)
RDW: 14.4 % (ref 11.5–15.5)
WBC: 6.5 10*3/uL (ref 4.0–10.5)
nRBC: 0 % (ref 0.0–0.2)

## 2021-06-27 LAB — DIFFERENTIAL
Abs Immature Granulocytes: 0.01 10*3/uL (ref 0.00–0.07)
Basophils Absolute: 0.1 10*3/uL (ref 0.0–0.1)
Basophils Relative: 1 %
Eosinophils Absolute: 0.2 10*3/uL (ref 0.0–0.5)
Eosinophils Relative: 3 %
Immature Granulocytes: 0 %
Lymphocytes Relative: 31 %
Lymphs Abs: 2 10*3/uL (ref 0.7–4.0)
Monocytes Absolute: 0.8 10*3/uL (ref 0.1–1.0)
Monocytes Relative: 12 %
Neutro Abs: 3.5 10*3/uL (ref 1.7–7.7)
Neutrophils Relative %: 53 %

## 2021-06-27 LAB — COMPREHENSIVE METABOLIC PANEL
ALT: 17 U/L (ref 0–44)
AST: 28 U/L (ref 15–41)
Albumin: 3.9 g/dL (ref 3.5–5.0)
Alkaline Phosphatase: 85 U/L (ref 38–126)
Anion gap: 7 (ref 5–15)
BUN: 14 mg/dL (ref 8–23)
CO2: 27 mmol/L (ref 22–32)
Calcium: 9.5 mg/dL (ref 8.9–10.3)
Chloride: 102 mmol/L (ref 98–111)
Creatinine, Ser: 0.61 mg/dL (ref 0.44–1.00)
GFR, Estimated: >60 mL/min (ref >60)
Glucose, Bld: 87 mg/dL (ref 70–99)
Potassium: 4.1 mmol/L (ref 3.5–5.1)
Sodium: 136 mmol/L (ref 135–145)
Total Bilirubin: 0.7 mg/dL (ref 0.3–1.2)
Total Protein: 7.3 g/dL (ref 6.5–8.1)

## 2021-06-27 LAB — APTT: aPTT: 32 seconds (ref 24–36)

## 2021-06-27 LAB — PROTIME-INR
INR: 1.3 — ABNORMAL HIGH (ref 0.8–1.2)
Prothrombin Time: 15.9 seconds — ABNORMAL HIGH (ref 11.4–15.2)

## 2021-06-27 MED ORDER — SODIUM CHLORIDE 0.9% FLUSH: 3.0000 mL | Freq: Once | INTRAVENOUS | Status: DC

## 2021-06-27 MED ORDER — ERYTHROMYCIN 5 MG/GM OP OINT
1.0000 | TOPICAL_OINTMENT | Freq: Every day | OPHTHALMIC | 0 refills | Status: DC
Start: 2021-06-27 — End: 2021-08-31

## 2021-06-27 MED ORDER — PREDNISONE 20 MG PO TABS
60.0000 mg | ORAL_TABLET | Freq: Every day | ORAL | 0 refills | Status: AC
Start: 2021-06-27 — End: 2021-07-02

## 2021-06-27 NOTE — ED Provider Notes (Signed)
[facility] Provider Note    Event Date/Time   First MD Initiated Contact with Patient 06/27/21 [phone]     (approximate)   History   Chief Complaint Facial Droop   HPI  Jessica Kaufman is a 88 y.o. female with past medical history of hypertension, CHF, atrial fibrillation on Eliquis, and RA who presents to the ED complaining of facial droop.  Patient reports that she woke up this morning around 6:00 AM feeling normal, but when she went to take her usual medications around 730 she noticed that she seemed to be drooping on the right side of her face.  Family came to check on her around 54, when she began complaining of numbness and decreased coordination in her right hand.  She also reports onset of numbness to both of her legs, but denies any weakness in her legs.  She denies any associated vision or speech changes.  She states that she was feeling fine when she went to bed last night without any symptoms.  She has not had any recent fevers, cough, chest pain, shortness of breath, abdominal pain, dysuria, nausea, or vomiting.      Physical Exam   Triage Vital Signs: ED Triage Vitals  Enc Vitals Group     BP      Pulse      Resp      Temp      Temp src      SpO2      Weight      Height      Head Circumference      Peak Flow      Pain Score      Pain Loc      Pain Edu?      Excl. in GC?     Most recent vital signs: Vitals:   06/27/21 0958 06/27/21 1222  BP: (!) 146/69 (!) 175/78  Pulse: 71 65  Resp: 16 18  Temp: 98.3 F (36.8 C) 97.7 F (36.5 C)  SpO2: 96% 98%    Constitutional: Alert and oriented. Eyes: Conjunctivae are normal. Head: Atraumatic. Nose: No congestion/rhinnorhea. Mouth/Throat: Mucous membranes are moist.  Cardiovascular: Normal rate, regular rhythm. Grossly normal heart sounds.  2+ radial pulses bilaterally. Respiratory: Normal respiratory effort.  No retractions. Lungs CTAB. Gastrointestinal: Soft and nontender. No  distention. Musculoskeletal: No lower extremity tenderness nor edema.  Neurologic:  Normal speech and language.  Right-sided facial droop noted with involvement of the forehead.  5-5 strength of bilateral upper and lower extremities with no pronator drift.    ED Results / Procedures / Treatments   Labs (all labs ordered are listed, but only abnormal results are displayed) Labs Reviewed  PROTIME-INR - Abnormal; Notable for the following components:      Result Value   Prothrombin Time 15.9 (*)    INR 1.3 (*)    All other components within normal limits  CBC - Abnormal; Notable for the following components:   Platelets 418 (*)    All other components within normal limits  APTT  DIFFERENTIAL  COMPREHENSIVE METABOLIC PANEL  URINALYSIS, ROUTINE W REFLEX MICROSCOPIC  CBG MONITORING, ED     EKG  ED ECG REPORT I, Chesley Noon, the attending physician, personally viewed and interpreted this ECG.   Date: 06/27/2021  EKG Time: 10:03  Rate: 79  Rhythm: normal sinus rhythm  Axis: Normal  Intervals:none  ST&T Change: None  RADIOLOGY CT head reviewed and interpreted by me with no  hemorrhage or midline shift.  PROCEDURES:  Critical Care performed: No  Procedures   MEDICATIONS ORDERED IN ED: Medications  sodium chloride flush (NS) 0.9 % injection 3 mL ( Intravenous Canceled Entry 06/27/21 1009)     IMPRESSION / MDM / ASSESSMENT AND PLAN / ED COURSE  I reviewed the triage vital signs and the nursing notes.                              88 y.o. female with past medical history of hypertension, atrial fibrillation on Eliquis, CHF, and rheumatoid arthritis who presents to the ED complaining of right-sided facial droop noted this morning along with right hand numbness and bilateral leg numbness.  Patient's presentation is most consistent with acute presentation with potential threat to life or bodily function.  Differential diagnosis includes, but is not limited to, stroke,  TIA, electrolyte abnormality, UTI, Bell's palsy.  Patient well-appearing and in no acute distress, vital signs are unremarkable.  She does have facial droop noted on the right side of her face, however this involves her forehead and seems clinically consistent with a Bell's palsy.  She reports some numbness in her right hand as well as her bilateral lower extremities, however no objective weakness noted on exam.  She is already anticoagulated and given symptoms seem likely to have been present when she woke up this morning, is not a candidate for tPA.  No findings to suggest large vessel occlusion at this time.  Case discussed with Dr. Amada Jupiter of neurology, who agrees with plan to work-up for possible stroke with CT head and potential MRI, but if this is unremarkable suspect Bell's palsy.  CT head is negative for acute process, MRI performed and is also known negative for stroke or other acute process.  Patient symptoms are consistent with a Bell's palsy and she is appropriate for discharge home with PCP and neurology follow-up.  We will start her on a course of steroids, also provided with antibiotic ointment to use in her right eye at night.  She was counseled to return to the ED for new worsening symptoms, patient and family agree with plan.      FINAL CLINICAL IMPRESSION(S) / ED DIAGNOSES   Final diagnoses:  Bell's palsy     Rx / DC Orders   ED Discharge Orders          Ordered    predniSONE (DELTASONE) 20 MG tablet  Daily with breakfast        06/27/21 1448    erythromycin ophthalmic ointment  Daily at bedtime        06/27/21 1449             Note:  This document was prepared using Dragon voice recognition software and may include unintentional dictation errors.   Chesley Noon, MD 06/27/21 1451

## 2021-06-27 NOTE — ED Notes (Addendum)
First nurse note: pt comes with left sided facial droop that pt noticed when she woke up this morning. Pt unable to raise that eyebrow and left eye has been watering more than normal.

## 2021-06-28 DIAGNOSIS — G319 Degenerative disease of nervous system, unspecified: Secondary | ICD-10-CM | POA: Diagnosis not present

## 2021-06-28 DIAGNOSIS — I7121 Aneurysm of the ascending aorta, without rupture: Secondary | ICD-10-CM | POA: Diagnosis not present

## 2021-06-28 DIAGNOSIS — M810 Age-related osteoporosis without current pathological fracture: Secondary | ICD-10-CM | POA: Diagnosis not present

## 2021-06-28 DIAGNOSIS — I471 Supraventricular tachycardia: Secondary | ICD-10-CM | POA: Diagnosis not present

## 2021-06-28 DIAGNOSIS — I480 Paroxysmal atrial fibrillation: Secondary | ICD-10-CM | POA: Diagnosis not present

## 2021-06-28 DIAGNOSIS — R32 Unspecified urinary incontinence: Secondary | ICD-10-CM | POA: Diagnosis not present

## 2021-06-28 DIAGNOSIS — Z9181 History of falling: Secondary | ICD-10-CM | POA: Diagnosis not present

## 2021-06-28 DIAGNOSIS — G4733 Obstructive sleep apnea (adult) (pediatric): Secondary | ICD-10-CM | POA: Diagnosis not present

## 2021-06-28 DIAGNOSIS — M109 Gout, unspecified: Secondary | ICD-10-CM | POA: Diagnosis not present

## 2021-06-28 DIAGNOSIS — E039 Hypothyroidism, unspecified: Secondary | ICD-10-CM | POA: Diagnosis not present

## 2021-06-28 DIAGNOSIS — E559 Vitamin D deficiency, unspecified: Secondary | ICD-10-CM | POA: Diagnosis not present

## 2021-06-28 DIAGNOSIS — Z8673 Personal history of transient ischemic attack (TIA), and cerebral infarction without residual deficits: Secondary | ICD-10-CM | POA: Diagnosis not present

## 2021-06-28 DIAGNOSIS — I130 Hypertensive heart and chronic kidney disease with heart failure and stage 1 through stage 4 chronic kidney disease, or unspecified chronic kidney disease: Secondary | ICD-10-CM | POA: Diagnosis not present

## 2021-06-28 DIAGNOSIS — G629 Polyneuropathy, unspecified: Secondary | ICD-10-CM | POA: Diagnosis not present

## 2021-06-28 DIAGNOSIS — H6123 Impacted cerumen, bilateral: Secondary | ICD-10-CM | POA: Diagnosis not present

## 2021-06-28 DIAGNOSIS — I350 Nonrheumatic aortic (valve) stenosis: Secondary | ICD-10-CM | POA: Diagnosis not present

## 2021-06-28 DIAGNOSIS — M170 Bilateral primary osteoarthritis of knee: Secondary | ICD-10-CM | POA: Diagnosis not present

## 2021-06-28 DIAGNOSIS — I5032 Chronic diastolic (congestive) heart failure: Secondary | ICD-10-CM | POA: Diagnosis not present

## 2021-06-28 DIAGNOSIS — E7800 Pure hypercholesterolemia, unspecified: Secondary | ICD-10-CM | POA: Diagnosis not present

## 2021-06-28 DIAGNOSIS — M069 Rheumatoid arthritis, unspecified: Secondary | ICD-10-CM | POA: Diagnosis not present

## 2021-06-28 DIAGNOSIS — H3530 Unspecified macular degeneration: Secondary | ICD-10-CM | POA: Diagnosis not present

## 2021-06-28 DIAGNOSIS — K219 Gastro-esophageal reflux disease without esophagitis: Secondary | ICD-10-CM | POA: Diagnosis not present

## 2021-06-28 DIAGNOSIS — N1832 Chronic kidney disease, stage 3b: Secondary | ICD-10-CM | POA: Diagnosis not present

## 2021-06-28 DIAGNOSIS — R42 Dizziness and giddiness: Secondary | ICD-10-CM | POA: Diagnosis not present

## 2021-07-04 ENCOUNTER — Observation Stay
Admission: EM | Admit: 2021-07-04 | Discharge: 2021-07-04 | Disposition: A | Discharge status: Home / Self Care | Payer: PPO | Attending: Emergency Medicine | Admitting: Emergency Medicine

## 2021-07-04 ENCOUNTER — Other Ambulatory Visit: Payer: Self-pay

## 2021-07-04 ENCOUNTER — Observation Stay: Payer: PPO

## 2021-07-04 ENCOUNTER — Emergency Department: Payer: PPO

## 2021-07-04 DIAGNOSIS — I959 Hypotension, unspecified: Secondary | ICD-10-CM | POA: Diagnosis not present

## 2021-07-04 DIAGNOSIS — R4020 Unspecified coma: Secondary | ICD-10-CM | POA: Diagnosis not present

## 2021-07-04 DIAGNOSIS — Z7901 Long term (current) use of anticoagulants: Secondary | ICD-10-CM | POA: Insufficient documentation

## 2021-07-04 DIAGNOSIS — Z85828 Personal history of other malignant neoplasm of skin: Secondary | ICD-10-CM | POA: Insufficient documentation

## 2021-07-04 DIAGNOSIS — I10 Essential (primary) hypertension: Secondary | ICD-10-CM | POA: Diagnosis present

## 2021-07-04 DIAGNOSIS — I4891 Unspecified atrial fibrillation: Secondary | ICD-10-CM

## 2021-07-04 DIAGNOSIS — M109 Gout, unspecified: Secondary | ICD-10-CM | POA: Diagnosis present

## 2021-07-04 DIAGNOSIS — I480 Paroxysmal atrial fibrillation: Principal | ICD-10-CM | POA: Insufficient documentation

## 2021-07-04 DIAGNOSIS — E039 Hypothyroidism, unspecified: Secondary | ICD-10-CM | POA: Insufficient documentation

## 2021-07-04 DIAGNOSIS — I110 Hypertensive heart disease with heart failure: Secondary | ICD-10-CM | POA: Diagnosis not present

## 2021-07-04 DIAGNOSIS — J45909 Unspecified asthma, uncomplicated: Secondary | ICD-10-CM | POA: Diagnosis not present

## 2021-07-04 DIAGNOSIS — R55 Syncope and collapse: Secondary | ICD-10-CM | POA: Diagnosis not present

## 2021-07-04 DIAGNOSIS — R531 Weakness: Secondary | ICD-10-CM | POA: Diagnosis not present

## 2021-07-04 DIAGNOSIS — I5032 Chronic diastolic (congestive) heart failure: Secondary | ICD-10-CM | POA: Diagnosis not present

## 2021-07-04 DIAGNOSIS — R29810 Facial weakness: Secondary | ICD-10-CM | POA: Diagnosis not present

## 2021-07-04 DIAGNOSIS — R000 Tachycardia, unspecified: Secondary | ICD-10-CM | POA: Diagnosis not present

## 2021-07-04 LAB — COMPREHENSIVE METABOLIC PANEL
ALT: 18 U/L (ref 0–44)
AST: 24 U/L (ref 15–41)
Albumin: 3.3 g/dL — ABNORMAL LOW (ref 3.5–5.0)
Alkaline Phosphatase: 67 U/L (ref 38–126)
Anion gap: 10 (ref 5–15)
BUN: 19 mg/dL (ref 8–23)
CO2: 26 mmol/L (ref 22–32)
Calcium: 8.7 mg/dL — ABNORMAL LOW (ref 8.9–10.3)
Chloride: 101 mmol/L (ref 98–111)
Creatinine, Ser: 0.76 mg/dL (ref 0.44–1.00)
GFR, Estimated: >60 mL/min (ref >60)
Glucose, Bld: 131 mg/dL — ABNORMAL HIGH (ref 70–99)
Potassium: 3.6 mmol/L (ref 3.5–5.1)
Sodium: 137 mmol/L (ref 135–145)
Total Bilirubin: 0.4 mg/dL (ref 0.3–1.2)
Total Protein: 6.1 g/dL — ABNORMAL LOW (ref 6.5–8.1)

## 2021-07-04 LAB — CBC WITH DIFFERENTIAL/PLATELET
Abs Immature Granulocytes: 0.02 10*3/uL (ref 0.00–0.07)
Basophils Absolute: 0.1 10*3/uL (ref 0.0–0.1)
Basophils Relative: 1 %
Eosinophils Absolute: 0.3 10*3/uL (ref 0.0–0.5)
Eosinophils Relative: 4 %
HCT: 39.3 % (ref 36.0–46.0)
Hemoglobin: 12.8 g/dL (ref 12.0–15.0)
Immature Granulocytes: 0 %
Lymphocytes Relative: 22 %
Lymphs Abs: 1.4 10*3/uL (ref 0.7–4.0)
MCH: 30.4 pg (ref 26.0–34.0)
MCHC: 32.6 g/dL (ref 30.0–36.0)
MCV: 93.3 fL (ref 80.0–100.0)
Monocytes Absolute: 0.9 10*3/uL (ref 0.1–1.0)
Monocytes Relative: 13 %
Neutro Abs: 3.9 10*3/uL (ref 1.7–7.7)
Neutrophils Relative %: 60 %
Platelets: 325 10*3/uL (ref 150–400)
RBC: 4.21 MIL/uL (ref 3.87–5.11)
RDW: 14.7 % (ref 11.5–15.5)
WBC: 6.6 10*3/uL (ref 4.0–10.5)
nRBC: 0 % (ref 0.0–0.2)

## 2021-07-04 LAB — URINALYSIS, ROUTINE W REFLEX MICROSCOPIC
Bilirubin Urine: NEGATIVE
Glucose, UA: NEGATIVE mg/dL
Hgb urine dipstick: NEGATIVE
Ketones, ur: NEGATIVE mg/dL
Nitrite: POSITIVE — AB
Protein, ur: NEGATIVE mg/dL
Specific Gravity, Urine: 1.005 (ref 1.005–1.030)
Squamous Epithelial / HPF: NONE SEEN (ref 0–5)
pH: 7 (ref 5.0–8.0)

## 2021-07-04 MED ORDER — HYDRALAZINE HCL 20 MG/ML IJ SOLN
5.0000 mg | INTRAMUSCULAR | Status: DC | PRN
Start: 2021-07-04 — End: 2021-07-04
  Filled 2021-07-04: qty 0.25

## 2021-07-04 MED ORDER — SODIUM CHLORIDE 0.9 % IV BOLUS
1000.0000 mL | Freq: Once | INTRAVENOUS | Status: AC
Start: 2021-07-04 — End: 2021-07-04
  Administered 2021-07-04: 1000 mL via INTRAVENOUS

## 2021-07-04 MED ORDER — ALBUTEROL SULFATE HFA 108 (90 BASE) MCG/ACT IN AERS: 2.0000 | INHALATION_SPRAY | RESPIRATORY_TRACT | Status: DC | PRN

## 2021-07-04 MED ORDER — DILTIAZEM HCL-DEXTROSE 125-5 MG/125ML-% IV SOLN (PREMIX)
5.0000 mg/h | INTRAVENOUS | Status: DC
  Administered 2021-07-04: 5 mg/h via INTRAVENOUS
  Filled 2021-07-04: qty 125

## 2021-07-04 MED ORDER — POTASSIUM CHLORIDE CRYS ER 20 MEQ PO TBCR: 40.0000 meq | EXTENDED_RELEASE_TABLET | Freq: Once | ORAL | Status: DC

## 2021-07-04 MED ORDER — ONDANSETRON HCL 4 MG/2ML IJ SOLN: 4.0000 mg | Freq: Three times a day (TID) | INTRAMUSCULAR | Status: DC | PRN

## 2021-07-04 MED ORDER — ACETAMINOPHEN 325 MG PO TABS: 650.0000 mg | ORAL_TABLET | Freq: Four times a day (QID) | ORAL | Status: DC | PRN

## 2021-07-04 NOTE — ED Provider Notes (Signed)
[facility] Provider Note   Event Date/Time   First MD Initiated Contact with Patient 07/04/21 [phone]     (approximate) History  Loss of Consciousness  HPI Jessica Kaufman is a 88 y.o. female with a stated past medical history of labile hypertension, paroxysmal atrial fibrillation, and CHF who presents after an episode of loss of consciousness at her nursing home today.  Patient states that she was feeling generalized weakness prior to this event and states that this is happened before to her due to her blood pressure.  Patient states that when she awoke she did note some palpitations.  EMS found patient to be in atrial fibrillation with rapid ventricular response.  No medications given in route ROS: Patient currently denies any vision changes, tinnitus, difficulty speaking, facial droop, sore throat, chest pain, shortness of breath, abdominal pain, nausea/vomiting/diarrhea, dysuria, or weakness/numbness/paresthesias in any extremity   Physical Exam  Triage Vital Signs: ED Triage Vitals [07/04/21 0924]  Enc Vitals Group     BP 122/79     Pulse Rate 91     Resp 18     Temp 98.2 F (36.8 C)     Temp Source Oral     SpO2 94 %     Weight 213 lb (96.6 kg)     Height '5\' 3"'$  (1.6 m)     Head Circumference      Peak Flow      Pain Score 0     Pain Loc      Pain Edu?      Excl. in [place]?    Most recent vital signs: Vitals:   07/04/21 1040 07/04/21 1115  BP:  117/84  Pulse: (!) 124 (!) 121  Resp: 16 18  Temp:    SpO2:     General: Awake, oriented x4. CV:  Good peripheral perfusion.  Pulse irregularly irregular Resp:  Normal effort.  Abd:  No distention.  Other:  Elderly Caucasian female laying in bed in no acute distress ED Results / Procedures / Treatments  Labs (all labs ordered are listed, but only abnormal results are displayed) Labs Reviewed  COMPREHENSIVE METABOLIC PANEL - Abnormal; Notable for the following components:      Result Value    Glucose, Bld 131 (*)    Calcium 8.7 (*)    Total Protein 6.1 (*)    Albumin 3.3 (*)    All other components within normal limits  URINALYSIS, ROUTINE W REFLEX MICROSCOPIC - Abnormal; Notable for the following components:   Color, Urine YELLOW (*)    APPearance HAZY (*)    Nitrite POSITIVE (*)    Leukocytes,Ua SMALL (*)    Bacteria, UA FEW (*)    All other components within normal limits  CBC WITH DIFFERENTIAL/PLATELET  BRAIN NATRIURETIC PEPTIDE  MAGNESIUM   EKG ED ECG REPORT I, Naaman Plummer, the attending physician, personally viewed and interpreted this ECG. Date: 07/04/2021 EKG Time: 0927 Rate: 131 Rhythm: Atrial fibrillation with rapid ventricular response QRS Axis: normal Intervals: normal ST/T Wave abnormalities: normal Narrative Interpretation: Atrial fibrillation with rapid ventricular response.  No evidence of acute ischemia RADIOLOGY ED MD interpretation: One-view portable chest x-ray interpreted by me shows no evidence of acute abnormalities including no pneumonia, pneumothorax, or widened mediastinum -Agree with radiology assessment Official radiology report(s): DG Chest Port 1 View  Result Date: 07/04/2021 CLINICAL DATA:  Syncopal episode.  Atrial fibrillation. EXAM: PORTABLE CHEST 1 VIEW COMPARISON:  Radiographs 05/07/2020 and 12/19/2019.  CT 05/07/2020. FINDINGS: 0944 hours. The heart size and mediastinal contours are stable with aortic atherosclerosis. Chronic interstitial prominence at the lung bases appears similar to prior studies. No edema, focal airspace disease, pneumothorax or significant pleural effusion. No acute osseous findings are evident. Telemetry leads overlie the chest. IMPRESSION: Stable cardiomegaly and bibasilar atelectasis or scarring. No acute cardiopulmonary process. Electronically Signed   By: Richardean Sale M.D.   On: 07/04/2021 10:14   PROCEDURES: Critical Care performed: Yes, see critical care procedure note(s) .1-3 Lead EKG  Interpretation  Performed by: Naaman Plummer, MD Authorized by: Naaman Plummer, MD     Interpretation: abnormal     ECG rate:  121   ECG rate assessment: tachycardic     Rhythm: atrial fibrillation     Ectopy: none     Conduction: normal   CRITICAL CARE Performed by: Naaman Plummer  Total critical care time: 41 minutes  Critical care time was exclusive of separately billable procedures and treating other patients.  Critical care was necessary to treat or prevent imminent or life-threatening deterioration.  Critical care was time spent personally by me on the following activities: development of treatment plan with patient and/or surrogate as well as nursing, discussions with consultants, evaluation of patient's response to treatment, examination of patient, obtaining history from patient or surrogate, ordering and performing treatments and interventions, ordering and review of laboratory studies, ordering and review of radiographic studies, pulse oximetry and re-evaluation of patient's condition.  MEDICATIONS ORDERED IN ED: Medications  diltiazem (CARDIZEM) 125 mg in dextrose 5% 125 mL (1 mg/mL) infusion (10 mg/hr Intravenous Rate/Dose Change 07/04/21 1117)  albuterol (VENTOLIN HFA) 108 (90 Base) MCG/ACT inhaler 2 puff (has no administration in time range)  ondansetron (ZOFRAN) injection 4 mg (has no administration in time range)  acetaminophen (TYLENOL) tablet 650 mg (has no administration in time range)  hydrALAZINE (APRESOLINE) injection 5 mg (has no administration in time range)  potassium chloride SA (KLOR-CON M) CR tablet 40 mEq (has no administration in time range)  sodium chloride 0.9 % bolus 1,000 mL (1,000 mLs Intravenous New Bag/Given 07/04/21 0953)   IMPRESSION / MDM / ASSESSMENT AND PLAN / ED COURSE  I reviewed the triage vital signs and the nursing notes.                             The patient is on the cardiac monitor to evaluate for evidence of arrhythmia and/or  significant heart rate changes. Patient's presentation is most consistent with severe exacerbation of chronic illness. + atrial fibrillation w/ RVR DDx: Pneumothorax, Pneumonia, Pulmonary Embolus, Tamponade, ACS, Thyrotoxicosis.  No history or evidence decompensated heart failure. Given their history and exam it is likely this patient is unlikely to spontaneously revert to a rate controlled rhythm and necessitates a thorough workup for their arrhythmia. Workup: ECG, CXR, CBC, BMP, UA, Troponin, BNP, TSH, Ca-Mag-Phos Interventions: Defer Cardioversion (uncertain historical reliability with time of onset, increased risk of thromboembolic stroke).  Start diltiazem bolus and drip Consults: Spoke to Dr. Harrell Gave in cardiology who recommended continuing Cardizem drip as well as possible cardioversion if patient has been up-to-date on her anticoagulation.  Recommends no change to outpatient rate control if patient returns to normal sinus rhythm -Unfortunately timeline on patient's anticoagulation administration could not be established given incomplete MAR from patient's facility Spoke to Dr. Blaine Hamper who graciously agreed to except this patient onto the hospitalist service for further evaluation  and management. Disposition: Admit   FINAL CLINICAL IMPRESSION(S) / ED DIAGNOSES   Final diagnoses:  Atrial fibrillation with rapid ventricular response (HCC)  Syncope and collapse  Generalized weakness   Rx / DC Orders   ED Discharge Orders     None      Note:  This document was prepared using Dragon voice recognition software and may include unintentional dictation errors.   Naaman Plummer, MD 07/04/21 1134

## 2021-07-04 NOTE — ED Notes (Signed)
Pt in bed, pt states that she is feeling better and would like to go home. Pt denies chest pain.  Family at bedside, pt and pt's family verbalized understanding d/c and follow up.  Pt states that she is at assisted living at [facility], attempted to call [facility] three times with no answer, phone number [phone].  Pt from dpt with family

## 2021-07-04 NOTE — Assessment & Plan Note (Addendum)
Possibly due to atrial fibrillation with RVR.  Patient had hypotension which is also likely due to A-fib with RVR.  Patient has right facial droop which is due to recent Bell's palsy, otherwise no focal neurologic findings on physical examination.  Low suspicions for stroke.  Patient was started on Cardizem drip for atrial fibrillation with RVR, the repeated EKG showed convertion to sinus rhythm.  Heart rate in 70s.  Blood pressure 107/92 currently.  Patient is asymptomatic currently. ED physician consulted cardiologist, Dr. Harrell Gave, who agreed that pt can be discharged back to the facility.  -Will get CT of the head since patient is on Eliquis and not sure if patient injured her head --> negative for acute issues -Check orthostatic vital sign -IV fluid: Received 1 L normal saline -Frequent neurochecks

## 2021-07-04 NOTE — Assessment & Plan Note (Signed)
Currently blood pressure 107/92.  Patient is on Coreg, as needed hydralazine, Cozaar and Lasix. -Follow-up cardiologist recommendation about blood pressure medication adjustment.

## 2021-07-04 NOTE — ED Triage Notes (Signed)
[facility] reports pt coming from [facility] assisted [facility] for passing out at the breakfast table. Upon EMS arrival pt BP 68/36. Pt denies any chest pain or sob.

## 2021-07-04 NOTE — ED Notes (Signed)
Pt in bed having meal tray, family at bedside, pt sinus rhythm on monitor.

## 2021-07-04 NOTE — Assessment & Plan Note (Signed)
2D echo 02/01/2021 showed EF of 60 to 65% with grade 1 diastolic dysfunction.  Patient has trace leg edema, no shortness of breath.  Chest x-ray is negative for pulmonary edema.  CHF seem to be compensated. -Continue home Lasix

## 2021-07-04 NOTE — Assessment & Plan Note (Signed)
Synthroid 

## 2021-07-04 NOTE — Consult Note (Signed)
Medical Consultation   Jessica Kaufman  IHK:[id number]  DOB: 1925/11/08  DOA: 07/04/2021  PCP: Leonides Sake, MD   Outpatient Specialists:    Requesting physician: Dr. Cheri Fowler of ED  Reason for consultation: -syncope and A fib with RVR   History of Present Illness: Jessica Kaufman is an 88 y.o. female with PMH of hyperlipidemia, asthma, hypothyroidism, gout, rheumatoid arthritis, PAF on Eliquis, PVC, aortic valve stenosis, recent Bell's palsy with right facial droop, who presents with syncope.  Per patient's son and daughter at the bedside, patient passed out at breakfast table today.  Patient does not know how long she passed out.  He denies any injury.  Per report, patient had some palpitation feeling, currently denies palpitation or heart racing to me.  Denies chest pain or shortness of breath.  She has mild dry cough.  No fever or chills.  Denies nausea, vomiting, diarrhea or abdominal pain.  No symptoms of UTI.  She does not have unilateral numbness or tinglings in extremities, no slurred speech.  Patient has right facial droop due to recent Bell's palsy.  Per report, patient had blood pressure 68/36, which improved to 107/92 after given 1 L normal saline in the ED.  Patient was found to have atrial fibrillation with RVR, heart rate is 131 on EKG.  Patient was started on Cardizem drip in ED.  She is converted to sinus rhythm with heart rate 72 when I saw patient in ED.  Date reviewed, lab, image and vitals: WBC 6.6, electrolytes renal function okay, temperature normal, RR 18, oxygen saturation 92-98% on 2 L oxygen.  Chest x-ray negative.  Dr. Sharene Skeans of cardiology is consulted by EDP.  EKG: I reviewed EKG independently.  Atrial fibrillation, heart rate 131, QTc 488, ST depression diffusely.  The repeated EKG showed sinus rhythm.  Review of Systems:   General: no fevers, chills, no changes in body weight, no changes in appetite Skin: no rash HEENT: no  blurry vision, hearing changes or sore throat Pulm: no dyspnea, has coughing, no wheezing CV: no chest pain, had palpitations, no shortness of breath Abd: no nausea/vomiting, abdominal pain, diarrhea/constipation GU: no dysuria, hematuria, polyuria Ext: no arthralgias, myalgias. Has leg edema Neuro: no numbness, or tingling in extremities.  Has right facial droop.    Past Medical History: Past Medical History:  Diagnosis Date   Aortic valve disease    Mild-moderate aortic stenosis   Asthma    Cancer ([place])    basal cell    Gout    Hyperlipidemia    Hypertension    Hypertensive heart disease without heart failure    Hypothyroidism    PAF (paroxysmal atrial fibrillation) ([place])    PVC (premature ventricular contraction)    Rheumatoid arthritis ([place])    Ventricular premature depolarization     Past Surgical History: Past Surgical History:  Procedure Laterality Date   BLADDER SURGERY  10/19/2010   BONY PELVIS SURGERY  1990   BREAST BIOPSY Left 1986   CARDIAC SURGERY  2009   PVC   CATARACT EXTRACTION Left 11/15/00   CATARACT EXTRACTION Right    ELBOW SURGERY  06/1990   HIP SURGERY Left 05/28/09   HIP SURGERY Right 06/30/03   HYSTEROSCOPY  06/17/1986   vaginal     Allergies:   Allergies  Allergen Reactions   Metronidazole Other (See Comments)    RASH   Sulindac Other (See  Comments)    UNKNOWN   Naproxen Rash     Social History:  reports that she has never smoked. She has never used smokeless tobacco. She reports that she does not drink alcohol and does not use drugs.  Family History: Family History  Problem Relation Age of Onset   Stroke Mother    Melanoma Mother    Heart attack Father    Uterine cancer Sister    Melanoma Sister    Heart failure Sister    Heart attack Maternal Grandmother    Breast cancer Daughter      Physical Exam: Vitals:   07/04/21 1030 07/04/21 1040 07/04/21 1115 07/04/21 1215  BP: (!) 107/92  117/84 (!) 144/71  Pulse: 64 (!)  124 (!) 121 (!) 55  Resp: '12 16 18 17  '$ Temp:      TempSrc:      SpO2: 98%   97%  Weight:      Height:         General: Not in acute distress HEENT:       Eyes: PERRL, EOMI, no scleral icterus.       ENT: No discharge from the ears and nose, no pharynx injection, no tonsillar enlargement.        Neck: No JVD, no bruit, no mass felt. Heme: No neck lymph node enlargement. Cardiac: L8/X2, RRR, 2/6 systolic murmurs, No gallops or rubs. Respiratory: No rales, wheezing, rhonchi or rubs. GI: Soft, nondistended, nontender, no rebound pain, no organomegaly, BS present. GU: No hematuria Ext: has trace leg edema bilaterally. 1+DP/PT pulse bilaterally. Musculoskeletal: No joint deformities, No joint redness or warmth, no limitation of ROM in spin. Skin: No rashes.  Neuro: Alert, oriented X3, cranial nerves II-XII grossly intact,except for right facial droop due to recent Bell's palsy,  moves all extremities normally. Psych: Patient is not psychotic, no suicidal or hemocidal ideation.    Data reviewed:  I have personally reviewed following labs and imaging studies Labs:  CBC: Recent Labs  Lab 07/04/21 0928  WBC 6.6  NEUTROABS 3.9  HGB 12.8  HCT 39.3  MCV 93.3  PLT 119    Basic Metabolic Panel: Recent Labs  Lab 07/04/21 0928  NA 137  K 3.6  CL 101  CO2 26  GLUCOSE 131*  BUN 19  CREATININE 0.76  CALCIUM 8.7*   GFR Estimated Creatinine Clearance: 47.6 mL/min (by C-G formula based on SCr of 0.76 mg/dL). Liver Function Tests: Recent Labs  Lab 07/04/21 0928  AST 24  ALT 18  ALKPHOS 67  BILITOT 0.4  PROT 6.1*  ALBUMIN 3.3*   No results for input(s): "LIPASE", "AMYLASE" in the last 168 hours. No results for input(s): "AMMONIA" in the last 168 hours. Coagulation profile No results for input(s): "INR", "PROTIME" in the last 168 hours.  Cardiac Enzymes: No results for input(s): "CKTOTAL", "CKMB", "CKMBINDEX", "TROPONINI" in the last 168 hours. BNP: Invalid input(s):  "POCBNP" CBG: No results for input(s): "GLUCAP" in the last 168 hours. D-Dimer No results for input(s): "DDIMER" in the last 72 hours. Hgb A1c No results for input(s): "HGBA1C" in the last 72 hours. Lipid Profile No results for input(s): "CHOL", "HDL", "LDLCALC", "TRIG", "CHOLHDL", "LDLDIRECT" in the last 72 hours. Thyroid function studies No results for input(s): "TSH", "T4TOTAL", "T3FREE", "THYROIDAB" in the last 72 hours.  Invalid input(s): "FREET3" Anemia work up No results for input(s): "VITAMINB12", "FOLATE", "FERRITIN", "TIBC", "IRON", "RETICCTPCT" in the last 72 hours. Urinalysis    Component Value Date/Time  COLORURINE YELLOW (A) 07/04/2021 1120   APPEARANCEUR HAZY (A) 07/04/2021 1120   LABSPEC 1.005 07/04/2021 1120   PHURINE 7.0 07/04/2021 1120   GLUCOSEU NEGATIVE 07/04/2021 1120   HGBUR NEGATIVE 07/04/2021 1120   BILIRUBINUR NEGATIVE 07/04/2021 1120   KETONESUR NEGATIVE 07/04/2021 1120   PROTEINUR NEGATIVE 07/04/2021 1120   UROBILINOGEN 1.0 05/20/2009 1356   NITRITE POSITIVE (A) 07/04/2021 1120   LEUKOCYTESUR SMALL (A) 07/04/2021 1120     Microbiology No results found for this or any previous visit (from the past 240 hour(s)).     Inpatient Medications:   Scheduled Meds:  potassium chloride  40 mEq Oral Once   Continuous Infusions:  diltiazem (CARDIZEM) infusion Stopped (07/04/21 1214)     Radiological Exams on Admission: CT Head Wo Contrast  Result Date: 07/04/2021 CLINICAL DATA:  Syncopal episode. Patient passed out of the breakfast stable. EXAM: CT HEAD WITHOUT CONTRAST TECHNIQUE: Contiguous axial images were obtained from the base of the skull through the vertex without intravenous contrast. RADIATION DOSE REDUCTION: This exam was performed according to the departmental dose-optimization program which includes automated exposure control, adjustment of the mA and/or kV according to patient size and/or use of iterative reconstruction technique.  COMPARISON:  CT of the head without contrast 06/27/2021. 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 Mild atrophy and white matter changes are otherwise within normal limits for age. No acute infarct, hemorrhage, or mass lesion is present. The ventricles are of normal size. No significant extraaxial fluid collection is present. The brainstem and cerebellum are within normal limits. Vascular: Atherosclerotic calcifications are present within the right cavernous internal carotid artery. No hyperdense vessel is present. Skull: Calvarium is intact. No focal lytic or blastic lesions are present. No significant extracranial soft tissue lesion is present. Sinuses/Orbits: The paranasal sinuses and mastoid air cells are clear. The globes and orbits are within normal limits. IMPRESSION: 1. No acute intracranial abnormality or significant interval change. 2. 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 Electronically Signed   By: San Morelle M.D.   On: 07/04/2021 12:23   DG Chest Port 1 View  Result Date: 07/04/2021 CLINICAL DATA:  Syncopal episode.  Atrial fibrillation. EXAM: PORTABLE CHEST 1 VIEW COMPARISON:  Radiographs 05/07/2020 and 12/19/2019.  CT 05/07/2020. FINDINGS: 0944 hours. The heart size and mediastinal contours are stable with aortic atherosclerosis. Chronic interstitial prominence at the lung bases appears similar to prior studies. No edema, focal airspace disease, pneumothorax or significant pleural effusion. No acute osseous findings are evident. Telemetry leads overlie the chest. IMPRESSION: Stable cardiomegaly and bibasilar atelectasis or scarring. No acute cardiopulmonary process. Electronically Signed   By: Richardean Sale M.D.   On: 07/04/2021 10:14    Impression/Recommendations Principal Problem:   Syncope Active Problems:   Atrial fibrillation with RVR  (HCC)   Chronic heart failure with preserved ejection fraction (HFpEF) (HCC)   Hypertension   Asthma   Hypothyroidism   Gout    Assessment and Plan: * Syncope Possibly due to atrial fibrillation with RVR.  Patient had hypotension which is also likely due to A-fib with RVR.  Patient has right facial droop which is due to recent Bell's palsy, otherwise no focal neurologic findings on physical examination.  Low suspicions for stroke.  Patient was started on Cardizem drip for atrial fibrillation with RVR, the repeated EKG showed convertion to sinus rhythm.  Heart rate in 70s.  Blood  pressure 107/92 currently.  Patient is asymptomatic currently. ED physician consulted cardiologist, Dr. Harrell Gave, who agreed that pt can be discharged back to the facility.  -Will get CT of the head since patient is on Eliquis and not sure if patient injured her head --> negative for acute issues -Check orthostatic vital sign -IV fluid: Received 1 L normal saline -Frequent neurochecks   Atrial fibrillation with RVR ([place]) Patient is converted to sinus rhythm with Cardizem drip.  Heart rate 70s now.  Patient is asymptomatic, -Potassium 3.6.  Will give 40 mg of potassium chloride -Telemetry monitoring -Continue home Coreg  Chronic heart failure with preserved ejection fraction (HFpEF) ([place]) 2D echo 02/01/2021 showed EF of 60 to 65% with grade 1 diastolic dysfunction.  Patient has trace leg edema, no shortness of breath.  Chest x-ray is negative for pulmonary edema.  CHF seem to be compensated. -Continue home Lasix  Hypertension Currently blood pressure 107/92.  Patient is on Coreg, as needed hydralazine, Cozaar and Lasix. -Follow-up cardiologist recommendation about blood pressure medication adjustment.  Asthma Stable -Bronchodilators, as needed albuterol  Hypothyroidism - Synthroid  Gout - Allopurinol           Thank you for this consultation.  Our [facility] hospitalist team will follow the  patient with you.  Time Spent:  35 min     Ivor Costa M.D. [facility] 07/04/2021, 12:28 PM

## 2021-07-04 NOTE — Assessment & Plan Note (Signed)
Allopurinol  

## 2021-07-04 NOTE — Assessment & Plan Note (Signed)
Stable -Bronchodilators, as needed albuterol

## 2021-07-04 NOTE — Assessment & Plan Note (Signed)
Patient is converted to sinus rhythm with Cardizem drip.  Heart rate 70s now.  Patient is asymptomatic, -Potassium 3.6.  Will give 40 mg of potassium chloride -Telemetry monitoring -Continue home Coreg

## 2021-07-05 ENCOUNTER — Telehealth: Payer: Self-pay | Admitting: Internal Medicine

## 2021-07-05 MED ORDER — FUROSEMIDE 40 MG PO TABS: 20.0000 mg | ORAL_TABLET | ORAL | 0 refills | Status: DC

## 2021-07-05 NOTE — Telephone Encounter (Signed)
Patient c/o Palpitations:  High priority if patient c/o lightheadedness, shortness of breath, or chest pain  How long have you had palpitations/irregular HR/ Afib? Are you having the symptoms now? Yesterday, no  Are you currently experiencing lightheadedness, SOB or CP? no  Do you have a history of afib (atrial fibrillation) or irregular heart rhythm? yes  Have you checked your BP or HR? (document readings if available): yesterday afternoon 120/60 HR 66, 6 pm 140/70, 7 pm 120/70 before losartan, 10 pm 140/70's took losartan, 4 am today 130's, this morning before medication 170/something so she took her medications  Are you experiencing any other symptoms? Low BP yesterday   Patient's daughter states the patient's BP dropped yesterday and she passed out. She says they took her to the ED, because she went into afib. She states they gave her diltiazem and an IV drip and is no longer in afib. She would like to know if any medications need to be adjusted due to symptoms.

## 2021-07-05 NOTE — Telephone Encounter (Signed)
She needs some degree of permissive hypertension.  Please have her hold losartan until she is evaluated later this week.  She should continue carvedilol as long as her systolic blood pressure is greater than 100 mmHg and heart rate at least 60 bpm.

## 2021-07-05 NOTE — Telephone Encounter (Signed)
Left voicemail message to call back regarding further instructions with furosemide.

## 2021-07-05 NOTE — Telephone Encounter (Signed)
Renal function in the ED yesterday was normal, though up from her baseline.  It would be okay for her to hold furosemide for the next 2 days followed by resumption at lower dose 20 mg daily given diet has improved.

## 2021-07-05 NOTE — Telephone Encounter (Signed)
Spoke with daughter and reviewed provider recommendations to hold furosemide for 2 days then restart at 20 mg once daily. She verbalized understanding with no further questions.

## 2021-07-05 NOTE — Telephone Encounter (Signed)
Spoke with patients daughter per release form. She reports her mother went into afib and they took her to ED. She was started on diltiazem drip and then converted. Once home they were monitoring her blood pressures and blood pressure was 120/60 with HR 60 and they had concerns her losartan would drop it too low. Repeat BP was 140/70 and they then had her take the losartan. Her question was to see if she should give her the carvedilol and losartan together. She did want me to call back with recommendations.  She has follow up scheduled for this Friday with Cadence Kathlen Mody PA-C here in the office. I do see DOD & New patient slots for Dr. Saunders Revel as well.

## 2021-07-05 NOTE — Telephone Encounter (Signed)
Patient's daughter returning call. 

## 2021-07-05 NOTE — Telephone Encounter (Signed)
Reviewed recommendations from provider and she then inquired about her daily furosemide stating that since she has been in facility her weight is down, swelling is better, and wondered if that would also help with her blood pressures. She states that while there they have been able to control her diet and sodium which has helped. Recommended that she can try holding between now and her appointment and continue to monitor. Daughter also needed to reschedule her follow up and it was moved to the 13th with Christell Faith PA-C. Advised to monitor patient and give [place] a call back if she has any concerns before that appointment.

## 2021-07-05 NOTE — Telephone Encounter (Signed)
Spoke with patient to review previous call. She reports daughter called and advised I would giver her a call.

## 2021-07-09 ENCOUNTER — Ambulatory Visit: Payer: PPO | Admitting: Medical

## 2021-07-09 DIAGNOSIS — G319 Degenerative disease of nervous system, unspecified: Secondary | ICD-10-CM | POA: Diagnosis not present

## 2021-07-09 DIAGNOSIS — I471 Supraventricular tachycardia: Secondary | ICD-10-CM | POA: Diagnosis not present

## 2021-07-09 DIAGNOSIS — M069 Rheumatoid arthritis, unspecified: Secondary | ICD-10-CM | POA: Diagnosis not present

## 2021-07-09 DIAGNOSIS — M109 Gout, unspecified: Secondary | ICD-10-CM | POA: Diagnosis not present

## 2021-07-09 DIAGNOSIS — R42 Dizziness and giddiness: Secondary | ICD-10-CM | POA: Diagnosis not present

## 2021-07-09 DIAGNOSIS — R32 Unspecified urinary incontinence: Secondary | ICD-10-CM | POA: Diagnosis not present

## 2021-07-09 DIAGNOSIS — Z8673 Personal history of transient ischemic attack (TIA), and cerebral infarction without residual deficits: Secondary | ICD-10-CM | POA: Diagnosis not present

## 2021-07-09 DIAGNOSIS — G4733 Obstructive sleep apnea (adult) (pediatric): Secondary | ICD-10-CM | POA: Diagnosis not present

## 2021-07-09 DIAGNOSIS — I5032 Chronic diastolic (congestive) heart failure: Secondary | ICD-10-CM | POA: Diagnosis not present

## 2021-07-09 DIAGNOSIS — I480 Paroxysmal atrial fibrillation: Secondary | ICD-10-CM | POA: Diagnosis not present

## 2021-07-09 DIAGNOSIS — E559 Vitamin D deficiency, unspecified: Secondary | ICD-10-CM | POA: Diagnosis not present

## 2021-07-09 DIAGNOSIS — H3530 Unspecified macular degeneration: Secondary | ICD-10-CM | POA: Diagnosis not present

## 2021-07-09 DIAGNOSIS — E7800 Pure hypercholesterolemia, unspecified: Secondary | ICD-10-CM | POA: Diagnosis not present

## 2021-07-09 DIAGNOSIS — N1832 Chronic kidney disease, stage 3b: Secondary | ICD-10-CM | POA: Diagnosis not present

## 2021-07-09 DIAGNOSIS — I350 Nonrheumatic aortic (valve) stenosis: Secondary | ICD-10-CM | POA: Diagnosis not present

## 2021-07-09 DIAGNOSIS — I7121 Aneurysm of the ascending aorta, without rupture: Secondary | ICD-10-CM | POA: Diagnosis not present

## 2021-07-09 DIAGNOSIS — Z9181 History of falling: Secondary | ICD-10-CM | POA: Diagnosis not present

## 2021-07-09 DIAGNOSIS — H6123 Impacted cerumen, bilateral: Secondary | ICD-10-CM | POA: Diagnosis not present

## 2021-07-09 DIAGNOSIS — M170 Bilateral primary osteoarthritis of knee: Secondary | ICD-10-CM | POA: Diagnosis not present

## 2021-07-09 DIAGNOSIS — M810 Age-related osteoporosis without current pathological fracture: Secondary | ICD-10-CM | POA: Diagnosis not present

## 2021-07-09 DIAGNOSIS — G629 Polyneuropathy, unspecified: Secondary | ICD-10-CM | POA: Diagnosis not present

## 2021-07-09 DIAGNOSIS — K219 Gastro-esophageal reflux disease without esophagitis: Secondary | ICD-10-CM | POA: Diagnosis not present

## 2021-07-09 DIAGNOSIS — E039 Hypothyroidism, unspecified: Secondary | ICD-10-CM | POA: Diagnosis not present

## 2021-07-09 DIAGNOSIS — I130 Hypertensive heart and chronic kidney disease with heart failure and stage 1 through stage 4 chronic kidney disease, or unspecified chronic kidney disease: Secondary | ICD-10-CM | POA: Diagnosis not present

## 2021-07-09 NOTE — Progress Notes (Deleted)
Cardiology Office Note:    Date:  07/09/2021   ID:  Jessica Kaufman, DOB 09-24-1925, MRN [id number]  PCP:  Leonides Sake, MD  [facility] Cardiologist:  Nelva Bush, MD  [facility] Electrophysiologist:  None   Referring MD: Leonides Sake, MD   Chief Complaint: ER follow-up  History of Present Illness:    Jessica Kaufman is a 88 y.o. female with a hx of PAF, HFpEF, labile hypertension mild to moderate aortic stenosis, syncope, PVCs, HLD, mechanical falls, and rheumatoid arthritis who presents for ER follow-up.   She was seen in the office in 03/2021, at which time the patient and her daughter were concerned regarding recent spikes in blood pressure.  This coincided with a refractory UTI, for which she was on her third course of antibiotic therapy.  BP had been labile ranging from 127/79 to 225/115 over the preceding 4 days.  She had taken several doses of as needed hydralazine.  Lower extremity swelling was also a little bit worse with associated 2 to 3 pound weight gain above baseline.  HCTZ was discontinued and furosemide was titrated to 40 mg daily.  She was otherwise continued on carvedilol and losartan, as well as as needed hydralazine.  She was seen in the ED on 03/19/2021 after sustaining a mechanical fall without syncope.  CT head showed no acute intracranial abnormality.   Our office was notified on 03/24/2021 that the patient was having increasing lower extremity swelling with BP readings in the 967E to 938B systolic.  Given this, her for Lasix was titrated to 40 mg twice daily.  With this, we were notified on 03/26/2021 that the patient felt weak and was having low blood pressures in the low 100s ranging to 017P systolic.  Lower extremity swelling had improved.  Lasix was decreased back to 40 mg daily.  She was seen 06/18/21 and was doing well from a cardiac perspective. She was taking lasix '40mg'$  alternating with '20mg'$  daily. She was in NSR.  Seen in the ER 07/04/21  for afib RVR. She was given IV dilt and hydralazine. They were unsure the patient had not missed any doses of Eliquis, so she was not cardioverted.   Today,    Past Medical History:  Diagnosis Date   Aortic valve disease    Mild-moderate aortic stenosis   Asthma    Cancer ([place])    basal cell    Gout    Hyperlipidemia    Hypertension    Hypertensive heart disease without heart failure    Hypothyroidism    PAF (paroxysmal atrial fibrillation) ([place])    PVC (premature ventricular contraction)    Rheumatoid arthritis ([place])    Ventricular premature depolarization     Past Surgical History:  Procedure Laterality Date   BLADDER SURGERY  10/19/2010   BONY PELVIS SURGERY  1990   BREAST BIOPSY Left 1986   CARDIAC SURGERY  2009   PVC   CATARACT EXTRACTION Left 11/15/00   CATARACT EXTRACTION Right    ELBOW SURGERY  06/1990   HIP SURGERY Left 05/28/09   HIP SURGERY Right 06/30/03   HYSTEROSCOPY  06/17/1986   vaginal    Current Medications: No outpatient medications have been marked as taking for the 07/09/21 encounter (Appointment) with Kathlen Mody, Damin Salido H, PA-C.     Allergies:   Metronidazole, Sulindac, and Naproxen   Social History   Socioeconomic History   Marital status: Widowed    Spouse name: Not on file  Number of children: 7   Years of education: 12th   Highest education level: Not on file  Occupational History   Occupation: Retired  Tobacco Use   Smoking status: Never   Smokeless tobacco: Never  Vaping Use   Vaping Use: Never used  Substance and Sexual Activity   Alcohol use: No   Drug use: No   Sexual activity: Not on file  Other Topics Concern   Not on file  Social History Narrative   Patient lives at home alone.Caffeine Use: 1 cup daily 7 children (1 deceased)   Social Determinants of Health   Financial Resource Strain: Not on file  Food Insecurity: Not on file  Transportation Needs: Not on file  Physical Activity: Not on file  Stress: Not on file   Social Connections: Not on file     Family History: The patient's =family history includes Breast cancer in her daughter; Heart attack in her father and maternal grandmother; Heart failure in her sister; Melanoma in her mother and sister; Stroke in her mother; Uterine cancer in her sister.  ROS:   Please see the history of present illness.     All other systems reviewed and are negative.  EKGs/Labs/Other Studies Reviewed:    The following studies were reviewed today:  2D echo 02/01/2021: 1. Left ventricular ejection fraction, by estimation, is 60 to 65%. Left  ventricular ejection fraction by 2D MOD biplane is 61.2 %. The left  ventricle has normal function. The left ventricle has no regional wall  motion abnormalities. There is mild left  ventricular hypertrophy. Left ventricular diastolic parameters are  consistent with Grade I diastolic dysfunction (impaired relaxation).   2. Right ventricular systolic function is normal. The right ventricular  size is normal.   3. The mitral valve is normal in structure. Mild mitral valve  regurgitation.   4. Mean aortic valve gradient 15 mmHg, peak gradient 22 mmHg, Vmax  1.41ms, AVA 1.58cm2.. The aortic valve is calcified. Aortic valve  regurgitation is not visualized. Mild to moderate aortic valve stenosis.   5. The inferior vena cava is normal in size with greater than 50%  respiratory variability, suggesting right atrial pressure of 3 mmHg.   Comparison(s): EF 55-60%; Moderate AS. __________   2D echo 05/08/2020: 1. Left ventricular ejection fraction, by estimation, is 50 to 55%. The  left ventricle has low normal function. Left ventricular endocardial  border not optimally defined to evaluate regional wall motion. There is  mild left ventricular hypertrophy. Left  ventricular diastolic parameters are indeterminate.   2. Right ventricular systolic function is normal. The right ventricular  size is normal.   3. The mitral valve is  normal in structure. Mild mitral valve  regurgitation. No evidence of mitral stenosis.   4. The aortic valve was not well visualized. Aortic valve regurgitation  is not visualized. Moderate aortic valve stenosis. Aortic gradient does  not seem accurate. Aortic stenosis seems at least moderate based on valve  morphology and restricted opening.   5. The inferior vena cava is normal in size with <50% respiratory  variability, suggesting right atrial pressure of 8 mmHg. __________   2D echo 04/03/2019: 1. Left ventricular ejection fraction, by estimation, is 55 to 60%. The  left ventricle has normal function. The left ventricle has no regional  wall motion abnormalities. Left ventricular diastolic parameters were  normal.   2. Right ventricular systolic function is normal. The right ventricular  size is normal.   3. The mitral  valve is grossly normal. Trivial mitral valve  regurgitation.   4. The aortic valve was not well visualized. Aortic valve regurgitation  is not visualized. Moderate aortic valve stenosis.   5. The inferior vena cava is normal in size with greater than 50%  respiratory variability, suggesting right atrial pressure of 3 mmHg. __________   Elwyn Reach patch 02/2019: The patient was monitored for 13 days, 23 hours. The predominant rhythm was sinus with an average rate of 62 bpm (range 41 to 107 bpm in sinus). Rare PACs and PVCs occurred. There were 39 episodes of paroxysmal supraventricular tachycardia lasting up to 10 beats with a maximum rate of 184 bpm. Brief episodes of junctional rhythm occurred, often in the setting of PACs and PVCs. Patient triggered events correspond to sinus rhythm, PACs, and PVCs transient junctional rhythm.   Predominately sinus rhythm with rare PACs and PVCs.  39 episodes of brief PSVT occurred, as well as transient junctional rhythm (often in the setting of PACs and PVCs).    EKG:  EKG is *** ordered today.  The ekg ordered today demonstrates  ***  Recent Labs: 07/04/2021: ALT 18; BUN 19; Creatinine, Ser 0.76; Hemoglobin 12.8; Platelets 325; Potassium 3.6; Sodium 137  Recent Lipid Panel    Component Value Date/Time   CHOL 199 05/08/2020 0522   TRIG 88 05/08/2020 0522   HDL 59 05/08/2020 0522   CHOLHDL 3.4 05/08/2020 0522   VLDL 18 05/08/2020 0522   LDLCALC 122 (H) 05/08/2020 0522     Risk Assessment/Calculations:   {Does this patient have ATRIAL FIBRILLATION?:[id number]}   Physical Exam:    VS:  There were no vitals taken for this visit.    Wt Readings from Last 3 Encounters:  07/04/21 213 lb (96.6 kg)  06/27/21 213 lb (96.6 kg)  06/18/21 218 lb 3.2 oz (99 kg)     GEN: *** Well nourished, well developed in no acute distress HEENT: Normal NECK: No JVD; No carotid bruits LYMPHATICS: No lymphadenopathy CARDIAC: ***RRR, no murmurs, rubs, gallops RESPIRATORY:  Clear to auscultation without rales, wheezing or rhonchi  ABDOMEN: Soft, non-tender, non-distended MUSCULOSKELETAL:  No edema; No deformity  SKIN: Warm and dry NEUROLOGIC:  Alert and oriented x 3 PSYCHIATRIC:  Normal affect   ASSESSMENT:    No diagnosis found. PLAN:    In order of problems listed above:  Labile HTN  PAF  HFpEF  Aortic stenosis    Disposition: Follow up {follow up:15908} with ***   Shared Decision Making/Informed Consent   {Are you ordering a CV Procedure (e.g. stress test, cath, DCCV, TEE, etc)?   Press F2        :[id number]}    Signed, Shron Ozer Ninfa Meeker, PA-C  07/09/2021 9:32 AM    [facility]

## 2021-07-12 ENCOUNTER — Encounter: Payer: Self-pay | Admitting: Medical

## 2021-07-14 DIAGNOSIS — G510 Bell's palsy: Secondary | ICD-10-CM | POA: Diagnosis not present

## 2021-07-14 DIAGNOSIS — H353211 Exudative age-related macular degeneration, right eye, with active choroidal neovascularization: Secondary | ICD-10-CM | POA: Diagnosis not present

## 2021-07-14 DIAGNOSIS — H353122 Nonexudative age-related macular degeneration, left eye, intermediate dry stage: Secondary | ICD-10-CM | POA: Diagnosis not present

## 2021-07-14 DIAGNOSIS — H35033 Hypertensive retinopathy, bilateral: Secondary | ICD-10-CM | POA: Diagnosis not present

## 2021-07-15 ENCOUNTER — Ambulatory Visit (INDEPENDENT_AMBULATORY_CARE_PROVIDER_SITE_OTHER): Payer: PPO

## 2021-07-15 ENCOUNTER — Encounter: Payer: Self-pay | Admitting: Physician Assistant

## 2021-07-15 ENCOUNTER — Ambulatory Visit (INDEPENDENT_AMBULATORY_CARE_PROVIDER_SITE_OTHER): Payer: PPO | Admitting: Physician Assistant

## 2021-07-15 VITALS — BP 162/80 | HR 62 | Ht 63.0 in | Wt 216.0 lb

## 2021-07-15 DIAGNOSIS — R55 Syncope and collapse: Secondary | ICD-10-CM | POA: Diagnosis not present

## 2021-07-15 DIAGNOSIS — I480 Paroxysmal atrial fibrillation: Secondary | ICD-10-CM | POA: Diagnosis not present

## 2021-07-15 DIAGNOSIS — I5032 Chronic diastolic (congestive) heart failure: Secondary | ICD-10-CM

## 2021-07-15 DIAGNOSIS — I350 Nonrheumatic aortic (valve) stenosis: Secondary | ICD-10-CM

## 2021-07-15 DIAGNOSIS — R0989 Other specified symptoms and signs involving the circulatory and respiratory systems: Secondary | ICD-10-CM

## 2021-07-15 NOTE — Patient Instructions (Signed)
Medication Instructions:  No changes at this time.   *If you need a refill on your cardiac medications before your next appointment, please call your pharmacy*   Lab Work: None  If you have labs (blood work) drawn today and your tests are completely normal, you will receive your results only by: [place] (if you have MyChart) OR A paper copy in the mail If you have any lab test that is abnormal or we need to change your treatment, we will call you to review the results.   Testing/Procedures: Your physician has recommended that you wear a Zio AT Live monitor.   This monitor is a medical device that records the heart's electrical activity. Doctors most often use these monitors to diagnose arrhythmias. Arrhythmias are problems with the speed or rhythm of the heartbeat. The monitor is a small device applied to your chest. You can wear one while you do your normal daily activities. While wearing this monitor if you have any symptoms to push the button and record what you felt. Once you have worn this monitor for the period of time provider prescribed (Usually 14 days), you will return the monitor device in the postage paid box/bag. Once it is returned they will download the data collected and provide [place] with a report which the provider will then review and we will call you with those results.   Important tips:  Avoid showering during the first 24 hours of wearing the monitor. Avoid excessive sweating to help maximize wear time. Do not submerge the device, no hot tubs, and no swimming pools. Keep any lotions or oils away from the patch. After 24 hours you may shower with the patch on. Take brief showers with your back facing the shower head.  Do not remove patch once it has been placed because that will interrupt data and decrease adhesive wear time. Push the button when you have any symptoms and write down what you were feeling. Once you have completed wearing your monitor, remove and  place into box which has postage paid and place in your outgoing mailbox.  If for some reason you have misplaced your box then call our office and we can provide another box and/or mail it off for you. Keep the transmitter within 10 feet at all times.  Expect a welcome phone call within 32-20 hrs of application from [place].  This call will include your copay information, so please answer any unknown phone calls while wearing Zio (it could also be important information about your heart) The envelope to return Zio is in the back of the transmitter. Removal instructions are on the last page of the symptom diary.  Place the patch sticky side up inside the transmitter and the symptom diary inside the envelope to return on your last wear day inside your mailbox or any USPS mailbox.   Follow-Up: At [facility], you and your health needs are our priority.  As part of our continuing mission to provide you with exceptional heart care, we have created designated Provider Care Teams.  These Care Teams include your primary Cardiologist (physician) and Advanced Practice Providers (APPs -  Physician Assistants and Nurse Practitioners) who all work together to provide you with the care you need, when you need it.   Your next appointment:   8 week(s)  The format for your next appointment:   In Person  Provider:   Nelva Bush, MD or Christell Faith, PA-C       Important Information About  [place]

## 2021-07-15 NOTE — Progress Notes (Signed)
Cardiology Office Note    Date:  07/15/2021   ID:  [name], DOB January 29, 1925, MRN [id number]  PCP:  Jessica Sake, MD  Cardiologist:  Jessica Bush, MD  Electrophysiologist:  None   Chief Complaint: ED follow-up  History of Present Illness:   Jessica Kaufman is a 88 y.o. female with history of PAF, HFpEF, labile hypertension mild to moderate aortic stenosis, syncope, history of CVA noted on CT imaging, PVCs, HLD, mechanical falls, and rheumatoid arthritis who presents for ED follow-up.   Zio patch in 02/2019 showed a predominant rhythm of sinus with an average rate of 62 bpm (range 41 to 107 bpm in sinus), 39 episodes of paroxysmal SVT lasting up to 10 beats with a maximum rate of 184 bpm, rare PACs and PVCs, brief episodes of junctional rhythm occurred often in the setting of PACs and PVCs, patient triggered events corresponded to sinus rhythm, PACs, PVCs, and transient junctional rhythm.  Echo in 03/2019 showed an EF of 55 to 60%, no regional wall motion abnormalities, normal LV diastolic function parameters, normal RV systolic function and ventricular cavity size, trivial mitral regurgitation, moderate aortic stenosis, and an estimated right atrial pressure of 3 mmHg.  Echo in 05/2020 showed an EF of 50 to 55%, mild LVH, indeterminate LV diastolic function parameters, normal RV systolic function and ventricular cavity size, mild mitral regurgitation, moderate aortic stenosis, and an estimated right atrial pressure of 8 mmHg.  Most recent echo from 01/2021 demonstrated an EF of 60 to 65%, no regional wall motion abnormalities, mild LVH, grade 1 diastolic dysfunction, normal RV systolic function and ventricular cavity size, mild mitral regurgitation, and mild to moderate aortic valve stenosis with a mean gradient of 15 mmHg and a valve area of 1.58 cm.   She was seen in the office in 03/2021, at which time the patient and her daughter were concerned regarding recent spikes in blood  pressure.  This coincided with a refractory UTI, for which she was on her third course of antibiotic therapy.  BP had been labile ranging from 127/79 to 225/115 over the preceding 4 days.  She had taken several doses of as needed hydralazine.  Lower extremity swelling was also a little bit worse with associated 2 to 3 pound weight gain above baseline.  HCTZ was discontinued and furosemide was titrated to 40 mg daily.  She was otherwise continued on carvedilol and losartan, as well as as needed hydralazine.   She was seen in the ED on 03/19/2021 after sustaining a mechanical fall without syncope.  CT head showed no acute intracranial abnormality.   Our office was notified on 03/24/2021 that the patient was having increasing lower extremity swelling with BP readings in the 749S to 496P systolic.  Given this, her for Lasix was titrated to 40 mg twice daily.  With this, we were notified on 03/26/2021 that the patient felt weak and was having low blood pressures in the low 100s ranging to 591M systolic.  Lower extremity swelling had improved.  Lasix was decreased back to 40 mg daily.   She was seen in the office on 04/15/2021 and was without symptoms of angina or decompensation.  Since that she had last been seen, she had been taking furosemide 40 mg daily with an extra 20 mg on an every other day basis.  With this, she had noted stabilization of her lower extremity swelling and some stabilization of her blood pressure readings.  Her Lasix was transitioned to  40 mg daily in the morning and 20 mg daily in the afternoon.  She was last seen in the office in 06/2021 and was doing well from a cardiac perspective, without symptoms of angina or decompensation.  She was getting over bronchitis.  Blood pressures had been stable in the 540J to 811B systolic.  She had been taking hydralazine once per day around 3 to 4 PM, with this, it was recommended she take hydralazine 25 mg scheduled in the afternoons.  She was continued on  furosemide 40 mg in the morning and 20 mg in the afternoon.  She was seen in the ED on 06/27/2021 with facial droop concerning for Bell's palsy.  CT of the head showed no acute intracranial abnormality with chronic small vessel ischemic disease.  MRI of the brain was negative for infarct with atrophy and chronic microvascular ischemic changes noted and stable.  He was seen in the ED on 07/04/2021 with loss of consciousness and palpitations.  Note indicates BP of 68/36 subsequently improved to 107/92.  EMS found the patient to be in A-fib with RVR.  Chest x-ray shows stable cardiomegaly with bibasilar atelectasis or scarring with no acute cardiopulmonary process.  CT head showed no acute intracranial abnormality or significant interval change with a stable remote infarct involving the right lentiform nucleus and corona radiata.  She was started on a Cardizem drip and converted to sinus rhythm and was subsequently discharged.  She comes in accompanied by her daughter today and is doing reasonably well from a cardiac perspective.  She has not had any further episodes of syncope.  She has had a couple episodes of short-lived palpitations.  No chest pain or dyspnea.  She continues to deal with right-sided facial droop secondary to Bell's palsy.  With noted elevated BP readings since she was last seen, she is now taking losartan 50 mg twice daily and furosemide 20 mg twice daily along with carvedilol and hydralazine.  She has occasionally needed an extra hydralazine prior to bed, though more recently blood pressures have stabilized with readings in the 147W to 295A systolic.  Her lower extremity swelling is improved.  No significant orthopnea.  No bleeding concerns.   Labs independently reviewed: 07/2020 - Hgb 12.8, PLT 325, potassium 3.6, BUN 19, serum creatinine 0.76, albumin 3.3, AST/ALT normal 05/2020 - magnesium 2.0, TSH normal, TC 199, TG 88, HDL 59, LDL 122, A1c 5.5  Past Medical History:  Diagnosis Date    Aortic valve disease    Mild-moderate aortic stenosis   Asthma    Cancer (HCC)    basal cell    Gout    Hyperlipidemia    Hypertension    Hypertensive heart disease without heart failure    Hypothyroidism    PAF (paroxysmal atrial fibrillation) (HCC)    PVC (premature ventricular contraction)    Rheumatoid arthritis ([place])    Ventricular premature depolarization     Past Surgical History:  Procedure Laterality Date   BLADDER SURGERY  10/19/2010   BONY PELVIS SURGERY  1990   BREAST BIOPSY Left 1986   CARDIAC SURGERY  2009   PVC   CATARACT EXTRACTION Left 11/15/00   CATARACT EXTRACTION Right    ELBOW SURGERY  06/1990   HIP SURGERY Left 05/28/09   HIP SURGERY Right 06/30/03   HYSTEROSCOPY  06/17/1986   vaginal    Current Medications: Current Meds  Medication Sig   acetaminophen (TYLENOL) 650 MG CR tablet Take 650 mg by mouth every 8 (eight)  hours as needed for pain.   Albuterol Sulfate (PROAIR RESPICLICK) 403 (90 Base) MCG/ACT AEPB Inhale 2 puffs into the lungs every 6 (six) hours as needed.   amoxicillin (AMOXIL) 500 MG tablet Take 4 tablets (2,000 mg total) by mouth 2 (two) times daily. For dental procedures only   apixaban (ELIQUIS) 5 MG TABS tablet Take 5 mg by mouth 2 (two) times daily.    b complex vitamins tablet Take 1 tablet by mouth daily.   Bilberry, Vaccinium myrtillus, 1000 MG CAPS Take 2 capsules by mouth daily.   Bioflavonoid Products (BIOFLEX) TABS Take 2 tablets by mouth daily.   carvedilol (COREG) 3.125 MG tablet Take 1 tablet (3.125 mg total) by mouth 2 (two) times daily with a meal.   Cholecalciferol (VITAMIN D3) 1000 UNITS CAPS Take 1 capsule by mouth daily.   Coenzyme Q10 (COQ10) 100 MG CAPS Take 1 capsule by mouth daily.   CRANBERRY PO Take 1 tablet by mouth daily.   erythromycin ophthalmic ointment Place 1 Application into the right eye at bedtime.   famotidine (PEPCID) 20 MG tablet Take 20 mg by mouth daily.   fexofenadine (ALLEGRA) 180 MG tablet  Take 180 mg by mouth daily.   Fluticasone-Umeclidin-Vilant (TRELEGY ELLIPTA) 100-62.5-25 MCG/ACT AEPB Inhale 100 mcg into the lungs daily.   furosemide (LASIX) 40 MG tablet Take 0.5 tablets (20 mg total) by mouth as directed. Take half a tablet (20 mg) once daily   gabapentin (NEURONTIN) 600 MG tablet Take 600 mg by mouth 2 (two) times daily.   Ginger, Zingiber officinalis, (GINGER ROOT PO) Take by mouth daily.   hydrALAZINE (APRESOLINE) 25 MG tablet TAKE 1 TABLET BY MOUTH ONCE A DAY AND TWICE A DAY AS NEEDED FOR SYSTOLIC BP OF 474 OR GREATER   levothyroxine (SYNTHROID, LEVOTHROID) 50 MCG tablet Take 1 tablet by mouth daily.   losartan (COZAAR) 50 MG tablet Take 1 tablet (50 mg total) by mouth 2 (two) times daily.   Multiple Vitamins-Minerals (CENTRUM SILVER PO) Take 1 tablet by mouth daily.   Multiple Vitamins-Minerals (PRESERVISION AREDS 2 PO) Take 1 capsule by mouth 2 (two) times daily.    Omega-3 Fatty Acids (FISH OIL) 1000 MG CAPS Take 2 capsules by mouth daily.   PREMARIN vaginal cream Place 1 Applicatorful vaginally in the morning and at bedtime.    psyllium (METAMUCIL) 58.6 % powder Take 1 packet by mouth daily.   Pyridoxine HCl (VITAMIN B6 PO) Take 1,000 mg by mouth daily.   TURMERIC PO Take by mouth daily.    Allergies:   Metronidazole, Sulindac, and Naproxen   Social History   Socioeconomic History   Marital status: Widowed    Spouse name: Not on file   Number of children: 7   Years of education: 12th   Highest education level: Not on file  Occupational History   Occupation: Retired  Tobacco Use   Smoking status: Never   Smokeless tobacco: Never  Vaping Use   Vaping Use: Never used  Substance and Sexual Activity   Alcohol use: No   Drug use: No   Sexual activity: Not on file  Other Topics Concern   Not on file  Social History Narrative   Patient lives at home alone.Caffeine Use: 1 cup daily 7 children (1 deceased)   Social Determinants of Health   Financial  Resource Strain: Not on file  Food Insecurity: Not on file  Transportation Needs: Not on file  Physical Activity: Not on file  Stress: Not  on file  Social Connections: Not on file     Family History:  The patient's family history includes Breast cancer in her daughter; Heart attack in her father and maternal grandmother; Heart failure in her sister; Melanoma in her mother and sister; Stroke in her mother; Uterine cancer in her sister.  ROS:   12-point review of systems is negative unless otherwise noted in the HPI   EKGs/Labs/Other Studies Reviewed:    Studies reviewed were summarized above. The additional studies were reviewed today:  2D echo 02/01/2021: 1. Left ventricular ejection fraction, by estimation, is 60 to 65%. Left  ventricular ejection fraction by 2D MOD biplane is 61.2 %. The left  ventricle has normal function. The left ventricle has no regional wall  motion abnormalities. There is mild left  ventricular hypertrophy. Left ventricular diastolic parameters are  consistent with Grade I diastolic dysfunction (impaired relaxation).   2. Right ventricular systolic function is normal. The right ventricular  size is normal.   3. The mitral valve is normal in structure. Mild mitral valve  regurgitation.   4. Mean aortic valve gradient 15 mmHg, peak gradient 22 mmHg, Vmax  1.60ms, AVA 1.58cm2.. The aortic valve is calcified. Aortic valve  regurgitation is not visualized. Mild to moderate aortic valve stenosis.   5. The inferior vena cava is normal in size with greater than 50%  respiratory variability, suggesting right atrial pressure of 3 mmHg.   Comparison(s): EF 55-60%; Moderate AS. __________   2D echo 05/08/2020: 1. Left ventricular ejection fraction, by estimation, is 50 to 55%. The  left ventricle has low normal function. Left ventricular endocardial  border not optimally defined to evaluate regional wall motion. There is  mild left ventricular hypertrophy. Left   ventricular diastolic parameters are indeterminate.   2. Right ventricular systolic function is normal. The right ventricular  size is normal.   3. The mitral valve is normal in structure. Mild mitral valve  regurgitation. No evidence of mitral stenosis.   4. The aortic valve was not well visualized. Aortic valve regurgitation  is not visualized. Moderate aortic valve stenosis. Aortic gradient does  not seem accurate. Aortic stenosis seems at least moderate based on valve  morphology and restricted opening.   5. The inferior vena cava is normal in size with <50% respiratory  variability, suggesting right atrial pressure of 8 mmHg. __________   2D echo 04/03/2019: 1. Left ventricular ejection fraction, by estimation, is 55 to 60%. The  left ventricle has normal function. The left ventricle has no regional  wall motion abnormalities. Left ventricular diastolic parameters were  normal.   2. Right ventricular systolic function is normal. The right ventricular  size is normal.   3. The mitral valve is grossly normal. Trivial mitral valve  regurgitation.   4. The aortic valve was not well visualized. Aortic valve regurgitation  is not visualized. Moderate aortic valve stenosis.   5. The inferior vena cava is normal in size with greater than 50%  respiratory variability, suggesting right atrial pressure of 3 mmHg. __________   ZElwyn Reachpatch 02/2019: The patient was monitored for 13 days, 23 hours. The predominant rhythm was sinus with an average rate of 62 bpm (range 41 to 107 bpm in sinus). Rare PACs and PVCs occurred. There were 39 episodes of paroxysmal supraventricular tachycardia lasting up to 10 beats with a maximum rate of 184 bpm. Brief episodes of junctional rhythm occurred, often in the setting of PACs and PVCs. Patient triggered events correspond  to sinus rhythm, PACs, and PVCs transient junctional rhythm.   Predominately sinus rhythm with rare PACs and PVCs.  39 episodes of brief  PSVT occurred, as well as transient junctional rhythm (often in the setting of PACs and PVCs).    EKG:  EKG is not ordered today.  Please see recent EKG on 07/04/2021.  Recent Labs: 07/04/2021: ALT 18; BUN 19; Creatinine, Ser 0.76; Hemoglobin 12.8; Platelets 325; Potassium 3.6; Sodium 137  Recent Lipid Panel    Component Value Date/Time   CHOL 199 05/08/2020 0522   TRIG 88 05/08/2020 0522   HDL 59 05/08/2020 0522   CHOLHDL 3.4 05/08/2020 0522   VLDL 18 05/08/2020 0522   LDLCALC 122 (H) 05/08/2020 0522    PHYSICAL EXAM:    VS:  BP (!) 162/80   Pulse 62   Ht '5\' 3"'$  (1.6 m)   Wt 216 lb (98 kg)   SpO2 97%   BMI 38.26 kg/m   BMI: Body mass index is 38.26 kg/m.  Physical Exam Vitals reviewed.  Constitutional:      Appearance: She is well-developed.  HENT:     Head: Normocephalic and atraumatic.  Eyes:     General:        Right eye: No discharge.        Left eye: No discharge.  Neck:     Vascular: No JVD.  Cardiovascular:     Rate and Rhythm: Normal rate and regular rhythm.     Pulses:          Posterior tibial pulses are 2+ on the right side and 2+ on the left side.     Heart sounds: S1 normal and S2 normal. Heart sounds not distant. No midsystolic click and no opening snap. Murmur heard.     Harsh midsystolic murmur is present with a grade of 2/6 at the upper right sternal border radiating to the neck.     No friction rub.  Pulmonary:     Effort: Pulmonary effort is normal. No respiratory distress.     Breath sounds: Normal breath sounds. No decreased breath sounds, wheezing or rales.  Chest:     Chest wall: No tenderness.  Abdominal:     General: There is no distension.  Musculoskeletal:     Cervical back: Normal range of motion.     Right lower leg: No edema.     Left lower leg: No edema.  Skin:    General: Skin is warm and dry.     Nails: There is no clubbing.  Neurological:     Mental Status: She is alert and oriented to person, place, and time.     Cranial  Nerves: Facial asymmetry present.     Comments: Right-sided facial droop with eye patch noted over the right orbit.  Psychiatric:        Speech: Speech normal.        Behavior: Behavior normal.        Thought Content: Thought content normal.        Judgment: Judgment normal.     Wt Readings from Last 3 Encounters:  07/15/21 216 lb (98 kg)  07/04/21 213 lb (96.6 kg)  06/27/21 213 lb (96.6 kg)     ASSESSMENT & PLAN:   Syncope with history of labile hypertension: Recent episode of syncope appears to have been in the context of hypotension with known labile hypertension, possibly exacerbated by A-fib with RVR.  Cannot exclude alternative arrhythmia, prolonged RR interval, or posttermination  pause.  Place ZIO AT.  Maintain permissive hypertension with goal blood pressures typically in the 932T to 557D systolic.  Continue current medical therapy including carvedilol, losartan, furosemide, and hydralazine.  PAF: Maintaining sinus rhythm.  A-fib with RVR possibly exacerbated episode of hypotension as outlined above.  She remains on carvedilol and apixaban without symptoms concerning for bleeding.  Recent labs stable as outlined above.  HFpEF: She appears euvolemic and well compensated.  She is on furosemide 20 mg twice daily.  Aortic stenosis: Mild to moderate and stable on most recent echo.  Her degree of aortic stenosis is not severe enough to have caused syncope.  Monitor with periodic echo.    Disposition: F/u with Dr. Saunders Revel or an APP after [facility] patch.   Medication Adjustments/Labs and Tests Ordered: Current medicines are reviewed at length with the patient today.  Concerns regarding medicines are outlined above. Medication changes, Labs and Tests ordered today are summarized above and listed in the Patient Instructions accessible in Encounters.   Signed, Christell Faith, PA-C 07/15/2021 11:58 AM     [place] [address] [place] [address] [place], [place] [address] [phone]

## 2021-07-16 ENCOUNTER — Telehealth: Payer: Self-pay | Admitting: Physician Assistant

## 2021-07-16 NOTE — Telephone Encounter (Signed)
Diastolic blood pressures may be running on the higher side in the setting of increased stress following her recent diagnosis of Bell's palsy and recent episode of A-fib requiring ED evaluation.  We do need some degree of permissive blood pressure given labile hypertension.  Blood pressure at her visit yesterday was 162/80, which for her is reasonable.  Ideally, we would like her heart rates in the 60s to 80s bpm.  If her blood pressure is remaining greater than 140/90 she may take an additional 25 mg hydralazine.  Ensure she is maintaining a low-sodium diet as well.

## 2021-07-16 NOTE — Telephone Encounter (Signed)
Reviewed recommendations with patients daughter per release form. She verbalized understanding with no further questions at this time. Encouraged her to call back if any further concerns.

## 2021-07-16 NOTE — Telephone Encounter (Signed)
Pt c/o BP issue: STAT if pt c/o blurred vision, one-sided weakness or slurred speech  1. What are your last 5 BP readings? 130/117 and pulse 104  2. Are you having any other symptoms (ex. Dizziness, headache, blurred vision, passed out)?  Dizzy and lightheaded  3. What is your BP issue? High blood pressure

## 2021-07-16 NOTE — Telephone Encounter (Signed)
Spoke with patient and she reported blood pressure at 11:20 am was 192/108 and at 12:00 it was 139/117. She stated she has some dizziness but no other symptoms. She requested that I call her daughter to review information.  Called and spoke with patients daughter and she would like to know what pulse rates should be, concerns with it being elevated. Her DBP is running in the 100's.   She reports that she has been fluctuating and concerned. Advised that I would send this over to provider for review and give them a call back with recommendations. She was agreeable with no further questions at this time.

## 2021-07-17 DIAGNOSIS — I480 Paroxysmal atrial fibrillation: Secondary | ICD-10-CM | POA: Diagnosis not present

## 2021-07-18 ENCOUNTER — Telehealth: Payer: Self-pay | Admitting: Student

## 2021-07-18 DIAGNOSIS — I480 Paroxysmal atrial fibrillation: Secondary | ICD-10-CM | POA: Diagnosis not present

## 2021-07-18 NOTE — Telephone Encounter (Signed)
   Cardiac Monitor Alert  Date of alert:  07/18/2021   Patient Name: Jessica Kaufman  DOB: Apr 16, 1925  MRN: [id number]   [facility] Cardiologist: Nelva Bush, MD   Monitor Information: Long Term Monitor-Live Telemetry [ZioAT]  Reason: Syncope Ordering provider:  Christell Faith, PA-C  Alert Atrial Fibrillation/Flutter This is the 1st alert for this rhythm.  The patient has a hx of Atrial Fibrillation/Flutter.  The patient is currently on anticoagulation.  Anticoagulation medication as of 07/18/2021           apixaban (ELIQUIS) 5 MG TABS tablet Take 5 mg by mouth 2 (two) times daily.        Received a call the patient had 90 seconds of atrial fibrillation with RVR earlier this morning.  By review of her chart, she has a history of known paroxysmal atrial fibrillation and is already on anticoagulation. The strips will be faxed to the office for review.  Erma Heritage, PA-C  07/18/2021 9:27 AM

## 2021-07-18 NOTE — Telephone Encounter (Signed)
Noted. Await final review to quantify burden.

## 2021-07-20 DIAGNOSIS — H6123 Impacted cerumen, bilateral: Secondary | ICD-10-CM | POA: Diagnosis not present

## 2021-07-20 DIAGNOSIS — E559 Vitamin D deficiency, unspecified: Secondary | ICD-10-CM | POA: Diagnosis not present

## 2021-07-20 DIAGNOSIS — M109 Gout, unspecified: Secondary | ICD-10-CM | POA: Diagnosis not present

## 2021-07-20 DIAGNOSIS — E7800 Pure hypercholesterolemia, unspecified: Secondary | ICD-10-CM | POA: Diagnosis not present

## 2021-07-20 DIAGNOSIS — I7121 Aneurysm of the ascending aorta, without rupture: Secondary | ICD-10-CM | POA: Diagnosis not present

## 2021-07-20 DIAGNOSIS — I5032 Chronic diastolic (congestive) heart failure: Secondary | ICD-10-CM | POA: Diagnosis not present

## 2021-07-20 DIAGNOSIS — G319 Degenerative disease of nervous system, unspecified: Secondary | ICD-10-CM | POA: Diagnosis not present

## 2021-07-20 DIAGNOSIS — G4733 Obstructive sleep apnea (adult) (pediatric): Secondary | ICD-10-CM | POA: Diagnosis not present

## 2021-07-20 DIAGNOSIS — N1832 Chronic kidney disease, stage 3b: Secondary | ICD-10-CM | POA: Diagnosis not present

## 2021-07-20 DIAGNOSIS — R32 Unspecified urinary incontinence: Secondary | ICD-10-CM | POA: Diagnosis not present

## 2021-07-20 DIAGNOSIS — G629 Polyneuropathy, unspecified: Secondary | ICD-10-CM | POA: Diagnosis not present

## 2021-07-20 DIAGNOSIS — I350 Nonrheumatic aortic (valve) stenosis: Secondary | ICD-10-CM | POA: Diagnosis not present

## 2021-07-20 DIAGNOSIS — K219 Gastro-esophageal reflux disease without esophagitis: Secondary | ICD-10-CM | POA: Diagnosis not present

## 2021-07-20 DIAGNOSIS — M069 Rheumatoid arthritis, unspecified: Secondary | ICD-10-CM | POA: Diagnosis not present

## 2021-07-20 DIAGNOSIS — I130 Hypertensive heart and chronic kidney disease with heart failure and stage 1 through stage 4 chronic kidney disease, or unspecified chronic kidney disease: Secondary | ICD-10-CM | POA: Diagnosis not present

## 2021-07-20 DIAGNOSIS — I471 Supraventricular tachycardia: Secondary | ICD-10-CM | POA: Diagnosis not present

## 2021-07-20 DIAGNOSIS — Z9181 History of falling: Secondary | ICD-10-CM | POA: Diagnosis not present

## 2021-07-20 DIAGNOSIS — E039 Hypothyroidism, unspecified: Secondary | ICD-10-CM | POA: Diagnosis not present

## 2021-07-20 DIAGNOSIS — I480 Paroxysmal atrial fibrillation: Secondary | ICD-10-CM | POA: Diagnosis not present

## 2021-07-20 DIAGNOSIS — H3530 Unspecified macular degeneration: Secondary | ICD-10-CM | POA: Diagnosis not present

## 2021-07-20 DIAGNOSIS — Z8673 Personal history of transient ischemic attack (TIA), and cerebral infarction without residual deficits: Secondary | ICD-10-CM | POA: Diagnosis not present

## 2021-07-20 DIAGNOSIS — R42 Dizziness and giddiness: Secondary | ICD-10-CM | POA: Diagnosis not present

## 2021-07-20 DIAGNOSIS — M810 Age-related osteoporosis without current pathological fracture: Secondary | ICD-10-CM | POA: Diagnosis not present

## 2021-07-20 DIAGNOSIS — M170 Bilateral primary osteoarthritis of knee: Secondary | ICD-10-CM | POA: Diagnosis not present

## 2021-07-27 ENCOUNTER — Other Ambulatory Visit: Payer: Self-pay | Admitting: Internal Medicine

## 2021-07-27 DIAGNOSIS — R600 Localized edema: Secondary | ICD-10-CM | POA: Diagnosis not present

## 2021-07-27 DIAGNOSIS — I5032 Chronic diastolic (congestive) heart failure: Secondary | ICD-10-CM | POA: Diagnosis not present

## 2021-07-28 DIAGNOSIS — Z8673 Personal history of transient ischemic attack (TIA), and cerebral infarction without residual deficits: Secondary | ICD-10-CM | POA: Diagnosis not present

## 2021-07-28 DIAGNOSIS — G4733 Obstructive sleep apnea (adult) (pediatric): Secondary | ICD-10-CM | POA: Diagnosis not present

## 2021-07-28 DIAGNOSIS — M109 Gout, unspecified: Secondary | ICD-10-CM | POA: Diagnosis not present

## 2021-07-28 DIAGNOSIS — H6123 Impacted cerumen, bilateral: Secondary | ICD-10-CM | POA: Diagnosis not present

## 2021-07-28 DIAGNOSIS — R32 Unspecified urinary incontinence: Secondary | ICD-10-CM | POA: Diagnosis not present

## 2021-07-28 DIAGNOSIS — H3530 Unspecified macular degeneration: Secondary | ICD-10-CM | POA: Diagnosis not present

## 2021-07-28 DIAGNOSIS — I130 Hypertensive heart and chronic kidney disease with heart failure and stage 1 through stage 4 chronic kidney disease, or unspecified chronic kidney disease: Secondary | ICD-10-CM | POA: Diagnosis not present

## 2021-07-28 DIAGNOSIS — I7121 Aneurysm of the ascending aorta, without rupture: Secondary | ICD-10-CM | POA: Diagnosis not present

## 2021-07-28 DIAGNOSIS — G319 Degenerative disease of nervous system, unspecified: Secondary | ICD-10-CM | POA: Diagnosis not present

## 2021-07-28 DIAGNOSIS — E559 Vitamin D deficiency, unspecified: Secondary | ICD-10-CM | POA: Diagnosis not present

## 2021-07-28 DIAGNOSIS — M810 Age-related osteoporosis without current pathological fracture: Secondary | ICD-10-CM | POA: Diagnosis not present

## 2021-07-28 DIAGNOSIS — K219 Gastro-esophageal reflux disease without esophagitis: Secondary | ICD-10-CM | POA: Diagnosis not present

## 2021-07-28 DIAGNOSIS — G629 Polyneuropathy, unspecified: Secondary | ICD-10-CM | POA: Diagnosis not present

## 2021-07-28 DIAGNOSIS — E7800 Pure hypercholesterolemia, unspecified: Secondary | ICD-10-CM | POA: Diagnosis not present

## 2021-07-28 DIAGNOSIS — I5032 Chronic diastolic (congestive) heart failure: Secondary | ICD-10-CM | POA: Diagnosis not present

## 2021-07-28 DIAGNOSIS — N1832 Chronic kidney disease, stage 3b: Secondary | ICD-10-CM | POA: Diagnosis not present

## 2021-07-28 DIAGNOSIS — I350 Nonrheumatic aortic (valve) stenosis: Secondary | ICD-10-CM | POA: Diagnosis not present

## 2021-07-28 DIAGNOSIS — I480 Paroxysmal atrial fibrillation: Secondary | ICD-10-CM | POA: Diagnosis not present

## 2021-07-28 DIAGNOSIS — M170 Bilateral primary osteoarthritis of knee: Secondary | ICD-10-CM | POA: Diagnosis not present

## 2021-07-28 DIAGNOSIS — M069 Rheumatoid arthritis, unspecified: Secondary | ICD-10-CM | POA: Diagnosis not present

## 2021-07-28 DIAGNOSIS — I471 Supraventricular tachycardia: Secondary | ICD-10-CM | POA: Diagnosis not present

## 2021-07-28 DIAGNOSIS — Z9181 History of falling: Secondary | ICD-10-CM | POA: Diagnosis not present

## 2021-07-28 DIAGNOSIS — R42 Dizziness and giddiness: Secondary | ICD-10-CM | POA: Diagnosis not present

## 2021-07-28 DIAGNOSIS — E039 Hypothyroidism, unspecified: Secondary | ICD-10-CM | POA: Diagnosis not present

## 2021-08-02 DIAGNOSIS — M109 Gout, unspecified: Secondary | ICD-10-CM | POA: Diagnosis not present

## 2021-08-02 DIAGNOSIS — I350 Nonrheumatic aortic (valve) stenosis: Secondary | ICD-10-CM | POA: Diagnosis not present

## 2021-08-02 DIAGNOSIS — E7800 Pure hypercholesterolemia, unspecified: Secondary | ICD-10-CM | POA: Diagnosis not present

## 2021-08-02 DIAGNOSIS — G629 Polyneuropathy, unspecified: Secondary | ICD-10-CM | POA: Diagnosis not present

## 2021-08-02 DIAGNOSIS — K219 Gastro-esophageal reflux disease without esophagitis: Secondary | ICD-10-CM | POA: Diagnosis not present

## 2021-08-02 DIAGNOSIS — I7121 Aneurysm of the ascending aorta, without rupture: Secondary | ICD-10-CM | POA: Diagnosis not present

## 2021-08-02 DIAGNOSIS — I130 Hypertensive heart and chronic kidney disease with heart failure and stage 1 through stage 4 chronic kidney disease, or unspecified chronic kidney disease: Secondary | ICD-10-CM | POA: Diagnosis not present

## 2021-08-02 DIAGNOSIS — M810 Age-related osteoporosis without current pathological fracture: Secondary | ICD-10-CM | POA: Diagnosis not present

## 2021-08-02 DIAGNOSIS — G319 Degenerative disease of nervous system, unspecified: Secondary | ICD-10-CM | POA: Diagnosis not present

## 2021-08-02 DIAGNOSIS — M170 Bilateral primary osteoarthritis of knee: Secondary | ICD-10-CM | POA: Diagnosis not present

## 2021-08-02 DIAGNOSIS — G4733 Obstructive sleep apnea (adult) (pediatric): Secondary | ICD-10-CM | POA: Diagnosis not present

## 2021-08-02 DIAGNOSIS — E039 Hypothyroidism, unspecified: Secondary | ICD-10-CM | POA: Diagnosis not present

## 2021-08-02 DIAGNOSIS — R42 Dizziness and giddiness: Secondary | ICD-10-CM | POA: Diagnosis not present

## 2021-08-02 DIAGNOSIS — M069 Rheumatoid arthritis, unspecified: Secondary | ICD-10-CM | POA: Diagnosis not present

## 2021-08-02 DIAGNOSIS — Z8673 Personal history of transient ischemic attack (TIA), and cerebral infarction without residual deficits: Secondary | ICD-10-CM | POA: Diagnosis not present

## 2021-08-02 DIAGNOSIS — H6123 Impacted cerumen, bilateral: Secondary | ICD-10-CM | POA: Diagnosis not present

## 2021-08-02 DIAGNOSIS — Z9181 History of falling: Secondary | ICD-10-CM | POA: Diagnosis not present

## 2021-08-02 DIAGNOSIS — I5032 Chronic diastolic (congestive) heart failure: Secondary | ICD-10-CM | POA: Diagnosis not present

## 2021-08-02 DIAGNOSIS — H3530 Unspecified macular degeneration: Secondary | ICD-10-CM | POA: Diagnosis not present

## 2021-08-02 DIAGNOSIS — I480 Paroxysmal atrial fibrillation: Secondary | ICD-10-CM | POA: Diagnosis not present

## 2021-08-02 DIAGNOSIS — R32 Unspecified urinary incontinence: Secondary | ICD-10-CM | POA: Diagnosis not present

## 2021-08-02 DIAGNOSIS — I471 Supraventricular tachycardia: Secondary | ICD-10-CM | POA: Diagnosis not present

## 2021-08-02 DIAGNOSIS — E559 Vitamin D deficiency, unspecified: Secondary | ICD-10-CM | POA: Diagnosis not present

## 2021-08-02 DIAGNOSIS — N1832 Chronic kidney disease, stage 3b: Secondary | ICD-10-CM | POA: Diagnosis not present

## 2021-08-06 DIAGNOSIS — I119 Hypertensive heart disease without heart failure: Secondary | ICD-10-CM | POA: Diagnosis not present

## 2021-08-06 DIAGNOSIS — I480 Paroxysmal atrial fibrillation: Secondary | ICD-10-CM | POA: Diagnosis not present

## 2021-08-06 DIAGNOSIS — H353290 Exudative age-related macular degeneration, unspecified eye, stage unspecified: Secondary | ICD-10-CM | POA: Diagnosis not present

## 2021-08-06 DIAGNOSIS — Z6841 Body Mass Index (BMI) 40.0 and over, adult: Secondary | ICD-10-CM | POA: Diagnosis not present

## 2021-08-06 DIAGNOSIS — I350 Nonrheumatic aortic (valve) stenosis: Secondary | ICD-10-CM | POA: Diagnosis not present

## 2021-08-06 DIAGNOSIS — G510 Bell's palsy: Secondary | ICD-10-CM | POA: Diagnosis not present

## 2021-08-06 DIAGNOSIS — Z515 Encounter for palliative care: Secondary | ICD-10-CM | POA: Diagnosis not present

## 2021-08-06 DIAGNOSIS — D692 Other nonthrombocytopenic purpura: Secondary | ICD-10-CM | POA: Diagnosis not present

## 2021-08-06 DIAGNOSIS — I8390 Asymptomatic varicose veins of unspecified lower extremity: Secondary | ICD-10-CM | POA: Diagnosis not present

## 2021-08-06 DIAGNOSIS — M0600 Rheumatoid arthritis without rheumatoid factor, unspecified site: Secondary | ICD-10-CM | POA: Diagnosis not present

## 2021-08-06 DIAGNOSIS — J4540 Moderate persistent asthma, uncomplicated: Secondary | ICD-10-CM | POA: Diagnosis not present

## 2021-08-09 DIAGNOSIS — G629 Polyneuropathy, unspecified: Secondary | ICD-10-CM | POA: Diagnosis not present

## 2021-08-09 DIAGNOSIS — I471 Supraventricular tachycardia: Secondary | ICD-10-CM | POA: Diagnosis not present

## 2021-08-09 DIAGNOSIS — M069 Rheumatoid arthritis, unspecified: Secondary | ICD-10-CM | POA: Diagnosis not present

## 2021-08-09 DIAGNOSIS — Z9181 History of falling: Secondary | ICD-10-CM | POA: Diagnosis not present

## 2021-08-09 DIAGNOSIS — E039 Hypothyroidism, unspecified: Secondary | ICD-10-CM | POA: Diagnosis not present

## 2021-08-09 DIAGNOSIS — G319 Degenerative disease of nervous system, unspecified: Secondary | ICD-10-CM | POA: Diagnosis not present

## 2021-08-09 DIAGNOSIS — M810 Age-related osteoporosis without current pathological fracture: Secondary | ICD-10-CM | POA: Diagnosis not present

## 2021-08-09 DIAGNOSIS — G4733 Obstructive sleep apnea (adult) (pediatric): Secondary | ICD-10-CM | POA: Diagnosis not present

## 2021-08-09 DIAGNOSIS — E7800 Pure hypercholesterolemia, unspecified: Secondary | ICD-10-CM | POA: Diagnosis not present

## 2021-08-09 DIAGNOSIS — K219 Gastro-esophageal reflux disease without esophagitis: Secondary | ICD-10-CM | POA: Diagnosis not present

## 2021-08-09 DIAGNOSIS — R32 Unspecified urinary incontinence: Secondary | ICD-10-CM | POA: Diagnosis not present

## 2021-08-09 DIAGNOSIS — R42 Dizziness and giddiness: Secondary | ICD-10-CM | POA: Diagnosis not present

## 2021-08-09 DIAGNOSIS — I7121 Aneurysm of the ascending aorta, without rupture: Secondary | ICD-10-CM | POA: Diagnosis not present

## 2021-08-09 DIAGNOSIS — H3530 Unspecified macular degeneration: Secondary | ICD-10-CM | POA: Diagnosis not present

## 2021-08-09 DIAGNOSIS — Z8673 Personal history of transient ischemic attack (TIA), and cerebral infarction without residual deficits: Secondary | ICD-10-CM | POA: Diagnosis not present

## 2021-08-09 DIAGNOSIS — H6123 Impacted cerumen, bilateral: Secondary | ICD-10-CM | POA: Diagnosis not present

## 2021-08-09 DIAGNOSIS — N1832 Chronic kidney disease, stage 3b: Secondary | ICD-10-CM | POA: Diagnosis not present

## 2021-08-09 DIAGNOSIS — I5032 Chronic diastolic (congestive) heart failure: Secondary | ICD-10-CM | POA: Diagnosis not present

## 2021-08-09 DIAGNOSIS — I130 Hypertensive heart and chronic kidney disease with heart failure and stage 1 through stage 4 chronic kidney disease, or unspecified chronic kidney disease: Secondary | ICD-10-CM | POA: Diagnosis not present

## 2021-08-09 DIAGNOSIS — I350 Nonrheumatic aortic (valve) stenosis: Secondary | ICD-10-CM | POA: Diagnosis not present

## 2021-08-09 DIAGNOSIS — E559 Vitamin D deficiency, unspecified: Secondary | ICD-10-CM | POA: Diagnosis not present

## 2021-08-09 DIAGNOSIS — M170 Bilateral primary osteoarthritis of knee: Secondary | ICD-10-CM | POA: Diagnosis not present

## 2021-08-09 DIAGNOSIS — I480 Paroxysmal atrial fibrillation: Secondary | ICD-10-CM | POA: Diagnosis not present

## 2021-08-09 DIAGNOSIS — M109 Gout, unspecified: Secondary | ICD-10-CM | POA: Diagnosis not present

## 2021-08-16 ENCOUNTER — Telehealth: Payer: Self-pay | Admitting: *Deleted

## 2021-08-16 DIAGNOSIS — I480 Paroxysmal atrial fibrillation: Secondary | ICD-10-CM

## 2021-08-16 DIAGNOSIS — R55 Syncope and collapse: Secondary | ICD-10-CM

## 2021-08-16 NOTE — Telephone Encounter (Signed)
-----   Message from Theora Gianotti, NP sent at 08/16/2021 11:41 AM EDT ----- Predominantly sinus/normal rhythm with an average rate of 66 bpm (normal).  She did have afib occurring 24% of the time that she was monitored, however.  Longest episode of afib lasted 15.5 hrs.  Up to 3.3 second pauses noted following episodes of afib.  128 brief runs of fast heart rhythms stemming from the top chambers (not afib).  Triggered events corresponded with afib and extra beats from the bottom chambers.  With recent passing out spell and post-termination pauses, rec EP eval.

## 2021-08-16 NOTE — Telephone Encounter (Signed)
Reviewed results with patients daughter per release form and scheduled her to see Dr. Caryl Comes with soonest appointment over in [place] office. She was agreeable with plan and had no further questions at this time.

## 2021-08-16 NOTE — Telephone Encounter (Signed)
-----   Message from Nelva Bush, MD sent at 08/14/2021 10:58 AM EDT ----- Regarding: Monitor Results I have reviewed Ms. Ware' recent monitor, which shows PAF and post-termination pauses of up to 3.3 seconds.  Findings are concerning for sinus node dysfunction leading to recent syncope.  I recommend that we arrange for expedited EP consultation.  Nelva Bush, MD [facility]

## 2021-08-23 ENCOUNTER — Telehealth: Payer: Self-pay

## 2021-08-23 DIAGNOSIS — I480 Paroxysmal atrial fibrillation: Secondary | ICD-10-CM | POA: Diagnosis not present

## 2021-08-23 DIAGNOSIS — G4733 Obstructive sleep apnea (adult) (pediatric): Secondary | ICD-10-CM | POA: Diagnosis not present

## 2021-08-23 DIAGNOSIS — G319 Degenerative disease of nervous system, unspecified: Secondary | ICD-10-CM | POA: Diagnosis not present

## 2021-08-23 DIAGNOSIS — I350 Nonrheumatic aortic (valve) stenosis: Secondary | ICD-10-CM | POA: Diagnosis not present

## 2021-08-23 DIAGNOSIS — K219 Gastro-esophageal reflux disease without esophagitis: Secondary | ICD-10-CM | POA: Diagnosis not present

## 2021-08-23 DIAGNOSIS — E7800 Pure hypercholesterolemia, unspecified: Secondary | ICD-10-CM | POA: Diagnosis not present

## 2021-08-23 DIAGNOSIS — H3530 Unspecified macular degeneration: Secondary | ICD-10-CM | POA: Diagnosis not present

## 2021-08-23 DIAGNOSIS — M069 Rheumatoid arthritis, unspecified: Secondary | ICD-10-CM | POA: Diagnosis not present

## 2021-08-23 DIAGNOSIS — R42 Dizziness and giddiness: Secondary | ICD-10-CM | POA: Diagnosis not present

## 2021-08-23 DIAGNOSIS — M810 Age-related osteoporosis without current pathological fracture: Secondary | ICD-10-CM | POA: Diagnosis not present

## 2021-08-23 DIAGNOSIS — R32 Unspecified urinary incontinence: Secondary | ICD-10-CM | POA: Diagnosis not present

## 2021-08-23 DIAGNOSIS — E039 Hypothyroidism, unspecified: Secondary | ICD-10-CM | POA: Diagnosis not present

## 2021-08-23 DIAGNOSIS — I130 Hypertensive heart and chronic kidney disease with heart failure and stage 1 through stage 4 chronic kidney disease, or unspecified chronic kidney disease: Secondary | ICD-10-CM | POA: Diagnosis not present

## 2021-08-23 DIAGNOSIS — M170 Bilateral primary osteoarthritis of knee: Secondary | ICD-10-CM | POA: Diagnosis not present

## 2021-08-23 DIAGNOSIS — M109 Gout, unspecified: Secondary | ICD-10-CM | POA: Diagnosis not present

## 2021-08-23 DIAGNOSIS — E559 Vitamin D deficiency, unspecified: Secondary | ICD-10-CM | POA: Diagnosis not present

## 2021-08-23 DIAGNOSIS — H6123 Impacted cerumen, bilateral: Secondary | ICD-10-CM | POA: Diagnosis not present

## 2021-08-23 DIAGNOSIS — Z9181 History of falling: Secondary | ICD-10-CM | POA: Diagnosis not present

## 2021-08-23 DIAGNOSIS — I5032 Chronic diastolic (congestive) heart failure: Secondary | ICD-10-CM | POA: Diagnosis not present

## 2021-08-23 DIAGNOSIS — I7121 Aneurysm of the ascending aorta, without rupture: Secondary | ICD-10-CM | POA: Diagnosis not present

## 2021-08-23 DIAGNOSIS — G629 Polyneuropathy, unspecified: Secondary | ICD-10-CM | POA: Diagnosis not present

## 2021-08-23 DIAGNOSIS — Z8673 Personal history of transient ischemic attack (TIA), and cerebral infarction without residual deficits: Secondary | ICD-10-CM | POA: Diagnosis not present

## 2021-08-23 DIAGNOSIS — I471 Supraventricular tachycardia: Secondary | ICD-10-CM | POA: Diagnosis not present

## 2021-08-23 DIAGNOSIS — N1832 Chronic kidney disease, stage 3b: Secondary | ICD-10-CM | POA: Diagnosis not present

## 2021-08-23 MED ORDER — LOSARTAN POTASSIUM 50 MG PO TABS: 50.0000 mg | ORAL_TABLET | Freq: Two times a day (BID) | ORAL | 2 refills | Status: DC

## 2021-08-23 NOTE — Telephone Encounter (Signed)
Refill request Losartan Potassiom 50 MG

## 2021-08-26 DIAGNOSIS — Z79899 Other long term (current) drug therapy: Secondary | ICD-10-CM | POA: Diagnosis not present

## 2021-08-26 DIAGNOSIS — I471 Supraventricular tachycardia: Secondary | ICD-10-CM | POA: Diagnosis not present

## 2021-08-26 DIAGNOSIS — M109 Gout, unspecified: Secondary | ICD-10-CM | POA: Diagnosis not present

## 2021-08-26 DIAGNOSIS — R32 Unspecified urinary incontinence: Secondary | ICD-10-CM | POA: Diagnosis not present

## 2021-08-26 DIAGNOSIS — M069 Rheumatoid arthritis, unspecified: Secondary | ICD-10-CM | POA: Diagnosis not present

## 2021-08-26 DIAGNOSIS — Z139 Encounter for screening, unspecified: Secondary | ICD-10-CM | POA: Diagnosis not present

## 2021-08-26 DIAGNOSIS — E039 Hypothyroidism, unspecified: Secondary | ICD-10-CM | POA: Diagnosis not present

## 2021-08-26 DIAGNOSIS — Z9181 History of falling: Secondary | ICD-10-CM | POA: Diagnosis not present

## 2021-08-26 DIAGNOSIS — Z1331 Encounter for screening for depression: Secondary | ICD-10-CM | POA: Diagnosis not present

## 2021-08-26 DIAGNOSIS — M79604 Pain in right leg: Secondary | ICD-10-CM | POA: Diagnosis not present

## 2021-08-26 DIAGNOSIS — H1011 Acute atopic conjunctivitis, right eye: Secondary | ICD-10-CM | POA: Diagnosis not present

## 2021-08-26 DIAGNOSIS — I480 Paroxysmal atrial fibrillation: Secondary | ICD-10-CM | POA: Diagnosis not present

## 2021-08-26 DIAGNOSIS — G319 Degenerative disease of nervous system, unspecified: Secondary | ICD-10-CM | POA: Diagnosis not present

## 2021-08-27 DIAGNOSIS — R600 Localized edema: Secondary | ICD-10-CM | POA: Diagnosis not present

## 2021-08-27 DIAGNOSIS — I5032 Chronic diastolic (congestive) heart failure: Secondary | ICD-10-CM | POA: Diagnosis not present

## 2021-08-31 ENCOUNTER — Ambulatory Visit: Payer: PPO | Admitting: Pulmonary Disease

## 2021-08-31 ENCOUNTER — Encounter: Payer: Self-pay | Admitting: Pulmonary Disease

## 2021-08-31 VITALS — BP 124/80 | HR 64 | Temp 98.0°F | Ht 63.0 in | Wt 218.6 lb

## 2021-08-31 DIAGNOSIS — I350 Nonrheumatic aortic (valve) stenosis: Secondary | ICD-10-CM

## 2021-08-31 DIAGNOSIS — J4540 Moderate persistent asthma, uncomplicated: Secondary | ICD-10-CM | POA: Diagnosis not present

## 2021-08-31 DIAGNOSIS — R053 Chronic cough: Secondary | ICD-10-CM

## 2021-08-31 DIAGNOSIS — M059 Rheumatoid arthritis with rheumatoid factor, unspecified: Secondary | ICD-10-CM

## 2021-08-31 MED ORDER — TRELEGY ELLIPTA 100-62.5-25 MCG/ACT IN AEPB: 1.0000 | INHALATION_SPRAY | Freq: Every day | RESPIRATORY_TRACT | 0 refills | Status: DC

## 2021-08-31 NOTE — Patient Instructions (Signed)
Continue taking your Trelegy 1 puff daily.  Use your albuterol when you get into the coughing spells.  It may help you clear your mucus better.  Take Mucinex twice a day with plenty of water.  We will see you in follow-up in 6 to 8 weeks time call sooner should any new problems arise.

## 2021-08-31 NOTE — Progress Notes (Signed)
Subjective:    Patient ID: Jessica Kaufman, female    DOB: 03-20-1925, 88 y.o.   MRN: [id number] Patient Care Team: Leonides Sake, MD as PCP - General (Family Medicine) End, Harrell Gave, MD as PCP - Cardiology (Cardiology)  Chief Complaint  Patient presents with   Follow-up   HPI [name] is a 88 year old lifelong never smoker who presents for follow-up on the issue of asthma and asthmatic bronchitis.  She was last seen here on 16 December 2020.  At that time she was noted to have good control of her symptoms with Trelegy Ellipta 100.  She had been doing well until she had a minor exacerbation in June after an upper respiratory tract infection.  She also developed issues with Bell's palsy has some facial droop still persistent on her right side.  She was seen in the emergency room for this issue.  He was treated with steroids. She developed a dry hacking cough after that time.  This is now improved considerably.  After the episode with Bell's palsy, she developed an episode with atrial fibrillation with RVR.  This presented by her having an issue of syncope while eating breakfast at her assisted living.  Patient was also noted to be hypotensive at that time.  After volume repletion, her blood pressures stabilized and the patient converted spontaneously to normal sinus rhythm.  She never developed respiratory distress with those issues.  As noted, her cough is now for the most part resolved.  Not had any fevers, chills or sweats.  No tachypalpitations since her episode earlier on in July.  She has not had any orthopnea.  She has been having issues with lower extremity edema but this has improved with furosemide twice daily.  She states that she feels she is slowly improving. Has not had any shortness of breath.  Occasionally has tenacious sputum, Mucinex helps with this. She does not endorse any other symptomatology today.     Review of Systems A 10 point review of systems was performed and it  is as noted above otherwise negative.  Patient Active Problem List   Diagnosis Date Noted   Hypertension    Asthma    Chronic heart failure with preserved ejection fraction (HFpEF) ([place]) 03/11/2021   Edema 11/05/2020   Atrial fibrillation with RVR ([place]) 05/07/2020   Gout    Hypothyroidism    Rheumatoid arthritis (HCC)    Syncope    Elevated troponin    Hypomagnesemia    Atypical chest pain 10/03/2019   Moderate aortic stenosis 10/03/2019   Palpitations 04/04/2019   Dizziness 04/04/2019   PSVT (paroxysmal supraventricular tachycardia) ([place]) 04/04/2019   Near syncope 02/20/2019   Labile hypertension 12/12/2017   Paroxysmal atrial fibrillation (HCC) 12/12/2017   Aortic valve disease 12/12/2017   Social History   Tobacco Use   Smoking status: Never   Smokeless tobacco: Never  Substance Use Topics   Alcohol use: No   Allergies  Allergen Reactions   Metronidazole Other (See Comments)    RASH   Sulindac Other (See Comments)    UNKNOWN   Naproxen Rash   Current Meds  Medication Sig   acetaminophen (TYLENOL) 650 MG CR tablet Take 650 mg by mouth every 8 (eight) hours as needed for pain.   apixaban (ELIQUIS) 5 MG TABS tablet Take 5 mg by mouth 2 (two) times daily.    b complex vitamins tablet Take 1 tablet by mouth daily.   Bioflavonoid Products (BIOFLEX) TABS Take 2 tablets  by mouth daily.   carvedilol (COREG) 3.125 MG tablet Take 1 tablet by mouth twice a day with a meal   Cholecalciferol (VITAMIN D3) 1000 UNITS CAPS Take 1 capsule by mouth daily.   Coenzyme Q10 (COQ10) 100 MG CAPS Take 1 capsule by mouth daily.   CRANBERRY PO Take 1 tablet by mouth daily.   famotidine (PEPCID) 20 MG tablet Take 20 mg by mouth daily.   fexofenadine (ALLEGRA) 180 MG tablet Take 180 mg by mouth daily.   Fluticasone-Umeclidin-Vilant (TRELEGY ELLIPTA) 100-62.5-25 MCG/ACT AEPB Inhale 100 mcg into the lungs daily.   furosemide (LASIX) 40 MG tablet Take 0.5 tablets (20 mg total) by mouth as  directed. Take half a tablet (20 mg) once daily   gabapentin (NEURONTIN) 600 MG tablet Take 600 mg by mouth 2 (two) times daily.   Ginger, Zingiber officinalis, (GINGER ROOT PO) Take by mouth daily.   guaiFENesin (MUCINEX) 600 MG 12 hr tablet Take 600 mg by mouth daily.   hydrALAZINE (APRESOLINE) 25 MG tablet TAKE 1 TABLET BY MOUTH ONCE A DAY AND TWICE A DAY AS NEEDED FOR SYSTOLIC BP OF 751 OR GREATER   levothyroxine (SYNTHROID, LEVOTHROID) 50 MCG tablet Take 1 tablet by mouth daily.   losartan (COZAAR) 50 MG tablet Take 1 tablet (50 mg total) by mouth 2 (two) times daily.   Multiple Vitamins-Minerals (CENTRUM SILVER PO) Take 1 tablet by mouth daily.   Multiple Vitamins-Minerals (PRESERVISION AREDS 2 PO) Take 1 capsule by mouth 2 (two) times daily.    Omega-3 Fatty Acids (FISH OIL) 1000 MG CAPS Take 2 capsules by mouth daily.   PREMARIN vaginal cream Place 1 Applicatorful vaginally in the morning and at bedtime.    psyllium (METAMUCIL) 58.6 % powder Take 1 packet by mouth daily.   Pyridoxine HCl (VITAMIN B6 PO) Take 1,000 mg by mouth daily.   TURMERIC PO Take by mouth daily.   Immunization History  Administered Date(s) Administered   Influenza, High Dose Seasonal PF 10/25/2018   Influenza, Quadrivalent, Recombinant, Inj, Pf 12/01/2020   Moderna SARS-COV2 Booster Vaccination 01/08/2020   Moderna Sars-Covid-2 Vaccination 01/19/2019, 02/15/2019   Tdap 03/19/2021   Zoster Recombinat (Shingrix) 05/17/2017, 07/24/2017       Objective:   Physical Exam BP 124/80 (BP Location: Right Arm, Patient Position: Sitting, Cuff Size: Large)   Pulse 64   Temp 98 F (36.7 C) (Oral)   Ht '5\' 3"'$  (1.6 m)   Wt 218 lb 9.6 oz (99.2 kg)   SpO2 94%   BMI 38.72 kg/m  GENERAL: Obese woman, no acute distress.  No conversational dyspnea.  Ambulates with walker. HEAD: Normocephalic, atraumatic.  Slight right facial droop noted from Bell's palsy. EYES: Pupils equal, round, reactive to light.  No scleral icterus.   MOUTH: Nose/mouth/throat not examined due to masking requirements for COVID 19. NECK: Supple. No thyromegaly. Trachea midline. No JVD.  No adenopathy. PULMONARY: Good air entry bilaterally.  No adventitious sounds. CARDIOVASCULAR: S1 and S2. Regular rate and rhythm.  Grade 2/6 to 3/6 holosystolic murmur at right sternal border consistent with AS. ABDOMEN: Obese, otherwise benign. MUSCULOSKELETAL: No joint deformity, no clubbing, no edema.  NEUROLOGIC: Right facial droop noted (Bell's palsy), speech is fluent. SKIN: Intact,warm,dry.  On limited exam, no rashes. PSYCH: Mood and behavior normal.      Assessment & Plan:     ICD-10-CM   1. Moderate persistent asthmatic bronchitis without complication  W25.85    Continue Trelegy 100, 1 inhalation daily Continue as  needed albuterol    2. Chronic cough  R05.3    Recent exacerbation likely due to postinfectious cough Resolving    3. Seropositive rheumatoid arthritis ([place])  M05.9    This issue adds complexity to her management Follows with rheumatology    4. Aortic valve stenosis, etiology of cardiac valve disease unspecified  I35.0    Noted to be moderate Adds complexity to her management Follows with cardiology     Meds ordered this encounter  Medications   Fluticasone-Umeclidin-Vilant (TRELEGY ELLIPTA) 100-62.5-25 MCG/ACT AEPB    Sig: Inhale 1 puff into the lungs daily.    Dispense:  2 each    Refill:  0    Order Specific Question:   Lot Number?    Answer:   Danie Binder    Order Specific Question:   Manufacturer?    Answer:   [employer] [[id number]]    Order Specific Question:   Quantity    Answer:   2   We will see the patient in follow-up in 6 to 8 weeks time call sooner should any new problems arise.  Renold Don, MD Advanced Bronchoscopy PCCM [facility]    *This note was dictated using voice recognition software/Dragon.  Despite best efforts to proofread, errors can occur which can change the  meaning. Any transcriptional errors that result from this process are unintentional and may not be fully corrected at the time of dictation.

## 2021-09-01 DIAGNOSIS — M25561 Pain in right knee: Secondary | ICD-10-CM | POA: Diagnosis not present

## 2021-09-01 DIAGNOSIS — G629 Polyneuropathy, unspecified: Secondary | ICD-10-CM | POA: Diagnosis not present

## 2021-09-01 DIAGNOSIS — Z79899 Other long term (current) drug therapy: Secondary | ICD-10-CM | POA: Diagnosis not present

## 2021-09-01 DIAGNOSIS — M1990 Unspecified osteoarthritis, unspecified site: Secondary | ICD-10-CM | POA: Diagnosis not present

## 2021-09-01 DIAGNOSIS — E669 Obesity, unspecified: Secondary | ICD-10-CM | POA: Diagnosis not present

## 2021-09-01 DIAGNOSIS — M109 Gout, unspecified: Secondary | ICD-10-CM | POA: Diagnosis not present

## 2021-09-01 DIAGNOSIS — M0589 Other rheumatoid arthritis with rheumatoid factor of multiple sites: Secondary | ICD-10-CM | POA: Diagnosis not present

## 2021-09-03 ENCOUNTER — Encounter: Payer: Self-pay | Admitting: Pulmonary Disease

## 2021-09-04 DIAGNOSIS — M069 Rheumatoid arthritis, unspecified: Secondary | ICD-10-CM | POA: Diagnosis not present

## 2021-09-04 DIAGNOSIS — I350 Nonrheumatic aortic (valve) stenosis: Secondary | ICD-10-CM | POA: Diagnosis not present

## 2021-09-04 DIAGNOSIS — H6123 Impacted cerumen, bilateral: Secondary | ICD-10-CM | POA: Diagnosis not present

## 2021-09-04 DIAGNOSIS — I5032 Chronic diastolic (congestive) heart failure: Secondary | ICD-10-CM | POA: Diagnosis not present

## 2021-09-04 DIAGNOSIS — E559 Vitamin D deficiency, unspecified: Secondary | ICD-10-CM | POA: Diagnosis not present

## 2021-09-04 DIAGNOSIS — G319 Degenerative disease of nervous system, unspecified: Secondary | ICD-10-CM | POA: Diagnosis not present

## 2021-09-04 DIAGNOSIS — G629 Polyneuropathy, unspecified: Secondary | ICD-10-CM | POA: Diagnosis not present

## 2021-09-04 DIAGNOSIS — E039 Hypothyroidism, unspecified: Secondary | ICD-10-CM | POA: Diagnosis not present

## 2021-09-04 DIAGNOSIS — Z9181 History of falling: Secondary | ICD-10-CM | POA: Diagnosis not present

## 2021-09-04 DIAGNOSIS — R42 Dizziness and giddiness: Secondary | ICD-10-CM | POA: Diagnosis not present

## 2021-09-04 DIAGNOSIS — H3530 Unspecified macular degeneration: Secondary | ICD-10-CM | POA: Diagnosis not present

## 2021-09-04 DIAGNOSIS — R32 Unspecified urinary incontinence: Secondary | ICD-10-CM | POA: Diagnosis not present

## 2021-09-04 DIAGNOSIS — M109 Gout, unspecified: Secondary | ICD-10-CM | POA: Diagnosis not present

## 2021-09-04 DIAGNOSIS — I130 Hypertensive heart and chronic kidney disease with heart failure and stage 1 through stage 4 chronic kidney disease, or unspecified chronic kidney disease: Secondary | ICD-10-CM | POA: Diagnosis not present

## 2021-09-04 DIAGNOSIS — I471 Supraventricular tachycardia: Secondary | ICD-10-CM | POA: Diagnosis not present

## 2021-09-04 DIAGNOSIS — I480 Paroxysmal atrial fibrillation: Secondary | ICD-10-CM | POA: Diagnosis not present

## 2021-09-04 DIAGNOSIS — M170 Bilateral primary osteoarthritis of knee: Secondary | ICD-10-CM | POA: Diagnosis not present

## 2021-09-04 DIAGNOSIS — Z8673 Personal history of transient ischemic attack (TIA), and cerebral infarction without residual deficits: Secondary | ICD-10-CM | POA: Diagnosis not present

## 2021-09-04 DIAGNOSIS — E7800 Pure hypercholesterolemia, unspecified: Secondary | ICD-10-CM | POA: Diagnosis not present

## 2021-09-04 DIAGNOSIS — M810 Age-related osteoporosis without current pathological fracture: Secondary | ICD-10-CM | POA: Diagnosis not present

## 2021-09-04 DIAGNOSIS — K219 Gastro-esophageal reflux disease without esophagitis: Secondary | ICD-10-CM | POA: Diagnosis not present

## 2021-09-04 DIAGNOSIS — I7121 Aneurysm of the ascending aorta, without rupture: Secondary | ICD-10-CM | POA: Diagnosis not present

## 2021-09-04 DIAGNOSIS — N1832 Chronic kidney disease, stage 3b: Secondary | ICD-10-CM | POA: Diagnosis not present

## 2021-09-04 DIAGNOSIS — G4733 Obstructive sleep apnea (adult) (pediatric): Secondary | ICD-10-CM | POA: Diagnosis not present

## 2021-09-07 ENCOUNTER — Telehealth: Payer: Self-pay | Admitting: Internal Medicine

## 2021-09-07 NOTE — Telephone Encounter (Signed)
Verbal discussion with provider in regards to patients upcoming appointments. Spoke with daughter and reviewed that we can cancel tomorrows visit and she can keep upcoming appointment with Dr. Caryl Comes. Daughter reports patient has been doing well and no further needs at this time. She verbalized understanding and advised to call back if she should need anything before that upcoming appointment.

## 2021-09-07 NOTE — Telephone Encounter (Signed)
Pt's daughter would like to know if pt needs to keep appt for tomorrow or wait until 9/29 to see Dr. Caryl Comes. Please advise

## 2021-09-07 NOTE — Progress Notes (Deleted)
Cardiology Office Note    Date:  09/07/2021   ID:  Jessica Kaufman, DOB [birth year], MRN [id number]  PCP:  Leonides Sake, MD  Cardiologist:  Nelva Bush, MD  Electrophysiologist:  None   Chief Complaint: Follow-up  History of Present Illness:   Jessica Kaufman is a 88 y.o. female with history of ***  ***   Labs independently reviewed: ***  Past Medical History:  Diagnosis Date   Aortic valve disease    Mild-moderate aortic stenosis   Asthma    Cancer ([place])    basal cell    Gout    Hyperlipidemia    Hypertension    Hypertensive heart disease without heart failure    Hypothyroidism    PAF (paroxysmal atrial fibrillation) ([place])    PVC (premature ventricular contraction)    Rheumatoid arthritis ([place])    Ventricular premature depolarization     Past Surgical History:  Procedure Laterality Date   BLADDER SURGERY  10/19/2010   BONY PELVIS SURGERY  1990   BREAST BIOPSY Left 1986   CARDIAC SURGERY  2009   PVC   CATARACT EXTRACTION Left 11/15/00   CATARACT EXTRACTION Right    ELBOW SURGERY  06/1990   HIP SURGERY Left 05/28/09   HIP SURGERY Right 06/30/03   HYSTEROSCOPY  06/17/1986   vaginal    Current Medications: No outpatient medications have been marked as taking for the 09/08/21 encounter (Appointment) with Rise Mu, PA-C.    Allergies:   Metronidazole, Sulindac, and Naproxen   Social History   Socioeconomic History   Marital status: Widowed    Spouse name: Not on file   Number of children: 7   Years of education: 12th   Highest education level: Not on file  Occupational History   Occupation: Retired  Tobacco Use   Smoking status: Never   Smokeless tobacco: Never  Vaping Use   Vaping Use: Never used  Substance and Sexual Activity   Alcohol use: No   Drug use: No   Sexual activity: Not on file  Other Topics Concern   Not on file  Social History Narrative   Patient lives at home alone.Caffeine Use: 1 cup daily 7 children (1  deceased)   Social Determinants of Health   Financial Resource Strain: Not on file  Food Insecurity: Not on file  Transportation Needs: Not on file  Physical Activity: Not on file  Stress: Not on file  Social Connections: Not on file     Family History:  The patient's family history includes Breast cancer in her daughter; Heart attack in her father and maternal grandmother; Heart failure in her sister; Melanoma in her mother and sister; Stroke in her mother; Uterine cancer in her sister.  ROS:   ROS   EKGs/Labs/Other Studies Reviewed:    Studies reviewed were summarized above. The additional studies were reviewed today: ***  EKG:  EKG is ordered today.  The EKG ordered today demonstrates ***  Recent Labs: 07/04/2021: ALT 18; BUN 19; Creatinine, Ser 0.76; Hemoglobin 12.8; Platelets 325; Potassium 3.6; Sodium 137  Recent Lipid Panel    Component Value Date/Time   CHOL 199 05/08/2020 0522   TRIG 88 05/08/2020 0522   HDL 59 05/08/2020 0522   CHOLHDL 3.4 05/08/2020 0522   VLDL 18 05/08/2020 0522   LDLCALC 122 (H) 05/08/2020 0522    PHYSICAL EXAM:    VS:  There were no vitals taken for this visit.  BMI: There is no  height or weight on file to calculate BMI.  Physical Exam  Wt Readings from Last 3 Encounters:  08/31/21 218 lb 9.6 oz (99.2 kg)  07/15/21 216 lb (98 kg)  07/04/21 213 lb (96.6 kg)     ASSESSMENT & PLAN:   ***   {Are you ordering a CV Procedure (e.g. stress test, cath, DCCV, TEE, etc)?   Press F2        :[id number]}     Disposition: F/u with Dr. Saunders Revel or an APP in ***.   Medication Adjustments/Labs and Tests Ordered: Current medicines are reviewed at length with the patient today.  Concerns regarding medicines are outlined above. Medication changes, Labs and Tests ordered today are summarized above and listed in the Patient Instructions accessible in Encounters.   Signed, Christell Faith, PA-C 09/07/2021 12:03 PM     [address] [place], [place] [address] [phone]

## 2021-09-08 ENCOUNTER — Ambulatory Visit: Payer: PPO | Admitting: Physician Assistant

## 2021-09-08 DIAGNOSIS — E7800 Pure hypercholesterolemia, unspecified: Secondary | ICD-10-CM | POA: Diagnosis not present

## 2021-09-08 DIAGNOSIS — G4733 Obstructive sleep apnea (adult) (pediatric): Secondary | ICD-10-CM | POA: Diagnosis not present

## 2021-09-08 DIAGNOSIS — E559 Vitamin D deficiency, unspecified: Secondary | ICD-10-CM | POA: Diagnosis not present

## 2021-09-08 DIAGNOSIS — E039 Hypothyroidism, unspecified: Secondary | ICD-10-CM | POA: Diagnosis not present

## 2021-09-08 DIAGNOSIS — I471 Supraventricular tachycardia: Secondary | ICD-10-CM | POA: Diagnosis not present

## 2021-09-08 DIAGNOSIS — N1832 Chronic kidney disease, stage 3b: Secondary | ICD-10-CM | POA: Diagnosis not present

## 2021-09-08 DIAGNOSIS — M109 Gout, unspecified: Secondary | ICD-10-CM | POA: Diagnosis not present

## 2021-09-08 DIAGNOSIS — Z9181 History of falling: Secondary | ICD-10-CM | POA: Diagnosis not present

## 2021-09-08 DIAGNOSIS — I5032 Chronic diastolic (congestive) heart failure: Secondary | ICD-10-CM | POA: Diagnosis not present

## 2021-09-08 DIAGNOSIS — M810 Age-related osteoporosis without current pathological fracture: Secondary | ICD-10-CM | POA: Diagnosis not present

## 2021-09-08 DIAGNOSIS — K219 Gastro-esophageal reflux disease without esophagitis: Secondary | ICD-10-CM | POA: Diagnosis not present

## 2021-09-08 DIAGNOSIS — G629 Polyneuropathy, unspecified: Secondary | ICD-10-CM | POA: Diagnosis not present

## 2021-09-08 DIAGNOSIS — M170 Bilateral primary osteoarthritis of knee: Secondary | ICD-10-CM | POA: Diagnosis not present

## 2021-09-08 DIAGNOSIS — R32 Unspecified urinary incontinence: Secondary | ICD-10-CM | POA: Diagnosis not present

## 2021-09-08 DIAGNOSIS — G319 Degenerative disease of nervous system, unspecified: Secondary | ICD-10-CM | POA: Diagnosis not present

## 2021-09-08 DIAGNOSIS — M069 Rheumatoid arthritis, unspecified: Secondary | ICD-10-CM | POA: Diagnosis not present

## 2021-09-08 DIAGNOSIS — H6123 Impacted cerumen, bilateral: Secondary | ICD-10-CM | POA: Diagnosis not present

## 2021-09-08 DIAGNOSIS — I130 Hypertensive heart and chronic kidney disease with heart failure and stage 1 through stage 4 chronic kidney disease, or unspecified chronic kidney disease: Secondary | ICD-10-CM | POA: Diagnosis not present

## 2021-09-08 DIAGNOSIS — I350 Nonrheumatic aortic (valve) stenosis: Secondary | ICD-10-CM | POA: Diagnosis not present

## 2021-09-08 DIAGNOSIS — I480 Paroxysmal atrial fibrillation: Secondary | ICD-10-CM | POA: Diagnosis not present

## 2021-09-08 DIAGNOSIS — I7121 Aneurysm of the ascending aorta, without rupture: Secondary | ICD-10-CM | POA: Diagnosis not present

## 2021-09-08 DIAGNOSIS — H3530 Unspecified macular degeneration: Secondary | ICD-10-CM | POA: Diagnosis not present

## 2021-09-08 DIAGNOSIS — Z8673 Personal history of transient ischemic attack (TIA), and cerebral infarction without residual deficits: Secondary | ICD-10-CM | POA: Diagnosis not present

## 2021-09-08 DIAGNOSIS — R42 Dizziness and giddiness: Secondary | ICD-10-CM | POA: Diagnosis not present

## 2021-09-14 DIAGNOSIS — D225 Melanocytic nevi of trunk: Secondary | ICD-10-CM | POA: Diagnosis not present

## 2021-09-14 DIAGNOSIS — D485 Neoplasm of uncertain behavior of skin: Secondary | ICD-10-CM | POA: Diagnosis not present

## 2021-09-14 DIAGNOSIS — L821 Other seborrheic keratosis: Secondary | ICD-10-CM | POA: Diagnosis not present

## 2021-09-14 DIAGNOSIS — D2239 Melanocytic nevi of other parts of face: Secondary | ICD-10-CM | POA: Diagnosis not present

## 2021-09-15 DIAGNOSIS — H35033 Hypertensive retinopathy, bilateral: Secondary | ICD-10-CM | POA: Diagnosis not present

## 2021-09-15 DIAGNOSIS — K219 Gastro-esophageal reflux disease without esophagitis: Secondary | ICD-10-CM | POA: Diagnosis not present

## 2021-09-15 DIAGNOSIS — H353211 Exudative age-related macular degeneration, right eye, with active choroidal neovascularization: Secondary | ICD-10-CM | POA: Diagnosis not present

## 2021-09-15 DIAGNOSIS — M069 Rheumatoid arthritis, unspecified: Secondary | ICD-10-CM | POA: Diagnosis not present

## 2021-09-15 DIAGNOSIS — N1832 Chronic kidney disease, stage 3b: Secondary | ICD-10-CM | POA: Diagnosis not present

## 2021-09-15 DIAGNOSIS — G319 Degenerative disease of nervous system, unspecified: Secondary | ICD-10-CM | POA: Diagnosis not present

## 2021-09-15 DIAGNOSIS — G510 Bell's palsy: Secondary | ICD-10-CM | POA: Diagnosis not present

## 2021-09-15 DIAGNOSIS — E7800 Pure hypercholesterolemia, unspecified: Secondary | ICD-10-CM | POA: Diagnosis not present

## 2021-09-15 DIAGNOSIS — I471 Supraventricular tachycardia: Secondary | ICD-10-CM | POA: Diagnosis not present

## 2021-09-15 DIAGNOSIS — E559 Vitamin D deficiency, unspecified: Secondary | ICD-10-CM | POA: Diagnosis not present

## 2021-09-15 DIAGNOSIS — M109 Gout, unspecified: Secondary | ICD-10-CM | POA: Diagnosis not present

## 2021-09-15 DIAGNOSIS — G4733 Obstructive sleep apnea (adult) (pediatric): Secondary | ICD-10-CM | POA: Diagnosis not present

## 2021-09-15 DIAGNOSIS — I5032 Chronic diastolic (congestive) heart failure: Secondary | ICD-10-CM | POA: Diagnosis not present

## 2021-09-15 DIAGNOSIS — I130 Hypertensive heart and chronic kidney disease with heart failure and stage 1 through stage 4 chronic kidney disease, or unspecified chronic kidney disease: Secondary | ICD-10-CM | POA: Diagnosis not present

## 2021-09-15 DIAGNOSIS — H353122 Nonexudative age-related macular degeneration, left eye, intermediate dry stage: Secondary | ICD-10-CM | POA: Diagnosis not present

## 2021-09-15 DIAGNOSIS — I7121 Aneurysm of the ascending aorta, without rupture: Secondary | ICD-10-CM | POA: Diagnosis not present

## 2021-09-15 DIAGNOSIS — H3530 Unspecified macular degeneration: Secondary | ICD-10-CM | POA: Diagnosis not present

## 2021-09-15 DIAGNOSIS — I480 Paroxysmal atrial fibrillation: Secondary | ICD-10-CM | POA: Diagnosis not present

## 2021-09-15 DIAGNOSIS — M810 Age-related osteoporosis without current pathological fracture: Secondary | ICD-10-CM | POA: Diagnosis not present

## 2021-09-15 DIAGNOSIS — R32 Unspecified urinary incontinence: Secondary | ICD-10-CM | POA: Diagnosis not present

## 2021-09-15 DIAGNOSIS — E039 Hypothyroidism, unspecified: Secondary | ICD-10-CM | POA: Diagnosis not present

## 2021-09-15 DIAGNOSIS — M170 Bilateral primary osteoarthritis of knee: Secondary | ICD-10-CM | POA: Diagnosis not present

## 2021-09-15 DIAGNOSIS — I350 Nonrheumatic aortic (valve) stenosis: Secondary | ICD-10-CM | POA: Diagnosis not present

## 2021-09-15 DIAGNOSIS — H6123 Impacted cerumen, bilateral: Secondary | ICD-10-CM | POA: Diagnosis not present

## 2021-09-15 DIAGNOSIS — R42 Dizziness and giddiness: Secondary | ICD-10-CM | POA: Diagnosis not present

## 2021-09-15 DIAGNOSIS — G629 Polyneuropathy, unspecified: Secondary | ICD-10-CM | POA: Diagnosis not present

## 2021-09-15 DIAGNOSIS — Z9181 History of falling: Secondary | ICD-10-CM | POA: Diagnosis not present

## 2021-09-15 DIAGNOSIS — Z8673 Personal history of transient ischemic attack (TIA), and cerebral infarction without residual deficits: Secondary | ICD-10-CM | POA: Diagnosis not present

## 2021-09-20 DIAGNOSIS — Z8673 Personal history of transient ischemic attack (TIA), and cerebral infarction without residual deficits: Secondary | ICD-10-CM | POA: Diagnosis not present

## 2021-09-20 DIAGNOSIS — I130 Hypertensive heart and chronic kidney disease with heart failure and stage 1 through stage 4 chronic kidney disease, or unspecified chronic kidney disease: Secondary | ICD-10-CM | POA: Diagnosis not present

## 2021-09-20 DIAGNOSIS — G4733 Obstructive sleep apnea (adult) (pediatric): Secondary | ICD-10-CM | POA: Diagnosis not present

## 2021-09-20 DIAGNOSIS — G629 Polyneuropathy, unspecified: Secondary | ICD-10-CM | POA: Diagnosis not present

## 2021-09-20 DIAGNOSIS — K219 Gastro-esophageal reflux disease without esophagitis: Secondary | ICD-10-CM | POA: Diagnosis not present

## 2021-09-20 DIAGNOSIS — G319 Degenerative disease of nervous system, unspecified: Secondary | ICD-10-CM | POA: Diagnosis not present

## 2021-09-20 DIAGNOSIS — M810 Age-related osteoporosis without current pathological fracture: Secondary | ICD-10-CM | POA: Diagnosis not present

## 2021-09-20 DIAGNOSIS — R42 Dizziness and giddiness: Secondary | ICD-10-CM | POA: Diagnosis not present

## 2021-09-20 DIAGNOSIS — E559 Vitamin D deficiency, unspecified: Secondary | ICD-10-CM | POA: Diagnosis not present

## 2021-09-20 DIAGNOSIS — R32 Unspecified urinary incontinence: Secondary | ICD-10-CM | POA: Diagnosis not present

## 2021-09-20 DIAGNOSIS — I5032 Chronic diastolic (congestive) heart failure: Secondary | ICD-10-CM | POA: Diagnosis not present

## 2021-09-20 DIAGNOSIS — H6123 Impacted cerumen, bilateral: Secondary | ICD-10-CM | POA: Diagnosis not present

## 2021-09-20 DIAGNOSIS — I7121 Aneurysm of the ascending aorta, without rupture: Secondary | ICD-10-CM | POA: Diagnosis not present

## 2021-09-20 DIAGNOSIS — I480 Paroxysmal atrial fibrillation: Secondary | ICD-10-CM | POA: Diagnosis not present

## 2021-09-20 DIAGNOSIS — I350 Nonrheumatic aortic (valve) stenosis: Secondary | ICD-10-CM | POA: Diagnosis not present

## 2021-09-20 DIAGNOSIS — N1832 Chronic kidney disease, stage 3b: Secondary | ICD-10-CM | POA: Diagnosis not present

## 2021-09-20 DIAGNOSIS — M109 Gout, unspecified: Secondary | ICD-10-CM | POA: Diagnosis not present

## 2021-09-20 DIAGNOSIS — H3530 Unspecified macular degeneration: Secondary | ICD-10-CM | POA: Diagnosis not present

## 2021-09-20 DIAGNOSIS — E7800 Pure hypercholesterolemia, unspecified: Secondary | ICD-10-CM | POA: Diagnosis not present

## 2021-09-20 DIAGNOSIS — E039 Hypothyroidism, unspecified: Secondary | ICD-10-CM | POA: Diagnosis not present

## 2021-09-20 DIAGNOSIS — M069 Rheumatoid arthritis, unspecified: Secondary | ICD-10-CM | POA: Diagnosis not present

## 2021-09-20 DIAGNOSIS — M170 Bilateral primary osteoarthritis of knee: Secondary | ICD-10-CM | POA: Diagnosis not present

## 2021-09-20 DIAGNOSIS — Z9181 History of falling: Secondary | ICD-10-CM | POA: Diagnosis not present

## 2021-09-20 DIAGNOSIS — I471 Supraventricular tachycardia: Secondary | ICD-10-CM | POA: Diagnosis not present

## 2021-09-23 DIAGNOSIS — I10 Essential (primary) hypertension: Secondary | ICD-10-CM | POA: Diagnosis not present

## 2021-09-23 DIAGNOSIS — J449 Chronic obstructive pulmonary disease, unspecified: Secondary | ICD-10-CM | POA: Diagnosis not present

## 2021-09-23 DIAGNOSIS — Z515 Encounter for palliative care: Secondary | ICD-10-CM | POA: Diagnosis not present

## 2021-09-23 DIAGNOSIS — G4733 Obstructive sleep apnea (adult) (pediatric): Secondary | ICD-10-CM | POA: Diagnosis not present

## 2021-09-27 DIAGNOSIS — R600 Localized edema: Secondary | ICD-10-CM | POA: Diagnosis not present

## 2021-09-27 DIAGNOSIS — I5032 Chronic diastolic (congestive) heart failure: Secondary | ICD-10-CM | POA: Diagnosis not present

## 2021-09-29 ENCOUNTER — Other Ambulatory Visit: Payer: Self-pay

## 2021-09-29 DIAGNOSIS — I5032 Chronic diastolic (congestive) heart failure: Secondary | ICD-10-CM | POA: Diagnosis not present

## 2021-09-29 DIAGNOSIS — Z7951 Long term (current) use of inhaled steroids: Secondary | ICD-10-CM | POA: Diagnosis not present

## 2021-09-29 DIAGNOSIS — N1832 Chronic kidney disease, stage 3b: Secondary | ICD-10-CM | POA: Diagnosis not present

## 2021-09-29 DIAGNOSIS — E039 Hypothyroidism, unspecified: Secondary | ICD-10-CM | POA: Diagnosis not present

## 2021-09-29 DIAGNOSIS — M109 Gout, unspecified: Secondary | ICD-10-CM | POA: Diagnosis not present

## 2021-09-29 DIAGNOSIS — G629 Polyneuropathy, unspecified: Secondary | ICD-10-CM | POA: Diagnosis not present

## 2021-09-29 DIAGNOSIS — G4733 Obstructive sleep apnea (adult) (pediatric): Secondary | ICD-10-CM | POA: Diagnosis not present

## 2021-09-29 DIAGNOSIS — Z7901 Long term (current) use of anticoagulants: Secondary | ICD-10-CM | POA: Diagnosis not present

## 2021-09-29 DIAGNOSIS — K219 Gastro-esophageal reflux disease without esophagitis: Secondary | ICD-10-CM | POA: Diagnosis not present

## 2021-09-29 DIAGNOSIS — H6123 Impacted cerumen, bilateral: Secondary | ICD-10-CM | POA: Diagnosis not present

## 2021-09-29 DIAGNOSIS — I7121 Aneurysm of the ascending aorta, without rupture: Secondary | ICD-10-CM | POA: Diagnosis not present

## 2021-09-29 DIAGNOSIS — I471 Supraventricular tachycardia: Secondary | ICD-10-CM | POA: Diagnosis not present

## 2021-09-29 DIAGNOSIS — I350 Nonrheumatic aortic (valve) stenosis: Secondary | ICD-10-CM | POA: Diagnosis not present

## 2021-09-29 DIAGNOSIS — R32 Unspecified urinary incontinence: Secondary | ICD-10-CM | POA: Diagnosis not present

## 2021-09-29 DIAGNOSIS — E559 Vitamin D deficiency, unspecified: Secondary | ICD-10-CM | POA: Diagnosis not present

## 2021-09-29 DIAGNOSIS — M170 Bilateral primary osteoarthritis of knee: Secondary | ICD-10-CM | POA: Diagnosis not present

## 2021-09-29 DIAGNOSIS — I480 Paroxysmal atrial fibrillation: Secondary | ICD-10-CM | POA: Diagnosis not present

## 2021-09-29 DIAGNOSIS — R42 Dizziness and giddiness: Secondary | ICD-10-CM | POA: Diagnosis not present

## 2021-09-29 DIAGNOSIS — M810 Age-related osteoporosis without current pathological fracture: Secondary | ICD-10-CM | POA: Diagnosis not present

## 2021-09-29 DIAGNOSIS — G319 Degenerative disease of nervous system, unspecified: Secondary | ICD-10-CM | POA: Diagnosis not present

## 2021-09-29 DIAGNOSIS — I130 Hypertensive heart and chronic kidney disease with heart failure and stage 1 through stage 4 chronic kidney disease, or unspecified chronic kidney disease: Secondary | ICD-10-CM | POA: Diagnosis not present

## 2021-09-29 DIAGNOSIS — H3530 Unspecified macular degeneration: Secondary | ICD-10-CM | POA: Diagnosis not present

## 2021-09-29 DIAGNOSIS — M069 Rheumatoid arthritis, unspecified: Secondary | ICD-10-CM | POA: Diagnosis not present

## 2021-09-29 DIAGNOSIS — E7800 Pure hypercholesterolemia, unspecified: Secondary | ICD-10-CM | POA: Diagnosis not present

## 2021-09-29 MED ORDER — CARVEDILOL 3.125 MG PO TABS: ORAL_TABLET | ORAL | 0 refills | Status: DC

## 2021-09-30 DIAGNOSIS — C44529 Squamous cell carcinoma of skin of other part of trunk: Secondary | ICD-10-CM | POA: Diagnosis not present

## 2021-10-01 ENCOUNTER — Ambulatory Visit: Payer: PPO | Attending: Internal Medicine | Admitting: Internal Medicine

## 2021-10-01 ENCOUNTER — Encounter: Payer: Self-pay | Admitting: Internal Medicine

## 2021-10-01 VITALS — Ht 63.0 in | Wt 218.0 lb

## 2021-10-01 DIAGNOSIS — I480 Paroxysmal atrial fibrillation: Secondary | ICD-10-CM

## 2021-10-01 MED ORDER — CARVEDILOL 6.25 MG PO TABS: 6.2500 mg | ORAL_TABLET | Freq: Two times a day (BID) | ORAL | 3 refills | Status: DC

## 2021-10-01 NOTE — Progress Notes (Signed)
ELECTROPHYSIOLOGY CONSULT NOTE  Patient ID: Jessica Kaufman, MRN: [id number], DOB/AGE: 08-05-1925 88 y.o. Admit date: (Not on file) Date of Consult: 10/01/2021  Primary Physician: Leonides Sake, MD Primary Cardiologist: CE     Jessica Kaufman is a 88 y.o. female who is being seen today for the evaluation of syncope at the request of CE/RD.    HPI Jessica Kaufman is a 88 y.o. female referred for syncope.  7/23 seen in the ER following loss of consciousness; this occurred while eating breakfast.  Associate with palpitations.  Blood pressure 60/36 and EMS noted atrial fibrillation with initially controlled ventricular rate, and this shortly after having presented with "Bell's palsy.  "Approximately 15 minutes later, still hypotensive her heart rate was then in the 140s.  Event Recorder personnally reviewed paroxysms of atrial fibrillation with a 24% burden.  Short runs of nonsustained tachycardia.  A 3.3-second pause was noted w afib termination; atrial fibrillation rates were noted in the high 190s with a mean heart rate in the 120s.  Mean sinus rates are in the 60s during the day in the 50s at night.  Has a history of hypertension" labile hypotension "where she feels just like her blood pressure is low.  She manages her hypertension with as needed hydralazine but blood pressures typically are in the 160-170 range anyway and she ends up on hydralazine most days of the week  History of a mechanical fall    DATE TEST EF   3/21 Echo   55-65 %   1/23 Echo  60-65% AoV mean grad 15 AVA 1.6cm2   Date Cr K Hgb  7/23 0.76 3.6 12.8            Past Medical History:  Diagnosis Date   Aortic valve disease    Mild-moderate aortic stenosis   Asthma    Cancer (HCC)    basal cell    Gout    Hyperlipidemia    Hypertension    Hypertensive heart disease without heart failure    Hypothyroidism    PAF (paroxysmal atrial fibrillation) (HCC)    PVC (premature ventricular  contraction)    Rheumatoid arthritis ([place])    Ventricular premature depolarization       Surgical History:  Past Surgical History:  Procedure Laterality Date   BLADDER SURGERY  10/19/2010   BONY PELVIS SURGERY  1990   BREAST BIOPSY Left 1986   CARDIAC SURGERY  2009   PVC   CATARACT EXTRACTION Left 11/15/00   CATARACT EXTRACTION Right    ELBOW SURGERY  06/1990   HIP SURGERY Left 05/28/09   HIP SURGERY Right 06/30/03   HYSTEROSCOPY  06/17/1986   vaginal     Home Meds: Current Meds  Medication Sig   acetaminophen (TYLENOL) 650 MG CR tablet Take 650 mg by mouth every 8 (eight) hours as needed for pain.   Albuterol Sulfate (PROAIR RESPICLICK) 272 (90 Base) MCG/ACT AEPB Inhale 2 puffs into the lungs every 6 (six) hours as needed.   amoxicillin (AMOXIL) 500 MG tablet Take 4 tablets (2,000 mg total) by mouth 2 (two) times daily. For dental procedures only   apixaban (ELIQUIS) 5 MG TABS tablet Take 5 mg by mouth 2 (two) times daily.    b complex vitamins tablet Take 1 tablet by mouth daily.   Bioflavonoid Products (BIOFLEX) TABS Take 2 tablets by mouth daily.   carvedilol (COREG) 6.25 MG tablet Take 1 tablet (6.25 mg total) by  mouth 2 (two) times daily.   Cholecalciferol (VITAMIN D3) 1000 UNITS CAPS Take 1 capsule by mouth daily.   Coenzyme Q10 (COQ10) 100 MG CAPS Take 1 capsule by mouth daily.   CRANBERRY PO Take 1 tablet by mouth daily.   famotidine (PEPCID) 20 MG tablet Take 20 mg by mouth daily.   fexofenadine (ALLEGRA) 180 MG tablet Take 180 mg by mouth daily.   Fluticasone-Umeclidin-Vilant (TRELEGY ELLIPTA) 100-62.5-25 MCG/ACT AEPB Inhale 100 mcg into the lungs daily.   Fluticasone-Umeclidin-Vilant (TRELEGY ELLIPTA) 100-62.5-25 MCG/ACT AEPB Inhale 1 puff into the lungs daily.   furosemide (LASIX) 40 MG tablet Take 0.5 tablets (20 mg total) by mouth as directed. Take half a tablet (20 mg) once daily (Patient taking differently: Take 20 mg by mouth as directed. 40 mg by mouth in the  morning, and 20 mg at noon)   gabapentin (NEURONTIN) 600 MG tablet Take 600 mg by mouth 2 (two) times daily.   Ginger, Zingiber officinalis, (GINGER ROOT PO) Take by mouth daily.   guaiFENesin (MUCINEX) 600 MG 12 hr tablet Take 600 mg by mouth daily.   hydrALAZINE (APRESOLINE) 25 MG tablet TAKE 1 TABLET BY MOUTH ONCE A DAY AND TWICE A DAY AS NEEDED FOR SYSTOLIC BP OF 836 OR GREATER   levothyroxine (SYNTHROID, LEVOTHROID) 50 MCG tablet Take 1 tablet by mouth daily.   losartan (COZAAR) 50 MG tablet Take 1 tablet (50 mg total) by mouth 2 (two) times daily.   Multiple Vitamins-Minerals (CENTRUM SILVER PO) Take 1 tablet by mouth daily.   Multiple Vitamins-Minerals (PRESERVISION AREDS 2 PO) Take 1 capsule by mouth 2 (two) times daily.    Omega-3 Fatty Acids (FISH OIL) 1000 MG CAPS Take 2 capsules by mouth daily.   PREMARIN vaginal cream Place 1 Applicatorful vaginally in the morning and at bedtime.    psyllium (METAMUCIL) 58.6 % powder Take 1 packet by mouth daily.   TURMERIC PO Take by mouth daily.   [DISCONTINUED] carvedilol (COREG) 3.125 MG tablet Take 1 tablet by mouth twice a day with a meal    Allergies:  Allergies  Allergen Reactions   Metronidazole Other (See Comments)    RASH   Sulindac Other (See Comments)    UNKNOWN   Naproxen Rash    Social History   Socioeconomic History   Marital status: Widowed    Spouse name: Not on file   Number of children: 7   Years of education: 12th   Highest education level: Not on file  Occupational History   Occupation: Retired  Tobacco Use   Smoking status: Never   Smokeless tobacco: Never  Vaping Use   Vaping Use: Never used  Substance and Sexual Activity   Alcohol use: No   Drug use: No   Sexual activity: Not on file  Other Topics Concern   Not on file  Social History Narrative   Patient lives at home alone.Caffeine Use: 1 cup daily 7 children (1 deceased)   Social Determinants of Health   Financial Resource Strain: Not on file   Food Insecurity: Not on file  Transportation Needs: Not on file  Physical Activity: Not on file  Stress: Not on file  Social Connections: Not on file  Intimate Partner Violence: Not on file     Family History  Problem Relation Age of Onset   Stroke Mother    Melanoma Mother    Heart attack Father    Uterine cancer Sister    Melanoma Sister  Heart failure Sister    Heart attack Maternal Grandmother    Breast cancer Daughter      ROS:  Please see the history of present illness.     All other systems reviewed and negative.    Physical Exam:  Height '5\' 3"'$  (1.6 m), weight 218 lb (98.9 kg), SpO2 94 %. General: Well developed, well nourished female in no acute distress. Head: Normocephalic, atraumatic, sclera non-icteric, no xanthomas, nares are without discharge. EENT: normal ; ptosis left eye Lymph Nodes:  none Neck: Negative for carotid bruits. JVD not elevated. Back:without scoliosis kyphosis Lungs: Clear bilaterally to auscultation without wheezes, rales, or rhonchi. Breathing is unlabored. Heart: RRR with S1 S2. No  murmur . No rubs, or gallops appreciated. Abdomen: Soft, non-tender, non-distended with normoactive bowel sounds. No hepatomegaly. No rebound/guarding. No obvious abdominal masses. Msk:  Strength and tone appear normal for age. Extremities: No clubbing or cyanosis. tr acute kidney is only when here there to edema.  Distal pedal pulses are 2+ and equal bilaterally. Skin: Warm and Dry Neuro: Alert and oriented X 3. CN III-XII intact Grossly normal sensory and motor function . Psych:  Responds to questions appropriately with a normal affect.        EKG: Sinus at 61 Intervals 21/10/42 Nonspecific ST-T changes    Assessment and Plan:  Syncope  Falls-mechanical  Atrial fibrillation-recurrent  Hypertension  Posttermination pauses less than 3.5 seconds  Patient has had recurrent spells at least 1 episode of syncope. Interestingly, the initial event  was associated with hypotension and relative bradycardia.  With persistent hypotension but some stabilization having become horizontal, her heart rate picked up exceedingly suggesting that the bradycardia may have been vagally mediated and thereby suggesting that the event was indeed a neurally mediated episode  Posttermination pause I think is unlikely mechanism of her syncope, I would expected to have been very brief and 3-1/2 seconds is normally not long enough.  She has atrial fibrillation which is rapid and I am surprised that she is not more symptomatic than she is which would be sporadical, related to her episodes of rapid atrial fibrillation.  I have suggested to her and her daughter that she get a FitBit to try to see whether she can correlate her variable sense of wellbeing with the presence or absence of atrial fibrillation and/or bradycardia that might guide [place] into a more specific therapy.  Given the fact that she is hypertensive with rapid ventricular rates in atrial fibrillation, we will increase her carvedilol gingerly from 3.125--6.25 twice daily.     Virl Axe

## 2021-10-01 NOTE — Patient Instructions (Addendum)
Medication Instructions:  Your physician has recommended you make the following change in your medication:   Stop Carvedilol 3.'125mg'$   Begin Carvedilol 6.'25mg'$  - 1 tablet by mouth twice daily  *If you need a refill on your cardiac medications before your next appointment, please call your pharmacy*   Lab Work: None ordered.  If you have labs (blood work) drawn today and your tests are completely normal, you will receive your results only by: [place] (if you have MyChart) OR A paper copy in the mail If you have any lab test that is abnormal or we need to change your treatment, we will call you to review the results.   Testing/Procedures: None ordered.    Follow-Up: At [facility], you and your health needs are our priority.  As part of our continuing mission to provide you with exceptional heart care, we have created designated Provider Care Teams.  These Care Teams include your primary Cardiologist (physician) and Advanced Practice Providers (APPs -  Physician Assistants and Nurse Practitioners) who all work together to provide you with the care you need, when you need it.  We recommend signing up for the patient portal called "MyChart".  Sign up information is provided on this After Visit Summary.  MyChart is used to connect with patients for Virtual Visits (Telemedicine).  Patients are able to view lab/test results, encounter notes, upcoming appointments, etc.  Non-urgent messages can be sent to your provider as well.   To learn more about what you can do with MyChart, go to [email or web address].    Your next appointment:   12 weeks with Dr Caryl Comes in [place] - I will have his scheduler contact to schedule this appointment  Important Information About Sugar

## 2021-10-05 ENCOUNTER — Telehealth: Payer: Self-pay | Admitting: Internal Medicine

## 2021-10-05 DIAGNOSIS — H26492 Other secondary cataract, left eye: Secondary | ICD-10-CM | POA: Diagnosis not present

## 2021-10-05 NOTE — Telephone Encounter (Signed)
Spoke with [facility] order pharmacy and spoke with [employer] Confirmed dosage change for patient and they will get prescription sent out to patient. No further questions or needs at this time.

## 2021-10-05 NOTE — Telephone Encounter (Signed)
Pt c/o medication issue:  1. Name of Medication:   carvedilol (COREG) 6.25 MG tablet  2. How are you currently taking this medication (dosage and times per day)?     3. Are you having a reaction (difficulty breathing--STAT)?   N/A  4. What is your medication issue?   Called stated they received a new prescription for this medication for the 6.25 mg dosage and would like to confirm the change from the 3.12 mg dosage.

## 2021-10-07 ENCOUNTER — Telehealth: Payer: Self-pay | Admitting: Pulmonary Disease

## 2021-10-07 MED ORDER — TRELEGY ELLIPTA 100-62.5-25 MCG/ACT IN AEPB: 1.0000 | INHALATION_SPRAY | Freq: Every day | RESPIRATORY_TRACT | 0 refills | Status: DC

## 2021-10-07 NOTE — Telephone Encounter (Signed)
One sample of trelegy has been placed up front for pickup.  Patient is aware and voiced her understanding.  Pt will call insurance to obtain a list of cheaper alternatives. She will call back with list.  Nothing further needed.

## 2021-10-08 NOTE — Telephone Encounter (Signed)
Pt called office stating that she called her insurance company to find some other inhalers that were preferred with her insurance.  Pt stated that she was told by insurance company that Grant Ruts is covered by her insurance.  Routing to prior auth team to see if there are any other inhalers that are covered by pt's insurance prior to sending this to Dr. Darnell Level. Please advise.

## 2021-10-11 ENCOUNTER — Other Ambulatory Visit (HOSPITAL_COMMUNITY): Payer: Self-pay

## 2021-10-12 ENCOUNTER — Other Ambulatory Visit (HOSPITAL_COMMUNITY): Payer: Self-pay

## 2021-10-12 NOTE — Telephone Encounter (Signed)
Prices under insurance per test claims: ICS+LABA+LAMA Trelegy: $173.02 Breztri: $170.24  ICS+LABA Advair/Wixela: $31.77 Breo: $117.40 Brand Symbicort: $116.28  LAMA Incruse: $107.96 Brand Spiriva Handihaler: $144.09 Spiriva Respimat: $144.09  LABA Serevent: $123.82  ICS Asmanex: $131.42 BRAND Flovent HFA: $89.60 Flovent Diskus: $73.75 Arnuity: $74.65

## 2021-10-13 DIAGNOSIS — Z7951 Long term (current) use of inhaled steroids: Secondary | ICD-10-CM | POA: Diagnosis not present

## 2021-10-13 DIAGNOSIS — M069 Rheumatoid arthritis, unspecified: Secondary | ICD-10-CM | POA: Diagnosis not present

## 2021-10-13 DIAGNOSIS — G629 Polyneuropathy, unspecified: Secondary | ICD-10-CM | POA: Diagnosis not present

## 2021-10-13 DIAGNOSIS — R32 Unspecified urinary incontinence: Secondary | ICD-10-CM | POA: Diagnosis not present

## 2021-10-13 DIAGNOSIS — I350 Nonrheumatic aortic (valve) stenosis: Secondary | ICD-10-CM | POA: Diagnosis not present

## 2021-10-13 DIAGNOSIS — G4733 Obstructive sleep apnea (adult) (pediatric): Secondary | ICD-10-CM | POA: Diagnosis not present

## 2021-10-13 DIAGNOSIS — M109 Gout, unspecified: Secondary | ICD-10-CM | POA: Diagnosis not present

## 2021-10-13 DIAGNOSIS — H3530 Unspecified macular degeneration: Secondary | ICD-10-CM | POA: Diagnosis not present

## 2021-10-13 DIAGNOSIS — I7121 Aneurysm of the ascending aorta, without rupture: Secondary | ICD-10-CM | POA: Diagnosis not present

## 2021-10-13 DIAGNOSIS — G319 Degenerative disease of nervous system, unspecified: Secondary | ICD-10-CM | POA: Diagnosis not present

## 2021-10-13 DIAGNOSIS — M810 Age-related osteoporosis without current pathological fracture: Secondary | ICD-10-CM | POA: Diagnosis not present

## 2021-10-13 DIAGNOSIS — E7800 Pure hypercholesterolemia, unspecified: Secondary | ICD-10-CM | POA: Diagnosis not present

## 2021-10-13 DIAGNOSIS — E039 Hypothyroidism, unspecified: Secondary | ICD-10-CM | POA: Diagnosis not present

## 2021-10-13 DIAGNOSIS — R42 Dizziness and giddiness: Secondary | ICD-10-CM | POA: Diagnosis not present

## 2021-10-13 DIAGNOSIS — I480 Paroxysmal atrial fibrillation: Secondary | ICD-10-CM | POA: Diagnosis not present

## 2021-10-13 DIAGNOSIS — I5032 Chronic diastolic (congestive) heart failure: Secondary | ICD-10-CM | POA: Diagnosis not present

## 2021-10-13 DIAGNOSIS — E559 Vitamin D deficiency, unspecified: Secondary | ICD-10-CM | POA: Diagnosis not present

## 2021-10-13 DIAGNOSIS — N1832 Chronic kidney disease, stage 3b: Secondary | ICD-10-CM | POA: Diagnosis not present

## 2021-10-13 DIAGNOSIS — I130 Hypertensive heart and chronic kidney disease with heart failure and stage 1 through stage 4 chronic kidney disease, or unspecified chronic kidney disease: Secondary | ICD-10-CM | POA: Diagnosis not present

## 2021-10-13 DIAGNOSIS — H6123 Impacted cerumen, bilateral: Secondary | ICD-10-CM | POA: Diagnosis not present

## 2021-10-13 DIAGNOSIS — M170 Bilateral primary osteoarthritis of knee: Secondary | ICD-10-CM | POA: Diagnosis not present

## 2021-10-13 DIAGNOSIS — I471 Supraventricular tachycardia, unspecified: Secondary | ICD-10-CM | POA: Diagnosis not present

## 2021-10-13 DIAGNOSIS — K219 Gastro-esophageal reflux disease without esophagitis: Secondary | ICD-10-CM | POA: Diagnosis not present

## 2021-10-13 DIAGNOSIS — Z7901 Long term (current) use of anticoagulants: Secondary | ICD-10-CM | POA: Diagnosis not present

## 2021-10-13 NOTE — Telephone Encounter (Signed)
Routing to  triage and Dr. Darnell Level for review.

## 2021-10-13 NOTE — Telephone Encounter (Signed)
Lets try Advair discus 250/50, 1 inhalation twice a day.  We will assess need for LAMA on follow-up.

## 2021-10-14 ENCOUNTER — Telehealth: Payer: Self-pay | Admitting: Internal Medicine

## 2021-10-14 ENCOUNTER — Other Ambulatory Visit: Payer: Self-pay | Admitting: *Deleted

## 2021-10-14 MED ORDER — FUROSEMIDE 40 MG PO TABS: 20.0000 mg | ORAL_TABLET | ORAL | 0 refills | Status: DC

## 2021-10-14 MED ORDER — FLUTICASONE-SALMETEROL 250-50 MCG/ACT IN AEPB: 1.0000 | INHALATION_SPRAY | Freq: Two times a day (BID) | RESPIRATORY_TRACT | 2 refills | Status: DC

## 2021-10-14 MED ORDER — CARVEDILOL 6.25 MG PO TABS: 6.2500 mg | ORAL_TABLET | Freq: Two times a day (BID) | ORAL | 0 refills | Status: DC

## 2021-10-14 NOTE — Telephone Encounter (Signed)
Spoke with pharmacy and confirmed prescription information. No further needs at this time.

## 2021-10-14 NOTE — Telephone Encounter (Signed)
Pt's medication was sent to pt's pharmacy as requested. Confirmation received.  °

## 2021-10-14 NOTE — Telephone Encounter (Signed)
Pt c/o medication issue:  1. Name of Medication: furosemide (LASIX) 40 MG tablet  2. How are you currently taking this medication (dosage and times per day)?   3. Are you having a reaction (difficulty breathing--STAT)?   4. What is your medication issue? [facility] is requesting call back to get clarification on instructions for this medication. Requesting call back.   Ref # [id number]

## 2021-10-14 NOTE — Telephone Encounter (Signed)
*  STAT* If patient is at the pharmacy, call can be transferred to refill team.   1. Which medications need to be refilled? (please list name of each medication and dose if known)  carvedilol (COREG) 6.25 MG tablet  2. Which pharmacy/location (including street and city if local pharmacy) is medication to be sent to[id number] [place]. Do they need a 30 day or 90 day supply?   7-10 day emergency supply  Patient's daughter states there has been a delay with the pharmacy and patient will be out of medication before her mail order arrives.

## 2021-10-14 NOTE — Telephone Encounter (Signed)
Patient is aware of below message/recommendations and voiced her understanding.  Advair sent to preferred pharmacy. Nothing further needed.

## 2021-10-14 NOTE — Addendum Note (Signed)
Addended by: Claudette Head A on: 10/14/2021 08:19 AM   Modules accepted: Orders

## 2021-10-15 DIAGNOSIS — G4733 Obstructive sleep apnea (adult) (pediatric): Secondary | ICD-10-CM | POA: Diagnosis not present

## 2021-10-19 DIAGNOSIS — E039 Hypothyroidism, unspecified: Secondary | ICD-10-CM | POA: Diagnosis not present

## 2021-10-19 DIAGNOSIS — E559 Vitamin D deficiency, unspecified: Secondary | ICD-10-CM | POA: Diagnosis not present

## 2021-10-19 DIAGNOSIS — M170 Bilateral primary osteoarthritis of knee: Secondary | ICD-10-CM | POA: Diagnosis not present

## 2021-10-19 DIAGNOSIS — N1832 Chronic kidney disease, stage 3b: Secondary | ICD-10-CM | POA: Diagnosis not present

## 2021-10-19 DIAGNOSIS — H6123 Impacted cerumen, bilateral: Secondary | ICD-10-CM | POA: Diagnosis not present

## 2021-10-19 DIAGNOSIS — G4733 Obstructive sleep apnea (adult) (pediatric): Secondary | ICD-10-CM | POA: Diagnosis not present

## 2021-10-19 DIAGNOSIS — I7121 Aneurysm of the ascending aorta, without rupture: Secondary | ICD-10-CM | POA: Diagnosis not present

## 2021-10-19 DIAGNOSIS — R42 Dizziness and giddiness: Secondary | ICD-10-CM | POA: Diagnosis not present

## 2021-10-19 DIAGNOSIS — I471 Supraventricular tachycardia, unspecified: Secondary | ICD-10-CM | POA: Diagnosis not present

## 2021-10-19 DIAGNOSIS — G319 Degenerative disease of nervous system, unspecified: Secondary | ICD-10-CM | POA: Diagnosis not present

## 2021-10-19 DIAGNOSIS — K219 Gastro-esophageal reflux disease without esophagitis: Secondary | ICD-10-CM | POA: Diagnosis not present

## 2021-10-19 DIAGNOSIS — E7800 Pure hypercholesterolemia, unspecified: Secondary | ICD-10-CM | POA: Diagnosis not present

## 2021-10-19 DIAGNOSIS — I130 Hypertensive heart and chronic kidney disease with heart failure and stage 1 through stage 4 chronic kidney disease, or unspecified chronic kidney disease: Secondary | ICD-10-CM | POA: Diagnosis not present

## 2021-10-19 DIAGNOSIS — Z7901 Long term (current) use of anticoagulants: Secondary | ICD-10-CM | POA: Diagnosis not present

## 2021-10-19 DIAGNOSIS — I480 Paroxysmal atrial fibrillation: Secondary | ICD-10-CM | POA: Diagnosis not present

## 2021-10-19 DIAGNOSIS — M109 Gout, unspecified: Secondary | ICD-10-CM | POA: Diagnosis not present

## 2021-10-19 DIAGNOSIS — M810 Age-related osteoporosis without current pathological fracture: Secondary | ICD-10-CM | POA: Diagnosis not present

## 2021-10-19 DIAGNOSIS — H3530 Unspecified macular degeneration: Secondary | ICD-10-CM | POA: Diagnosis not present

## 2021-10-19 DIAGNOSIS — R32 Unspecified urinary incontinence: Secondary | ICD-10-CM | POA: Diagnosis not present

## 2021-10-19 DIAGNOSIS — I5032 Chronic diastolic (congestive) heart failure: Secondary | ICD-10-CM | POA: Diagnosis not present

## 2021-10-19 DIAGNOSIS — Z7951 Long term (current) use of inhaled steroids: Secondary | ICD-10-CM | POA: Diagnosis not present

## 2021-10-19 DIAGNOSIS — G629 Polyneuropathy, unspecified: Secondary | ICD-10-CM | POA: Diagnosis not present

## 2021-10-19 DIAGNOSIS — I350 Nonrheumatic aortic (valve) stenosis: Secondary | ICD-10-CM | POA: Diagnosis not present

## 2021-10-19 DIAGNOSIS — M069 Rheumatoid arthritis, unspecified: Secondary | ICD-10-CM | POA: Diagnosis not present

## 2021-10-26 ENCOUNTER — Ambulatory Visit (INDEPENDENT_AMBULATORY_CARE_PROVIDER_SITE_OTHER): Payer: PPO | Admitting: Pulmonary Disease

## 2021-10-26 ENCOUNTER — Encounter: Payer: Self-pay | Admitting: Pulmonary Disease

## 2021-10-26 ENCOUNTER — Telehealth: Payer: Self-pay

## 2021-10-26 VITALS — BP 120/80 | HR 57 | Temp 98.2°F | Ht 63.0 in | Wt 217.8 lb

## 2021-10-26 DIAGNOSIS — I350 Nonrheumatic aortic (valve) stenosis: Secondary | ICD-10-CM

## 2021-10-26 DIAGNOSIS — J4540 Moderate persistent asthma, uncomplicated: Secondary | ICD-10-CM | POA: Diagnosis not present

## 2021-10-26 DIAGNOSIS — M059 Rheumatoid arthritis with rheumatoid factor, unspecified: Secondary | ICD-10-CM

## 2021-10-26 DIAGNOSIS — R053 Chronic cough: Secondary | ICD-10-CM

## 2021-10-26 MED ORDER — TRELEGY ELLIPTA 100-62.5-25 MCG/ACT IN AEPB: 1.0000 | INHALATION_SPRAY | Freq: Every day | RESPIRATORY_TRACT | 0 refills | Status: DC

## 2021-10-26 MED ORDER — TRELEGY ELLIPTA 100-62.5-25 MCG/ACT IN AEPB: 1.0000 | INHALATION_SPRAY | Freq: Every day | RESPIRATORY_TRACT | 11 refills | Status: DC

## 2021-10-26 NOTE — Telephone Encounter (Signed)
Patient dropped off patient assistance form for Trelegy. Rx has been placed in Dr. Domingo Dimes folder for signature. Patient would like to pickup Rx and application once completed.

## 2021-10-26 NOTE — Progress Notes (Signed)
Subjective:    Patient ID: Jessica Kaufman, female    DOB: January 25, 1925, 88 y.o.   MRN: [id number] Patient Care Team: Ailene Ravel, MD as PCP - General (Family Medicine) End, Cristal Deer, MD as PCP - Cardiology (Cardiology)  Chief Complaint  Patient presents with   Follow-up    SOB with exertion. Cough with brownish sputum.   HPI Jessica Kaufman is a 88 year old lifelong never smoker who presents for follow-up on the issue of asthma and asthmatic bronchitis.  She was last seen here on 31 August 2021.  At that time she was noted to have good control of her symptoms with Trelegy Ellipta 100.  At that time she was noted to have a cough that had had a recent exacerbation due to to URI.  However since then this has resolved.  She has been having difficulties obtaining Trelegy Ellipta and her insurance company wanted her to use Advair.  She has not started this medication.  She would like to stay on Trelegy as she feels this has helped her the most.  Recall that she had an episode of atrial fibrillation with RVR and has been noted to have aortic stenosis, she follows with cardiology.  She converted spontaneously and has not had recurrence of her atrial fibs.  She has not had any orthopnea.  She has been having issues with lower extremity edema but this has improved with furosemide twice daily.  She states that she feels she is improved from prior. Has not had any shortness of breath.  Occasionally has tenacious sputum, Mucinex helps with this. She does not endorse any other symptomatology today.    She has not had any shortness of breath at rest.  She has chronic shortness of breath with exertion but this has not gotten any worse and overall she feels is better with the Trelegy.  She has occasional cough that is productive of yellowish sputum but for the most part her cough has pretty much resolved.  No chest pain.  No lower extremity edema or calf tenderness.  She does not endorse any other  symptomatology.   Review of Systems A 10 point review of systems was performed and it is as noted above otherwise negative.  Patient Active Problem List   Diagnosis Date Noted   Hypertension    Asthma    Chronic heart failure with preserved ejection fraction (HFpEF) (HCC) 03/11/2021   Edema 11/05/2020   Atrial fibrillation with RVR (HCC) 05/07/2020   Gout    Hypothyroidism    Rheumatoid arthritis (HCC)    Syncope    Elevated troponin    Hypomagnesemia    Atypical chest pain 10/03/2019   Moderate aortic stenosis 10/03/2019   Palpitations 04/04/2019   Dizziness 04/04/2019   PSVT (paroxysmal supraventricular tachycardia) 04/04/2019   Near syncope 02/20/2019   Labile hypertension 12/12/2017   Paroxysmal atrial fibrillation (HCC) 12/12/2017   Aortic valve disease 12/12/2017   Social History   Tobacco Use   Smoking status: Never   Smokeless tobacco: Never  Substance Use Topics   Alcohol use: No   Allergies  Allergen Reactions   Metronidazole Other (See Comments)    RASH   Sulindac Other (See Comments)    UNKNOWN   Naproxen Rash   Current Meds  Medication Sig   acetaminophen (TYLENOL) 650 MG CR tablet Take 650 mg by mouth every 8 (eight) hours as needed for pain.   Albuterol Sulfate (PROAIR RESPICLICK) 108 (90 Base) MCG/ACT AEPB Inhale 2 puffs  into the lungs every 6 (six) hours as needed.   amoxicillin (AMOXIL) 500 MG tablet Take 4 tablets (2,000 mg total) by mouth 2 (two) times daily. For dental procedures only   apixaban (ELIQUIS) 5 MG TABS tablet Take 5 mg by mouth 2 (two) times daily.    b complex vitamins tablet Take 1 tablet by mouth daily.   Bioflavonoid Products (BIOFLEX) TABS Take 2 tablets by mouth daily.   carvedilol (COREG) 6.25 MG tablet Take 1 tablet (6.25 mg total) by mouth 2 (two) times daily.   Cholecalciferol (VITAMIN D3) 1000 UNITS CAPS Take 1 capsule by mouth daily.   Coenzyme Q10 (COQ10) 100 MG CAPS Take 1 capsule by mouth daily.   CRANBERRY PO  Take 1 tablet by mouth daily.   famotidine (PEPCID) 20 MG tablet Take 20 mg by mouth daily.   fexofenadine (ALLEGRA) 180 MG tablet Take 180 mg by mouth daily.   fluticasone-salmeterol (ADVAIR DISKUS) 250-50 MCG/ACT AEPB Inhale 1 puff into the lungs in the morning and at bedtime.   Fluticasone-Umeclidin-Vilant (TRELEGY ELLIPTA) 100-62.5-25 MCG/ACT AEPB Inhale 1 puff into the lungs daily.   furosemide (LASIX) 40 MG tablet Take 0.5 tablets (20 mg total) by mouth as directed. Take half a tablet (20 mg) once daily   gabapentin (NEURONTIN) 600 MG tablet Take 600 mg by mouth 2 (two) times daily.   Ginger, Zingiber officinalis, (GINGER ROOT PO) Take by mouth daily.   guaiFENesin (MUCINEX) 600 MG 12 hr tablet Take 600 mg by mouth daily.   hydrALAZINE (APRESOLINE) 25 MG tablet TAKE 1 TABLET BY MOUTH ONCE A DAY AND TWICE A DAY AS NEEDED FOR SYSTOLIC BP OF 160 OR GREATER   levothyroxine (SYNTHROID, LEVOTHROID) 50 MCG tablet Take 1 tablet by mouth daily.   losartan (COZAAR) 50 MG tablet Take 1 tablet (50 mg total) by mouth 2 (two) times daily.   Multiple Vitamins-Minerals (CENTRUM SILVER PO) Take 1 tablet by mouth daily.   Multiple Vitamins-Minerals (PRESERVISION AREDS 2 PO) Take 1 capsule by mouth 2 (two) times daily.    Omega-3 Fatty Acids (FISH OIL) 1000 MG CAPS Take 2 capsules by mouth daily.   PREMARIN vaginal cream Place 1 Applicatorful vaginally in the morning and at bedtime.    psyllium (METAMUCIL) 58.6 % powder Take 1 packet by mouth daily.   TURMERIC PO Take by mouth daily.  *Has not started Advair, still on Trelegy. Does not want to switch to Advair.  Immunization History  Administered Date(s) Administered   Influenza, High Dose Seasonal PF 10/25/2018   Influenza, Quadrivalent, Recombinant, Inj, Pf 12/01/2020   Influenza-Unspecified 09/26/2021   Moderna SARS-COV2 Booster Vaccination 01/08/2020   Moderna Sars-Covid-2 Vaccination 01/19/2019, 02/15/2019   Tdap 03/19/2021   Zoster Recombinat  (Shingrix) 05/17/2017, 07/24/2017      Objective:   Physical Exam BP 120/80 (BP Location: Right Arm, Cuff Size: Large)   Pulse (!) 57   Temp 98.2 F (36.8 C)   Ht 5\' 3"  (1.6 m)   Wt 217 lb 12.8 oz (98.8 kg)   SpO2 95%   BMI 38.58 kg/m   SpO2: 95 % O2 Device: None (Room air)  GENERAL: Obese woman, no acute distress.  No conversational dyspnea.  Ambulates with walker. HEAD: Normocephalic, atraumatic.  Slight right facial droop noted from Bell's palsy. EYES: Pupils equal, round, reactive to light.  No scleral icterus.  MOUTH: Nose/mouth/throat not examined due to masking requirements for COVID 19. NECK: Supple. No thyromegaly. Trachea midline. No JVD.  No  adenopathy. PULMONARY: Good air entry bilaterally.  No adventitious sounds. CARDIOVASCULAR: S1 and S2. Regular rate and rhythm.  Grade 3/6 holosystolic murmur at right sternal border consistent with AS. ABDOMEN: Obese, otherwise benign. MUSCULOSKELETAL: No joint deformity, no clubbing, no edema.  NEUROLOGIC: Right facial droop noted (Bell's palsy), speech is fluent. SKIN: Intact,warm,dry.  On limited exam, no rashes. PSYCH: Mood and behavior normal.     Assessment & Plan:     ICD-10-CM   1. Moderate persistent asthmatic bronchitis without complication  J45.40    Well-controlled with Trelegy Ellipta 100 Continue Trelegy Patient does not want to use Advair    2. Chronic cough  R05.3    Markedly improved on Trelegy Continue same    3. Seropositive rheumatoid arthritis (HCC)  M05.9    This issue adds complexity to her management Follows with rheumatology No associated ILD    4. Aortic valve stenosis, etiology of cardiac valve disease unspecified  I35.0    This issue adds complexity to her management Follows with cardiology     Meds ordered this encounter  Medications   Fluticasone-Umeclidin-Vilant (TRELEGY ELLIPTA) 100-62.5-25 MCG/ACT AEPB    Sig: Inhale 1 puff into the lungs daily.    Dispense:  28 each     Refill:  0    Order Specific Question:   Lot Number?    Answer:   [id number]    Order Specific Question:   Expiration Date?    Answer:   04/04/2023    Order Specific Question:   Quantity    Answer:   2   Will see the patient in follow-up in 3 to 4 months time call sooner should any new problems arise.  Gailen Shelter, MD Advanced Bronchoscopy PCCM [facility]    *This note was dictated using voice recognition software/Dragon.  Despite best efforts to proofread, errors can occur which can change the meaning. Any transcriptional errors that result from this process are unintentional and may not be fully corrected at the time of dictation.

## 2021-10-26 NOTE — Patient Instructions (Signed)
We have given you some more samples of the Trelegy Ellipta.  I recommend that you do get the RSV vaccine this [facility] but check with your insurance or with your assisted living to see if they are providing it.  It should be at no cost to you.  We will see you in follow-up in 3 to 4 months time call sooner should any new problems arise.

## 2021-10-27 DIAGNOSIS — E559 Vitamin D deficiency, unspecified: Secondary | ICD-10-CM | POA: Diagnosis not present

## 2021-10-27 DIAGNOSIS — R600 Localized edema: Secondary | ICD-10-CM | POA: Diagnosis not present

## 2021-10-27 DIAGNOSIS — G629 Polyneuropathy, unspecified: Secondary | ICD-10-CM | POA: Diagnosis not present

## 2021-10-27 DIAGNOSIS — G319 Degenerative disease of nervous system, unspecified: Secondary | ICD-10-CM | POA: Diagnosis not present

## 2021-10-27 DIAGNOSIS — M069 Rheumatoid arthritis, unspecified: Secondary | ICD-10-CM | POA: Diagnosis not present

## 2021-10-27 DIAGNOSIS — H6123 Impacted cerumen, bilateral: Secondary | ICD-10-CM | POA: Diagnosis not present

## 2021-10-27 DIAGNOSIS — Z7951 Long term (current) use of inhaled steroids: Secondary | ICD-10-CM | POA: Diagnosis not present

## 2021-10-27 DIAGNOSIS — N1832 Chronic kidney disease, stage 3b: Secondary | ICD-10-CM | POA: Diagnosis not present

## 2021-10-27 DIAGNOSIS — H3530 Unspecified macular degeneration: Secondary | ICD-10-CM | POA: Diagnosis not present

## 2021-10-27 DIAGNOSIS — G4733 Obstructive sleep apnea (adult) (pediatric): Secondary | ICD-10-CM | POA: Diagnosis not present

## 2021-10-27 DIAGNOSIS — M810 Age-related osteoporosis without current pathological fracture: Secondary | ICD-10-CM | POA: Diagnosis not present

## 2021-10-27 DIAGNOSIS — I130 Hypertensive heart and chronic kidney disease with heart failure and stage 1 through stage 4 chronic kidney disease, or unspecified chronic kidney disease: Secondary | ICD-10-CM | POA: Diagnosis not present

## 2021-10-27 DIAGNOSIS — M170 Bilateral primary osteoarthritis of knee: Secondary | ICD-10-CM | POA: Diagnosis not present

## 2021-10-27 DIAGNOSIS — I480 Paroxysmal atrial fibrillation: Secondary | ICD-10-CM | POA: Diagnosis not present

## 2021-10-27 DIAGNOSIS — R32 Unspecified urinary incontinence: Secondary | ICD-10-CM | POA: Diagnosis not present

## 2021-10-27 DIAGNOSIS — I471 Supraventricular tachycardia, unspecified: Secondary | ICD-10-CM | POA: Diagnosis not present

## 2021-10-27 DIAGNOSIS — I350 Nonrheumatic aortic (valve) stenosis: Secondary | ICD-10-CM | POA: Diagnosis not present

## 2021-10-27 DIAGNOSIS — R42 Dizziness and giddiness: Secondary | ICD-10-CM | POA: Diagnosis not present

## 2021-10-27 DIAGNOSIS — Z7901 Long term (current) use of anticoagulants: Secondary | ICD-10-CM | POA: Diagnosis not present

## 2021-10-27 DIAGNOSIS — I7121 Aneurysm of the ascending aorta, without rupture: Secondary | ICD-10-CM | POA: Diagnosis not present

## 2021-10-27 DIAGNOSIS — E039 Hypothyroidism, unspecified: Secondary | ICD-10-CM | POA: Diagnosis not present

## 2021-10-27 DIAGNOSIS — E7800 Pure hypercholesterolemia, unspecified: Secondary | ICD-10-CM | POA: Diagnosis not present

## 2021-10-27 DIAGNOSIS — I5032 Chronic diastolic (congestive) heart failure: Secondary | ICD-10-CM | POA: Diagnosis not present

## 2021-10-27 DIAGNOSIS — M109 Gout, unspecified: Secondary | ICD-10-CM | POA: Diagnosis not present

## 2021-10-27 DIAGNOSIS — K219 Gastro-esophageal reflux disease without esophagitis: Secondary | ICD-10-CM | POA: Diagnosis not present

## 2021-10-28 MED ORDER — TRELEGY ELLIPTA 100-62.5-25 MCG/ACT IN AEPB: 1.0000 | INHALATION_SPRAY | Freq: Every day | RESPIRATORY_TRACT | 11 refills | Status: DC

## 2021-10-28 NOTE — Telephone Encounter (Signed)
A prescription has been printed and signed. I have placed it with the patient's paperwork and placed it at the front desk for her to pick up. I have notified the patient.

## 2021-11-02 DIAGNOSIS — I5032 Chronic diastolic (congestive) heart failure: Secondary | ICD-10-CM | POA: Diagnosis not present

## 2021-11-02 DIAGNOSIS — E039 Hypothyroidism, unspecified: Secondary | ICD-10-CM | POA: Diagnosis not present

## 2021-11-05 DIAGNOSIS — M170 Bilateral primary osteoarthritis of knee: Secondary | ICD-10-CM | POA: Diagnosis not present

## 2021-11-05 DIAGNOSIS — Z7901 Long term (current) use of anticoagulants: Secondary | ICD-10-CM | POA: Diagnosis not present

## 2021-11-05 DIAGNOSIS — H6123 Impacted cerumen, bilateral: Secondary | ICD-10-CM | POA: Diagnosis not present

## 2021-11-05 DIAGNOSIS — I471 Supraventricular tachycardia, unspecified: Secondary | ICD-10-CM | POA: Diagnosis not present

## 2021-11-05 DIAGNOSIS — I480 Paroxysmal atrial fibrillation: Secondary | ICD-10-CM | POA: Diagnosis not present

## 2021-11-05 DIAGNOSIS — M810 Age-related osteoporosis without current pathological fracture: Secondary | ICD-10-CM | POA: Diagnosis not present

## 2021-11-05 DIAGNOSIS — I7121 Aneurysm of the ascending aorta, without rupture: Secondary | ICD-10-CM | POA: Diagnosis not present

## 2021-11-05 DIAGNOSIS — E559 Vitamin D deficiency, unspecified: Secondary | ICD-10-CM | POA: Diagnosis not present

## 2021-11-05 DIAGNOSIS — E039 Hypothyroidism, unspecified: Secondary | ICD-10-CM | POA: Diagnosis not present

## 2021-11-05 DIAGNOSIS — I130 Hypertensive heart and chronic kidney disease with heart failure and stage 1 through stage 4 chronic kidney disease, or unspecified chronic kidney disease: Secondary | ICD-10-CM | POA: Diagnosis not present

## 2021-11-05 DIAGNOSIS — I5032 Chronic diastolic (congestive) heart failure: Secondary | ICD-10-CM | POA: Diagnosis not present

## 2021-11-05 DIAGNOSIS — G4733 Obstructive sleep apnea (adult) (pediatric): Secondary | ICD-10-CM | POA: Diagnosis not present

## 2021-11-05 DIAGNOSIS — Z7951 Long term (current) use of inhaled steroids: Secondary | ICD-10-CM | POA: Diagnosis not present

## 2021-11-05 DIAGNOSIS — R32 Unspecified urinary incontinence: Secondary | ICD-10-CM | POA: Diagnosis not present

## 2021-11-05 DIAGNOSIS — I350 Nonrheumatic aortic (valve) stenosis: Secondary | ICD-10-CM | POA: Diagnosis not present

## 2021-11-05 DIAGNOSIS — G629 Polyneuropathy, unspecified: Secondary | ICD-10-CM | POA: Diagnosis not present

## 2021-11-05 DIAGNOSIS — R42 Dizziness and giddiness: Secondary | ICD-10-CM | POA: Diagnosis not present

## 2021-11-05 DIAGNOSIS — H3530 Unspecified macular degeneration: Secondary | ICD-10-CM | POA: Diagnosis not present

## 2021-11-05 DIAGNOSIS — G319 Degenerative disease of nervous system, unspecified: Secondary | ICD-10-CM | POA: Diagnosis not present

## 2021-11-05 DIAGNOSIS — N1832 Chronic kidney disease, stage 3b: Secondary | ICD-10-CM | POA: Diagnosis not present

## 2021-11-05 DIAGNOSIS — M109 Gout, unspecified: Secondary | ICD-10-CM | POA: Diagnosis not present

## 2021-11-05 DIAGNOSIS — E7800 Pure hypercholesterolemia, unspecified: Secondary | ICD-10-CM | POA: Diagnosis not present

## 2021-11-05 DIAGNOSIS — M069 Rheumatoid arthritis, unspecified: Secondary | ICD-10-CM | POA: Diagnosis not present

## 2021-11-05 DIAGNOSIS — K219 Gastro-esophageal reflux disease without esophagitis: Secondary | ICD-10-CM | POA: Diagnosis not present

## 2021-11-09 DIAGNOSIS — G4733 Obstructive sleep apnea (adult) (pediatric): Secondary | ICD-10-CM | POA: Diagnosis not present

## 2021-11-09 DIAGNOSIS — Z6841 Body Mass Index (BMI) 40.0 and over, adult: Secondary | ICD-10-CM | POA: Diagnosis not present

## 2021-11-09 DIAGNOSIS — J449 Chronic obstructive pulmonary disease, unspecified: Secondary | ICD-10-CM | POA: Diagnosis not present

## 2021-11-09 DIAGNOSIS — Z515 Encounter for palliative care: Secondary | ICD-10-CM | POA: Diagnosis not present

## 2021-11-09 DIAGNOSIS — I480 Paroxysmal atrial fibrillation: Secondary | ICD-10-CM | POA: Diagnosis not present

## 2021-11-09 DIAGNOSIS — I10 Essential (primary) hypertension: Secondary | ICD-10-CM | POA: Diagnosis not present

## 2021-11-09 DIAGNOSIS — D692 Other nonthrombocytopenic purpura: Secondary | ICD-10-CM | POA: Diagnosis not present

## 2021-11-10 ENCOUNTER — Telehealth: Payer: Self-pay | Admitting: Pulmonary Disease

## 2021-11-10 DIAGNOSIS — H353211 Exudative age-related macular degeneration, right eye, with active choroidal neovascularization: Secondary | ICD-10-CM | POA: Diagnosis not present

## 2021-11-10 MED ORDER — TRELEGY ELLIPTA 100-62.5-25 MCG/ACT IN AEPB: 1.0000 | INHALATION_SPRAY | Freq: Every day | RESPIRATORY_TRACT | 0 refills | Status: DC

## 2021-11-10 NOTE — Telephone Encounter (Signed)
Received a call from pt's daughter who is requesting to have Trelegy samples for pt. Routing to [employer] for them to look into this for pt.

## 2021-11-10 NOTE — Telephone Encounter (Signed)
2 samples have been placed at the front desk for the patient's daughter to pick up. I have notified the patient's daughter (DPR). Nothing further needed.

## 2021-11-11 DIAGNOSIS — Z7901 Long term (current) use of anticoagulants: Secondary | ICD-10-CM | POA: Diagnosis not present

## 2021-11-11 DIAGNOSIS — G629 Polyneuropathy, unspecified: Secondary | ICD-10-CM | POA: Diagnosis not present

## 2021-11-11 DIAGNOSIS — I5032 Chronic diastolic (congestive) heart failure: Secondary | ICD-10-CM | POA: Diagnosis not present

## 2021-11-11 DIAGNOSIS — G319 Degenerative disease of nervous system, unspecified: Secondary | ICD-10-CM | POA: Diagnosis not present

## 2021-11-11 DIAGNOSIS — H3530 Unspecified macular degeneration: Secondary | ICD-10-CM | POA: Diagnosis not present

## 2021-11-11 DIAGNOSIS — I471 Supraventricular tachycardia, unspecified: Secondary | ICD-10-CM | POA: Diagnosis not present

## 2021-11-11 DIAGNOSIS — E559 Vitamin D deficiency, unspecified: Secondary | ICD-10-CM | POA: Diagnosis not present

## 2021-11-11 DIAGNOSIS — N1832 Chronic kidney disease, stage 3b: Secondary | ICD-10-CM | POA: Diagnosis not present

## 2021-11-11 DIAGNOSIS — E7800 Pure hypercholesterolemia, unspecified: Secondary | ICD-10-CM | POA: Diagnosis not present

## 2021-11-11 DIAGNOSIS — G4733 Obstructive sleep apnea (adult) (pediatric): Secondary | ICD-10-CM | POA: Diagnosis not present

## 2021-11-11 DIAGNOSIS — I480 Paroxysmal atrial fibrillation: Secondary | ICD-10-CM | POA: Diagnosis not present

## 2021-11-11 DIAGNOSIS — M810 Age-related osteoporosis without current pathological fracture: Secondary | ICD-10-CM | POA: Diagnosis not present

## 2021-11-11 DIAGNOSIS — M170 Bilateral primary osteoarthritis of knee: Secondary | ICD-10-CM | POA: Diagnosis not present

## 2021-11-11 DIAGNOSIS — I130 Hypertensive heart and chronic kidney disease with heart failure and stage 1 through stage 4 chronic kidney disease, or unspecified chronic kidney disease: Secondary | ICD-10-CM | POA: Diagnosis not present

## 2021-11-11 DIAGNOSIS — I7121 Aneurysm of the ascending aorta, without rupture: Secondary | ICD-10-CM | POA: Diagnosis not present

## 2021-11-11 DIAGNOSIS — E039 Hypothyroidism, unspecified: Secondary | ICD-10-CM | POA: Diagnosis not present

## 2021-11-11 DIAGNOSIS — R32 Unspecified urinary incontinence: Secondary | ICD-10-CM | POA: Diagnosis not present

## 2021-11-11 DIAGNOSIS — K219 Gastro-esophageal reflux disease without esophagitis: Secondary | ICD-10-CM | POA: Diagnosis not present

## 2021-11-11 DIAGNOSIS — I350 Nonrheumatic aortic (valve) stenosis: Secondary | ICD-10-CM | POA: Diagnosis not present

## 2021-11-11 DIAGNOSIS — Z7951 Long term (current) use of inhaled steroids: Secondary | ICD-10-CM | POA: Diagnosis not present

## 2021-11-11 DIAGNOSIS — M109 Gout, unspecified: Secondary | ICD-10-CM | POA: Diagnosis not present

## 2021-11-11 DIAGNOSIS — M069 Rheumatoid arthritis, unspecified: Secondary | ICD-10-CM | POA: Diagnosis not present

## 2021-11-11 DIAGNOSIS — H6123 Impacted cerumen, bilateral: Secondary | ICD-10-CM | POA: Diagnosis not present

## 2021-11-11 DIAGNOSIS — R42 Dizziness and giddiness: Secondary | ICD-10-CM | POA: Diagnosis not present

## 2021-11-12 ENCOUNTER — Telehealth: Payer: Self-pay

## 2021-11-12 NOTE — Telephone Encounter (Signed)
Error

## 2021-11-16 DIAGNOSIS — Z7901 Long term (current) use of anticoagulants: Secondary | ICD-10-CM | POA: Diagnosis not present

## 2021-11-16 DIAGNOSIS — G629 Polyneuropathy, unspecified: Secondary | ICD-10-CM | POA: Diagnosis not present

## 2021-11-16 DIAGNOSIS — R32 Unspecified urinary incontinence: Secondary | ICD-10-CM | POA: Diagnosis not present

## 2021-11-16 DIAGNOSIS — E559 Vitamin D deficiency, unspecified: Secondary | ICD-10-CM | POA: Diagnosis not present

## 2021-11-16 DIAGNOSIS — E039 Hypothyroidism, unspecified: Secondary | ICD-10-CM | POA: Diagnosis not present

## 2021-11-16 DIAGNOSIS — I5032 Chronic diastolic (congestive) heart failure: Secondary | ICD-10-CM | POA: Diagnosis not present

## 2021-11-16 DIAGNOSIS — M810 Age-related osteoporosis without current pathological fracture: Secondary | ICD-10-CM | POA: Diagnosis not present

## 2021-11-16 DIAGNOSIS — M109 Gout, unspecified: Secondary | ICD-10-CM | POA: Diagnosis not present

## 2021-11-16 DIAGNOSIS — I7121 Aneurysm of the ascending aorta, without rupture: Secondary | ICD-10-CM | POA: Diagnosis not present

## 2021-11-16 DIAGNOSIS — M170 Bilateral primary osteoarthritis of knee: Secondary | ICD-10-CM | POA: Diagnosis not present

## 2021-11-16 DIAGNOSIS — H3530 Unspecified macular degeneration: Secondary | ICD-10-CM | POA: Diagnosis not present

## 2021-11-16 DIAGNOSIS — I480 Paroxysmal atrial fibrillation: Secondary | ICD-10-CM | POA: Diagnosis not present

## 2021-11-16 DIAGNOSIS — R42 Dizziness and giddiness: Secondary | ICD-10-CM | POA: Diagnosis not present

## 2021-11-16 DIAGNOSIS — I350 Nonrheumatic aortic (valve) stenosis: Secondary | ICD-10-CM | POA: Diagnosis not present

## 2021-11-16 DIAGNOSIS — N1832 Chronic kidney disease, stage 3b: Secondary | ICD-10-CM | POA: Diagnosis not present

## 2021-11-16 DIAGNOSIS — M069 Rheumatoid arthritis, unspecified: Secondary | ICD-10-CM | POA: Diagnosis not present

## 2021-11-16 DIAGNOSIS — K219 Gastro-esophageal reflux disease without esophagitis: Secondary | ICD-10-CM | POA: Diagnosis not present

## 2021-11-16 DIAGNOSIS — E7800 Pure hypercholesterolemia, unspecified: Secondary | ICD-10-CM | POA: Diagnosis not present

## 2021-11-16 DIAGNOSIS — I130 Hypertensive heart and chronic kidney disease with heart failure and stage 1 through stage 4 chronic kidney disease, or unspecified chronic kidney disease: Secondary | ICD-10-CM | POA: Diagnosis not present

## 2021-11-16 DIAGNOSIS — Z7951 Long term (current) use of inhaled steroids: Secondary | ICD-10-CM | POA: Diagnosis not present

## 2021-11-16 DIAGNOSIS — H6123 Impacted cerumen, bilateral: Secondary | ICD-10-CM | POA: Diagnosis not present

## 2021-11-16 DIAGNOSIS — G319 Degenerative disease of nervous system, unspecified: Secondary | ICD-10-CM | POA: Diagnosis not present

## 2021-11-16 DIAGNOSIS — G4733 Obstructive sleep apnea (adult) (pediatric): Secondary | ICD-10-CM | POA: Diagnosis not present

## 2021-11-16 DIAGNOSIS — I471 Supraventricular tachycardia, unspecified: Secondary | ICD-10-CM | POA: Diagnosis not present

## 2021-11-17 ENCOUNTER — Telehealth: Payer: Self-pay

## 2021-11-17 MED ORDER — FUROSEMIDE 40 MG PO TABS: ORAL_TABLET | ORAL | 1 refills | Status: DC

## 2021-11-17 NOTE — Telephone Encounter (Signed)
Refill sent for Furosemide  

## 2021-11-23 ENCOUNTER — Telehealth: Payer: Self-pay | Admitting: Internal Medicine

## 2021-11-23 MED ORDER — FUROSEMIDE 40 MG PO TABS: ORAL_TABLET | ORAL | 3 refills | Status: DC

## 2021-11-23 NOTE — Telephone Encounter (Signed)
  Pt c/o medication issue:  1. Name of Medication: furosemide (LASIX) 40 MG tablet   2. How are you currently taking this medication (dosage and times per day)?   Take Furosemide 40 mg one tablet every am & one-half (20 mg) tablet at noon.    3. Are you having a reaction (difficulty breathing--STAT)? No   4. What is your medication issue? Missy with [facility] pharmacy calling to confirm dosage for this medication

## 2021-11-23 NOTE — Telephone Encounter (Signed)
Spoke with daughter to confirm what dose patient is taking and she is taking Furosemide 40 mg in the AM and then 20 mg at noon. Provider updated and no further needs.

## 2021-11-23 NOTE — Telephone Encounter (Signed)
Unable to reach anyone to clarify dosing and new prescription sent to the pharmacy.   Furosemide 40 mg take 1 tablet (40 mg) in the AM and take half tablet (20 mg) at noon.

## 2021-11-23 NOTE — Telephone Encounter (Signed)
July note suggests that she was taking lasix 20 mg bid however more recent EP note says 40 in the AM and 20 in the PM.  I think you should refill it as she says she is taking it.

## 2021-11-27 DIAGNOSIS — R600 Localized edema: Secondary | ICD-10-CM | POA: Diagnosis not present

## 2021-11-27 DIAGNOSIS — I5032 Chronic diastolic (congestive) heart failure: Secondary | ICD-10-CM | POA: Diagnosis not present

## 2021-11-29 ENCOUNTER — Other Ambulatory Visit: Payer: Self-pay

## 2021-11-29 ENCOUNTER — Telehealth: Payer: Self-pay | Admitting: Internal Medicine

## 2021-11-29 ENCOUNTER — Telehealth: Payer: Self-pay | Admitting: Pulmonary Disease

## 2021-11-29 MED ORDER — FUROSEMIDE 40 MG PO TABS: ORAL_TABLET | ORAL | 0 refills | Status: DC

## 2021-11-29 MED ORDER — TRELEGY ELLIPTA 100-62.5-25 MCG/ACT IN AEPB: 1.0000 | INHALATION_SPRAY | Freq: Every day | RESPIRATORY_TRACT | 0 refills | Status: DC

## 2021-11-29 NOTE — Telephone Encounter (Signed)
*  STAT* If patient is at the pharmacy, call can be transferred to refill team.   1. Which medications need to be refilled? (please list name of each medication and dose if known) new prescription for local pharmacy for Furosemide  2. Which pharmacy/location (including street and city if local pharmacy) is [facility] to [facility] sent to[facility] [address] [place], [place]  3. Do they need a 30 day or 90 day supply? 14 days- need this called in today- completely out

## 2021-11-29 NOTE — Telephone Encounter (Signed)
Called and spoke to patient's daughter, Kathy(DPR). [name] is requesting one sample of trelegy 100 to get patient by until she received determination about patient assistance.  One sample has been placed up front.  Nothing further needed.

## 2021-12-01 MED ORDER — FUROSEMIDE 40 MG PO TABS: ORAL_TABLET | ORAL | 3 refills | Status: DC

## 2021-12-01 NOTE — Addendum Note (Signed)
Addended by: Valora Corporal on: 12/01/2021 05:08 PM   Modules accepted: Orders

## 2021-12-01 NOTE — Telephone Encounter (Signed)
Called to review clarification and they stated someone had already called and clarified. Discussed that I would like to review as well to just confirm we have correct instructions. She reports it is Furosemide 40 mg in the AM and 20 mg in the PM. Repeated back to her for confirmation and she had no further questions or needs at this time.

## 2021-12-01 NOTE — Telephone Encounter (Signed)
Spoke with daughter and medication was sent to wrong pharmacy. Reordered medication for mail order, and called other pharmacy to not fill. Then called mail order pharmacy to review and request refill for patient. Mail order pharmacy checked prescription and they have received it but the patient must call to verify mailing address and review insurance information. Will reach back out to patients daughter to provide update.

## 2021-12-01 NOTE — Telephone Encounter (Signed)
Pt c/o medication issue:  1. Name of Medication:  furosemide (LASIX) 40 MG tablet  2. How are you currently taking this medication (dosage and times per day)?   3. Are you having a reaction (difficulty breathing--STAT)?   4. What is your medication issue?   Patient's daughter is following up stating there was a mix-up and the pharmacy had a prescription for 1/2 tablet daily. She is hopeful to have this corrected to the 40 mg in the AM and 20 mg in the PM Rx sent through [facility] ASAP. She states the patient is needing a 90 day supply.

## 2021-12-01 NOTE — Telephone Encounter (Signed)
Left voicemail message stating that mail order pharmacy is requiring patient to call in order to verify some information before they can send the medication out.  Instructed to call back if they need any further assistance.

## 2021-12-20 DIAGNOSIS — B351 Tinea unguium: Secondary | ICD-10-CM | POA: Diagnosis not present

## 2021-12-20 DIAGNOSIS — I7091 Generalized atherosclerosis: Secondary | ICD-10-CM | POA: Diagnosis not present

## 2021-12-21 NOTE — Progress Notes (Signed)
Follow-up Outpatient Visit Date: 12/22/2021  Primary Care Provider: Leonides Sake, [place] [address]  Chief Complaint: Follow-up labile hypertension and atrial fibrillation  HPI:  Jessica Kaufman is a 88 y.o. female with history of labile hypertension, paroxysmal atrial fibrillation, mild to moderate aortic stenosis, PVCs, hyperlipidemia, and rheumatoid arthritis, who presents for follow-up of hypertension, aortic stenosis, and atrial fibrillation.  She was last seen in our office by Christell Faith, PA, in July, at which time she was doing fairly well following an episode of syncope in the setting of atrial fibrillation with rapid ventricular response earlier in the month.  She spontaneously converted to sinus rhythm in the ED.  She was feeling well at follow-up.  Event monitor was placed, which showed sinus rhythm with PAF and posttermination pauses of up to 3.3 seconds.  She saw Dr. Caryl Comes in the EP clinic in the late September, at which time carvedilol was increased to 6.25 mg twice daily, as it was felt that her syncopal episode with hypotension in July was primarily vagally mediated.  Today, Jessica Kaufman reports that she has been feeling fairly well.  Her main complaint is of intermittent back pain, especially when she stands up and tries to move around.  She has not fallen or injured her back to her knowledge.  Blood pressures at home remain quite labile, with readings ranging from 91/54 to 206/107 over the last week or 2.  She continues to take as needed hydralazine in addition to her other standing medications.  She reports feeling tired and maybe a little dizzy when her blood pressure was 91/54.  She denies recurrent syncope or other significant dizziness.  She does not weigh herself regularly.  Leg edema has been minimal.  She denies chest pain, shortness of breath, and  palpitations.  --------------------------------------------------------------------------------------------------  Past Medical History:  Diagnosis Date   Aortic valve disease    Mild-moderate aortic stenosis   Asthma    Cancer (HCC)    basal cell    Gout    Hyperlipidemia    Hypertension    Hypertensive heart disease without heart failure    Hypothyroidism    PAF (paroxysmal atrial fibrillation) (HCC)    PVC (premature ventricular contraction)    Rheumatoid arthritis ([place])    Ventricular premature depolarization    Past Surgical History:  Procedure Laterality Date   BLADDER SURGERY  10/19/2010   BONY PELVIS SURGERY  1990   BREAST BIOPSY Left 1986   CARDIAC SURGERY  2009   PVC   CATARACT EXTRACTION Left 11/15/00   CATARACT EXTRACTION Right    ELBOW SURGERY  06/1990   HIP SURGERY Left 05/28/09   HIP SURGERY Right 06/30/03   HYSTEROSCOPY  06/17/1986   vaginal    Current Meds  Medication Sig   acetaminophen (TYLENOL) 650 MG CR tablet Take 650 mg by mouth every 8 (eight) hours as needed for pain.   Albuterol Sulfate (PROAIR RESPICLICK) 297 (90 Base) MCG/ACT AEPB Inhale 2 puffs into the lungs every 6 (six) hours as needed.   amoxicillin (AMOXIL) 500 MG tablet Take 4 tablets (2,000 mg total) by mouth 2 (two) times daily. For dental procedures only   apixaban (ELIQUIS) 5 MG TABS tablet Take 5 mg by mouth 2 (two) times daily.    b complex vitamins tablet Take 1 tablet by mouth daily.   Bioflavonoid Products (BIOFLEX) TABS Take 2 tablets by mouth daily.   carvedilol (COREG) 6.25 MG tablet Take  1 tablet (6.25 mg total) by mouth 2 (two) times daily.   Cholecalciferol (VITAMIN D3) 1000 UNITS CAPS Take 1 capsule by mouth daily.   Coenzyme Q10 (COQ10) 100 MG CAPS Take 1 capsule by mouth daily.   CRANBERRY PO Take 1 tablet by mouth daily.   famotidine (PEPCID) 20 MG tablet Take 20 mg by mouth daily.   fexofenadine (ALLEGRA) 180 MG tablet Take 180 mg by mouth daily.    Fluticasone-Umeclidin-Vilant (TRELEGY ELLIPTA) 100-62.5-25 MCG/ACT AEPB Inhale 1 puff into the lungs daily.   furosemide (LASIX) 40 MG tablet Take Furosemide 40 mg one tablet every am & one-half (20 mg) tablet at noon.   gabapentin (NEURONTIN) 600 MG tablet Take 600 mg by mouth 2 (two) times daily.   Ginger, Zingiber officinalis, (GINGER ROOT PO) Take by mouth daily.   guaiFENesin (MUCINEX) 600 MG 12 hr tablet Take 600 mg by mouth daily.   hydrALAZINE (APRESOLINE) 25 MG tablet TAKE 1 TABLET BY MOUTH ONCE A DAY AND TWICE A DAY AS NEEDED FOR SYSTOLIC BP OF 161 OR GREATER   levothyroxine (SYNTHROID, LEVOTHROID) 50 MCG tablet Take 1 tablet by mouth daily.   losartan (COZAAR) 50 MG tablet Take 1 tablet (50 mg total) by mouth 2 (two) times daily.   Multiple Vitamins-Minerals (CENTRUM SILVER PO) Take 1 tablet by mouth daily.   Multiple Vitamins-Minerals (PRESERVISION AREDS 2 PO) Take 1 capsule by mouth 2 (two) times daily.    Omega-3 Fatty Acids (FISH OIL) 1000 MG CAPS Take 2 capsules by mouth daily.   PREMARIN vaginal cream Place 1 Applicatorful vaginally in the morning and at bedtime.    psyllium (METAMUCIL) 58.6 % powder Take 1 packet by mouth daily.   TURMERIC PO Take by mouth daily.    Allergies: Metronidazole, Sulindac, and Naproxen  Social History   Tobacco Use   Smoking status: Never   Smokeless tobacco: Never  Vaping Use   Vaping Use: Never used  Substance Use Topics   Alcohol use: No   Drug use: No    Family History  Problem Relation Age of Onset   Stroke Mother    Melanoma Mother    Heart attack Father    Uterine cancer Sister    Melanoma Sister    Heart failure Sister    Heart attack Maternal Grandmother    Breast cancer Daughter     Review of Systems: A 12-system review of systems was performed and was negative except as noted in the HPI.  --------------------------------------------------------------------------------------------------  Physical Exam: BP 132/68  (BP Location: Right Arm, Patient Position: Sitting, Cuff Size: Large)   Pulse (!) 54   Ht '5\' 3"'$  (1.6 m)   Wt 217 lb (98.4 kg)   SpO2 96%   BMI 38.44 kg/m   General:  NAD, accompanied by her daughter. Neck: No JVD or HJR. Lungs: Clear to auscultation bilaterally without wheezes or crackles. Heart: Bradycardic but regular with 2/6 systolic murmur. Abdomen: Soft, nontender, nondistended. Extremities: No lower extremity edema with compression stockings in place.  EKG: Sinus bradycardia with borderline LVH and nonspecific ST/T wave changes.  Heart rate has decreased since 10/01/2021.  Otherwise, no significant interval change.  Lab Results  Component Value Date   WBC 6.6 07/04/2021   HGB 12.8 07/04/2021   HCT 39.3 07/04/2021   MCV 93.3 07/04/2021   PLT 325 07/04/2021    Lab Results  Component Value Date   NA 137 07/04/2021   K 3.6 07/04/2021   CL 101 07/04/2021  CO2 26 07/04/2021   BUN 19 07/04/2021   CREATININE 0.76 07/04/2021   GLUCOSE 131 (H) 07/04/2021   ALT 18 07/04/2021    Lab Results  Component Value Date   CHOL 199 05/08/2020   HDL 59 05/08/2020   LDLCALC 122 (H) 05/08/2020   TRIG 88 05/08/2020   CHOLHDL 3.4 05/08/2020    --------------------------------------------------------------------------------------------------  ASSESSMENT AND PLAN: Labile hypertension: Blood pressure remains quite variable, though only 1 particularly low reading has been recorded over the last week or 2.  We will continue her current doses of carvedilol, losartan, and as needed hydralazine.  As Jessica Kaufman has minimal leg edema and is at risk for dehydration, we have agreed to decrease furosemide to 20 mg p.o. twice daily.  Aortic stenosis: Mild to moderate by most recent echo in 01/2021.  Recommend repeating an echo in a year, sooner if new symptoms develop.  Paroxysmal atrial fibrillation: Jessica Kaufman is in sinus rhythm today.  Continue apixaban and current dose of  carvedilol.  Syncope: Felt to be most consistent with vasovagal mechanism, though posttermination pauses up to 3.3 seconds noted on prior event monitor.  Follow-up with Dr. Caryl Comes as previously arranged.  Follow-up: Return to clinic in 3 months.  Nelva Bush, MD 12/22/2021 12:31 PM

## 2021-12-22 ENCOUNTER — Ambulatory Visit: Payer: PPO | Attending: Internal Medicine | Admitting: Internal Medicine

## 2021-12-22 ENCOUNTER — Encounter: Payer: Self-pay | Admitting: Internal Medicine

## 2021-12-22 VITALS — BP 132/68 | HR 54 | Ht 63.0 in | Wt 217.0 lb

## 2021-12-22 DIAGNOSIS — I480 Paroxysmal atrial fibrillation: Secondary | ICD-10-CM | POA: Diagnosis not present

## 2021-12-22 DIAGNOSIS — R0989 Other specified symptoms and signs involving the circulatory and respiratory systems: Secondary | ICD-10-CM

## 2021-12-22 DIAGNOSIS — R55 Syncope and collapse: Secondary | ICD-10-CM

## 2021-12-22 DIAGNOSIS — I350 Nonrheumatic aortic (valve) stenosis: Secondary | ICD-10-CM

## 2021-12-22 NOTE — Patient Instructions (Signed)
Medication Instructions:  Your physician recommends the following medication changes.  DECREASE: Lasix to 20 mg in the morning and evening (Please call when you're ready to send an updated prescription to your pharmacy)   *If you need a refill on your cardiac medications before your next appointment, please call your pharmacy*   Lab Work: None ordered today   Testing/Procedures: None ordered today   Follow-Up: At [facility], you and your health needs are our priority.  As part of our continuing mission to provide you with exceptional heart care, we have created designated Provider Care Teams.  These Care Teams include your primary Cardiologist (physician) and Advanced Practice Providers (APPs -  Physician Assistants and Nurse Practitioners) who all work together to provide you with the care you need, when you need it.  We recommend signing up for the patient portal called "MyChart".  Sign up information is provided on this After Visit Summary.  MyChart is used to connect with patients for Virtual Visits (Telemedicine).  Patients are able to view lab/test results, encounter notes, upcoming appointments, etc.  Non-urgent messages can be sent to your provider as well.   To learn more about what you can do with MyChart, go to [email or web address].    Your next appointment:   3 month(s)  The format for your next appointment:   In Person  Provider:   You may see Nelva Bush, MD or one of the following Advanced Practice Providers on your designated Care Team:   Murray Hodgkins, NP Christell Faith, PA-C Cadence Kathlen Mody, PA-C Gerrie Nordmann, NP

## 2021-12-27 DIAGNOSIS — R600 Localized edema: Secondary | ICD-10-CM | POA: Diagnosis not present

## 2021-12-27 DIAGNOSIS — I5032 Chronic diastolic (congestive) heart failure: Secondary | ICD-10-CM | POA: Diagnosis not present

## 2022-01-07 ENCOUNTER — Other Ambulatory Visit: Payer: Self-pay

## 2022-01-07 MED ORDER — TRELEGY ELLIPTA 100-62.5-25 MCG/ACT IN AEPB: 1.0000 | INHALATION_SPRAY | Freq: Every day | RESPIRATORY_TRACT | 0 refills | Status: DC

## 2022-01-19 DIAGNOSIS — G511 Geniculate ganglionitis: Secondary | ICD-10-CM | POA: Diagnosis not present

## 2022-01-19 DIAGNOSIS — H353211 Exudative age-related macular degeneration, right eye, with active choroidal neovascularization: Secondary | ICD-10-CM | POA: Diagnosis not present

## 2022-01-19 DIAGNOSIS — H353122 Nonexudative age-related macular degeneration, left eye, intermediate dry stage: Secondary | ICD-10-CM | POA: Diagnosis not present

## 2022-01-19 DIAGNOSIS — H02831 Dermatochalasis of right upper eyelid: Secondary | ICD-10-CM | POA: Diagnosis not present

## 2022-01-25 ENCOUNTER — Telehealth: Payer: Self-pay | Admitting: Internal Medicine

## 2022-01-25 NOTE — Telephone Encounter (Signed)
*  STAT* If patient is at the pharmacy, call can be transferred to refill team.   1. Which medications need to be refilled? (please list name of each medication and dose if known) hydrALAZINE (APRESOLINE) 25 MG tablet   2. Which pharmacy/location (including street and city if local pharmacy) is medication to be sent to? [facility] - [place], [place]   3. Do they need a 30 day or 90 day supply? [place]

## 2022-01-26 MED ORDER — HYDRALAZINE HCL 25 MG PO TABS: ORAL_TABLET | ORAL | 0 refills | Status: DC

## 2022-01-27 ENCOUNTER — Encounter: Payer: Self-pay | Admitting: Internal Medicine

## 2022-01-27 ENCOUNTER — Ambulatory Visit: Payer: PPO | Attending: Internal Medicine | Admitting: Internal Medicine

## 2022-01-27 VITALS — BP 150/76 | HR 59 | Ht 63.0 in | Wt 219.1 lb

## 2022-01-27 DIAGNOSIS — R55 Syncope and collapse: Secondary | ICD-10-CM

## 2022-01-27 DIAGNOSIS — I10 Essential (primary) hypertension: Secondary | ICD-10-CM

## 2022-01-27 DIAGNOSIS — I5032 Chronic diastolic (congestive) heart failure: Secondary | ICD-10-CM

## 2022-01-27 DIAGNOSIS — I480 Paroxysmal atrial fibrillation: Secondary | ICD-10-CM | POA: Diagnosis not present

## 2022-01-27 MED ORDER — HYDRALAZINE HCL 25 MG PO TABS: ORAL_TABLET | ORAL | 0 refills | Status: DC

## 2022-01-27 NOTE — Patient Instructions (Addendum)
Medication Instructions:  - Your physician has recommended you make the following change in your medication:   1) CHANGE Hydralazine 25 mg: - take 1 tablet up to three times a day as needed for systolic blood pressure >833  *If you need a refill on your cardiac medications before your next appointment, please call your pharmacy*   Lab Work: - none ordered  If you have labs (blood work) drawn today and your tests are completely normal, you will receive your results only by: [place] (if you have MyChart) OR A paper copy in the mail If you have any lab test that is abnormal or we need to change your treatment, we will call you to review the results.   Testing/Procedures: - none ordered   Follow-Up: At [facility], you and your health needs are our priority.  As part of our continuing mission to provide you with exceptional heart care, we have created designated Provider Care Teams.  These Care Teams include your primary Cardiologist (physician) and Advanced Practice Providers (APPs -  Physician Assistants and Nurse Practitioners) who all work together to provide you with the care you need, when you need it.  We recommend signing up for the patient portal called "MyChart".  Sign up information is provided on this After Visit Summary.  MyChart is used to connect with patients for Virtual Visits (Telemedicine).  Patients are able to view lab/test results, encounter notes, upcoming appointments, etc.  Non-urgent messages can be sent to your provider as well.   To learn more about what you can do with MyChart, go to [email or web address].    Your next appointment:   1) As scheduled with Christell Faith, PA  2) As needed with Dr. Caryl Comes   Provider:   As above     Other Instructions N/a

## 2022-01-27 NOTE — Progress Notes (Signed)
Patient Care Team: Hamrick, Lorin Mercy, MD as PCP - General (Family Medicine) End, Harrell Gave, MD as PCP - Cardiology (Cardiology) Tyler Pita, MD as Consulting Physician (Pulmonary Disease)   HPI  Jessica Kaufman is a 89 y.o. female Seen in follow-up for an episode of syncope in the context of paroxysms of atrial fibrillation associated with relatively modest posttermination pauses (less than 4 seconds).  EMS reports from her syncopal episode had protracted hypotension not withstanding initially normal heart rates, followed by higher heart rates in the context of ongoing hypotension.  I have interpreted this as a neurally mediated event and not a posttermination event.  No interval events  Some orthostatic LH takes hydralazine BP >160   Also feels unusual for BP >200    DATE TEST EF    3/21 Echo   55-65 %    1/23 Echo  60-65% AoV mean grad 15 AVA 1.6cm2    Date Cr K Hgb  7/23 0.76 3.6 12.8              Records and Results Reviewed   Past Medical History:  Diagnosis Date   Aortic valve disease    Mild-moderate aortic stenosis   Asthma    Cancer (HCC)    basal cell    Gout    Hyperlipidemia    Hypertension    Hypertensive heart disease without heart failure    Hypothyroidism    PAF (paroxysmal atrial fibrillation) (HCC)    PVC (premature ventricular contraction)    Rheumatoid arthritis ([place])    Ventricular premature depolarization     Past Surgical History:  Procedure Laterality Date   BLADDER SURGERY  10/19/2010   BONY PELVIS SURGERY  1990   BREAST BIOPSY Left 1986   CARDIAC SURGERY  2009   PVC   CATARACT EXTRACTION Left 11/15/00   CATARACT EXTRACTION Right    ELBOW SURGERY  06/1990   HIP SURGERY Left 05/28/09   HIP SURGERY Right 06/30/03   HYSTEROSCOPY  06/17/1986   vaginal    Current Meds  Medication Sig   acetaminophen (TYLENOL) 650 MG CR tablet Take 650 mg by mouth every 8 (eight) hours as needed for pain.   Albuterol Sulfate  (PROAIR RESPICLICK) 938 (90 Base) MCG/ACT AEPB Inhale 2 puffs into the lungs every 6 (six) hours as needed.   amoxicillin (AMOXIL) 500 MG tablet Take 4 tablets (2,000 mg total) by mouth 2 (two) times daily. For dental procedures only   apixaban (ELIQUIS) 5 MG TABS tablet Take 5 mg by mouth 2 (two) times daily.    b complex vitamins tablet Take 1 tablet by mouth daily.   Bioflavonoid Products (BIOFLEX) TABS Take 2 tablets by mouth daily.   carvedilol (COREG) 6.25 MG tablet Take 1 tablet (6.25 mg total) by mouth 2 (two) times daily.   Cholecalciferol (VITAMIN D3) 1000 UNITS CAPS Take 1 capsule by mouth daily.   Coenzyme Q10 (COQ10) 100 MG CAPS Take 1 capsule by mouth daily.   CRANBERRY PO Take 1 tablet by mouth daily.   famotidine (PEPCID) 20 MG tablet Take 20 mg by mouth daily.   fexofenadine (ALLEGRA) 180 MG tablet Take 180 mg by mouth daily.   Fluticasone-Umeclidin-Vilant (TRELEGY ELLIPTA) 100-62.5-25 MCG/ACT AEPB Inhale 1 puff into the lungs daily.   furosemide (LASIX) 40 MG tablet Take Furosemide 40 mg one tablet every am & one-half (20 mg) tablet at noon.   gabapentin (NEURONTIN) 600 MG tablet Take  600 mg by mouth 2 (two) times daily.   Ginger, Zingiber officinalis, (GINGER ROOT PO) Take by mouth daily.   guaiFENesin (MUCINEX) 600 MG 12 hr tablet Take 600 mg by mouth daily.   hydrALAZINE (APRESOLINE) 25 MG tablet TAKE 1 TABLET BY MOUTH ONCE A DAY AND TWICE A DAY AS NEEDED FOR SYSTOLIC BP OF 412 OR GREATER   levothyroxine (SYNTHROID, LEVOTHROID) 50 MCG tablet Take 1 tablet by mouth daily.   losartan (COZAAR) 50 MG tablet Take 1 tablet (50 mg total) by mouth 2 (two) times daily.   Multiple Vitamins-Minerals (CENTRUM SILVER PO) Take 1 tablet by mouth daily.   Multiple Vitamins-Minerals (PRESERVISION AREDS 2 PO) Take 1 capsule by mouth 2 (two) times daily.    Omega-3 Fatty Acids (FISH OIL) 1000 MG CAPS Take 2 capsules by mouth daily.   PREMARIN vaginal cream Place 1 Applicatorful vaginally in  the morning and at bedtime.    psyllium (METAMUCIL) 58.6 % powder Take 1 packet by mouth daily.   TURMERIC PO Take by mouth daily.    Allergies  Allergen Reactions   Metronidazole Other (See Comments)    RASH   Sulindac Other (See Comments)    UNKNOWN   Naproxen Rash      Review of Systems negative except from HPI and PMH  Physical Exam BP (!) 150/76 (BP Location: Left Arm, Patient Position: Sitting, Cuff Size: Normal)   Pulse (!) 59   Ht '5\' 3"'$  (1.6 m)   Wt 219 lb 2 oz (99.4 kg)   SpO2 97%   BMI 38.82 kg/m  Well developed and well nourished in no acute distress HENT normal E scleral and icterus clear Neck Supple JVP flat; carotids brisk and full Clear to ausculation Regular rate and rhythm, no murmurs gallops or rub Soft with active bowel sounds No clubbing cyanosis tr Edema Alert and oriented, grossly normal motor and sensory function Skin Warm and Dry  ECG sinus @ 59 20/09/42  CrCl cannot be calculated (Patient's most recent lab result is older than the maximum 21 days allowed.).   Assessment and  Plan Syncope   Falls-mechanical   Atrial fibrillation-recurrent   Hypertension/orthostatic LH    Posttermination pauses less than 3.5 seconds if she hit her head.  I told her she is not having these falls repeatedly apparently I told her we see her on the  No interval events, but with orthostatic lightheadedness, we will increase her threshold for hydralazine from 160--170 with the goals of 180 if her blood pressure issues at the high end are not symptomatic.  I think that the greater risk to her is a fall compared to the marginal benefit of decreasing blood pressure from 170--150  So if she has recurrent events will consider recorder loop recorder implantation  No bleeding on the apixaban, continue 5 mg twice daily dosed appropriately for weight and renal function  Current medicines are reviewed at length with the patient today .  The patient does not  have  concerns regarding medicines.

## 2022-01-27 NOTE — Addendum Note (Signed)
Addended by: Alvis Lemmings C on: 01/27/2022 01:57 PM   Modules accepted: Orders

## 2022-02-15 DIAGNOSIS — H04123 Dry eye syndrome of bilateral lacrimal glands: Secondary | ICD-10-CM | POA: Diagnosis not present

## 2022-02-15 DIAGNOSIS — H02884 Meibomian gland dysfunction left upper eyelid: Secondary | ICD-10-CM | POA: Diagnosis not present

## 2022-02-15 DIAGNOSIS — H02834 Dermatochalasis of left upper eyelid: Secondary | ICD-10-CM | POA: Diagnosis not present

## 2022-02-15 DIAGNOSIS — H02831 Dermatochalasis of right upper eyelid: Secondary | ICD-10-CM | POA: Diagnosis not present

## 2022-02-22 ENCOUNTER — Encounter: Payer: Self-pay | Admitting: Pulmonary Disease

## 2022-02-22 ENCOUNTER — Ambulatory Visit (INDEPENDENT_AMBULATORY_CARE_PROVIDER_SITE_OTHER): Payer: PPO | Admitting: Pulmonary Disease

## 2022-02-22 VITALS — BP 122/80 | HR 63 | Temp 97.7°F | Ht 63.0 in | Wt 220.4 lb

## 2022-02-22 DIAGNOSIS — J4540 Moderate persistent asthma, uncomplicated: Secondary | ICD-10-CM | POA: Diagnosis not present

## 2022-02-22 DIAGNOSIS — G4733 Obstructive sleep apnea (adult) (pediatric): Secondary | ICD-10-CM | POA: Diagnosis not present

## 2022-02-22 DIAGNOSIS — R053 Chronic cough: Secondary | ICD-10-CM

## 2022-02-22 LAB — NITRIC OXIDE: Nitric Oxide: 6

## 2022-02-22 MED ORDER — TRELEGY ELLIPTA 100-62.5-25 MCG/ACT IN AEPB: 1.0000 | INHALATION_SPRAY | Freq: Every day | RESPIRATORY_TRACT | 0 refills | Status: DC

## 2022-02-22 NOTE — Patient Instructions (Signed)
You are doing very well with your CPAP use.  Your asthma is very well-controlled.  Continue Trelegy 1 puff daily.  You may take the Mucinex only as needed.  We will see you in follow-up in 4 to 6 months time call sooner should any new problems arise.

## 2022-02-22 NOTE — Progress Notes (Signed)
Subjective:    Patient ID: Jessica Kaufman, female    DOB: [birth year], 89 y.o.   MRN: [id number] Patient Care Team: Leonides Sake, MD as PCP - General (Family Medicine) End, Harrell Gave, MD as PCP - Cardiology (Cardiology) Tyler Pita, MD as Consulting Physician (Pulmonary Disease)  Chief Complaint  Patient presents with   Follow-up    SOB with exertion. Cough in the mornings with white sputum. Occasional wheezing in the mornings.    HPI Jessica Kaufman is a 89 year old lifelong never smoker who presents for follow-up on the issue of asthma and asthmatic bronchitis. She was last seen here on 26 October 2021.  At that time she was noted to have a reexacerbation of her chronic cough due to sequela of viral infection.  This was resolving at that time.  Since then her cough has completely resolved she may have occasional cough in the morning.  Usually nonproductive.  Also usually not long-lasting.  She has not had any fevers, chills or sweats.  No tachypalpitations since her episode in July 2023.  She has not had any orthopnea.  Previously she had been having issues with lower extremity edema but this has improved with furosemide twice daily.  She states that she feels she is doing well. Has not had any shortness of breath.  Occasionally has tenacious sputum, Mucinex helps with this. She does not endorse any other symptomatology today.     She has had obstructive sleep apnea diagnosed several years ago.  She is on CPAP 4 to 20 cm H2O.  Appliance download shows 100% usage in the last 30 days with 29 of those days (97%) over 4 hours of use.  Child is 4.0.  Median pressure between 4.9 and 7.3 cm H2O.  He does well with the device and states that she cannot sleep without it.  Review of Systems A 10 point review of systems was performed and it is as noted above otherwise negative.  Patient Active Problem List   Diagnosis Date Noted   Hypertension    Asthma    Chronic heart failure with  preserved ejection fraction (HFpEF) ([place]) 03/11/2021   Edema 11/05/2020   Atrial fibrillation with RVR ([place]) 05/07/2020   Gout    Hypothyroidism    Rheumatoid arthritis (HCC)    Syncope and collapse    Elevated troponin    Hypomagnesemia    Atypical chest pain 10/03/2019   Nonrheumatic aortic valve stenosis 10/03/2019   Palpitations 04/04/2019   Dizziness 04/04/2019   PSVT (paroxysmal supraventricular tachycardia) 04/04/2019   Near syncope 02/20/2019   Labile hypertension 12/12/2017   Paroxysmal atrial fibrillation (HCC) 12/12/2017   Aortic valve disease 12/12/2017   Social History   Tobacco Use   Smoking status: Never   Smokeless tobacco: Never  Substance Use Topics   Alcohol use: No   Allergies  Allergen Reactions   Metronidazole Other (See Comments)    RASH   Sulindac Other (See Comments)    UNKNOWN   Naproxen Rash   Current Meds  Medication Sig   acetaminophen (TYLENOL) 650 MG CR tablet Take 650 mg by mouth every 8 (eight) hours as needed for pain.   Albuterol Sulfate (PROAIR RESPICLICK) 123XX123 (90 Base) MCG/ACT AEPB Inhale 2 puffs into the lungs every 6 (six) hours as needed.   amoxicillin (AMOXIL) 500 MG tablet Take 4 tablets (2,000 mg total) by mouth 2 (two) times daily. For dental procedures only   apixaban (ELIQUIS) 5 MG TABS tablet Take  5 mg by mouth 2 (two) times daily.    b complex vitamins tablet Take 1 tablet by mouth daily.   Bioflavonoid Products (BIOFLEX) TABS Take 2 tablets by mouth daily.   carvedilol (COREG) 6.25 MG tablet Take 1 tablet (6.25 mg total) by mouth 2 (two) times daily.   Cholecalciferol (VITAMIN D3) 1000 UNITS CAPS Take 1 capsule by mouth daily.   Coenzyme Q10 (COQ10) 100 MG CAPS Take 1 capsule by mouth daily.   CRANBERRY PO Take 1 tablet by mouth daily.   famotidine (PEPCID) 20 MG tablet Take 20 mg by mouth daily.   fexofenadine (ALLEGRA) 180 MG tablet Take 180 mg by mouth daily.   Fluticasone-Umeclidin-Vilant (TRELEGY ELLIPTA)  100-62.5-25 MCG/ACT AEPB Inhale 1 puff into the lungs daily.   furosemide (LASIX) 40 MG tablet Take Furosemide 40 mg one tablet every am & one-half (20 mg) tablet at noon.   gabapentin (NEURONTIN) 600 MG tablet Take 600 mg by mouth 2 (two) times daily.   Ginger, Zingiber officinalis, (GINGER ROOT PO) Take by mouth daily.   guaiFENesin (MUCINEX) 600 MG 12 hr tablet Take 600 mg by mouth daily.   hydrALAZINE (APRESOLINE) 25 MG tablet TAKE 1 TABLET (25 MG)  BY MOUTH UP TO THREE TIMES A DAY AS NEEDED FOR SYSTOLIC BP OF 123XX123 OR GREATER   levothyroxine (SYNTHROID, LEVOTHROID) 50 MCG tablet Take 1 tablet by mouth daily.   losartan (COZAAR) 50 MG tablet Take 1 tablet (50 mg total) by mouth 2 (two) times daily.   Multiple Vitamins-Minerals (CENTRUM SILVER PO) Take 1 tablet by mouth daily.   Multiple Vitamins-Minerals (PRESERVISION AREDS 2 PO) Take 1 capsule by mouth 2 (two) times daily.    Omega-3 Fatty Acids (FISH OIL) 1000 MG CAPS Take 2 capsules by mouth daily.   PREMARIN vaginal cream Place 1 Applicatorful vaginally in the morning and at bedtime.    psyllium (METAMUCIL) 58.6 % powder Take 1 packet by mouth daily.   TURMERIC PO Take by mouth daily.   Immunization History  Administered Date(s) Administered   Influenza, High Dose Seasonal PF 10/25/2018   Influenza, Quadrivalent, Recombinant, Inj, Pf 12/01/2020   Influenza-Unspecified 09/26/2021   Moderna SARS-COV2 Booster Vaccination 01/08/2020   Moderna Sars-Covid-2 Vaccination 01/19/2019, 02/15/2019   Tdap 03/19/2021   Zoster Recombinat (Shingrix) 05/17/2017, 07/24/2017       Objective:   Physical Exam BP 122/80 (BP Location: Right Arm, Cuff Size: Large)   Pulse 63   Temp 97.7 F (36.5 C)   Ht 5' 3"$  (1.6 m)   Wt 220 lb 6.4 oz (100 kg)   SpO2 98%   BMI 39.04 kg/m   SpO2: 98 % O2 Device: None (Room air)  GENERAL: Obese woman, no acute distress.  No conversational dyspnea.  Ambulates with walker. HEAD: Normocephalic, atraumatic.   Slight right facial droop noted from Bell's palsy. EYES: Pupils equal, round, reactive to light.  No scleral icterus.  MOUTH: Nose/mouth/throat not examined due to masking requirements for COVID 19. NECK: Supple. No thyromegaly. Trachea midline. No JVD.  No adenopathy. PULMONARY: Good air entry bilaterally.  No adventitious sounds. CARDIOVASCULAR: S1 and S2. Regular rate and rhythm.  Grade 2/6 to 3/6 holosystolic murmur at right sternal border consistent with AS. ABDOMEN: Obese, otherwise benign. MUSCULOSKELETAL: No joint deformity, no clubbing, no edema.  NEUROLOGIC: Right facial droop noted (Bell's palsy), speech is fluent. SKIN: Intact,warm,dry.  On limited exam, no rashes. PSYCH: Mood and behavior normal.  Lab Results  Component Value Date  NITRICOXIDE 6 02/22/2022      Assessment & Plan:     ICD-10-CM   1. Moderate persistent asthmatic bronchitis without complication  123456 Nitric oxide   Continue Trelegy Ellipta Continue as needed albuterol    2. Chronic cough  R05.3    No evidence of type II inflammation Markedly improved on Trelegy Likely related to asthma    3. OSA (obstructive sleep apnea)  G47.33    Patient compliant with CPAP Continue CPAP     Orders Placed This Encounter  Procedures   Nitric oxide   Meds ordered this encounter  Medications   Fluticasone-Umeclidin-Vilant (TRELEGY ELLIPTA) 100-62.5-25 MCG/ACT AEPB    Sig: Inhale 1 puff into the lungs daily.    Dispense:  28 each    Refill:  0    Order Specific Question:   Lot Number?    Answer:   [id number]    Order Specific Question:   Expiration Date?    Answer:   05/04/2023    Order Specific Question:   Manufacturer?    Answer:   [employer] [[id number]]    Order Specific Question:   Quantity    Answer:   2   Overall Jessica Kaufman is doing well.  Will see the patient in follow-up in 4 to 6 months time she is to contact us prior to that time should any new difficulties arise.  Renold Don, MD Advanced  Bronchoscopy P[facility]    *This note was dictated using voice recognition software/Dragon.  Despite best efforts to proofread, errors can occur which can change the meaning. Any transcriptional errors that result from this process are unintentional and may not be fully corrected at the time of dictation.

## 2022-03-16 DIAGNOSIS — R32 Unspecified urinary incontinence: Secondary | ICD-10-CM | POA: Diagnosis not present

## 2022-03-16 DIAGNOSIS — M069 Rheumatoid arthritis, unspecified: Secondary | ICD-10-CM | POA: Diagnosis not present

## 2022-03-16 DIAGNOSIS — I471 Supraventricular tachycardia, unspecified: Secondary | ICD-10-CM | POA: Diagnosis not present

## 2022-03-16 DIAGNOSIS — M170 Bilateral primary osteoarthritis of knee: Secondary | ICD-10-CM | POA: Diagnosis not present

## 2022-03-16 DIAGNOSIS — E039 Hypothyroidism, unspecified: Secondary | ICD-10-CM | POA: Diagnosis not present

## 2022-03-16 DIAGNOSIS — G319 Degenerative disease of nervous system, unspecified: Secondary | ICD-10-CM | POA: Diagnosis not present

## 2022-03-16 DIAGNOSIS — R29898 Other symptoms and signs involving the musculoskeletal system: Secondary | ICD-10-CM | POA: Diagnosis not present

## 2022-03-16 DIAGNOSIS — M109 Gout, unspecified: Secondary | ICD-10-CM | POA: Diagnosis not present

## 2022-03-16 DIAGNOSIS — Z79899 Other long term (current) drug therapy: Secondary | ICD-10-CM | POA: Diagnosis not present

## 2022-03-16 DIAGNOSIS — I5032 Chronic diastolic (congestive) heart failure: Secondary | ICD-10-CM | POA: Diagnosis not present

## 2022-03-16 DIAGNOSIS — I480 Paroxysmal atrial fibrillation: Secondary | ICD-10-CM | POA: Diagnosis not present

## 2022-03-22 DIAGNOSIS — H02402 Unspecified ptosis of left eyelid: Secondary | ICD-10-CM | POA: Diagnosis not present

## 2022-03-22 DIAGNOSIS — H57811 Brow ptosis, right: Secondary | ICD-10-CM | POA: Diagnosis not present

## 2022-03-22 DIAGNOSIS — H02834 Dermatochalasis of left upper eyelid: Secondary | ICD-10-CM | POA: Diagnosis not present

## 2022-03-22 DIAGNOSIS — H02831 Dermatochalasis of right upper eyelid: Secondary | ICD-10-CM | POA: Diagnosis not present

## 2022-03-23 DIAGNOSIS — K219 Gastro-esophageal reflux disease without esophagitis: Secondary | ICD-10-CM | POA: Diagnosis not present

## 2022-03-23 DIAGNOSIS — R42 Dizziness and giddiness: Secondary | ICD-10-CM | POA: Diagnosis not present

## 2022-03-23 DIAGNOSIS — Z7901 Long term (current) use of anticoagulants: Secondary | ICD-10-CM | POA: Diagnosis not present

## 2022-03-23 DIAGNOSIS — R32 Unspecified urinary incontinence: Secondary | ICD-10-CM | POA: Diagnosis not present

## 2022-03-23 DIAGNOSIS — E7800 Pure hypercholesterolemia, unspecified: Secondary | ICD-10-CM | POA: Diagnosis not present

## 2022-03-23 DIAGNOSIS — M170 Bilateral primary osteoarthritis of knee: Secondary | ICD-10-CM | POA: Diagnosis not present

## 2022-03-23 DIAGNOSIS — I480 Paroxysmal atrial fibrillation: Secondary | ICD-10-CM | POA: Diagnosis not present

## 2022-03-23 DIAGNOSIS — G319 Degenerative disease of nervous system, unspecified: Secondary | ICD-10-CM | POA: Diagnosis not present

## 2022-03-23 DIAGNOSIS — N182 Chronic kidney disease, stage 2 (mild): Secondary | ICD-10-CM | POA: Diagnosis not present

## 2022-03-23 DIAGNOSIS — M109 Gout, unspecified: Secondary | ICD-10-CM | POA: Diagnosis not present

## 2022-03-23 DIAGNOSIS — I130 Hypertensive heart and chronic kidney disease with heart failure and stage 1 through stage 4 chronic kidney disease, or unspecified chronic kidney disease: Secondary | ICD-10-CM | POA: Diagnosis not present

## 2022-03-23 DIAGNOSIS — I7121 Aneurysm of the ascending aorta, without rupture: Secondary | ICD-10-CM | POA: Diagnosis not present

## 2022-03-23 DIAGNOSIS — M810 Age-related osteoporosis without current pathological fracture: Secondary | ICD-10-CM | POA: Diagnosis not present

## 2022-03-23 DIAGNOSIS — I5032 Chronic diastolic (congestive) heart failure: Secondary | ICD-10-CM | POA: Diagnosis not present

## 2022-03-23 DIAGNOSIS — M169 Osteoarthritis of hip, unspecified: Secondary | ICD-10-CM | POA: Diagnosis not present

## 2022-03-23 DIAGNOSIS — I471 Supraventricular tachycardia, unspecified: Secondary | ICD-10-CM | POA: Diagnosis not present

## 2022-03-23 DIAGNOSIS — Z7951 Long term (current) use of inhaled steroids: Secondary | ICD-10-CM | POA: Diagnosis not present

## 2022-03-23 DIAGNOSIS — M069 Rheumatoid arthritis, unspecified: Secondary | ICD-10-CM | POA: Diagnosis not present

## 2022-03-23 DIAGNOSIS — E039 Hypothyroidism, unspecified: Secondary | ICD-10-CM | POA: Diagnosis not present

## 2022-03-23 DIAGNOSIS — Z87440 Personal history of urinary (tract) infections: Secondary | ICD-10-CM | POA: Diagnosis not present

## 2022-03-23 DIAGNOSIS — I350 Nonrheumatic aortic (valve) stenosis: Secondary | ICD-10-CM | POA: Diagnosis not present

## 2022-03-23 DIAGNOSIS — E1140 Type 2 diabetes mellitus with diabetic neuropathy, unspecified: Secondary | ICD-10-CM | POA: Diagnosis not present

## 2022-03-24 ENCOUNTER — Other Ambulatory Visit: Payer: Self-pay | Admitting: *Deleted

## 2022-03-24 MED ORDER — HYDRALAZINE HCL 25 MG PO TABS: ORAL_TABLET | ORAL | 0 refills | Status: DC

## 2022-03-25 ENCOUNTER — Ambulatory Visit: Payer: PPO | Admitting: Physician Assistant

## 2022-03-30 DIAGNOSIS — H353211 Exudative age-related macular degeneration, right eye, with active choroidal neovascularization: Secondary | ICD-10-CM | POA: Diagnosis not present

## 2022-04-06 DIAGNOSIS — R42 Dizziness and giddiness: Secondary | ICD-10-CM | POA: Diagnosis not present

## 2022-04-06 DIAGNOSIS — R32 Unspecified urinary incontinence: Secondary | ICD-10-CM | POA: Diagnosis not present

## 2022-04-06 DIAGNOSIS — I480 Paroxysmal atrial fibrillation: Secondary | ICD-10-CM | POA: Diagnosis not present

## 2022-04-06 DIAGNOSIS — I7121 Aneurysm of the ascending aorta, without rupture: Secondary | ICD-10-CM | POA: Diagnosis not present

## 2022-04-06 DIAGNOSIS — E1140 Type 2 diabetes mellitus with diabetic neuropathy, unspecified: Secondary | ICD-10-CM | POA: Diagnosis not present

## 2022-04-06 DIAGNOSIS — I130 Hypertensive heart and chronic kidney disease with heart failure and stage 1 through stage 4 chronic kidney disease, or unspecified chronic kidney disease: Secondary | ICD-10-CM | POA: Diagnosis not present

## 2022-04-06 DIAGNOSIS — Z7951 Long term (current) use of inhaled steroids: Secondary | ICD-10-CM | POA: Diagnosis not present

## 2022-04-06 DIAGNOSIS — M069 Rheumatoid arthritis, unspecified: Secondary | ICD-10-CM | POA: Diagnosis not present

## 2022-04-06 DIAGNOSIS — I471 Supraventricular tachycardia, unspecified: Secondary | ICD-10-CM | POA: Diagnosis not present

## 2022-04-06 DIAGNOSIS — E7800 Pure hypercholesterolemia, unspecified: Secondary | ICD-10-CM | POA: Diagnosis not present

## 2022-04-06 DIAGNOSIS — I350 Nonrheumatic aortic (valve) stenosis: Secondary | ICD-10-CM | POA: Diagnosis not present

## 2022-04-06 DIAGNOSIS — Z7901 Long term (current) use of anticoagulants: Secondary | ICD-10-CM | POA: Diagnosis not present

## 2022-04-06 DIAGNOSIS — I5032 Chronic diastolic (congestive) heart failure: Secondary | ICD-10-CM | POA: Diagnosis not present

## 2022-04-06 DIAGNOSIS — M109 Gout, unspecified: Secondary | ICD-10-CM | POA: Diagnosis not present

## 2022-04-06 DIAGNOSIS — M169 Osteoarthritis of hip, unspecified: Secondary | ICD-10-CM | POA: Diagnosis not present

## 2022-04-06 DIAGNOSIS — E039 Hypothyroidism, unspecified: Secondary | ICD-10-CM | POA: Diagnosis not present

## 2022-04-06 DIAGNOSIS — K219 Gastro-esophageal reflux disease without esophagitis: Secondary | ICD-10-CM | POA: Diagnosis not present

## 2022-04-06 DIAGNOSIS — M810 Age-related osteoporosis without current pathological fracture: Secondary | ICD-10-CM | POA: Diagnosis not present

## 2022-04-06 DIAGNOSIS — G319 Degenerative disease of nervous system, unspecified: Secondary | ICD-10-CM | POA: Diagnosis not present

## 2022-04-06 DIAGNOSIS — N182 Chronic kidney disease, stage 2 (mild): Secondary | ICD-10-CM | POA: Diagnosis not present

## 2022-04-06 DIAGNOSIS — Z87440 Personal history of urinary (tract) infections: Secondary | ICD-10-CM | POA: Diagnosis not present

## 2022-04-06 DIAGNOSIS — M170 Bilateral primary osteoarthritis of knee: Secondary | ICD-10-CM | POA: Diagnosis not present

## 2022-04-08 DIAGNOSIS — I130 Hypertensive heart and chronic kidney disease with heart failure and stage 1 through stage 4 chronic kidney disease, or unspecified chronic kidney disease: Secondary | ICD-10-CM | POA: Diagnosis not present

## 2022-04-08 DIAGNOSIS — I480 Paroxysmal atrial fibrillation: Secondary | ICD-10-CM | POA: Diagnosis not present

## 2022-04-08 DIAGNOSIS — R42 Dizziness and giddiness: Secondary | ICD-10-CM | POA: Diagnosis not present

## 2022-04-08 DIAGNOSIS — E039 Hypothyroidism, unspecified: Secondary | ICD-10-CM | POA: Diagnosis not present

## 2022-04-08 DIAGNOSIS — Z7951 Long term (current) use of inhaled steroids: Secondary | ICD-10-CM | POA: Diagnosis not present

## 2022-04-08 DIAGNOSIS — R32 Unspecified urinary incontinence: Secondary | ICD-10-CM | POA: Diagnosis not present

## 2022-04-08 DIAGNOSIS — M169 Osteoarthritis of hip, unspecified: Secondary | ICD-10-CM | POA: Diagnosis not present

## 2022-04-08 DIAGNOSIS — G319 Degenerative disease of nervous system, unspecified: Secondary | ICD-10-CM | POA: Diagnosis not present

## 2022-04-08 DIAGNOSIS — I471 Supraventricular tachycardia, unspecified: Secondary | ICD-10-CM | POA: Diagnosis not present

## 2022-04-08 DIAGNOSIS — M109 Gout, unspecified: Secondary | ICD-10-CM | POA: Diagnosis not present

## 2022-04-08 DIAGNOSIS — E1140 Type 2 diabetes mellitus with diabetic neuropathy, unspecified: Secondary | ICD-10-CM | POA: Diagnosis not present

## 2022-04-08 DIAGNOSIS — N182 Chronic kidney disease, stage 2 (mild): Secondary | ICD-10-CM | POA: Diagnosis not present

## 2022-04-08 DIAGNOSIS — M170 Bilateral primary osteoarthritis of knee: Secondary | ICD-10-CM | POA: Diagnosis not present

## 2022-04-08 DIAGNOSIS — I350 Nonrheumatic aortic (valve) stenosis: Secondary | ICD-10-CM | POA: Diagnosis not present

## 2022-04-08 DIAGNOSIS — M069 Rheumatoid arthritis, unspecified: Secondary | ICD-10-CM | POA: Diagnosis not present

## 2022-04-08 DIAGNOSIS — Z87440 Personal history of urinary (tract) infections: Secondary | ICD-10-CM | POA: Diagnosis not present

## 2022-04-08 DIAGNOSIS — M810 Age-related osteoporosis without current pathological fracture: Secondary | ICD-10-CM | POA: Diagnosis not present

## 2022-04-08 DIAGNOSIS — I7121 Aneurysm of the ascending aorta, without rupture: Secondary | ICD-10-CM | POA: Diagnosis not present

## 2022-04-08 DIAGNOSIS — E7800 Pure hypercholesterolemia, unspecified: Secondary | ICD-10-CM | POA: Diagnosis not present

## 2022-04-08 DIAGNOSIS — Z7901 Long term (current) use of anticoagulants: Secondary | ICD-10-CM | POA: Diagnosis not present

## 2022-04-08 DIAGNOSIS — K219 Gastro-esophageal reflux disease without esophagitis: Secondary | ICD-10-CM | POA: Diagnosis not present

## 2022-04-08 DIAGNOSIS — I5032 Chronic diastolic (congestive) heart failure: Secondary | ICD-10-CM | POA: Diagnosis not present

## 2022-04-14 DIAGNOSIS — E1140 Type 2 diabetes mellitus with diabetic neuropathy, unspecified: Secondary | ICD-10-CM | POA: Diagnosis not present

## 2022-04-14 DIAGNOSIS — I350 Nonrheumatic aortic (valve) stenosis: Secondary | ICD-10-CM | POA: Diagnosis not present

## 2022-04-14 DIAGNOSIS — I5032 Chronic diastolic (congestive) heart failure: Secondary | ICD-10-CM | POA: Diagnosis not present

## 2022-04-14 DIAGNOSIS — M109 Gout, unspecified: Secondary | ICD-10-CM | POA: Diagnosis not present

## 2022-04-14 DIAGNOSIS — I480 Paroxysmal atrial fibrillation: Secondary | ICD-10-CM | POA: Diagnosis not present

## 2022-04-14 DIAGNOSIS — E7800 Pure hypercholesterolemia, unspecified: Secondary | ICD-10-CM | POA: Diagnosis not present

## 2022-04-14 DIAGNOSIS — Z87440 Personal history of urinary (tract) infections: Secondary | ICD-10-CM | POA: Diagnosis not present

## 2022-04-14 DIAGNOSIS — R42 Dizziness and giddiness: Secondary | ICD-10-CM | POA: Diagnosis not present

## 2022-04-14 DIAGNOSIS — M169 Osteoarthritis of hip, unspecified: Secondary | ICD-10-CM | POA: Diagnosis not present

## 2022-04-14 DIAGNOSIS — Z7951 Long term (current) use of inhaled steroids: Secondary | ICD-10-CM | POA: Diagnosis not present

## 2022-04-14 DIAGNOSIS — I130 Hypertensive heart and chronic kidney disease with heart failure and stage 1 through stage 4 chronic kidney disease, or unspecified chronic kidney disease: Secondary | ICD-10-CM | POA: Diagnosis not present

## 2022-04-14 DIAGNOSIS — M810 Age-related osteoporosis without current pathological fracture: Secondary | ICD-10-CM | POA: Diagnosis not present

## 2022-04-14 DIAGNOSIS — N182 Chronic kidney disease, stage 2 (mild): Secondary | ICD-10-CM | POA: Diagnosis not present

## 2022-04-14 DIAGNOSIS — I7121 Aneurysm of the ascending aorta, without rupture: Secondary | ICD-10-CM | POA: Diagnosis not present

## 2022-04-14 DIAGNOSIS — M170 Bilateral primary osteoarthritis of knee: Secondary | ICD-10-CM | POA: Diagnosis not present

## 2022-04-14 DIAGNOSIS — I471 Supraventricular tachycardia, unspecified: Secondary | ICD-10-CM | POA: Diagnosis not present

## 2022-04-14 DIAGNOSIS — G319 Degenerative disease of nervous system, unspecified: Secondary | ICD-10-CM | POA: Diagnosis not present

## 2022-04-14 DIAGNOSIS — K219 Gastro-esophageal reflux disease without esophagitis: Secondary | ICD-10-CM | POA: Diagnosis not present

## 2022-04-14 DIAGNOSIS — M069 Rheumatoid arthritis, unspecified: Secondary | ICD-10-CM | POA: Diagnosis not present

## 2022-04-14 DIAGNOSIS — R32 Unspecified urinary incontinence: Secondary | ICD-10-CM | POA: Diagnosis not present

## 2022-04-14 DIAGNOSIS — Z7901 Long term (current) use of anticoagulants: Secondary | ICD-10-CM | POA: Diagnosis not present

## 2022-04-14 DIAGNOSIS — E039 Hypothyroidism, unspecified: Secondary | ICD-10-CM | POA: Diagnosis not present

## 2022-04-19 DIAGNOSIS — M109 Gout, unspecified: Secondary | ICD-10-CM | POA: Diagnosis not present

## 2022-04-19 DIAGNOSIS — E1140 Type 2 diabetes mellitus with diabetic neuropathy, unspecified: Secondary | ICD-10-CM | POA: Diagnosis not present

## 2022-04-19 DIAGNOSIS — K219 Gastro-esophageal reflux disease without esophagitis: Secondary | ICD-10-CM | POA: Diagnosis not present

## 2022-04-19 DIAGNOSIS — I5032 Chronic diastolic (congestive) heart failure: Secondary | ICD-10-CM | POA: Diagnosis not present

## 2022-04-19 DIAGNOSIS — M810 Age-related osteoporosis without current pathological fracture: Secondary | ICD-10-CM | POA: Diagnosis not present

## 2022-04-19 DIAGNOSIS — I350 Nonrheumatic aortic (valve) stenosis: Secondary | ICD-10-CM | POA: Diagnosis not present

## 2022-04-19 DIAGNOSIS — Z7951 Long term (current) use of inhaled steroids: Secondary | ICD-10-CM | POA: Diagnosis not present

## 2022-04-19 DIAGNOSIS — I7121 Aneurysm of the ascending aorta, without rupture: Secondary | ICD-10-CM | POA: Diagnosis not present

## 2022-04-19 DIAGNOSIS — I130 Hypertensive heart and chronic kidney disease with heart failure and stage 1 through stage 4 chronic kidney disease, or unspecified chronic kidney disease: Secondary | ICD-10-CM | POA: Diagnosis not present

## 2022-04-19 DIAGNOSIS — N182 Chronic kidney disease, stage 2 (mild): Secondary | ICD-10-CM | POA: Diagnosis not present

## 2022-04-19 DIAGNOSIS — M169 Osteoarthritis of hip, unspecified: Secondary | ICD-10-CM | POA: Diagnosis not present

## 2022-04-19 DIAGNOSIS — Z87440 Personal history of urinary (tract) infections: Secondary | ICD-10-CM | POA: Diagnosis not present

## 2022-04-19 DIAGNOSIS — I471 Supraventricular tachycardia, unspecified: Secondary | ICD-10-CM | POA: Diagnosis not present

## 2022-04-19 DIAGNOSIS — R42 Dizziness and giddiness: Secondary | ICD-10-CM | POA: Diagnosis not present

## 2022-04-19 DIAGNOSIS — I480 Paroxysmal atrial fibrillation: Secondary | ICD-10-CM | POA: Diagnosis not present

## 2022-04-19 DIAGNOSIS — M170 Bilateral primary osteoarthritis of knee: Secondary | ICD-10-CM | POA: Diagnosis not present

## 2022-04-19 DIAGNOSIS — R32 Unspecified urinary incontinence: Secondary | ICD-10-CM | POA: Diagnosis not present

## 2022-04-19 DIAGNOSIS — E7800 Pure hypercholesterolemia, unspecified: Secondary | ICD-10-CM | POA: Diagnosis not present

## 2022-04-19 DIAGNOSIS — Z7901 Long term (current) use of anticoagulants: Secondary | ICD-10-CM | POA: Diagnosis not present

## 2022-04-19 DIAGNOSIS — G319 Degenerative disease of nervous system, unspecified: Secondary | ICD-10-CM | POA: Diagnosis not present

## 2022-04-19 DIAGNOSIS — M069 Rheumatoid arthritis, unspecified: Secondary | ICD-10-CM | POA: Diagnosis not present

## 2022-04-19 DIAGNOSIS — E039 Hypothyroidism, unspecified: Secondary | ICD-10-CM | POA: Diagnosis not present

## 2022-04-23 NOTE — Progress Notes (Unsigned)
Cardiology Office Note    Date:  04/26/2022   ID:  Jessica Kaufman, DOB 1925/02/28, MRN [id number]  PCP:  Ailene Ravel, MD  Cardiologist:  Yvonne Kendall, MD  Electrophysiologist:  None   Chief Complaint: Follow-up  History of Present Illness:   Jessica Kaufman is a 89 y.o. female with history of PAF, HFpEF, labile hypertension, mild to moderate aortic stenosis, syncope, history of CVA noted on CT imaging, PVCs, HLD, mechanical falls, and rheumatoid arthritis who presents for follow-up of HFpEF, PAF, and labile hypertension.  Zio patch in 02/2019 showed a predominant rhythm of sinus with an average rate of 62 bpm (range 41 to 107 bpm in sinus), 39 episodes of paroxysmal SVT lasting up to 10 beats with a maximum rate of 184 bpm, rare PACs and PVCs, brief episodes of junctional rhythm occurred often in the setting of PACs and PVCs, patient triggered events corresponded to sinus rhythm, PACs, PVCs, and transient junctional rhythm.  Echo in 03/2019 showed an EF of 55 to 60%, no regional wall motion abnormalities, normal LV diastolic function parameters, normal RV systolic function and ventricular cavity size, trivial mitral regurgitation, moderate aortic stenosis, and an estimated right atrial pressure of 3 mmHg.  Echo in 05/2020 showed an EF of 50 to 55%, mild LVH, indeterminate LV diastolic function parameters, normal RV systolic function and ventricular cavity size, mild mitral regurgitation, moderate aortic stenosis, and an estimated right atrial pressure of 8 mmHg.  Most recent echo from 01/2021 demonstrated an EF of 60 to 65%, no regional wall motion abnormalities, mild LVH, grade 1 diastolic dysfunction, normal RV systolic function and ventricular cavity size, mild mitral regurgitation, and mild to moderate aortic valve stenosis with a mean gradient of 15 mmHg and a valve area of 1.58 cm.  She was seen in the ED in 07/2021 with episode of LOC occurred while eating breakfast associated with  palpitations.  BP was soft at 60/36 with EMS initially noting A-fib with controlled ventricular response.  Subsequent Zio patch from 07/2021 showed a predominant rhythm of sinus with an average rate of 66 bpm (range 50 to 100 bpm in sinus), PAF occurred with an overall burden of 24% with an average ventricular rate of 110 bpm with the longest episode lasting 15 hours and 32 minutes, posttermination pauses lasting up to 3.3 seconds occurred, 128 episode of SVT with the longest lasting 22.9 seconds, and rare PACs and PVCs.  Patient triggered events corresponded to A-fib and PVCs.  Given these findings, she was evaluated by EP in 09/2021 with post termination pauses not felt to be underlying etiology of syncopal episode.  Episode was felt to be primarily vagal mediated.  It was recommended the patient get a Fitbit and carvedilol was titrated to 6.25 mg twice daily.  She was last seen by general cardiology in 12/2021 noting continued labile blood pressures with readings ranging from 91/54 to 206/107.  Furosemide was decreased to 20 mg daily.  She was maintaining sinus rhythm.  She followed up with the EP in 01/2022 without any interval events, though with continued orthostatic lightheadedness with recommendation to allow for a more permissive blood pressure to 180 systolic.  She comes company by her daughter today and is doing very well from a cardiac perspective.  No symptoms of angina or cardiac decompensation.  No dyspnea, palpitations, dizziness, presyncope, or syncope.  Lower extremity swelling is improved.  She is wearing compression stockings.  She is currently taking furosemide 40 mg  alternating with 20 mg in the mornings along with 20 mg in the afternoons.  She does take a as needed hydralazine in the afternoons occasionally, particularly if she is going out with her family as she tends to note that this increases her blood pressure.  No falls or symptoms concerning for bleeding.  Overall, patient and daughter  feel like the patient is doing very well from a heart perspective and they do not have any active cardiac concerns at this time.   Labs independently reviewed: 07/2021 - Hgb 12.8, PLT 325, potassium 3.6, BUN 19, serum creatinine 0.76, albumin 3.3, AST/ALT normal 05/2020 - magnesium 2.0, TSH normal, TC 199, TG 88, HDL 59, LDL 122, A1c 5.5  Past Medical History:  Diagnosis Date   Aortic valve disease    Mild-moderate aortic stenosis   Asthma    Cancer    basal cell    Gout    Hyperlipidemia    Hypertension    Hypertensive heart disease without heart failure    Hypothyroidism    PAF (paroxysmal atrial fibrillation)    PVC (premature ventricular contraction)    Rheumatoid arthritis    Ventricular premature depolarization     Past Surgical History:  Procedure Laterality Date   BLADDER SURGERY  10/19/2010   BONY PELVIS SURGERY  1990   BREAST BIOPSY Left 1986   CARDIAC SURGERY  2009   PVC   CATARACT EXTRACTION Left 11/15/00   CATARACT EXTRACTION Right    ELBOW SURGERY  06/1990   HIP SURGERY Left 05/28/09   HIP SURGERY Right 06/30/03   HYSTEROSCOPY  06/17/1986   vaginal    Current Medications: Current Meds  Medication Sig   acetaminophen (TYLENOL) 650 MG CR tablet Take 650 mg by mouth every 8 (eight) hours as needed for pain.   Albuterol Sulfate (PROAIR RESPICLICK) 108 (90 Base) MCG/ACT AEPB Inhale 2 puffs into the lungs every 6 (six) hours as needed.   amoxicillin (AMOXIL) 500 MG tablet Take 4 tablets (2,000 mg total) by mouth 2 (two) times daily. For dental procedures only   apixaban (ELIQUIS) 5 MG TABS tablet Take 5 mg by mouth 2 (two) times daily.    b complex vitamins tablet Take 1 tablet by mouth daily.   Bioflavonoid Products (BIOFLEX) TABS Take 2 tablets by mouth daily.   carvedilol (COREG) 6.25 MG tablet Take 1 tablet (6.25 mg total) by mouth 2 (two) times daily.   Cholecalciferol (VITAMIN D3) 1000 UNITS CAPS Take 1 capsule by mouth daily.   Coenzyme Q10 (COQ10) 100 MG  CAPS Take 1 capsule by mouth daily.   CRANBERRY PO Take 1 tablet by mouth daily.   famotidine (PEPCID) 20 MG tablet Take 20 mg by mouth daily.   fexofenadine (ALLEGRA) 180 MG tablet Take 180 mg by mouth as needed.   Fluticasone-Umeclidin-Vilant (TRELEGY ELLIPTA) 100-62.5-25 MCG/ACT AEPB Inhale 1 puff into the lungs daily.   furosemide (LASIX) 40 MG tablet Take Furosemide 40 mg one tablet every am & one-half (20 mg) tablet at noon.   gabapentin (NEURONTIN) 600 MG tablet Take 600 mg by mouth 2 (two) times daily.   Ginger, Zingiber officinalis, (GINGER ROOT PO) Take by mouth daily.   hydrALAZINE (APRESOLINE) 25 MG tablet TAKE 1 TABLET (25 MG)  BY MOUTH UP TO THREE TIMES A DAY AS NEEDED FOR SYSTOLIC BP OF 170 OR GREATER   levothyroxine (SYNTHROID, LEVOTHROID) 50 MCG tablet Take 1 tablet by mouth daily.   losartan (COZAAR) 50 MG tablet Take  1 tablet (50 mg total) by mouth 2 (two) times daily.   Multiple Vitamins-Minerals (CENTRUM SILVER PO) Take 1 tablet by mouth daily.   Multiple Vitamins-Minerals (PRESERVISION AREDS 2 PO) Take 1 capsule by mouth 2 (two) times daily.    Omega-3 Fatty Acids (FISH OIL) 1000 MG CAPS Take 2 capsules by mouth daily.   PREMARIN vaginal cream Place 1 Applicatorful vaginally in the morning and at bedtime.    psyllium (METAMUCIL) 58.6 % powder Take 1 packet by mouth daily.   TURMERIC PO Take by mouth daily.    Allergies:   Metronidazole, Sulindac, and Naproxen   Social History   Socioeconomic History   Marital status: Widowed    Spouse name: Not on file   Number of children: 7   Years of education: 12th   Highest education level: Not on file  Occupational History   Occupation: Retired  Tobacco Use   Smoking status: Never   Smokeless tobacco: Never  Vaping Use   Vaping Use: Never used  Substance and Sexual Activity   Alcohol use: No   Drug use: No   Sexual activity: Not on file  Other Topics Concern   Not on file  Social History Narrative   Patient lives  at home alone.Caffeine Use: 1 cup daily 7 children (1 deceased)   Social Determinants of Health   Financial Resource Strain: Not on file  Food Insecurity: Not on file  Transportation Needs: Not on file  Physical Activity: Not on file  Stress: Not on file  Social Connections: Not on file     Family History:  The patient's family history includes Breast cancer in her daughter; Heart attack in her father and maternal grandmother; Heart failure in her sister; Melanoma in her mother and sister; Stroke in her mother; Uterine cancer in her sister.  ROS:   12-point review of systems is negative unless otherwise noted in the HPI.   EKGs/Labs/Other Studies Reviewed:    Studies reviewed were summarized above. The additional studies were reviewed today:  Zio patch 07/2021:   The patient was monitored for 14 days.   The predominant rhythm was sinus with an average rate of 66 bpm in sinus (range 50-100 bpm in sinus).   Paroxysmal atrial fibrillation occurred with an average ventricular rate of 110 bpm (range 59-188 bpm).  The longest episode lasted 15 hours, 32 minutes.  Atrial fibrillation burden was 24%.   Post-termination pauses lasting up to 3.3 seconds occurred.   There were rare PAC's and PVC's.  128 supraventricular runs occurred, lasting up to 22.9 seconds with a maximum rate of 194 bpm.   Patient triggered events correspond to atrial fibrillation and PVC's.   Sinus rhythm with paroxysmal atrial fibrillation and post-termination pauses of up to 3.3 seconds, as detailed above.  PSVT also noted. __________  2D echo 02/01/2021: 1. Left ventricular ejection fraction, by estimation, is 60 to 65%. Left  ventricular ejection fraction by 2D MOD biplane is 61.2 %. The left  ventricle has normal function. The left ventricle has no regional wall  motion abnormalities. There is mild left  ventricular hypertrophy. Left ventricular diastolic parameters are  consistent with Grade I diastolic  dysfunction (impaired relaxation).   2. Right ventricular systolic function is normal. The right ventricular  size is normal.   3. The mitral valve is normal in structure. Mild mitral valve  regurgitation.   4. Mean aortic valve gradient 15 mmHg, peak gradient 22 mmHg, Vmax  1.36m/s, AVA 1.58cm2.Marland Kitchen  The aortic valve is calcified. Aortic valve  regurgitation is not visualized. Mild to moderate aortic valve stenosis.   5. The inferior vena cava is normal in size with greater than 50%  respiratory variability, suggesting right atrial pressure of 3 mmHg.   Comparison(s): EF 55-60%; Moderate AS. __________   2D echo 05/08/2020: 1. Left ventricular ejection fraction, by estimation, is 50 to 55%. The  left ventricle has low normal function. Left ventricular endocardial  border not optimally defined to evaluate regional wall motion. There is  mild left ventricular hypertrophy. Left  ventricular diastolic parameters are indeterminate.   2. Right ventricular systolic function is normal. The right ventricular  size is normal.   3. The mitral valve is normal in structure. Mild mitral valve  regurgitation. No evidence of mitral stenosis.   4. The aortic valve was not well visualized. Aortic valve regurgitation  is not visualized. Moderate aortic valve stenosis. Aortic gradient does  not seem accurate. Aortic stenosis seems at least moderate based on valve  morphology and restricted opening.   5. The inferior vena cava is normal in size with <50% respiratory  variability, suggesting right atrial pressure of 8 mmHg. __________   2D echo 04/03/2019: 1. Left ventricular ejection fraction, by estimation, is 55 to 60%. The  left ventricle has normal function. The left ventricle has no regional  wall motion abnormalities. Left ventricular diastolic parameters were  normal.   2. Right ventricular systolic function is normal. The right ventricular  size is normal.   3. The mitral valve is grossly normal.  Trivial mitral valve  regurgitation.   4. The aortic valve was not well visualized. Aortic valve regurgitation  is not visualized. Moderate aortic valve stenosis.   5. The inferior vena cava is normal in size with greater than 50%  respiratory variability, suggesting right atrial pressure of 3 mmHg. __________   Luci Bank patch 02/2019: The patient was monitored for 13 days, 23 hours. The predominant rhythm was sinus with an average rate of 62 bpm (range 41 to 107 bpm in sinus). Rare PACs and PVCs occurred. There were 39 episodes of paroxysmal supraventricular tachycardia lasting up to 10 beats with a maximum rate of 184 bpm. Brief episodes of junctional rhythm occurred, often in the setting of PACs and PVCs. Patient triggered events correspond to sinus rhythm, PACs, and PVCs transient junctional rhythm.   Predominately sinus rhythm with rare PACs and PVCs.  39 episodes of brief PSVT occurred, as well as transient junctional rhythm (often in the setting of PACs and PVCs).   EKG:  EKG is ordered today.  The EKG ordered today demonstrates sinus bradycardia, 58 bpm, nonspecific ST-T changes consistent with prior tracings  Recent Labs: 07/04/2021: ALT 18; BUN 19; Creatinine, Ser 0.76; Hemoglobin 12.8; Platelets 325; Potassium 3.6; Sodium 137  Recent Lipid Panel    Component Value Date/Time   CHOL 199 05/08/2020 0522   TRIG 88 05/08/2020 0522   HDL 59 05/08/2020 0522   CHOLHDL 3.4 05/08/2020 0522   VLDL 18 05/08/2020 0522   LDLCALC 122 (H) 05/08/2020 0522    PHYSICAL EXAM:    VS:  BP 134/69 (BP Location: Left Arm, Patient Position: Sitting, Cuff Size: Normal)   Pulse (!) 58   Ht 5\' 3"  (1.6 m)   Wt 222 lb 12.8 oz (101.1 kg)   SpO2 92%   BMI 39.47 kg/m   BMI: Body mass index is 39.47 kg/m.  Physical Exam Vitals reviewed.  Constitutional:  Appearance: She is well-developed.  HENT:     Head: Normocephalic and atraumatic.  Eyes:     General:        Right eye: No discharge.         Left eye: No discharge.  Neck:     Vascular: No JVD.  Cardiovascular:     Rate and Rhythm: Normal rate and regular rhythm.     Heart sounds: S1 normal and S2 normal. Heart sounds not distant. No midsystolic click and no opening snap. Murmur heard.     Systolic murmur is present with a grade of 2/6 at the upper right sternal border.     No friction rub.  Pulmonary:     Effort: Pulmonary effort is normal. No respiratory distress.     Breath sounds: Normal breath sounds. No decreased breath sounds, wheezing or rales.  Chest:     Chest wall: No tenderness.  Abdominal:     General: There is no distension.  Musculoskeletal:     Cervical back: Normal range of motion.     Right lower leg: No edema.     Left lower leg: No edema.     Comments: Compression socks in place.  Skin:    General: Skin is warm and dry.     Nails: There is no clubbing.  Neurological:     Mental Status: She is alert and oriented to person, place, and time.  Psychiatric:        Speech: Speech normal.        Behavior: Behavior normal.        Thought Content: Thought content normal.        Judgment: Judgment normal.     Wt Readings from Last 3 Encounters:  04/26/22 222 lb 12.8 oz (101.1 kg)  02/22/22 220 lb 6.4 oz (100 kg)  01/27/22 219 lb 2 oz (99.4 kg)     ASSESSMENT & PLAN:   Labile hypertension: Blood pressure is well-controlled in the office today.  Continue current regimen including losartan 50 mg twice daily, carvedilol 6.25 mg twice daily, and as needed hydralazine 25 mg 3 times daily for systolic blood pressure of 170 mmHg or greater.  HFpEF: Euvolemic and well compensated on current dose of furosemide which includes 40 mg alternating with 20 mg in the mornings every other day along with 20 mg in the p.m.  Aortic stenosis: Mild to moderate by echo in 01/2021.  Update echo.  No symptoms of dizziness, near-syncope, syncope, dyspnea, or chest pain.  PAF: Maintaining sinus rhythm with carvedilol 6.25 mg  twice daily.  CHA2DS2-VASc at least 7 (CHF, HTN, age x 2, CVA x 2, sex category).  She remains on apixaban 5 mg twice daily and does not meet reduced dosing criteria.  Syncope: No further episodes.  Felt to be consistent with vasovagal mechanism, though post termination pauses of up to 3.3 seconds were noted on prior event monitor.  Follow-up with Dr. Graciela Husbands as directed.   Disposition: F/u with Dr. Okey Dupre or an APP in 6 months.   Medication Adjustments/Labs and Tests Ordered: Current medicines are reviewed at length with the patient today.  Concerns regarding medicines are outlined above. Medication changes, Labs and Tests ordered today are summarized above and listed in the Patient Instructions accessible in Encounters.   Signed, Eula Listen, PA-C 04/26/2022 12:39 PM     [facility] - [place] [address] [place], [place] [address] [phone]

## 2022-04-25 ENCOUNTER — Other Ambulatory Visit: Payer: Self-pay

## 2022-04-25 MED ORDER — TRELEGY ELLIPTA 100-62.5-25 MCG/ACT IN AEPB: 1.0000 | INHALATION_SPRAY | Freq: Every day | RESPIRATORY_TRACT | 0 refills | Status: DC

## 2022-04-26 ENCOUNTER — Encounter: Payer: Self-pay | Admitting: Physician Assistant

## 2022-04-26 ENCOUNTER — Ambulatory Visit: Payer: PPO | Attending: Physician Assistant | Admitting: Physician Assistant

## 2022-04-26 VITALS — BP 134/69 | HR 58 | Ht 63.0 in | Wt 222.8 lb

## 2022-04-26 DIAGNOSIS — R42 Dizziness and giddiness: Secondary | ICD-10-CM | POA: Diagnosis not present

## 2022-04-26 DIAGNOSIS — I350 Nonrheumatic aortic (valve) stenosis: Secondary | ICD-10-CM | POA: Diagnosis not present

## 2022-04-26 DIAGNOSIS — I5032 Chronic diastolic (congestive) heart failure: Secondary | ICD-10-CM | POA: Diagnosis not present

## 2022-04-26 DIAGNOSIS — Z7951 Long term (current) use of inhaled steroids: Secondary | ICD-10-CM | POA: Diagnosis not present

## 2022-04-26 DIAGNOSIS — R55 Syncope and collapse: Secondary | ICD-10-CM

## 2022-04-26 DIAGNOSIS — M810 Age-related osteoporosis without current pathological fracture: Secondary | ICD-10-CM | POA: Diagnosis not present

## 2022-04-26 DIAGNOSIS — M109 Gout, unspecified: Secondary | ICD-10-CM | POA: Diagnosis not present

## 2022-04-26 DIAGNOSIS — K219 Gastro-esophageal reflux disease without esophagitis: Secondary | ICD-10-CM | POA: Diagnosis not present

## 2022-04-26 DIAGNOSIS — M069 Rheumatoid arthritis, unspecified: Secondary | ICD-10-CM | POA: Diagnosis not present

## 2022-04-26 DIAGNOSIS — I480 Paroxysmal atrial fibrillation: Secondary | ICD-10-CM | POA: Diagnosis not present

## 2022-04-26 DIAGNOSIS — I471 Supraventricular tachycardia, unspecified: Secondary | ICD-10-CM | POA: Diagnosis not present

## 2022-04-26 DIAGNOSIS — I130 Hypertensive heart and chronic kidney disease with heart failure and stage 1 through stage 4 chronic kidney disease, or unspecified chronic kidney disease: Secondary | ICD-10-CM | POA: Diagnosis not present

## 2022-04-26 DIAGNOSIS — Z87440 Personal history of urinary (tract) infections: Secondary | ICD-10-CM | POA: Diagnosis not present

## 2022-04-26 DIAGNOSIS — N182 Chronic kidney disease, stage 2 (mild): Secondary | ICD-10-CM | POA: Diagnosis not present

## 2022-04-26 DIAGNOSIS — G319 Degenerative disease of nervous system, unspecified: Secondary | ICD-10-CM | POA: Diagnosis not present

## 2022-04-26 DIAGNOSIS — E7800 Pure hypercholesterolemia, unspecified: Secondary | ICD-10-CM | POA: Diagnosis not present

## 2022-04-26 DIAGNOSIS — R0989 Other specified symptoms and signs involving the circulatory and respiratory systems: Secondary | ICD-10-CM | POA: Diagnosis not present

## 2022-04-26 DIAGNOSIS — E1140 Type 2 diabetes mellitus with diabetic neuropathy, unspecified: Secondary | ICD-10-CM | POA: Diagnosis not present

## 2022-04-26 DIAGNOSIS — Z7901 Long term (current) use of anticoagulants: Secondary | ICD-10-CM | POA: Diagnosis not present

## 2022-04-26 DIAGNOSIS — R32 Unspecified urinary incontinence: Secondary | ICD-10-CM | POA: Diagnosis not present

## 2022-04-26 DIAGNOSIS — I7121 Aneurysm of the ascending aorta, without rupture: Secondary | ICD-10-CM | POA: Diagnosis not present

## 2022-04-26 DIAGNOSIS — M170 Bilateral primary osteoarthritis of knee: Secondary | ICD-10-CM | POA: Diagnosis not present

## 2022-04-26 DIAGNOSIS — M169 Osteoarthritis of hip, unspecified: Secondary | ICD-10-CM | POA: Diagnosis not present

## 2022-04-26 DIAGNOSIS — E039 Hypothyroidism, unspecified: Secondary | ICD-10-CM | POA: Diagnosis not present

## 2022-04-26 NOTE — Patient Instructions (Signed)
Medication Instructions:  No changes at this time.   *If you need a refill on your cardiac medications before your next appointment, please call your pharmacy*   Lab Work: None  If you have labs (blood work) drawn today and your tests are completely normal, you will receive your results only by: MyChart Message (if you have MyChart) OR A paper copy in the mail If you have any lab test that is abnormal or we need to change your treatment, we will call you to review the results.   Testing/Procedures: Your physician has requested that you have an echocardiogram. Echocardiography is a painless test that uses sound waves to create images of your heart. It provides your doctor with information about the size and shape of your heart and how well your heart's chambers and valves are working. This procedure takes approximately one hour. There are no restrictions for this procedure. Please do NOT wear cologne, perfume, aftershave, or lotions (deodorant is allowed). Please arrive 15 minutes prior to your appointment time.    Follow-Up: At [facility], you and your health needs are our priority.  As part of our continuing mission to provide you with exceptional heart care, we have created designated Provider Care Teams.  These Care Teams include your primary Cardiologist (physician) and Advanced Practice Providers (APPs -  Physician Assistants and Nurse Practitioners) who all work together to provide you with the care you need, when you need it.   Your next appointment:   6 month(s)  Provider:   Christopher End, MD or Ryan Dunn, PA-C     

## 2022-04-27 ENCOUNTER — Other Ambulatory Visit: Payer: Self-pay

## 2022-04-27 DIAGNOSIS — I7091 Generalized atherosclerosis: Secondary | ICD-10-CM | POA: Diagnosis not present

## 2022-04-27 MED ORDER — LOSARTAN POTASSIUM 50 MG PO TABS: 50.0000 mg | ORAL_TABLET | Freq: Two times a day (BID) | ORAL | 1 refills | Status: DC

## 2022-05-03 DIAGNOSIS — K219 Gastro-esophageal reflux disease without esophagitis: Secondary | ICD-10-CM | POA: Diagnosis not present

## 2022-05-03 DIAGNOSIS — I10 Essential (primary) hypertension: Secondary | ICD-10-CM | POA: Diagnosis not present

## 2022-05-03 DIAGNOSIS — I130 Hypertensive heart and chronic kidney disease with heart failure and stage 1 through stage 4 chronic kidney disease, or unspecified chronic kidney disease: Secondary | ICD-10-CM | POA: Diagnosis not present

## 2022-05-03 DIAGNOSIS — Z87440 Personal history of urinary (tract) infections: Secondary | ICD-10-CM | POA: Diagnosis not present

## 2022-05-03 DIAGNOSIS — G319 Degenerative disease of nervous system, unspecified: Secondary | ICD-10-CM | POA: Diagnosis not present

## 2022-05-03 DIAGNOSIS — M810 Age-related osteoporosis without current pathological fracture: Secondary | ICD-10-CM | POA: Diagnosis not present

## 2022-05-03 DIAGNOSIS — R32 Unspecified urinary incontinence: Secondary | ICD-10-CM | POA: Diagnosis not present

## 2022-05-03 DIAGNOSIS — M069 Rheumatoid arthritis, unspecified: Secondary | ICD-10-CM | POA: Diagnosis not present

## 2022-05-03 DIAGNOSIS — E1140 Type 2 diabetes mellitus with diabetic neuropathy, unspecified: Secondary | ICD-10-CM | POA: Diagnosis not present

## 2022-05-03 DIAGNOSIS — M169 Osteoarthritis of hip, unspecified: Secondary | ICD-10-CM | POA: Diagnosis not present

## 2022-05-03 DIAGNOSIS — I480 Paroxysmal atrial fibrillation: Secondary | ICD-10-CM | POA: Diagnosis not present

## 2022-05-03 DIAGNOSIS — E039 Hypothyroidism, unspecified: Secondary | ICD-10-CM | POA: Diagnosis not present

## 2022-05-03 DIAGNOSIS — I5032 Chronic diastolic (congestive) heart failure: Secondary | ICD-10-CM | POA: Diagnosis not present

## 2022-05-03 DIAGNOSIS — I7121 Aneurysm of the ascending aorta, without rupture: Secondary | ICD-10-CM | POA: Diagnosis not present

## 2022-05-03 DIAGNOSIS — M109 Gout, unspecified: Secondary | ICD-10-CM | POA: Diagnosis not present

## 2022-05-03 DIAGNOSIS — I471 Supraventricular tachycardia, unspecified: Secondary | ICD-10-CM | POA: Diagnosis not present

## 2022-05-03 DIAGNOSIS — I350 Nonrheumatic aortic (valve) stenosis: Secondary | ICD-10-CM | POA: Diagnosis not present

## 2022-05-03 DIAGNOSIS — R42 Dizziness and giddiness: Secondary | ICD-10-CM | POA: Diagnosis not present

## 2022-05-03 DIAGNOSIS — Z7951 Long term (current) use of inhaled steroids: Secondary | ICD-10-CM | POA: Diagnosis not present

## 2022-05-03 DIAGNOSIS — M170 Bilateral primary osteoarthritis of knee: Secondary | ICD-10-CM | POA: Diagnosis not present

## 2022-05-03 DIAGNOSIS — Z7901 Long term (current) use of anticoagulants: Secondary | ICD-10-CM | POA: Diagnosis not present

## 2022-05-03 DIAGNOSIS — E7800 Pure hypercholesterolemia, unspecified: Secondary | ICD-10-CM | POA: Diagnosis not present

## 2022-05-03 DIAGNOSIS — N182 Chronic kidney disease, stage 2 (mild): Secondary | ICD-10-CM | POA: Diagnosis not present

## 2022-05-04 ENCOUNTER — Encounter: Payer: Self-pay | Admitting: Pulmonary Disease

## 2022-05-09 DIAGNOSIS — Z7951 Long term (current) use of inhaled steroids: Secondary | ICD-10-CM | POA: Diagnosis not present

## 2022-05-09 DIAGNOSIS — I5032 Chronic diastolic (congestive) heart failure: Secondary | ICD-10-CM | POA: Diagnosis not present

## 2022-05-09 DIAGNOSIS — N182 Chronic kidney disease, stage 2 (mild): Secondary | ICD-10-CM | POA: Diagnosis not present

## 2022-05-09 DIAGNOSIS — M169 Osteoarthritis of hip, unspecified: Secondary | ICD-10-CM | POA: Diagnosis not present

## 2022-05-09 DIAGNOSIS — Z7901 Long term (current) use of anticoagulants: Secondary | ICD-10-CM | POA: Diagnosis not present

## 2022-05-09 DIAGNOSIS — Z87440 Personal history of urinary (tract) infections: Secondary | ICD-10-CM | POA: Diagnosis not present

## 2022-05-09 DIAGNOSIS — K219 Gastro-esophageal reflux disease without esophagitis: Secondary | ICD-10-CM | POA: Diagnosis not present

## 2022-05-09 DIAGNOSIS — M109 Gout, unspecified: Secondary | ICD-10-CM | POA: Diagnosis not present

## 2022-05-09 DIAGNOSIS — E7800 Pure hypercholesterolemia, unspecified: Secondary | ICD-10-CM | POA: Diagnosis not present

## 2022-05-09 DIAGNOSIS — G319 Degenerative disease of nervous system, unspecified: Secondary | ICD-10-CM | POA: Diagnosis not present

## 2022-05-09 DIAGNOSIS — I471 Supraventricular tachycardia, unspecified: Secondary | ICD-10-CM | POA: Diagnosis not present

## 2022-05-09 DIAGNOSIS — R42 Dizziness and giddiness: Secondary | ICD-10-CM | POA: Diagnosis not present

## 2022-05-09 DIAGNOSIS — I480 Paroxysmal atrial fibrillation: Secondary | ICD-10-CM | POA: Diagnosis not present

## 2022-05-09 DIAGNOSIS — M069 Rheumatoid arthritis, unspecified: Secondary | ICD-10-CM | POA: Diagnosis not present

## 2022-05-09 DIAGNOSIS — R32 Unspecified urinary incontinence: Secondary | ICD-10-CM | POA: Diagnosis not present

## 2022-05-09 DIAGNOSIS — I350 Nonrheumatic aortic (valve) stenosis: Secondary | ICD-10-CM | POA: Diagnosis not present

## 2022-05-09 DIAGNOSIS — M810 Age-related osteoporosis without current pathological fracture: Secondary | ICD-10-CM | POA: Diagnosis not present

## 2022-05-09 DIAGNOSIS — E039 Hypothyroidism, unspecified: Secondary | ICD-10-CM | POA: Diagnosis not present

## 2022-05-09 DIAGNOSIS — I130 Hypertensive heart and chronic kidney disease with heart failure and stage 1 through stage 4 chronic kidney disease, or unspecified chronic kidney disease: Secondary | ICD-10-CM | POA: Diagnosis not present

## 2022-05-09 DIAGNOSIS — I7121 Aneurysm of the ascending aorta, without rupture: Secondary | ICD-10-CM | POA: Diagnosis not present

## 2022-05-09 DIAGNOSIS — E1140 Type 2 diabetes mellitus with diabetic neuropathy, unspecified: Secondary | ICD-10-CM | POA: Diagnosis not present

## 2022-05-09 DIAGNOSIS — M170 Bilateral primary osteoarthritis of knee: Secondary | ICD-10-CM | POA: Diagnosis not present

## 2022-05-17 DIAGNOSIS — G319 Degenerative disease of nervous system, unspecified: Secondary | ICD-10-CM | POA: Diagnosis not present

## 2022-05-17 DIAGNOSIS — R42 Dizziness and giddiness: Secondary | ICD-10-CM | POA: Diagnosis not present

## 2022-05-17 DIAGNOSIS — K219 Gastro-esophageal reflux disease without esophagitis: Secondary | ICD-10-CM | POA: Diagnosis not present

## 2022-05-17 DIAGNOSIS — M810 Age-related osteoporosis without current pathological fracture: Secondary | ICD-10-CM | POA: Diagnosis not present

## 2022-05-17 DIAGNOSIS — Z87440 Personal history of urinary (tract) infections: Secondary | ICD-10-CM | POA: Diagnosis not present

## 2022-05-17 DIAGNOSIS — M169 Osteoarthritis of hip, unspecified: Secondary | ICD-10-CM | POA: Diagnosis not present

## 2022-05-17 DIAGNOSIS — Z7951 Long term (current) use of inhaled steroids: Secondary | ICD-10-CM | POA: Diagnosis not present

## 2022-05-17 DIAGNOSIS — N182 Chronic kidney disease, stage 2 (mild): Secondary | ICD-10-CM | POA: Diagnosis not present

## 2022-05-17 DIAGNOSIS — M170 Bilateral primary osteoarthritis of knee: Secondary | ICD-10-CM | POA: Diagnosis not present

## 2022-05-17 DIAGNOSIS — E039 Hypothyroidism, unspecified: Secondary | ICD-10-CM | POA: Diagnosis not present

## 2022-05-17 DIAGNOSIS — Z7901 Long term (current) use of anticoagulants: Secondary | ICD-10-CM | POA: Diagnosis not present

## 2022-05-17 DIAGNOSIS — I7121 Aneurysm of the ascending aorta, without rupture: Secondary | ICD-10-CM | POA: Diagnosis not present

## 2022-05-17 DIAGNOSIS — R32 Unspecified urinary incontinence: Secondary | ICD-10-CM | POA: Diagnosis not present

## 2022-05-17 DIAGNOSIS — I350 Nonrheumatic aortic (valve) stenosis: Secondary | ICD-10-CM | POA: Diagnosis not present

## 2022-05-17 DIAGNOSIS — I130 Hypertensive heart and chronic kidney disease with heart failure and stage 1 through stage 4 chronic kidney disease, or unspecified chronic kidney disease: Secondary | ICD-10-CM | POA: Diagnosis not present

## 2022-05-17 DIAGNOSIS — I5032 Chronic diastolic (congestive) heart failure: Secondary | ICD-10-CM | POA: Diagnosis not present

## 2022-05-17 DIAGNOSIS — I471 Supraventricular tachycardia, unspecified: Secondary | ICD-10-CM | POA: Diagnosis not present

## 2022-05-17 DIAGNOSIS — I480 Paroxysmal atrial fibrillation: Secondary | ICD-10-CM | POA: Diagnosis not present

## 2022-05-17 DIAGNOSIS — M069 Rheumatoid arthritis, unspecified: Secondary | ICD-10-CM | POA: Diagnosis not present

## 2022-05-17 DIAGNOSIS — M109 Gout, unspecified: Secondary | ICD-10-CM | POA: Diagnosis not present

## 2022-05-17 DIAGNOSIS — E7800 Pure hypercholesterolemia, unspecified: Secondary | ICD-10-CM | POA: Diagnosis not present

## 2022-05-17 DIAGNOSIS — E1140 Type 2 diabetes mellitus with diabetic neuropathy, unspecified: Secondary | ICD-10-CM | POA: Diagnosis not present

## 2022-05-25 ENCOUNTER — Ambulatory Visit: Payer: PPO | Attending: Physician Assistant

## 2022-05-25 DIAGNOSIS — I350 Nonrheumatic aortic (valve) stenosis: Secondary | ICD-10-CM

## 2022-05-25 LAB — ECHOCARDIOGRAM COMPLETE
AR max vel: 1.13 cm2
AV Area VTI: 1.2 cm2
AV Area mean vel: 1.13 cm2
AV Mean grad: 13.3 mmHg
AV Peak grad: 24.8 mmHg
Ao pk vel: 2.49 m/s
Area-P 1/2: 2.83 cm2
Calc EF: 60.4 %
S' Lateral: 3.6 cm
Single Plane A2C EF: 57.6 %
Single Plane A4C EF: 63.9 %

## 2022-06-03 DIAGNOSIS — G510 Bell's palsy: Secondary | ICD-10-CM | POA: Diagnosis not present

## 2022-06-03 DIAGNOSIS — I10 Essential (primary) hypertension: Secondary | ICD-10-CM | POA: Diagnosis not present

## 2022-06-03 DIAGNOSIS — I5032 Chronic diastolic (congestive) heart failure: Secondary | ICD-10-CM | POA: Diagnosis not present

## 2022-06-03 DIAGNOSIS — H02831 Dermatochalasis of right upper eyelid: Secondary | ICD-10-CM | POA: Diagnosis not present

## 2022-06-03 DIAGNOSIS — E039 Hypothyroidism, unspecified: Secondary | ICD-10-CM | POA: Diagnosis not present

## 2022-06-03 DIAGNOSIS — H02834 Dermatochalasis of left upper eyelid: Secondary | ICD-10-CM | POA: Diagnosis not present

## 2022-06-10 DIAGNOSIS — G4733 Obstructive sleep apnea (adult) (pediatric): Secondary | ICD-10-CM | POA: Diagnosis not present

## 2022-06-10 DIAGNOSIS — D692 Other nonthrombocytopenic purpura: Secondary | ICD-10-CM | POA: Diagnosis not present

## 2022-06-10 DIAGNOSIS — Z515 Encounter for palliative care: Secondary | ICD-10-CM | POA: Diagnosis not present

## 2022-06-10 DIAGNOSIS — I480 Paroxysmal atrial fibrillation: Secondary | ICD-10-CM | POA: Diagnosis not present

## 2022-06-10 DIAGNOSIS — I10 Essential (primary) hypertension: Secondary | ICD-10-CM | POA: Diagnosis not present

## 2022-06-10 DIAGNOSIS — Z6841 Body Mass Index (BMI) 40.0 and over, adult: Secondary | ICD-10-CM | POA: Diagnosis not present

## 2022-06-23 ENCOUNTER — Encounter: Payer: Self-pay | Admitting: Pulmonary Disease

## 2022-06-23 ENCOUNTER — Ambulatory Visit (INDEPENDENT_AMBULATORY_CARE_PROVIDER_SITE_OTHER): Payer: PPO | Admitting: Pulmonary Disease

## 2022-06-23 VITALS — BP 120/78 | HR 68 | Temp 97.8°F | Ht 63.0 in | Wt 221.4 lb

## 2022-06-23 DIAGNOSIS — J4540 Moderate persistent asthma, uncomplicated: Secondary | ICD-10-CM

## 2022-06-23 DIAGNOSIS — G4733 Obstructive sleep apnea (adult) (pediatric): Secondary | ICD-10-CM | POA: Diagnosis not present

## 2022-06-23 DIAGNOSIS — R053 Chronic cough: Secondary | ICD-10-CM | POA: Diagnosis not present

## 2022-06-23 MED ORDER — TRELEGY ELLIPTA 100-62.5-25 MCG/ACT IN AEPB: 1.0000 | INHALATION_SPRAY | Freq: Every day | RESPIRATORY_TRACT | 0 refills | Status: DC

## 2022-06-23 NOTE — Patient Instructions (Signed)
Your compliance with the CPAP was excellent  Continue your Trelegy as you are doing.  Make sure you rinse your mouth well after you use it.  We provided you some samples cytology today.  We will see him in follow-up in 4 to 6 months time call sooner should any problems arise.

## 2022-06-23 NOTE — Progress Notes (Signed)
Subjective:    Patient ID: Jessica Kaufman, female    DOB: Oct 31, 1925, 89 y.o.   MRN: [id number]  Patient Care Team: Ailene Ravel, MD as PCP - General (Family Medicine) End, Cristal Deer, MD as PCP - Cardiology (Cardiology) Salena Saner, MD as Consulting Physician (Pulmonary Disease)  Chief Complaint  Patient presents with   Follow-up    SOB when she walks a lot. No wheezing. Dry cough. CPAP is good, no problems.    HPI [name] is a 89 year old lifelong never smoker who presents for follow-up on the issue of asthma and asthmatic bronchitis. She was last seen here on 22 February 2022.  At that time she was doing well on Trelegy Ellipta and on her CPAP for obstructive sleep apnea.  She has had rare dry cough in the morning usually triggered by postnasal drip.  Usually clears after moving about and getting active during the day.  This is not long-lasting.  She has not had any fevers, chills or sweats.  No tachypalpitations since her last visit.  She has not had any orthopnea.  Previously she had been having issues with lower extremity edema but this has improved with furosemide twice daily.  She states that she feels she is doing well.  She has not had any shortness of breath unless she "walks a lot" which is a rare occurrence. She does not endorse any other symptomatology today.     She has had obstructive sleep apnea diagnosed several years ago.  She is on CPAP 4 to 20 cm H2O.  Appliance download shows 100% usage in the last 30 days with 30 days over 4 hours of use for 100% compliance.  Residual AHI 6.0.  Median pressure between 5.3 and 7.8 cm H2O.  She does well with the device and states that she cannot sleep without it.  Notes benefit of the therapy.  She does not endorse any other symptomatology.  Overall she feels well and looks well.   Review of Systems A 10 point review of systems was performed and it is as noted above otherwise negative.   Patient Active Problem List    Diagnosis Date Noted   Hypertension    Asthma    Chronic heart failure with preserved ejection fraction (HFpEF) (HCC) 03/11/2021   Edema 11/05/2020   Atrial fibrillation with RVR (HCC) 05/07/2020   Gout    Hypothyroidism    Rheumatoid arthritis (HCC)    Syncope and collapse    Elevated troponin    Hypomagnesemia    Atypical chest pain 10/03/2019   Nonrheumatic aortic valve stenosis 10/03/2019   Palpitations 04/04/2019   Dizziness 04/04/2019   PSVT (paroxysmal supraventricular tachycardia) 04/04/2019   Near syncope 02/20/2019   Labile hypertension 12/12/2017   Paroxysmal atrial fibrillation (HCC) 12/12/2017   Aortic valve disease 12/12/2017    Social History   Tobacco Use   Smoking status: Never   Smokeless tobacco: Never  Substance Use Topics   Alcohol use: No    Allergies  Allergen Reactions   Metronidazole Other (See Comments)    RASH   Sulindac Other (See Comments)    UNKNOWN   Naproxen Rash    Current Meds  Medication Sig   acetaminophen (TYLENOL) 650 MG CR tablet Take 650 mg by mouth every 8 (eight) hours as needed for pain.   Albuterol Sulfate (PROAIR RESPICLICK) 108 (90 Base) MCG/ACT AEPB Inhale 2 puffs into the lungs every 6 (six) hours as needed.   amoxicillin (AMOXIL)  500 MG tablet Take 4 tablets (2,000 mg total) by mouth 2 (two) times daily. For dental procedures only   apixaban (ELIQUIS) 5 MG TABS tablet Take 5 mg by mouth 2 (two) times daily.    b complex vitamins tablet Take 1 tablet by mouth daily.   Bioflavonoid Products (BIOFLEX) TABS Take 2 tablets by mouth daily.   carvedilol (COREG) 6.25 MG tablet Take 1 tablet (6.25 mg total) by mouth 2 (two) times daily.   Cholecalciferol (VITAMIN D3) 1000 UNITS CAPS Take 1 capsule by mouth daily.   Coenzyme Q10 (COQ10) 100 MG CAPS Take 1 capsule by mouth daily.   CRANBERRY PO Take 1 tablet by mouth daily.   famotidine (PEPCID) 20 MG tablet Take 20 mg by mouth daily.   fexofenadine (ALLEGRA) 180 MG tablet  Take 180 mg by mouth as needed.   Fluticasone-Umeclidin-Vilant (TRELEGY ELLIPTA) 100-62.5-25 MCG/ACT AEPB Inhale 1 puff into the lungs daily.   furosemide (LASIX) 40 MG tablet Take Furosemide 40 mg one tablet every am & one-half (20 mg) tablet at noon.   gabapentin (NEURONTIN) 600 MG tablet Take 600 mg by mouth 2 (two) times daily.   Ginger, Zingiber officinalis, (GINGER ROOT PO) Take by mouth daily.   guaiFENesin (MUCINEX) 600 MG 12 hr tablet Take 600 mg by mouth daily.   hydrALAZINE (APRESOLINE) 25 MG tablet TAKE 1 TABLET (25 MG)  BY MOUTH UP TO THREE TIMES A DAY AS NEEDED FOR SYSTOLIC BP OF 170 OR GREATER   levothyroxine (SYNTHROID, LEVOTHROID) 50 MCG tablet Take 1 tablet by mouth daily.   losartan (COZAAR) 50 MG tablet Take 1 tablet (50 mg total) by mouth 2 (two) times daily.   Multiple Vitamins-Minerals (CENTRUM SILVER PO) Take 1 tablet by mouth daily.   Multiple Vitamins-Minerals (PRESERVISION AREDS 2 PO) Take 1 capsule by mouth 2 (two) times daily.    Omega-3 Fatty Acids (FISH OIL) 1000 MG CAPS Take 2 capsules by mouth daily.   PREMARIN vaginal cream Place 1 Applicatorful vaginally in the morning and at bedtime.    psyllium (METAMUCIL) 58.6 % powder Take 1 packet by mouth daily.   TURMERIC PO Take by mouth daily.    Immunization History  Administered Date(s) Administered   Influenza, High Dose Seasonal PF 10/25/2018   Influenza, Quadrivalent, Recombinant, Inj, Pf 12/01/2020   Influenza-Unspecified 09/26/2021   Moderna SARS-COV2 Booster Vaccination 01/08/2020   Moderna Sars-Covid-2 Vaccination 01/19/2019, 02/15/2019   Tdap 03/19/2021   Zoster Recombinat (Shingrix) 05/17/2017, 07/24/2017        Objective:   BP 120/78 (BP Location: Right Arm, Cuff Size: Large)   Pulse 68   Temp 97.8 F (36.6 C)   Ht 5\' 3"  (1.6 m)   Wt 221 lb 6.4 oz (100.4 kg)   SpO2 96%   BMI 39.22 kg/m   SpO2: 96 % O2 Device: None (Room air)  GENERAL: Obese woman, no acute distress.  No  conversational dyspnea.  Ambulates with walker. HEAD: Normocephalic, atraumatic.  Slight right facial droop noted from Bell's palsy. EYES: Pupils equal, round, reactive to light.  No scleral icterus.  MOUTH:  NECK: Supple. No thyromegaly. Trachea midline. No JVD.  No adenopathy. PULMONARY: Good air entry bilaterally.  No adventitious sounds. CARDIOVASCULAR: S1 and S2. Regular rate and rhythm.  Grade 2/6 to 3/6 holosystolic murmur at right sternal border consistent with AS. ABDOMEN: Obese, otherwise benign. MUSCULOSKELETAL: No joint deformity, no clubbing, +1 to +2 edema feet (chronic).  NEUROLOGIC: Right facial droop noted (Bell's palsy),  speech is fluent. SKIN: Intact,warm,dry.  On limited exam, no rashes. PSYCH: Mood and behavior normal.       Assessment & Plan:     ICD-10-CM   1. Moderate persistent asthmatic bronchitis without complication  J45.40    Well compensated on Trelegy Ellipta Continue Trelegy, continuous and albuterol    2. OSA (obstructive sleep apnea)  G47.33    Compliant with CPAP Continue auto CPAP 4 to 20 cm H2O Patient notes benefit of therapy    3. Chronic cough  R05.3    Markedly improved with control of her asthmatic bronchitis     Meds ordered this encounter  Medications   Fluticasone-Umeclidin-Vilant (TRELEGY ELLIPTA) 100-62.5-25 MCG/ACT AEPB    Sig: Inhale 1 puff into the lungs daily.    Dispense:  28 each    Refill:  0    Order Specific Question:   Lot Number?    Answer:   [id number]    Order Specific Question:   Expiration Date?    Answer:   10/04/2023    Order Specific Question:   Quantity    Answer:   2   Will see the patient in follow-up in 4 to 6 months time she is to contact us prior to that time should any new problems arise.    Gailen Shelter, MD Advanced Bronchoscopy PCCM [facility]    *This note was dictated using voice recognition software/Dragon.  Despite best efforts to proofread, errors can occur which can  change the meaning. Any transcriptional errors that result from this process are unintentional and may not be fully corrected at the time of dictation.

## 2022-07-06 DIAGNOSIS — H353211 Exudative age-related macular degeneration, right eye, with active choroidal neovascularization: Secondary | ICD-10-CM | POA: Diagnosis not present

## 2022-08-30 DIAGNOSIS — S46012A Strain of muscle(s) and tendon(s) of the rotator cuff of left shoulder, initial encounter: Secondary | ICD-10-CM | POA: Diagnosis not present

## 2022-08-31 ENCOUNTER — Telehealth: Payer: Self-pay | Admitting: Pulmonary Disease

## 2022-08-31 DIAGNOSIS — S46012A Strain of muscle(s) and tendon(s) of the rotator cuff of left shoulder, initial encounter: Secondary | ICD-10-CM | POA: Diagnosis not present

## 2022-08-31 DIAGNOSIS — H353211 Exudative age-related macular degeneration, right eye, with active choroidal neovascularization: Secondary | ICD-10-CM | POA: Diagnosis not present

## 2022-08-31 MED ORDER — TRELEGY ELLIPTA 100-62.5-25 MCG/ACT IN AEPB: 1.0000 | INHALATION_SPRAY | Freq: Every day | RESPIRATORY_TRACT | 0 refills | Status: DC

## 2022-08-31 NOTE — Telephone Encounter (Signed)
I spoke with the patient's daughter(DPR). She will bring the [employer] paperwork by the office today and pick up a sample of Trelegy.   Nothing further needed.

## 2022-09-01 DIAGNOSIS — I7091 Generalized atherosclerosis: Secondary | ICD-10-CM | POA: Diagnosis not present

## 2022-09-01 MED ORDER — TRELEGY ELLIPTA 100-62.5-25 MCG/ACT IN AEPB: 1.0000 | INHALATION_SPRAY | Freq: Every day | RESPIRATORY_TRACT | 3 refills | Status: DC

## 2022-09-01 NOTE — Telephone Encounter (Addendum)
[employer] assistance paperwork was dropped off. I have printed a prescription for Trelegy. I have faxed it and the paperwork to [employer].  Nothing further needed.

## 2022-09-01 NOTE — Addendum Note (Signed)
Addended by: Bonney Leitz on: 09/01/2022 02:37 PM   Modules accepted: Orders

## 2022-09-07 DIAGNOSIS — M25512 Pain in left shoulder: Secondary | ICD-10-CM | POA: Diagnosis not present

## 2022-09-07 DIAGNOSIS — Z1331 Encounter for screening for depression: Secondary | ICD-10-CM | POA: Diagnosis not present

## 2022-09-07 DIAGNOSIS — M25511 Pain in right shoulder: Secondary | ICD-10-CM | POA: Diagnosis not present

## 2022-09-07 DIAGNOSIS — Z0000 Encounter for general adult medical examination without abnormal findings: Secondary | ICD-10-CM | POA: Diagnosis not present

## 2022-09-07 DIAGNOSIS — Z9181 History of falling: Secondary | ICD-10-CM | POA: Diagnosis not present

## 2022-09-07 DIAGNOSIS — Z139 Encounter for screening, unspecified: Secondary | ICD-10-CM | POA: Diagnosis not present

## 2022-09-11 DIAGNOSIS — K219 Gastro-esophageal reflux disease without esophagitis: Secondary | ICD-10-CM | POA: Diagnosis not present

## 2022-09-11 DIAGNOSIS — M25511 Pain in right shoulder: Secondary | ICD-10-CM | POA: Diagnosis not present

## 2022-09-11 DIAGNOSIS — Z7951 Long term (current) use of inhaled steroids: Secondary | ICD-10-CM | POA: Diagnosis not present

## 2022-09-11 DIAGNOSIS — Z792 Long term (current) use of antibiotics: Secondary | ICD-10-CM | POA: Diagnosis not present

## 2022-09-11 DIAGNOSIS — G8929 Other chronic pain: Secondary | ICD-10-CM | POA: Diagnosis not present

## 2022-09-11 DIAGNOSIS — Z7901 Long term (current) use of anticoagulants: Secondary | ICD-10-CM | POA: Diagnosis not present

## 2022-09-11 DIAGNOSIS — Z96643 Presence of artificial hip joint, bilateral: Secondary | ICD-10-CM | POA: Diagnosis not present

## 2022-09-11 DIAGNOSIS — E039 Hypothyroidism, unspecified: Secondary | ICD-10-CM | POA: Diagnosis not present

## 2022-09-11 DIAGNOSIS — H9193 Unspecified hearing loss, bilateral: Secondary | ICD-10-CM | POA: Diagnosis not present

## 2022-09-11 DIAGNOSIS — M25512 Pain in left shoulder: Secondary | ICD-10-CM | POA: Diagnosis not present

## 2022-09-11 DIAGNOSIS — I10 Essential (primary) hypertension: Secondary | ICD-10-CM | POA: Diagnosis not present

## 2022-09-11 DIAGNOSIS — I4891 Unspecified atrial fibrillation: Secondary | ICD-10-CM | POA: Diagnosis not present

## 2022-09-14 ENCOUNTER — Telehealth: Payer: Self-pay | Admitting: Pulmonary Disease

## 2022-09-14 NOTE — Telephone Encounter (Signed)
Please refer to 09/01/2022 phone note.   [employer] assistance forms were sent on 09/01/2022. I have reviewed documentation and it appears that patient's year to date total for medication is $573.66 [employer] requires a year to date total of 600 dollars before patient will qualify for assistance.  Spoke to patient's daughter, Landry Mellow) and relayed above message. She stated that the letter she received stated that patient did not meet out of pocket total. Olegario Messier stated that she dropped of a [facility] receipt with additional 100 dollars. I am unable to locate this receipt. Olegario Messier will attempt to obtain the receipt and bring it by our office.  Will hold for f/u.

## 2022-09-14 NOTE — Telephone Encounter (Signed)
To clarify the [facility] statement she included showed how much she spends on prescriptions each year . She wants to be sure [employer] saw that as well as the application so there is no overlooking her complete financial assistance needs. .  Please call [phone]  She can get another if needed from [facility]

## 2022-09-14 NOTE — Telephone Encounter (Signed)
Please see last signed encounter  Pt's daughter calling. States she was told that there was "not enough money" for her to get her Trellegy assitance. There should have been included some ppwk from [facility]. She wonders if they saw that.  Please call to advise

## 2022-09-16 DIAGNOSIS — G4733 Obstructive sleep apnea (adult) (pediatric): Secondary | ICD-10-CM | POA: Diagnosis not present

## 2022-09-16 NOTE — Telephone Encounter (Signed)
Icalled PT to advise. She will get another receipt to [place]. NFN

## 2022-09-22 DIAGNOSIS — I480 Paroxysmal atrial fibrillation: Secondary | ICD-10-CM | POA: Diagnosis not present

## 2022-09-22 DIAGNOSIS — G319 Degenerative disease of nervous system, unspecified: Secondary | ICD-10-CM | POA: Diagnosis not present

## 2022-09-22 DIAGNOSIS — Z79899 Other long term (current) drug therapy: Secondary | ICD-10-CM | POA: Diagnosis not present

## 2022-09-22 DIAGNOSIS — R32 Unspecified urinary incontinence: Secondary | ICD-10-CM | POA: Diagnosis not present

## 2022-09-22 DIAGNOSIS — M069 Rheumatoid arthritis, unspecified: Secondary | ICD-10-CM | POA: Diagnosis not present

## 2022-09-22 DIAGNOSIS — I5032 Chronic diastolic (congestive) heart failure: Secondary | ICD-10-CM | POA: Diagnosis not present

## 2022-09-22 DIAGNOSIS — M109 Gout, unspecified: Secondary | ICD-10-CM | POA: Diagnosis not present

## 2022-09-22 DIAGNOSIS — E039 Hypothyroidism, unspecified: Secondary | ICD-10-CM | POA: Diagnosis not present

## 2022-09-22 DIAGNOSIS — I471 Supraventricular tachycardia, unspecified: Secondary | ICD-10-CM | POA: Diagnosis not present

## 2022-09-23 NOTE — Telephone Encounter (Signed)
Patient's daughter dropped off [facility] receipt. Receipt and application has been faxed to [employer].  Received successful fax confirmation.

## 2022-10-12 ENCOUNTER — Other Ambulatory Visit: Payer: Self-pay

## 2022-10-12 MED ORDER — CARVEDILOL 6.25 MG PO TABS: 6.2500 mg | ORAL_TABLET | Freq: Two times a day (BID) | ORAL | 0 refills | Status: DC

## 2022-10-12 NOTE — Telephone Encounter (Signed)
Due for follow up appt, please schedule.  Thanks!

## 2022-10-12 NOTE — Telephone Encounter (Signed)
Requested Prescriptions   Signed Prescriptions Disp Refills   carvedilol (COREG) 6.25 MG tablet 180 tablet 0    Sig: Take 1 tablet (6.25 mg total) by mouth 2 (two) times daily.    Authorizing Provider: END, CHRISTOPHER    Ordering User: Guerry Minors

## 2022-10-17 DIAGNOSIS — H353290 Exudative age-related macular degeneration, unspecified eye, stage unspecified: Secondary | ICD-10-CM | POA: Diagnosis not present

## 2022-10-17 DIAGNOSIS — I480 Paroxysmal atrial fibrillation: Secondary | ICD-10-CM | POA: Diagnosis not present

## 2022-10-17 DIAGNOSIS — D692 Other nonthrombocytopenic purpura: Secondary | ICD-10-CM | POA: Diagnosis not present

## 2022-10-17 DIAGNOSIS — Z6841 Body Mass Index (BMI) 40.0 and over, adult: Secondary | ICD-10-CM | POA: Diagnosis not present

## 2022-10-17 DIAGNOSIS — G4733 Obstructive sleep apnea (adult) (pediatric): Secondary | ICD-10-CM | POA: Diagnosis not present

## 2022-10-17 DIAGNOSIS — Z515 Encounter for palliative care: Secondary | ICD-10-CM | POA: Diagnosis not present

## 2022-10-17 DIAGNOSIS — I10 Essential (primary) hypertension: Secondary | ICD-10-CM | POA: Diagnosis not present

## 2022-10-24 ENCOUNTER — Ambulatory Visit: Payer: PPO | Admitting: Pulmonary Disease

## 2022-10-24 ENCOUNTER — Encounter: Payer: Self-pay | Admitting: Pulmonary Disease

## 2022-10-24 VITALS — BP 126/80 | HR 67 | Temp 97.7°F | Ht 63.0 in | Wt 220.4 lb

## 2022-10-24 DIAGNOSIS — R053 Chronic cough: Secondary | ICD-10-CM | POA: Diagnosis not present

## 2022-10-24 DIAGNOSIS — G4733 Obstructive sleep apnea (adult) (pediatric): Secondary | ICD-10-CM

## 2022-10-24 DIAGNOSIS — J4540 Moderate persistent asthma, uncomplicated: Secondary | ICD-10-CM | POA: Diagnosis not present

## 2022-10-24 NOTE — Patient Instructions (Signed)
VISIT SUMMARY:  During your recent visit, we discussed your asthma, asthmatic bronchitis, and obstructive sleep apnea. You mentioned that you had a significant cough over the past few weeks, which has improved recently. You also mentioned that you switched from Advair to Trelegy due to worsening symptoms, and this change, along with taking Mucinex at night and resuming your allergy medication (Allegra), has led to noticeable improvement. You have been using your CPAP machine regularly for your sleep apnea, and you received your RSV vaccine last year and flu shot this year. You also mentioned some difficulty with phlegm, which occasionally leads to wheezing.  YOUR PLAN:  -ASTHMA AND ASTHMATIC BRONCHITIS: These are conditions that cause your airways to become inflamed and narrow, making it hard to breathe. You should continue taking Trelegy, Mucinex at night as needed, and Allegra for allergy symptoms to manage these conditions.  -OBSTRUCTIVE SLEEP APNEA: This is a condition where your breathing repeatedly stops and starts during sleep. You should continue using your CPAP machine to manage this condition.  -PHLEGM PRODUCTION: This is a common symptom where mucus builds up in your throat or chest. We have ordered an Acapella device from [facility] to assist with phlegm clearance.  -IMMUNIZATIONS: These are shots that help protect you from certain diseases. You received your RSV vaccine last year and flu shot this year, so no additional vaccines are needed at this time.  INSTRUCTIONS:  Please continue with your current medications and start using the Acapella device once it arrives to help with phlegm clearance. We will follow up in 4-6 months to check on your progress.

## 2022-10-24 NOTE — Progress Notes (Signed)
Subjective:    Patient ID: Jessica Kaufman, female    DOB: [birth year], 89 y.o.   MRN: [id number]  Patient Care Team: Ailene Ravel, MD as PCP - General (Family Medicine) End, Cristal Deer, MD as PCP - Cardiology (Cardiology) Salena Saner, MD as Consulting Physician (Pulmonary Disease)  Chief Complaint  Patient presents with   Follow-up    DOE. Wheezing. Normal cough with clear sputum.    BACKGROUND/INTERVAL:Jessica Kaufman is a 89 year old lifelong never smoker who presents for follow-up on the issue of asthma and asthmatic bronchitis. She was last seen here on 22 February 2022.  At that time she was doing well on Trelegy Ellipta and on her CPAP for obstructive sleep apnea.    HPI Discussed the use of AI scribe software for clinical note transcription with the patient, who gave verbal consent to proceed.  History of Present Illness   The patient is a 89 year old lifelong non-smoker with a history of asthma, asthmatic bronchitis, and obstructive sleep apnea managed with CPAP. She presented for a follow-up visit. Over the past two to three weeks, the patient experienced a significant cough, which has shown some improvement in the last two days. Approximately three to four weeks ago, the patient had to switch to Advair due to inability to pay for Trelegy ("donut hole"). However, the patient's condition seemed to deteriorate further on Advair, coinciding with the onset of allergy season.  The patient was not taking allergy medication at the time and was not using Mucinex. The patient then switched back to Trelegy and started taking Mucinex at night, which led to noticeable improvement. The patient also resumed taking allergy medication (Allegra). The patient's cough, which had been a long-standing issue, seemed to return slightly with the switch to Advair but improved again with Trelegy.  The patient has been compliant with CPAP for obstructive sleep apnea. The patient received an RSV  vaccine last year and a flu shot this year. The patient also reported some difficulty with phlegm, which occasionally leads to wheezing, but this resolves once the phlegm is cleared. The patient uses Mucinex in these situations.   CPAP compliance has been excellent.  Usage days 30 out of 30 at 100% compliance, usage over 4 hours at 97% compliance.  Residual AHI 4.9.  Patient is currently on AutoSet 4 to 20 cm H2O average pressure 7 to 8 cm H2O.    Review of Systems A 10 point review of systems was performed and it is as noted above otherwise negative.   Patient Active Problem List   Diagnosis Date Noted   Hypertension    Asthma    Chronic heart failure with preserved ejection fraction (HFpEF) (HCC) 03/11/2021   Edema 11/05/2020   Atrial fibrillation with RVR (HCC) 05/07/2020   Gout    Hypothyroidism    Rheumatoid arthritis (HCC)    Syncope and collapse    Elevated troponin    Hypomagnesemia    Atypical chest pain 10/03/2019   Nonrheumatic aortic valve stenosis 10/03/2019   Palpitations 04/04/2019   Dizziness 04/04/2019   PSVT (paroxysmal supraventricular tachycardia) (HCC) 04/04/2019   Near syncope 02/20/2019   Labile hypertension 12/12/2017   Paroxysmal atrial fibrillation (HCC) 12/12/2017   Aortic valve disease 12/12/2017    Social History   Tobacco Use   Smoking status: Never   Smokeless tobacco: Never  Substance Use Topics   Alcohol use: No    Allergies  Allergen Reactions   Metronidazole Other (See Comments)    RASH  Sulindac Other (See Comments)    UNKNOWN   Naproxen Rash    Current Meds  Medication Sig   acetaminophen (TYLENOL) 650 MG CR tablet Take 650 mg by mouth every 8 (eight) hours as needed for pain.   Albuterol Sulfate (PROAIR RESPICLICK) 108 (90 Base) MCG/ACT AEPB Inhale 2 puffs into the lungs every 6 (six) hours as needed.   amoxicillin (AMOXIL) 500 MG tablet Take 4 tablets (2,000 mg total) by mouth 2 (two) times daily. For dental procedures only    apixaban (ELIQUIS) 5 MG TABS tablet Take 5 mg by mouth 2 (two) times daily.    b complex vitamins tablet Take 1 tablet by mouth daily.   Bioflavonoid Products (BIOFLEX) TABS Take 2 tablets by mouth daily.   carvedilol (COREG) 6.25 MG tablet Take 1 tablet (6.25 mg total) by mouth 2 (two) times daily.   Cholecalciferol (VITAMIN D3) 1000 UNITS CAPS Take 1 capsule by mouth daily.   Coenzyme Q10 (COQ10) 100 MG CAPS Take 1 capsule by mouth daily.   CRANBERRY PO Take 1 tablet by mouth daily.   famotidine (PEPCID) 20 MG tablet Take 20 mg by mouth daily.   fexofenadine (ALLEGRA) 180 MG tablet Take 180 mg by mouth as needed.   Fluticasone-Umeclidin-Vilant (TRELEGY ELLIPTA) 100-62.5-25 MCG/ACT AEPB Inhale 1 puff into the lungs daily.   furosemide (LASIX) 40 MG tablet Take Furosemide 40 mg one tablet every am & one-half (20 mg) tablet at noon.   gabapentin (NEURONTIN) 600 MG tablet Take 600 mg by mouth 2 (two) times daily.   Ginger, Zingiber officinalis, (GINGER ROOT PO) Take by mouth daily.   guaiFENesin (MUCINEX) 600 MG 12 hr tablet Take 600 mg by mouth daily.   hydrALAZINE (APRESOLINE) 25 MG tablet TAKE 1 TABLET (25 MG)  BY MOUTH UP TO THREE TIMES A DAY AS NEEDED FOR SYSTOLIC BP OF 170 OR GREATER   levothyroxine (SYNTHROID, LEVOTHROID) 50 MCG tablet Take 1 tablet by mouth daily.   losartan (COZAAR) 50 MG tablet Take 1 tablet (50 mg total) by mouth 2 (two) times daily.   Multiple Vitamins-Minerals (CENTRUM SILVER PO) Take 1 tablet by mouth daily.   Multiple Vitamins-Minerals (PRESERVISION AREDS 2 PO) Take 1 capsule by mouth 2 (two) times daily.    MYRBETRIQ 25 MG TB24 tablet Take 25 mg by mouth daily.   Omega-3 Fatty Acids (FISH OIL) 1000 MG CAPS Take 2 capsules by mouth daily.   PREMARIN vaginal cream Place 1 Applicatorful vaginally in the morning and at bedtime.    psyllium (METAMUCIL) 58.6 % powder Take 1 packet by mouth daily.   TURMERIC PO Take by mouth daily.    Immunization History   Administered Date(s) Administered   Fluad Quad(high Dose 65+) 10/05/2022   Influenza, High Dose Seasonal PF 10/25/2018   Influenza, Quadrivalent, Recombinant, Inj, Pf 12/01/2020   Influenza-Unspecified 09/26/2021   Moderna SARS-COV2 Booster Vaccination 01/08/2020   Moderna Sars-Covid-2 Vaccination 01/19/2019, 02/15/2019   RSV,unspecified 10/26/2021   Tdap 03/19/2021   Zoster Recombinant(Shingrix) 05/17/2017, 07/24/2017        Objective:   BP 126/80 (BP Location: Right Arm, Cuff Size: Large)   Pulse 67   Temp 97.7 F (36.5 C)   Ht 5\' 3"  (1.6 m)   Wt 220 lb 6.4 oz (100 kg)   SpO2 97%   BMI 39.04 kg/m   SpO2: 97 % O2 Device: None (Room air)  GENERAL: Obese woman, no acute distress.  No conversational dyspnea.  Ambulates with walker. HEAD:  Normocephalic, atraumatic.  Slight right facial droop noted from Bell's palsy. EYES: Pupils equal, round, reactive to light.  No scleral icterus.  MOUTH:  NECK: Supple. No thyromegaly. Trachea midline. No JVD.  No adenopathy. PULMONARY: Good air entry bilaterally.  No adventitious sounds. CARDIOVASCULAR: S1 and S2. Regular rate and rhythm.  Grade 2/6 to 3/6 holosystolic murmur at right sternal border consistent with AS. ABDOMEN: Obese, otherwise benign. MUSCULOSKELETAL: No joint deformity, no clubbing, +1 to +2 edema feet (chronic).  NEUROLOGIC: Right facial droop noted (Bell's palsy), speech is fluent. SKIN: Intact,warm,dry.  On limited exam, no rashes. PSYCH: Mood and behavior normal.  Assessment & Plan:     ICD-10-CM   1. Moderate persistent asthmatic bronchitis without complication  J45.40 AMB REFERRAL FOR DME    2. OSA (obstructive sleep apnea)  G47.33     3. Chronic cough  R05.3      Orders Placed This Encounter  Procedures   AMB REFERRAL FOR DME    Referral Priority:   Routine    Referral Type:   Durable Medical Equipment Purchase    Number of Visits Requested:   1   Assessment and Plan    Asthma and Asthmatic  Bronchitis Recent exacerbation with increased cough, improved with change from Advair to Trelegy. Concurrent allergy symptoms likely contributing to exacerbation. -Continue Trelegy. -Continue Mucinex at night as needed. -Continue Allegra for allergy symptoms.  Obstructive Sleep Apnea Well controlled on CPAP. -Continue CPAP.  Phlegm Production Occasional difficulty clearing phlegm. -Order Acapella device from [facility] to assist with phlegm clearance.  Immunizations Received RSV vaccine last year and flu shot this year. -No additional vaccines needed at this time.  Follow-up in 4-6 months.      Gailen Shelter, MD Advanced Bronchoscopy PCCM [facility]    *This note was dictated using voice recognition software/Dragon.  Despite best efforts to proofread, errors can occur which can change the meaning. Any transcriptional errors that result from this process are unintentional and may not be fully corrected at the time of dictation.

## 2022-10-26 DIAGNOSIS — H353211 Exudative age-related macular degeneration, right eye, with active choroidal neovascularization: Secondary | ICD-10-CM | POA: Diagnosis not present

## 2022-11-03 ENCOUNTER — Other Ambulatory Visit: Payer: Self-pay

## 2022-11-03 MED ORDER — HYDRALAZINE HCL 25 MG PO TABS: ORAL_TABLET | ORAL | 0 refills | Status: DC

## 2022-11-09 ENCOUNTER — Telehealth: Payer: Self-pay | Admitting: Internal Medicine

## 2022-11-09 MED ORDER — CARVEDILOL 6.25 MG PO TABS: 6.2500 mg | ORAL_TABLET | Freq: Two times a day (BID) | ORAL | 0 refills | Status: DC

## 2022-11-09 MED ORDER — LOSARTAN POTASSIUM 50 MG PO TABS: 50.0000 mg | ORAL_TABLET | Freq: Two times a day (BID) | ORAL | 1 refills | Status: DC

## 2022-11-09 NOTE — Telephone Encounter (Signed)
*  STAT* If patient is at the pharmacy, call can be transferred to refill team.   1. Which medications need to be refilled? (please list name of each medication and dose if known) carvedilol (COREG) 6.25 MG tablet    losartan (COZAAR) 50 MG tablet    2. Which pharmacy/location (including street and city if local pharmacy) is medication to be sent to? [facility] [place], [place] - [address] [address]   3. Do they need a 30 day or 90 day supply? 90

## 2022-11-15 DIAGNOSIS — D485 Neoplasm of uncertain behavior of skin: Secondary | ICD-10-CM | POA: Diagnosis not present

## 2022-11-21 ENCOUNTER — Ambulatory Visit: Payer: PPO | Attending: Physician Assistant | Admitting: Physician Assistant

## 2022-11-21 ENCOUNTER — Encounter: Payer: Self-pay | Admitting: Physician Assistant

## 2022-11-21 VITALS — BP 155/80 | HR 58 | Wt 216.6 lb

## 2022-11-21 DIAGNOSIS — I5032 Chronic diastolic (congestive) heart failure: Secondary | ICD-10-CM

## 2022-11-21 DIAGNOSIS — I480 Paroxysmal atrial fibrillation: Secondary | ICD-10-CM

## 2022-11-21 DIAGNOSIS — Z87898 Personal history of other specified conditions: Secondary | ICD-10-CM

## 2022-11-21 DIAGNOSIS — I350 Nonrheumatic aortic (valve) stenosis: Secondary | ICD-10-CM | POA: Diagnosis not present

## 2022-11-21 DIAGNOSIS — R0989 Other specified symptoms and signs involving the circulatory and respiratory systems: Secondary | ICD-10-CM

## 2022-11-21 NOTE — Progress Notes (Signed)
Cardiology Office Note    Date:  11/21/2022   ID:  Jessica Kaufman, DOB 1925/06/27, MRN [id number]  PCP:  Ailene Ravel, MD  Cardiologist:  Yvonne Kendall, MD  Electrophysiologist:  None   Chief Complaint: Follow-up  History of Present Illness:   Jessica Kaufman is a 89 y.o. female with history of PAF, HFpEF, labile hypertension, mild to moderate aortic stenosis, syncope, history of CVA noted on CT imaging, PVCs, HLD, mechanical falls, and rheumatoid arthritis who presents for follow-up of HFpEF, PAF, and labile hypertension.   Zio patch in 02/2019 showed a predominant rhythm of sinus with an average rate of 62 bpm (range 41 to 107 bpm in sinus), 39 episodes of paroxysmal SVT lasting up to 10 beats with a maximum rate of 184 bpm, rare PACs and PVCs, brief episodes of junctional rhythm occurred often in the setting of PACs and PVCs, patient triggered events corresponded to sinus rhythm, PACs, PVCs, and transient junctional rhythm.  Echo in 03/2019 showed an EF of 55 to 60%, no regional wall motion abnormalities, normal LV diastolic function parameters, normal RV systolic function and ventricular cavity size, trivial mitral regurgitation, moderate aortic stenosis, and an estimated right atrial pressure of 3 mmHg.  Echo in 05/2020 showed an EF of 50 to 55%, mild LVH, indeterminate LV diastolic function parameters, normal RV systolic function and ventricular cavity size, mild mitral regurgitation, moderate aortic stenosis, and an estimated right atrial pressure of 8 mmHg.  Most recent echo from 01/2021 demonstrated an EF of 60 to 65%, no regional wall motion abnormalities, mild LVH, grade 1 diastolic dysfunction, normal RV systolic function and ventricular cavity size, mild mitral regurgitation, and mild to moderate aortic valve stenosis with a mean gradient of 15 mmHg and a valve area of 1.58 cm.  She was seen in the ED in 07/2021 with episode of LOC occurred while eating breakfast associated  with palpitations.  BP was soft at 60/36 with EMS initially noting A-fib with controlled ventricular response.  Subsequent Zio patch from 07/2021 showed a predominant rhythm of sinus with an average rate of 66 bpm (range 50 to 100 bpm in sinus), PAF occurred with an overall burden of 24% with an average ventricular rate of 110 bpm with the longest episode lasting 15 hours and 32 minutes, posttermination pauses lasting up to 3.3 seconds occurred, 128 episode of SVT with the longest lasting 22.9 seconds, and rare PACs and PVCs.  Patient triggered events corresponded to A-fib and PVCs.  Given these findings, she was evaluated by EP in 09/2021 with post termination pauses not felt to be underlying etiology of syncopal episode.  Episode was felt to be primarily vagal mediated.  It was recommended the patient get a Fitbit and carvedilol was titrated to 6.25 mg twice daily.  She was seen by general cardiology in 12/2021 noting continued labile blood pressures with readings ranging from 91/54 to 206/107.  Furosemide was decreased to 20 mg daily.  She was maintaining sinus rhythm.  She followed up with the EP in 01/2022 without any interval events, though with continued orthostatic lightheadedness with recommendation to allow for a more permissive blood pressure to 180 mmHg systolic.  She was last seen by general cardiology in 04/2022 and was doing very well from a cardiac perspective.  Lower extremity swelling was improved.  She was wearing compression stockings.  Blood pressure readings were stable.  She underwent echo on 05/25/2022 showed an EF of 60 to 65%, no regional  wall motion abnormalities, grade 1 diastolic dysfunction, normal RV systolic function and ventricular cavity size, mild mitral regurgitation, mild to moderate aortic valve stenosis with a mean gradient of 13.3 mmHg and a valve area of 1.2 cm.  She comes in accompanied by her daughter today and continues to do very well from a cardiac perspective.  No  dyspnea, palpitations, presyncope, or syncope.  She does have some inner ear issues with some rotational dizziness.  Wearing compression socks with improved lower extremity swelling.  Continues to have labile hypertension with BPs in the 190s to low 200s before taking medications that improves largely into the 150s systolic thereafter.  Currently taking losartan 50 mg twice daily, carvedilol 6.25 mg twice daily furosemide 20 mg at noon, and 20 mg in the morning as needed.  Typically taking a hydralazine around 4 PM.  Happy with current regimen.  No acute cardiac concerns at this time.   Labs independently reviewed: 09/2022 - Hgb 11.9, PLT 328, BUN 13, serum creatinine 0.67, potassium 4.8, albumin 4.0, AST/ALT normal, TSH normal 05/2020 - TC 199, TG 88, HDL 59, LDL 122, A1c 5.5  Past Medical History:  Diagnosis Date   Aortic valve disease    Mild-moderate aortic stenosis   Asthma    Cancer (HCC)    basal cell    Gout    Hyperlipidemia    Hypertension    Hypertensive heart disease without heart failure    Hypothyroidism    PAF (paroxysmal atrial fibrillation) (HCC)    PVC (premature ventricular contraction)    Rheumatoid arthritis (HCC)    Ventricular premature depolarization     Past Surgical History:  Procedure Laterality Date   BLADDER SURGERY  10/19/2010   BONY PELVIS SURGERY  1990   BREAST BIOPSY Left 1986   CARDIAC SURGERY  2009   PVC   CATARACT EXTRACTION Left 11/15/00   CATARACT EXTRACTION Right    ELBOW SURGERY  06/1990   HIP SURGERY Left 05/28/09   HIP SURGERY Right 06/30/03   HYSTEROSCOPY  06/17/1986   vaginal    Current Medications: Current Meds  Medication Sig   acetaminophen (TYLENOL) 650 MG CR tablet Take 650 mg by mouth every 8 (eight) hours as needed for pain.   Albuterol Sulfate (PROAIR RESPICLICK) 108 (90 Base) MCG/ACT AEPB Inhale 2 puffs into the lungs every 6 (six) hours as needed.   amoxicillin (AMOXIL) 500 MG tablet Take 4 tablets (2,000 mg total) by  mouth 2 (two) times daily. For dental procedures only   apixaban (ELIQUIS) 5 MG TABS tablet Take 5 mg by mouth 2 (two) times daily.    b complex vitamins tablet Take 1 tablet by mouth daily.   Bioflavonoid Products (BIOFLEX) TABS Take 2 tablets by mouth daily.   carvedilol (COREG) 6.25 MG tablet Take 1 tablet (6.25 mg total) by mouth 2 (two) times daily.   Cholecalciferol (VITAMIN D3) 1000 UNITS CAPS Take 1 capsule by mouth daily.   Coenzyme Q10 (COQ10) 100 MG CAPS Take 1 capsule by mouth daily.   famotidine (PEPCID) 20 MG tablet Take 20 mg by mouth daily.   fexofenadine (ALLEGRA) 180 MG tablet Take 180 mg by mouth as needed.   Fluticasone-Umeclidin-Vilant (TRELEGY ELLIPTA) 100-62.5-25 MCG/ACT AEPB Inhale 1 puff into the lungs daily.   furosemide (LASIX) 40 MG tablet Take Furosemide 40 mg one tablet every am & one-half (20 mg) tablet at noon.   gabapentin (NEURONTIN) 600 MG tablet Take 600 mg by mouth 2 (two) times  daily.   Ginger, Zingiber officinalis, (GINGER ROOT PO) Take by mouth daily.   guaiFENesin (MUCINEX) 600 MG 12 hr tablet Take 600 mg by mouth daily.   hydrALAZINE (APRESOLINE) 25 MG tablet TAKE 1 TABLET (25 MG)  BY MOUTH UP TO THREE TIMES A DAY AS NEEDED FOR SYSTOLIC BP OF 170 OR GREATER   levothyroxine (SYNTHROID, LEVOTHROID) 50 MCG tablet Take 1 tablet by mouth daily.   losartan (COZAAR) 50 MG tablet Take 1 tablet (50 mg total) by mouth 2 (two) times daily.   Multiple Vitamins-Minerals (CENTRUM SILVER PO) Take 1 tablet by mouth daily.   Multiple Vitamins-Minerals (PRESERVISION AREDS 2 PO) Take 1 capsule by mouth 2 (two) times daily.    MYRBETRIQ 25 MG TB24 tablet Take 25 mg by mouth daily.   PREMARIN vaginal cream Place 1 Applicatorful vaginally in the morning and at bedtime.   psyllium (METAMUCIL) 58.6 % powder Take 1 packet by mouth daily.   TURMERIC PO Take by mouth daily.    Allergies:   Metronidazole, Sulindac, and Naproxen   Social History   Socioeconomic History    Marital status: Widowed    Spouse name: Not on file   Number of children: 7   Years of education: 12th   Highest education level: Not on file  Occupational History   Occupation: Retired  Tobacco Use   Smoking status: Never   Smokeless tobacco: Never  Vaping Use   Vaping status: Never Used  Substance and Sexual Activity   Alcohol use: No   Drug use: No   Sexual activity: Not on file  Other Topics Concern   Not on file  Social History Narrative   Patient lives at home alone.Caffeine Use: 1 cup daily 7 children (1 deceased)   Social Determinants of Health   Financial Resource Strain: Not on file  Food Insecurity: Not on file  Transportation Needs: Not on file  Physical Activity: Not on file  Stress: Not on file  Social Connections: Not on file     Family History:  The patient's family history includes Breast cancer in her daughter; Heart attack in her father and maternal grandmother; Heart failure in her sister; Melanoma in her mother and sister; Stroke in her mother; Uterine cancer in her sister.  ROS:   12-point review of systems is negative unless otherwise noted in the HPI.   EKGs/Labs/Other Studies Reviewed:    Studies reviewed were summarized above. The additional studies were reviewed today:  2D echo 05/25/2022: 1. Left ventricular ejection fraction, by estimation, is 60 to 65%. The  left ventricle has normal function. The left ventricle has no regional  wall motion abnormalities. Left ventricular diastolic parameters are  consistent with Grade I diastolic  dysfunction (impaired relaxation).   2. Right ventricular systolic function is normal. The right ventricular  size is normal. Tricuspid regurgitation signal is inadequate for assessing  PA pressure.   3. The mitral valve is normal in structure. Mild mitral valve  regurgitation. No evidence of mitral stenosis.   4. The aortic valve is normal in structure. There is severe calcifcation  of the aortic valve.  Aortic valve regurgitation is mild. Mild to moderate  aortic valve stenosis. Aortic valve area, by VTI measures 1.20 cm. Aortic  valve mean gradient measures  13.3 mmHg. Aortic valve Vmax measures 2.49 m/s.   5. The inferior vena cava is normal in size with greater than 50%  respiratory variability, suggesting right atrial pressure of 3 mmHg.  Comparison(s): 02/01/21: 60-65%, mild LVH, AS w .  __________  Zio patch 07/2021:   The patient was monitored for 14 days.   The predominant rhythm was sinus with an average rate of 66 bpm in sinus (range 50-100 bpm in sinus).   Paroxysmal atrial fibrillation occurred with an average ventricular rate of 110 bpm (range 59-188 bpm).  The longest episode lasted 15 hours, 32 minutes.  Atrial fibrillation burden was 24%.   Post-termination pauses lasting up to 3.3 seconds occurred.   There were rare PAC's and PVC's.  128 supraventricular runs occurred, lasting up to 22.9 seconds with a maximum rate of 194 bpm.   Patient triggered events correspond to atrial fibrillation and PVC's.   Sinus rhythm with paroxysmal atrial fibrillation and post-termination pauses of up to 3.3 seconds, as detailed above.  PSVT also noted. __________   2D echo 02/01/2021: 1. Left ventricular ejection fraction, by estimation, is 60 to 65%. Left  ventricular ejection fraction by 2D MOD biplane is 61.2 %. The left  ventricle has normal function. The left ventricle has no regional wall  motion abnormalities. There is mild left  ventricular hypertrophy. Left ventricular diastolic parameters are  consistent with Grade I diastolic dysfunction (impaired relaxation).   2. Right ventricular systolic function is normal. The right ventricular  size is normal.   3. The mitral valve is normal in structure. Mild mitral valve  regurgitation.   4. Mean aortic valve gradient 15 mmHg, peak gradient 22 mmHg, Vmax  1.20m/s, AVA 1.58cm2.. The aortic valve is calcified. Aortic valve   regurgitation is not visualized. Mild to moderate aortic valve stenosis.   5. The inferior vena cava is normal in size with greater than 50%  respiratory variability, suggesting right atrial pressure of 3 mmHg.   Comparison(s): EF 55-60%; Moderate AS. __________   2D echo 05/08/2020: 1. Left ventricular ejection fraction, by estimation, is 50 to 55%. The  left ventricle has low normal function. Left ventricular endocardial  border not optimally defined to evaluate regional wall motion. There is  mild left ventricular hypertrophy. Left  ventricular diastolic parameters are indeterminate.   2. Right ventricular systolic function is normal. The right ventricular  size is normal.   3. The mitral valve is normal in structure. Mild mitral valve  regurgitation. No evidence of mitral stenosis.   4. The aortic valve was not well visualized. Aortic valve regurgitation  is not visualized. Moderate aortic valve stenosis. Aortic gradient does  not seem accurate. Aortic stenosis seems at least moderate based on valve  morphology and restricted opening.   5. The inferior vena cava is normal in size with <50% respiratory  variability, suggesting right atrial pressure of 8 mmHg. __________   2D echo 04/03/2019: 1. Left ventricular ejection fraction, by estimation, is 55 to 60%. The  left ventricle has normal function. The left ventricle has no regional  wall motion abnormalities. Left ventricular diastolic parameters were  normal.   2. Right ventricular systolic function is normal. The right ventricular  size is normal.   3. The mitral valve is grossly normal. Trivial mitral valve  regurgitation.   4. The aortic valve was not well visualized. Aortic valve regurgitation  is not visualized. Moderate aortic valve stenosis.   5. The inferior vena cava is normal in size with greater than 50%  respiratory variability, suggesting right atrial pressure of 3 mmHg. __________   [place] patch 02/2019: The  patient was monitored for 13 days, 23 hours. The predominant  rhythm was sinus with an average rate of 62 bpm (range 41 to 107 bpm in sinus). Rare PACs and PVCs occurred. There were 39 episodes of paroxysmal supraventricular tachycardia lasting up to 10 beats with a maximum rate of 184 bpm. Brief episodes of junctional rhythm occurred, often in the setting of PACs and PVCs. Patient triggered events correspond to sinus rhythm, PACs, and PVCs transient junctional rhythm.   Predominately sinus rhythm with rare PACs and PVCs.  39 episodes of brief PSVT occurred, as well as transient junctional rhythm (often in the setting of PACs and PVCs).   EKG:  EKG is ordered today.  The EKG ordered today demonstrates sinus bradycardia with sinus arrhythmia, 58 bpm, nonspecific ST-T changes, consistent with prior tracings  Recent Labs: No results found for requested labs within last 365 days.  Recent Lipid Panel    Component Value Date/Time   CHOL 199 05/08/2020 0522   TRIG 88 05/08/2020 0522   HDL 59 05/08/2020 0522   CHOLHDL 3.4 05/08/2020 0522   VLDL 18 05/08/2020 0522   LDLCALC 122 (H) 05/08/2020 0522    PHYSICAL EXAM:    VS:  BP (!) 155/80 (BP Location: Left Arm, Patient Position: Sitting, Cuff Size: Normal)   Pulse (!) 58   Wt 216 lb 9.6 oz (98.2 kg)   SpO2 97%   BMI 38.37 kg/m   BMI: Body mass index is 38.37 kg/m.  Physical Exam Vitals reviewed.  Constitutional:      Appearance: She is well-developed.  HENT:     Head: Normocephalic and atraumatic.  Eyes:     General:        Right eye: No discharge.        Left eye: No discharge.  Neck:     Vascular: No JVD.  Cardiovascular:     Rate and Rhythm: Normal rate and regular rhythm.     Pulses:          Posterior tibial pulses are 2+ on the right side and 2+ on the left side.     Heart sounds: S1 normal and S2 normal. Heart sounds not distant. No midsystolic click and no opening snap. Murmur heard.     Systolic murmur is present  with a grade of 2/6 at the upper right sternal border.     No friction rub.  Pulmonary:     Effort: Pulmonary effort is normal. No respiratory distress.     Breath sounds: Normal breath sounds. No decreased breath sounds, wheezing, rhonchi or rales.  Chest:     Chest wall: No tenderness.  Abdominal:     General: There is no distension.  Musculoskeletal:     Cervical back: Normal range of motion.     Right lower leg: No edema.     Left lower leg: No edema.     Comments: Compression socks in place.  Skin:    General: Skin is warm and dry.     Nails: There is no clubbing.  Neurological:     Mental Status: She is alert and oriented to person, place, and time.  Psychiatric:        Speech: Speech normal.        Behavior: Behavior normal.        Thought Content: Thought content normal.        Judgment: Judgment normal.     Wt Readings from Last 3 Encounters:  11/21/22 216 lb 9.6 oz (98.2 kg)  10/24/22 220 lb 6.4 oz (100 kg)  06/23/22 221 lb 6.4 oz (100.4 kg)     ASSESSMENT & PLAN:   Labile hypertension: Blood pressure has overall been well-controlled at home and is well-controlled in the office today.  We will continue to allow for permissive blood pressure readings.  Continue current regimen of carvedilol 6.25 mg twice daily, losartan 50 mg twice daily, and hydralazine 25 mg as needed for systolic blood pressure greater than 170 mmHg.  Low-sodium diet is encouraged.  HFpEF: Euvolemic and well compensated on current dose of furosemide 20 mg as needed in the morning and 20 mg at noon.  Defer addition of of MRA or SGLT2 inhibitor in the setting of advanced age.  Recent renal function and potassium stable.  Aortic stenosis: Stable, mild to moderate stenosis by echo in 05/2022.  Monitor periodically.  PAF: Maintaining sinus rhythm with carvedilol 6.25 mg twice daily.  CHA2DS2-VASc at least 7 (CHF, HTN, age x 2, CVA x 2, sex category).  She remains on apixaban 5 mg twice daily and does  not meet reduced dosing criteria.  No falls or symptoms concerning for bleeding.  Labs stable.  History of syncope: No further episodes.  Felt to be consistent with vasovagal etiology, though post termination pauses of up to 3.3 seconds were noted on prior event monitor.  Follow-up with EP as directed.    Disposition: F/u with Dr. Okey Dupre or an APP in 6 months.   Medication Adjustments/Labs and Tests Ordered: Current medicines are reviewed at length with the patient today.  Concerns regarding medicines are outlined above. Medication changes, Labs and Tests ordered today are summarized above and listed in the Patient Instructions accessible in Encounters.   Signed, Eula Listen, PA-C 11/21/2022 1:14 PM     [facility] - [place] [address] [place], [place] [address] [phone]

## 2022-11-21 NOTE — Patient Instructions (Signed)
Medication Instructions:  Your Physician recommend you continue on your current medication as directed.    *If you need a refill on your cardiac medications before your next appointment, please call your pharmacy*   Lab Work: None ordered   Follow-Up: At [facility], you and your health needs are our priority.  As part of our continuing mission to provide you with exceptional heart care, we have created designated Provider Care Teams.  These Care Teams include your primary Cardiologist (physician) and Advanced Practice Providers (APPs -  Physician Assistants and Nurse Practitioners) who all work together to provide you with the care you need, when you need it.  We recommend signing up for the patient portal called "MyChart".  Sign up information is provided on this After Visit Summary.  MyChart is used to connect with patients for Virtual Visits (Telemedicine).  Patients are able to view lab/test results, encounter notes, upcoming appointments, etc.  Non-urgent messages can be sent to your provider as well.   To learn more about what you can do with MyChart, go to [email or web address].    Your next appointment:   6 month(s)  Provider:   You may see Yvonne Kendall, MD or one of the following Advanced Practice Providers on your designated Care Team:   Eula Listen, [place]

## 2022-11-28 ENCOUNTER — Encounter: Payer: Self-pay | Admitting: Internal Medicine

## 2022-11-28 ENCOUNTER — Observation Stay: Payer: PPO

## 2022-11-28 ENCOUNTER — Observation Stay
Admission: EM | Admit: 2022-11-28 | Discharge: 2022-11-29 | Disposition: A | Discharge status: Home / Self Care | Payer: PPO | Attending: Internal Medicine | Admitting: Internal Medicine

## 2022-11-28 ENCOUNTER — Other Ambulatory Visit: Payer: Self-pay

## 2022-11-28 ENCOUNTER — Emergency Department: Payer: PPO

## 2022-11-28 DIAGNOSIS — I5032 Chronic diastolic (congestive) heart failure: Secondary | ICD-10-CM | POA: Diagnosis present

## 2022-11-28 DIAGNOSIS — E039 Hypothyroidism, unspecified: Secondary | ICD-10-CM | POA: Diagnosis not present

## 2022-11-28 DIAGNOSIS — I110 Hypertensive heart disease with heart failure: Secondary | ICD-10-CM | POA: Insufficient documentation

## 2022-11-28 DIAGNOSIS — R262 Difficulty in walking, not elsewhere classified: Secondary | ICD-10-CM | POA: Diagnosis not present

## 2022-11-28 DIAGNOSIS — I480 Paroxysmal atrial fibrillation: Secondary | ICD-10-CM | POA: Diagnosis not present

## 2022-11-28 DIAGNOSIS — M069 Rheumatoid arthritis, unspecified: Secondary | ICD-10-CM | POA: Diagnosis present

## 2022-11-28 DIAGNOSIS — J45909 Unspecified asthma, uncomplicated: Secondary | ICD-10-CM | POA: Diagnosis not present

## 2022-11-28 DIAGNOSIS — Z7901 Long term (current) use of anticoagulants: Secondary | ICD-10-CM | POA: Insufficient documentation

## 2022-11-28 DIAGNOSIS — I10 Essential (primary) hypertension: Secondary | ICD-10-CM | POA: Diagnosis present

## 2022-11-28 DIAGNOSIS — Z85828 Personal history of other malignant neoplasm of skin: Secondary | ICD-10-CM | POA: Insufficient documentation

## 2022-11-28 DIAGNOSIS — N390 Urinary tract infection, site not specified: Secondary | ICD-10-CM | POA: Diagnosis not present

## 2022-11-28 DIAGNOSIS — R531 Weakness: Secondary | ICD-10-CM | POA: Diagnosis not present

## 2022-11-28 DIAGNOSIS — I6782 Cerebral ischemia: Secondary | ICD-10-CM | POA: Diagnosis not present

## 2022-11-28 DIAGNOSIS — I639 Cerebral infarction, unspecified: Secondary | ICD-10-CM | POA: Diagnosis not present

## 2022-11-28 DIAGNOSIS — G319 Degenerative disease of nervous system, unspecified: Secondary | ICD-10-CM | POA: Diagnosis not present

## 2022-11-28 DIAGNOSIS — Z79899 Other long term (current) drug therapy: Secondary | ICD-10-CM | POA: Diagnosis not present

## 2022-11-28 DIAGNOSIS — G459 Transient cerebral ischemic attack, unspecified: Secondary | ICD-10-CM | POA: Diagnosis not present

## 2022-11-28 DIAGNOSIS — R4182 Altered mental status, unspecified: Secondary | ICD-10-CM | POA: Diagnosis present

## 2022-11-28 DIAGNOSIS — R0989 Other specified symptoms and signs involving the circulatory and respiratory systems: Secondary | ICD-10-CM | POA: Diagnosis present

## 2022-11-28 DIAGNOSIS — R4781 Slurred speech: Secondary | ICD-10-CM | POA: Diagnosis not present

## 2022-11-28 DIAGNOSIS — I359 Nonrheumatic aortic valve disorder, unspecified: Secondary | ICD-10-CM | POA: Diagnosis present

## 2022-11-28 DIAGNOSIS — I350 Nonrheumatic aortic (valve) stenosis: Secondary | ICD-10-CM

## 2022-11-28 LAB — COMPREHENSIVE METABOLIC PANEL
ALT: 16 U/L (ref 0–44)
AST: 34 U/L (ref 15–41)
Albumin: 3.4 g/dL — ABNORMAL LOW (ref 3.5–5.0)
Alkaline Phosphatase: 75 U/L (ref 38–126)
Anion gap: 11 (ref 5–15)
BUN: 16 mg/dL (ref 8–23)
CO2: 20 mmol/L — ABNORMAL LOW (ref 22–32)
Calcium: 8.5 mg/dL — ABNORMAL LOW (ref 8.9–10.3)
Chloride: 101 mmol/L (ref 98–111)
Creatinine, Ser: 0.64 mg/dL (ref 0.44–1.00)
GFR, Estimated: >60 mL/min (ref >60)
Glucose, Bld: 79 mg/dL (ref 70–99)
Potassium: 4.6 mmol/L (ref 3.5–5.1)
Sodium: 132 mmol/L — ABNORMAL LOW (ref 135–145)
Total Bilirubin: 1.2 mg/dL — ABNORMAL HIGH (ref <1.2)
Total Protein: 6.2 g/dL — ABNORMAL LOW (ref 6.5–8.1)

## 2022-11-28 LAB — URINALYSIS, ROUTINE W REFLEX MICROSCOPIC
Bilirubin Urine: NEGATIVE
Glucose, UA: NEGATIVE mg/dL
Hgb urine dipstick: NEGATIVE
Ketones, ur: NEGATIVE mg/dL
Nitrite: POSITIVE — AB
Protein, ur: NEGATIVE mg/dL
Specific Gravity, Urine: 1.005 (ref 1.005–1.030)
WBC, UA: >50 WBC/hpf (ref 0–5)
pH: 7 (ref 5.0–8.0)

## 2022-11-28 LAB — DIFFERENTIAL
Abs Immature Granulocytes: 0.02 10*3/uL (ref 0.00–0.07)
Basophils Absolute: 0.1 10*3/uL (ref 0.0–0.1)
Basophils Relative: 1 %
Eosinophils Absolute: 0.2 10*3/uL (ref 0.0–0.5)
Eosinophils Relative: 3 %
Immature Granulocytes: 0 %
Lymphocytes Relative: 28 %
Lymphs Abs: 2.2 10*3/uL (ref 0.7–4.0)
Monocytes Absolute: 0.9 10*3/uL (ref 0.1–1.0)
Monocytes Relative: 11 %
Neutro Abs: 4.6 10*3/uL (ref 1.7–7.7)
Neutrophils Relative %: 57 %

## 2022-11-28 LAB — CBC
HCT: 36.7 % (ref 36.0–46.0)
Hemoglobin: 11.9 g/dL — ABNORMAL LOW (ref 12.0–15.0)
MCH: 30.8 pg (ref 26.0–34.0)
MCHC: 32.4 g/dL (ref 30.0–36.0)
MCV: 95.1 fL (ref 80.0–100.0)
Platelets: 294 10*3/uL (ref 150–400)
RBC: 3.86 MIL/uL — ABNORMAL LOW (ref 3.87–5.11)
RDW: 14.5 % (ref 11.5–15.5)
WBC: 8 10*3/uL (ref 4.0–10.5)
nRBC: 0 % (ref 0.0–0.2)

## 2022-11-28 LAB — URINE DRUG SCREEN, QUALITATIVE (ARMC ONLY)
Amphetamines, Ur Screen: NOT DETECTED
Barbiturates, Ur Screen: NOT DETECTED
Benzodiazepine, Ur Scrn: NOT DETECTED
Cannabinoid 50 Ng, Ur ~~LOC~~: NOT DETECTED
Cocaine Metabolite,Ur ~~LOC~~: NOT DETECTED
MDMA (Ecstasy)Ur Screen: NOT DETECTED
Methadone Scn, Ur: NOT DETECTED
Opiate, Ur Screen: NOT DETECTED
Phencyclidine (PCP) Ur S: NOT DETECTED
Tricyclic, Ur Screen: NOT DETECTED

## 2022-11-28 LAB — PROTIME-INR
INR: 1.2 (ref 0.8–1.2)
Prothrombin Time: 15.5 s — ABNORMAL HIGH (ref 11.4–15.2)

## 2022-11-28 LAB — APTT: aPTT: 32 s (ref 24–36)

## 2022-11-28 MED ORDER — GUAIFENESIN ER 600 MG PO TB12: 600.0000 mg | ORAL_TABLET | Freq: Two times a day (BID) | ORAL | Status: DC | PRN

## 2022-11-28 MED ORDER — STROKE: EARLY STAGES OF RECOVERY BOOK: Freq: Once | Status: AC

## 2022-11-28 MED ORDER — HYDRALAZINE HCL 50 MG PO TABS
25.0000 mg | ORAL_TABLET | Freq: Three times a day (TID) | ORAL | Status: DC | PRN
  Administered 2022-11-28: 25 mg via ORAL
  Filled 2022-11-28: qty 1

## 2022-11-28 MED ORDER — CARVEDILOL 6.25 MG PO TABS: 6.2500 mg | ORAL_TABLET | Freq: Two times a day (BID) | ORAL | Status: DC

## 2022-11-28 MED ORDER — ALBUTEROL SULFATE (2.5 MG/3ML) 0.083% IN NEBU: 2.5000 mg | INHALATION_SOLUTION | Freq: Four times a day (QID) | RESPIRATORY_TRACT | Status: DC | PRN

## 2022-11-28 MED ORDER — VITAMIN D3 25 MCG (1000 UNIT) PO TABS
1000.0000 [IU] | ORAL_TABLET | Freq: Every day | ORAL | Status: DC
  Administered 2022-11-29: 1000 [IU] via ORAL
  Filled 2022-11-28 (×2): qty 1

## 2022-11-28 MED ORDER — METHOTREXATE SODIUM 2.5 MG PO TABS
12.0000 mg | ORAL_TABLET | ORAL | Status: DC
  Filled 2022-11-28: qty 5

## 2022-11-28 MED ORDER — MIRABEGRON ER 25 MG PO TB24
25.0000 mg | ORAL_TABLET | Freq: Every day | ORAL | Status: DC
  Administered 2022-11-29: 25 mg via ORAL
  Filled 2022-11-28: qty 1

## 2022-11-28 MED ORDER — CARVEDILOL 6.25 MG PO TABS
6.2500 mg | ORAL_TABLET | Freq: Two times a day (BID) | ORAL | Status: DC
  Administered 2022-11-29: 6.25 mg via ORAL
  Filled 2022-11-28: qty 1

## 2022-11-28 MED ORDER — GABAPENTIN 300 MG PO CAPS
600.0000 mg | ORAL_CAPSULE | Freq: Two times a day (BID) | ORAL | Status: DC
  Administered 2022-11-28 – 2022-11-29 (×2): 600 mg via ORAL
  Filled 2022-11-28 (×2): qty 2

## 2022-11-28 MED ORDER — FOLIC ACID 1 MG PO TABS
1.0000 mg | ORAL_TABLET | Freq: Every day | ORAL | Status: DC
  Filled 2022-11-28: qty 1

## 2022-11-28 MED ORDER — LEVOTHYROXINE SODIUM 50 MCG PO TABS
50.0000 ug | ORAL_TABLET | Freq: Every day | ORAL | Status: DC
  Administered 2022-11-29: 50 ug via ORAL
  Filled 2022-11-28: qty 1

## 2022-11-28 MED ORDER — HEPARIN SODIUM (PORCINE) 5000 UNIT/ML IJ SOLN
5000.0000 [IU] | Freq: Three times a day (TID) | INTRAMUSCULAR | Status: DC
Start: 2022-11-29 — End: 2022-11-28

## 2022-11-28 MED ORDER — ACETAMINOPHEN 325 MG PO TABS
650.0000 mg | ORAL_TABLET | ORAL | Status: DC | PRN
  Administered 2022-11-28: 650 mg via ORAL
  Filled 2022-11-28: qty 2

## 2022-11-28 MED ORDER — ASPIRIN 81 MG PO CHEW
324.0000 mg | CHEWABLE_TABLET | Freq: Once | ORAL | Status: AC
Start: 2022-11-28 — End: 2022-11-28
  Administered 2022-11-28: 324 mg via ORAL
  Filled 2022-11-28: qty 4

## 2022-11-28 MED ORDER — ACETAMINOPHEN 160 MG/5ML PO SOLN: 650.0000 mg | ORAL | Status: DC | PRN

## 2022-11-28 MED ORDER — BIOFLEX PO TABS: 1.0000 | ORAL_TABLET | Freq: Two times a day (BID) | ORAL | Status: DC

## 2022-11-28 MED ORDER — APIXABAN 5 MG PO TABS
5.0000 mg | ORAL_TABLET | Freq: Two times a day (BID) | ORAL | Status: DC
  Administered 2022-11-28 – 2022-11-29 (×2): 5 mg via ORAL
  Filled 2022-11-28 (×2): qty 1

## 2022-11-28 MED ORDER — UMECLIDINIUM BROMIDE 62.5 MCG/ACT IN AEPB
1.0000 | INHALATION_SPRAY | Freq: Every day | RESPIRATORY_TRACT | Status: DC
  Administered 2022-11-29: 1 via RESPIRATORY_TRACT
  Filled 2022-11-28: qty 7

## 2022-11-28 MED ORDER — ACETAMINOPHEN 650 MG RE SUPP: 650.0000 mg | RECTAL | Status: DC | PRN

## 2022-11-28 MED ORDER — FAMOTIDINE 20 MG PO TABS
20.0000 mg | ORAL_TABLET | Freq: Every day | ORAL | Status: DC
  Administered 2022-11-29: 20 mg via ORAL
  Filled 2022-11-28: qty 1

## 2022-11-28 MED ORDER — PRESERVISION AREDS 2 PO CAPS: ORAL_CAPSULE | Freq: Two times a day (BID) | ORAL | Status: DC

## 2022-11-28 MED ORDER — SENNOSIDES-DOCUSATE SODIUM 8.6-50 MG PO TABS: 1.0000 | ORAL_TABLET | Freq: Every evening | ORAL | Status: DC | PRN

## 2022-11-28 MED ORDER — HYDRALAZINE HCL 20 MG/ML IJ SOLN
10.0000 mg | Freq: Four times a day (QID) | INTRAMUSCULAR | Status: DC | PRN
  Filled 2022-11-28: qty 1

## 2022-11-28 MED ORDER — FLUTICASONE FUROATE-VILANTEROL 100-25 MCG/ACT IN AEPB
1.0000 | INHALATION_SPRAY | Freq: Every day | RESPIRATORY_TRACT | Status: DC
  Administered 2022-11-29: 1 via RESPIRATORY_TRACT
  Filled 2022-11-28: qty 28

## 2022-11-28 NOTE — H&P (Signed)
History and Physical   Jessica Kaufman:[id number] DOB: 29-Mar-1925 DOA: 11/28/2022  PCP: Jessica Ravel, MD  Outpatient Specialists: Dr. Jayme Kaufman, pulmonologist Patient coming from: [facility]  I have personally briefly reviewed patient's old medical records in [facility] EMR.  Chief Concern: Right-sided weakness, slurred speech  HPI: Ms. Jessica Kaufman is a 89 year old female with history of hypothyroid, labile hypertension, history of paroxysmal atrial fibrillation, history of CVA noted on CT imaging, mild to moderate aortic stenosis, history of syncope, history of mechanical falls, rheumatoid arthritis, moderate persistent asthmatic bronchitis, hyperlipidemia, history of hypertensive heart disease without heart failure, premature ventricular contraction, history of gout, who presents to the emergency department from [facility] nursing facility for chief concerns of right-sided weakness and slurred speech lasting approximately 15 minutes.  Vitals in the ED showed temperature of 98.4, respiration rate of 16, heart rate 62, blood pressure 163/71, currently 203/92, SpO2 100% on room air.  Serum sodium is 132, potassium 4.6, chloride 101, bicarb 20, BUN of 16, serum creatinine 2.64, EGFR greater than 60, nonfasting blood glucose 79, WBC 8.0, hemoglobin 11.9, platelets of 294.  CT head wo contrast: Was read as no acute intracranial findings.  Chronic microvascular ischemia and cortical atrophy.  ED treatment: Aspirin 324 mg -------------------------------- At bedside, patient was able to tell me her name, age, location, current calendar year.  Patient reports that she was on her way to the beauty salon at [place].  She was sitting waiting her turn and then when the hair stylist came to get her, she had difficulty getting up.  They helped her to get up and she did get her hair washed.  She was then sitting to get her pedicure done, her feet in the water when her  daughter was called due to concerns of mental status change.  Patient does not remember having slurred speech.  She endorses having both leg weakness.  This lasted approximately 15 to 20 minutes.  She reports she has never felt this way before.  She reports that her head has been feeling woozy, dizzy for the past several days.  She denies changes to her hearing.  Patient reports she still feels woozy.  Social history: She lives at [place].  She denies tobacco, EtOH, recreational drug use.  She is retired.  ROS: Constitutional: no weight change, no fever ENT/Mouth: no sore throat, no rhinorrhea Eyes: no eye pain, no vision changes Cardiovascular: no chest pain, no dyspnea,  no edema, no palpitations Respiratory: no cough, no sputum, no wheezing Gastrointestinal: no nausea, no vomiting, no diarrhea, no constipation Genitourinary: no urinary incontinence, no dysuria, no hematuria Musculoskeletal: no arthralgias, no myalgias Skin: no skin lesions, no pruritus, Neuro: + bilateral lower extremity weakness, no loss of consciousness, no syncope, + dizziness Psych: no anxiety, no depression, no decrease appetite Heme/Lymph: no bruising, no bleeding  ED Course: Discussed with the EDP, patient requiring hospitalization for chief concerns of TIA.  Assessment/Plan  Principal Problem:   TIA (transient ischemic attack) Active Problems:   Chronic heart failure with preserved ejection fraction (HFpEF) (HCC)   Hypertension   Hypothyroidism   Labile hypertension   Paroxysmal atrial fibrillation (HCC)   Aortic valve disease   Nonrheumatic aortic valve stenosis   Rheumatoid arthritis (HCC)   Assessment and Plan:  * TIA (transient ischemic attack) Stroke-like symptoms, resolved after 15 minutes, suspect TIA Complete echo MRI brain wo CM has been ordered Fasting lipid and A1c ordered Permissive hypertension pending MRI of brain Frequent  neuro vascular checks PT, OT Fall precaution A.m.  team to consult neurology pending MRI Admit to telemetry medical, observation  Chronic heart failure with preserved ejection fraction (HFpEF) (HCC) Echo ordered on admission  Hypothyroidism Home levothyroxine 50 mcg daily resumed  Rheumatoid arthritis (HCC) Home methotrexate 12.5 mg p.o. weekly resumed for 11/29/2022  Paroxysmal atrial fibrillation (HCC) Home Eliquis 5 mg p.o. twice daily, Coreg 6.25 mg p.o. twice daily were resumed on admission  Labile hypertension Home Coreg 6.25 mg p.o. twice daily were resumed on admission for 11/28/2022 Hydralazine 10 mg IV every 6 hours as needed for SBP greater 175, 7 days ordered Home hydrochlorothiazide, losartan, furosemide were not resumed on admission pending MRI for possible stroke AM team to resume home antihypertensive medications when the benefits outweigh the risk  Chart reviewed.   DVT prophylaxis: Heparin 5000 units subcutaneous every 8 hours Code Status: DNR/DNI Diet: Heart healthy Family Communication: Discussed and updated with daughter, Jessica Kaufman at bedside with patient's permission Disposition Plan: Pending clinical course Consults called: None at this time Admission status: Telemetry medical, observation  Past Medical History:  Diagnosis Date   Aortic valve disease    Mild-moderate aortic stenosis   Asthma    Cancer (HCC)    basal cell    Gout    Hyperlipidemia    Hypertension    Hypertensive heart disease without heart failure    Hypothyroidism    PAF (paroxysmal atrial fibrillation) (HCC)    PVC (premature ventricular contraction)    Rheumatoid arthritis (HCC)    Ventricular premature depolarization    Past Surgical History:  Procedure Laterality Date   BLADDER SURGERY  10/19/2010   BONY PELVIS SURGERY  1990   BREAST BIOPSY Left 1986   CARDIAC SURGERY  2009   PVC   CATARACT EXTRACTION Left 11/15/00   CATARACT EXTRACTION Right    ELBOW SURGERY  06/1990   HIP SURGERY Left 05/28/09   HIP SURGERY Right  06/30/03   HYSTEROSCOPY  06/17/1986   vaginal   Social History:  reports that she has never smoked. She has never used smokeless tobacco. She reports that she does not drink alcohol and does not use drugs.  Allergies  Allergen Reactions   Metronidazole Other (See Comments)    RASH   Sulindac Other (See Comments)    UNKNOWN   Naproxen Rash   Family History  Problem Relation Age of Onset   Stroke Mother    Melanoma Mother    Heart attack Father    Uterine cancer Sister    Melanoma Sister    Heart failure Sister    Heart attack Maternal Grandmother    Breast cancer Daughter    Family history: Family history reviewed and not pertinent  Prior to Admission medications   Medication Sig Start Date End Date Taking? Authorizing Provider  acetaminophen (TYLENOL) 650 MG CR tablet Take 650 mg by mouth every 8 (eight) hours as needed for pain.    [provider]  Albuterol Sulfate (PROAIR RESPICLICK) 108 (90 Base) MCG/ACT AEPB Inhale 2 puffs into the lungs every 6 (six) hours as needed. 12/21/20   Salena Saner, MD  amoxicillin (AMOXIL) 500 MG tablet Take 4 tablets (2,000 mg total) by mouth 2 (two) times daily. For dental procedures only 05/09/20   Rai, Delene Ruffini, MD  apixaban (ELIQUIS) 5 MG TABS tablet Take 5 mg by mouth 2 (two) times daily.     [provider]  b complex vitamins tablet Take 1  tablet by mouth daily.    [provider]  Bioflavonoid Products (BIOFLEX) TABS Take 2 tablets by mouth daily.    [provider]  carvedilol (COREG) 6.25 MG tablet Take 1 tablet (6.25 mg total) by mouth 2 (two) times daily. 11/09/22   End, Cristal Deer, MD  Cholecalciferol (VITAMIN D3) 1000 UNITS CAPS Take 1 capsule by mouth daily.    [provider]  Coenzyme Q10 (COQ10) 100 MG CAPS Take 1 capsule by mouth daily.    [provider]  CRANBERRY PO Take 1 tablet by mouth daily. Patient not taking: Reported on 11/21/2022    [provider]   famotidine (PEPCID) 20 MG tablet Take 20 mg by mouth daily. 08/02/20   [provider]  fexofenadine (ALLEGRA) 180 MG tablet Take 180 mg by mouth as needed.    [provider]  Fluticasone-Umeclidin-Vilant (TRELEGY ELLIPTA) 100-62.5-25 MCG/ACT AEPB Inhale 1 puff into the lungs daily. 09/01/22   Salena Saner, MD  furosemide (LASIX) 40 MG tablet Take Furosemide 40 mg one tablet every am & one-half (20 mg) tablet at noon. 12/01/21   Creig Hines, NP  gabapentin (NEURONTIN) 600 MG tablet Take 600 mg by mouth 2 (two) times daily. 01/27/20   [provider]  Ginger, Zingiber officinalis, (GINGER ROOT PO) Take by mouth daily.    [provider]  guaiFENesin (MUCINEX) 600 MG 12 hr tablet Take 600 mg by mouth daily.    [provider]  hydrALAZINE (APRESOLINE) 25 MG tablet TAKE 1 TABLET (25 MG)  BY MOUTH UP TO THREE TIMES A DAY AS NEEDED FOR SYSTOLIC BP OF 170 OR GREATER 29/56/21   Duke Salvia, MD  levothyroxine (SYNTHROID, LEVOTHROID) 50 MCG tablet Take 1 tablet by mouth daily. 06/10/13   [provider]  losartan (COZAAR) 50 MG tablet Take 1 tablet (50 mg total) by mouth 2 (two) times daily. 11/09/22   End, Cristal Deer, MD  Multiple Vitamins-Minerals (CENTRUM SILVER PO) Take 1 tablet by mouth daily.    [provider]  Multiple Vitamins-Minerals (PRESERVISION AREDS 2 PO) Take 1 capsule by mouth 2 (two) times daily.     [provider]  MYRBETRIQ 25 MG TB24 tablet Take 25 mg by mouth daily. 10/05/22   [provider]  Omega-3 Fatty Acids (FISH OIL) 1000 MG CAPS Take 2 capsules by mouth daily. Patient not taking: Reported on 11/21/2022    [provider]  PREMARIN vaginal cream Place 1 Applicatorful vaginally in the morning and at bedtime. 10/29/18   [provider]  psyllium (METAMUCIL) 58.6 % powder Take 1 packet by mouth daily.    [provider]  TURMERIC PO Take by mouth daily.     [provider]   Physical Exam: Vitals:   11/28/22 1430 11/28/22 1537 11/28/22 1629 11/28/22 1652  BP: (!) 172/67 (!) 177/70 (!) 162/72 (!) 187/73  Pulse: 66 66 65 63  Resp: 15 18 15 16   Temp:  97.7 F (36.5 C) 98 F (36.7 C) 98.7 F (37.1 C)  TempSrc:  Oral    SpO2: 100% 96% 97% 96%  Weight:  101 kg    Height:  5\' 3"  (1.6 m)     Constitutional: appears younger than chronological age, NAD, calm Eyes: PERRL, lids and conjunctivae normal ENMT: Mucous membranes are moist. Posterior pharynx clear of any exudate or lesions. Age-appropriate dentition. Hearing appropriate.  Left tympanic membrane not visible due to cerumen deposits blocking tympanic membrane.  Right  tympanic membrane was clear, cone of light was visible, negative for visual signs of infection Neck: normal, supple, no masses, no thyromegaly Respiratory: clear to auscultation bilaterally, no wheezing, no crackles. Normal respiratory effort. No accessory muscle use.  Cardiovascular: Regular rate and rhythm, no murmurs / rubs / gallops. No extremity edema. 2+ pedal pulses. No carotid bruits.  Abdomen: Morbidly obese abdomen, no tenderness, no masses palpated, no hepatosplenomegaly. Bowel sounds positive.  Musculoskeletal: no clubbing / cyanosis. No joint deformity upper and lower extremities. Good ROM, no contractures, no atrophy. Normal muscle tone.  Skin: no rashes, lesions, ulcers. No induration Neurologic: Sensation intact. Strength 5/5 in all 4.  Psychiatric: Normal judgment and insight. Alert and oriented x 3. Normal mood.   EKG: independently reviewed, showing sinus rhythm with rate of 60, QTc 438  Chest x-ray on Admission: Not indicated at this time  CT Head Wo Contrast  Result Date: 11/28/2022 CLINICAL DATA:  TIA.  Slurred speech.  RIGHT-sided weakness EXAM: CT HEAD WITHOUT CONTRAST TECHNIQUE: Contiguous axial images were obtained from the base of the skull through the vertex without intravenous contrast.  RADIATION DOSE REDUCTION: This exam was performed according to the departmental dose-optimization program which includes automated exposure control, adjustment of the mA and/or kV according to patient size and/or use of iterative reconstruction technique. COMPARISON:  None Available. FINDINGS: Brain: No acute intracranial hemorrhage. No focal mass lesion. No CT evidence of acute infarction. No midline shift or mass effect. No hydrocephalus. Basilar cisterns are patent. There are periventricular and subcortical white matter hypodensities. Generalized cortical atrophy. Remote small deep white matter infarction in the RIGHT corona radiata unchanged from prior. Vascular: No hyperdense vessel or unexpected calcification. Skull: Normal. Negative for fracture or focal lesion. Sinuses/Orbits: Paranasal sinuses and mastoid air cells are clear. Orbits are clear. Other: None. IMPRESSION: 1. No acute intracranial findings. 2. Chronic microvascular ischemia and cortical atrophy. Electronically Signed   By: Genevive Bi M.D.   On: 11/28/2022 11:30    Labs on Admission: I have personally reviewed following labs  CBC: Recent Labs  Lab 11/28/22 1050  WBC 8.0  NEUTROABS 4.6  HGB 11.9*  HCT 36.7  MCV 95.1  PLT 294   Basic Metabolic Panel: Recent Labs  Lab 11/28/22 1050  NA 132*  K 4.6  CL 101  CO2 20*  GLUCOSE 79  BUN 16  CREATININE 0.64  CALCIUM 8.5*   GFR: Estimated Creatinine Clearance: 47.7 mL/min (by C-G formula based on SCr of 0.64 mg/dL).  Liver Function Tests: Recent Labs  Lab 11/28/22 1050  AST 34  ALT 16  ALKPHOS 75  BILITOT 1.2*  PROT 6.2*  ALBUMIN 3.4*   Coagulation Profile: Recent Labs  Lab 11/28/22 1111  INR 1.2   Urine analysis:    Component Value Date/Time   COLORURINE YELLOW (A) 11/28/2022 1330   APPEARANCEUR CLOUDY (A) 11/28/2022 1330   LABSPEC 1.005 11/28/2022 1330   PHURINE 7.0 11/28/2022 1330   GLUCOSEU NEGATIVE 11/28/2022 1330   HGBUR NEGATIVE 11/28/2022  1330   BILIRUBINUR NEGATIVE 11/28/2022 1330   KETONESUR NEGATIVE 11/28/2022 1330   PROTEINUR NEGATIVE 11/28/2022 1330   UROBILINOGEN 1.0 05/20/2009 1356   NITRITE POSITIVE (A) 11/28/2022 1330   LEUKOCYTESUR LARGE (A) 11/28/2022 1330   This document was prepared using Dragon Voice Recognition software and may include unintentional dictation errors.  Dr. Sedalia Muta [facility]  If 7PM-7AM, please contact overnight-coverage provider If 7AM-7PM, please contact day attending provider [email or web address]  11/28/2022, 5:58 PM

## 2022-11-28 NOTE — Assessment & Plan Note (Signed)
Home methotrexate 12.5 mg p.o. weekly resumed for 11/29/2022

## 2022-11-28 NOTE — Assessment & Plan Note (Signed)
Home Coreg 6.25 mg p.o. twice daily were resumed on admission for 11/28/2022 Hydralazine 10 mg IV every 6 hours as needed for SBP greater 175, 7 days ordered Home hydrochlorothiazide, losartan, furosemide were not resumed on admission pending MRI for possible stroke AM team to resume home antihypertensive medications when the benefits outweigh the risk

## 2022-11-28 NOTE — Hospital Course (Addendum)
  Brief Narrative:  89 year old female with history of hypothyroid, labile hypertension, history of paroxysmal atrial fibrillation, history of CVA noted on CT imaging, mild to moderate aortic stenosis, history of syncope, history of mechanical falls, rheumatoid arthritis, moderate persistent asthmatic bronchitis, hyperlipidemia, history of hypertensive heart disease without heart failure, premature ventricular contraction, history of gout, who presents to the emergency department from [facility] nursing facility for chief concerns of right-sided weakness and slurred speech lasting approximately 15 minutes.  CT of the head is negative. MRI is neg.  Wants go home today as she is feeling better.    Assessment & Plan:  Principal Problem:   TIA (transient ischemic attack) Active Problems:   Chronic heart failure with preserved ejection fraction (HFpEF) (HCC)   Hypertension   Hypothyroidism   Labile hypertension   Paroxysmal atrial fibrillation (HCC)   Aortic valve disease   Nonrheumatic aortic valve stenosis   Rheumatoid arthritis (HCC)    Right-sided weakness and slurred speech, TIA Suspect TIA type symptoms.  CT head and MRI brain are negative.  Patient is already on Eliquis A1c pending, LDL 123 Start Lipitor (tells me she wants to try it again, in the past has had myalgias).   Urinary tract infection - has increase in freq and urgency, Will prescribe 5 days of keflex.    Chronic heart failure with preserved ejection fraction (HFpEF) (HCC) Echocardiogram in May 2024 showed EF of 65%, grade 2 DD.  No need to repeat   Hypothyroidism Home levothyroxine 50 mcg daily resumed   Rheumatoid arthritis (HCC) Home methotrexate 12.5 mg p.o. weekly resumed for 11/29/2022   Paroxysmal atrial fibrillation (HCC) Home Eliquis 5 mg p.o. twice daily, Coreg 6.25 mg p.o. twice daily were resumed on admission   Labile hypertension Continue home Coreg.  Eventually will resume losartan.   DVT  prophylaxis: apixaban (ELIQUIS) tablet 5 mg  Code Status: DNR Family Communication:  Daughter at bedside DC today    Subjective: Doing better no complaints.    Examination:  General exam: Appears calm and comfortable  Respiratory system: Clear to auscultation. Respiratory effort normal. Cardiovascular system: S1 & S2 heard, RRR. No JVD, murmurs, rubs, gallops or clicks. No pedal edema. Gastrointestinal system: Abdomen is nondistended, soft and nontender. No organomegaly or masses felt. Normal bowel sounds heard. Central nervous system: Alert and oriented. No focal neurological deficits. Extremities: Symmetric 5 x 5 power. Skin: No rashes, lesions or ulcers Psychiatry: Judgement and insight appear normal. Mood & affect appropriate.

## 2022-11-28 NOTE — ED Provider Notes (Signed)
[facility] Provider Note    Event Date/Time   First MD Initiated Contact with Patient 11/28/22 1035     (approximate)   History   Chief Complaint Altered Mental Status   HPI  Jessica Kaufman is a 89 y.o. female with past medical history of hypertension, atrial fibrillation on Eliquis, CHF, and rheumatoid arthritis who presents to the ED for altered mental status.  Patient reports that she went to breakfast at her nursing facility today and then went to the beauty parlor, attempted to stand up out of the chair but was unable to push herself upwards.  Staff at the facility reported patient seemed weak in her right arm and right leg, was also having difficulty speaking at that time.  Symptoms started around 9:30 AM and lasted for 15 minutes before resolving.  Patient states she does not remember exactly what happened, currently states she feels well and denies any symptoms.  She denies any similar episodes in the past.     Physical Exam   Triage Vital Signs: ED Triage Vitals  Encounter Vitals Group     BP 11/28/22 1035 (!) 163/71     Systolic BP Percentile --      Diastolic BP Percentile --      Pulse Rate 11/28/22 1045 62     Resp 11/28/22 1045 16     Temp 11/28/22 1044 98.4 F (36.9 C)     Temp Source 11/28/22 1044 Oral     SpO2 11/28/22 1044 100 %     Weight --      Height --      Head Circumference --      Peak Flow --      Pain Score 11/28/22 1044 0     Pain Loc --      Pain Education --      Exclude from Growth Chart --     Most recent vital signs: Vitals:   11/28/22 1044 11/28/22 1045  BP:    Pulse:  62  Resp:  16  Temp: 98.4 F (36.9 C)   SpO2: 100% 100%    Constitutional: Alert and oriented. Eyes: Conjunctivae are normal. Head: Atraumatic. Nose: No congestion/rhinnorhea. Mouth/Throat: Mucous membranes are moist.  Cardiovascular: Normal rate, regular rhythm. Grossly normal heart sounds.  2+ radial pulses  bilaterally. Respiratory: Normal respiratory effort.  No retractions. Lungs CTAB. Gastrointestinal: Soft and nontender. No distention. Musculoskeletal: No lower extremity tenderness nor edema.  Neurologic:  Normal speech and language. No gross focal neurologic deficits are appreciated.    ED Results / Procedures / Treatments   Labs (all labs ordered are listed, but only abnormal results are displayed) Labs Reviewed  CBC - Abnormal; Notable for the following components:      Result Value   RBC 3.86 (*)    Hemoglobin 11.9 (*)    All other components within normal limits  COMPREHENSIVE METABOLIC PANEL - Abnormal; Notable for the following components:   Sodium 132 (*)    CO2 20 (*)    Calcium 8.5 (*)    Total Protein 6.2 (*)    Albumin 3.4 (*)    Total Bilirubin 1.2 (*)    All other components within normal limits  PROTIME-INR - Abnormal; Notable for the following components:   Prothrombin Time 15.5 (*)    All other components within normal limits  DIFFERENTIAL  APTT  URINALYSIS, ROUTINE W REFLEX MICROSCOPIC     EKG  ED ECG REPORT I,  Chesley Noon, the attending physician, personally viewed and interpreted this ECG.   Date: 11/28/2022  EKG Time: 10:41  Rate: 60  Rhythm: normal sinus rhythm  Axis: Normal  Intervals:first-degree A-V block   ST&T Change: None  RADIOLOGY CT head reviewed and interpreted by me with no hemorrhage or midline shift.  PROCEDURES:  Critical Care performed: No  Procedures   MEDICATIONS ORDERED IN ED: Medications  aspirin chewable tablet 324 mg (has no administration in time range)     IMPRESSION / MDM / ASSESSMENT AND PLAN / ED COURSE  I reviewed the triage vital signs and the nursing notes.                              89 y.o. female with past medical history of hypertension, CHF, atrial fibrillation on Eliquis, and rheumatoid arthritis who presents to the ED following 15 minutes of right-sided weakness with difficulty  speaking.  Patient's presentation is most consistent with acute presentation with potential threat to life or bodily function.  Differential diagnosis includes, but is not limited to, stroke, TIA, anemia, electrolyte abnormality, arrhythmia, UTI.  Patient nontoxic-appearing and in no acute distress, vital signs are unremarkable.  She is alert and oriented with no focal deficits at the time of my evaluation, episode does sound concerning for TIA.  CT head is negative for acute process, labs are reassuring with no significant anemia, leukocytosis, electrolyte abnormality, or AKI.  LFTs and coags are also unremarkable.  Patient given loading dose of aspirin and case discussed with hospitalist for admission.      FINAL CLINICAL IMPRESSION(S) / ED DIAGNOSES   Final diagnoses:  TIA (transient ischemic attack)     Rx / DC Orders   ED Discharge Orders     None        Note:  This document was prepared using Dragon voice recognition software and may include unintentional dictation errors.   Chesley Noon, MD 11/28/22 1158

## 2022-11-28 NOTE — Assessment & Plan Note (Addendum)
Stroke-like symptoms, resolved after 15 minutes, suspect TIA Complete echo MRI brain wo CM has been ordered Fasting lipid and A1c ordered Permissive hypertension pending MRI of brain Frequent neuro vascular checks PT, OT Fall precaution A.m. team to consult neurology pending MRI Admit to telemetry medical, observation

## 2022-11-28 NOTE — Assessment & Plan Note (Signed)
Echo ordered on admission

## 2022-11-28 NOTE — ED Triage Notes (Signed)
Pt to ED via EMS from [facility] 0930 with R sided weakness, slurred speech, and abnormal gait that resolved at 0945. Pt is A&O x4 and denies complaints on arrival.

## 2022-11-28 NOTE — Assessment & Plan Note (Signed)
Home levothyroxine 50 mcg daily resumed

## 2022-11-28 NOTE — Assessment & Plan Note (Signed)
Home Eliquis 5 mg p.o. twice daily, Coreg 6.25 mg p.o. twice daily were resumed on admission

## 2022-11-29 DIAGNOSIS — G459 Transient cerebral ischemic attack, unspecified: Secondary | ICD-10-CM | POA: Diagnosis not present

## 2022-11-29 LAB — LIPID PANEL
Cholesterol: 192 mg/dL (ref 0–200)
HDL: 48 mg/dL (ref >40)
LDL Cholesterol: 123 mg/dL — ABNORMAL HIGH (ref 0–99)
Total CHOL/HDL Ratio: 4 {ratio}
Triglycerides: 105 mg/dL (ref <150)
VLDL: 21 mg/dL (ref 0–40)

## 2022-11-29 LAB — HEMOGLOBIN A1C
Hgb A1c MFr Bld: 5.4 % (ref 4.8–5.6)
Mean Plasma Glucose: 108.28 mg/dL

## 2022-11-29 MED ORDER — ATORVASTATIN CALCIUM 40 MG PO TABS: 40.0000 mg | ORAL_TABLET | Freq: Every day | ORAL | 0 refills | Status: DC

## 2022-11-29 MED ORDER — TRAZODONE HCL 50 MG PO TABS: 50.0000 mg | ORAL_TABLET | Freq: Every evening | ORAL | Status: DC | PRN

## 2022-11-29 MED ORDER — HYDRALAZINE HCL 20 MG/ML IJ SOLN: 10.0000 mg | INTRAMUSCULAR | Status: DC | PRN

## 2022-11-29 MED ORDER — ONDANSETRON HCL 4 MG/2ML IJ SOLN: 4.0000 mg | Freq: Four times a day (QID) | INTRAMUSCULAR | Status: DC | PRN

## 2022-11-29 MED ORDER — CEPHALEXIN 500 MG PO CAPS: 500.0000 mg | ORAL_CAPSULE | Freq: Three times a day (TID) | ORAL | 0 refills | Status: AC

## 2022-11-29 MED ORDER — METOPROLOL TARTRATE 5 MG/5ML IV SOLN: 5.0000 mg | INTRAVENOUS | Status: DC | PRN

## 2022-11-29 MED ORDER — ATORVASTATIN CALCIUM 20 MG PO TABS
40.0000 mg | ORAL_TABLET | Freq: Every day | ORAL | Status: DC
  Administered 2022-11-29: 40 mg via ORAL
  Filled 2022-11-29 (×2): qty 2

## 2022-11-29 MED ORDER — LOSARTAN POTASSIUM 50 MG PO TABS
50.0000 mg | ORAL_TABLET | Freq: Two times a day (BID) | ORAL | Status: DC
  Administered 2022-11-29: 50 mg via ORAL
  Filled 2022-11-29: qty 1

## 2022-11-29 NOTE — TOC Transition Note (Signed)
Transition of Care [facility]) - CM/SW Discharge Note   Patient Details  Name: Jessica Kaufman MRN: [id number] Date of Birth: 1925-10-02  Transition of Care [facility]) CM/SW Contact:  Allena Katz, LCSW Phone Number: 11/29/2022, 11:46 AM   Clinical Narrative:   Pt has orders to discharge back to [facility]. Misty Stanley contacted she reports pt can return. RN give number for report. DC summary and fl2 sent. Pt and daughter want to resume HH through [facility].     Final next level of care: Assisted Living Barriers to Discharge: Barriers Resolved   Patient Goals and CMS Choice      Discharge Placement                  Patient to be transferred to facility by: daughter Name of family member notified: daughter Patient and family notified of of transfer: 11/29/22  Discharge Plan and Services Additional resources added to the After Visit Summary for                              [facility] Agency: [facility] Date [facility] Agency Contacted: 11/29/22 Time HH Agency Contacted: 1000 Representative spoke with at [facility] Agency: Adelina Mings  Social Determinants of Health (SDOH) Interventions SDOH Screenings   Food Insecurity: No Food Insecurity (11/28/2022)  Housing: Low Risk  (11/28/2022)  Transportation Needs: No Transportation Needs (11/28/2022)  Utilities: Not At Risk (11/28/2022)  Tobacco Use: Low Risk  (11/28/2022)     Readmission Risk Interventions     No data to display

## 2022-11-29 NOTE — Plan of Care (Signed)
  Problem: Education: Goal: Knowledge of disease or condition will improve Outcome: Progressing Goal: Knowledge of secondary prevention will improve (MUST DOCUMENT ALL) Outcome: Progressing Goal: Knowledge of patient specific risk factors will improve Loraine Leriche N/A or DELETE if not current risk factor) Outcome: Progressing   Problem: Ischemic Stroke/TIA Tissue Perfusion: Goal: Complications of ischemic stroke/TIA will be minimized Outcome: Progressing   Problem: Coping: Goal: Will verbalize positive feelings about self Outcome: Progressing Goal: Will identify appropriate support needs Outcome: Progressing   Problem: Health Behavior/Discharge Planning: Goal: Ability to manage health-related needs will improve Outcome: Progressing Goal: Goals will be collaboratively established with patient/family Outcome: Progressing   Problem: Self-Care: Goal: Ability to participate in self-care as condition permits will improve Outcome: Progressing Goal: Verbalization of feelings and concerns over difficulty with self-care will improve Outcome: Progressing Goal: Ability to communicate needs accurately will improve Outcome: Progressing   Problem: Nutrition: Goal: Risk of aspiration will decrease Outcome: Progressing Goal: Dietary intake will improve Outcome: Progressing   Problem: Education: Goal: Knowledge of General Education information will improve Description: Including pain rating scale, medication(s)/side effects and non-pharmacologic comfort measures Outcome: Progressing   Problem: Education: Goal: Knowledge of General Education information will improve Description: Including pain rating scale, medication(s)/side effects and non-pharmacologic comfort measures Outcome: Progressing

## 2022-11-29 NOTE — NC FL2 (Signed)
MEDICAID FL2 LEVEL OF CARE FORM     IDENTIFICATION  Patient Name: Jessica Kaufman Birthdate: [birth year] Sex: female Admission Date (Current Location): 11/28/2022  [facility] and [place] Number:  Chiropodist and Address:  [facility], [address] [place], [place] [address]      Provider Number: [id number]  Attending Physician Name and Address:  Miguel Rota, MD  Relative Name and Phone Number:  Juliette Alcide (Daughter)  [phone]    Current Level of Care: Hospital Recommended Level of Care: Assisted Living Facility Prior Approval Number:    Date Approved/Denied:   PASRR Number:    Discharge Plan: Domiciliary (Rest home)    Current Diagnoses: Patient Active Problem List   Diagnosis Date Noted   TIA (transient ischemic attack) 11/28/2022   Hypertension    Asthma    Chronic heart failure with preserved ejection fraction (HFpEF) (HCC) 03/11/2021   Edema 11/05/2020   Atrial fibrillation with RVR (HCC) 05/07/2020   Gout    Hypothyroidism    Rheumatoid arthritis (HCC)    Syncope and collapse    Elevated troponin    Hypomagnesemia    Atypical chest pain 10/03/2019   Nonrheumatic aortic valve stenosis 10/03/2019   Palpitations 04/04/2019   Dizziness 04/04/2019   PSVT (paroxysmal supraventricular tachycardia) (HCC) 04/04/2019   Near syncope 02/20/2019   Labile hypertension 12/12/2017   Paroxysmal atrial fibrillation (HCC) 12/12/2017   Aortic valve disease 12/12/2017    Orientation RESPIRATION BLADDER Height & Weight     Self, Time, Situation, Place  Normal Incontinent Weight: 222 lb 10.6 oz (101 kg) Height:  5\' 3"  (160 cm)  BEHAVIORAL SYMPTOMS/MOOD NEUROLOGICAL BOWEL NUTRITION STATUS      Continent    AMBULATORY STATUS COMMUNICATION OF NEEDS Skin   Limited Assist Verbally Normal                       Personal Care Assistance Level of Assistance  Bathing, Feeding, Dressing Bathing Assistance: Limited  assistance Feeding assistance: Limited assistance Dressing Assistance: Limited assistance     Functional Limitations Info  Sight, Speech, Hearing Sight Info: Impaired Hearing Info: Adequate Speech Info: Adequate    SPECIAL CARE FACTORS FREQUENCY  PT (By licensed PT), OT (By licensed OT)     PT Frequency: 3 times a week OT Frequency: 3 times a week            Contractures Contractures Info: Not present    Additional Factors Info  Code Status, Allergies Code Status Info: DNR Allergies Info: Metronidazole  Sulindac  Naproxen           Current Medications (11/29/2022):  This is the current hospital active medication list Current Facility-Administered Medications  Medication Dose Route Frequency Provider Last Rate Last Admin   acetaminophen (TYLENOL) tablet 650 mg  650 mg Oral Q4H PRN Cox, Amy N, DO   650 mg at 11/28/22 2114   Or   acetaminophen (TYLENOL) 160 MG/5ML solution 650 mg  650 mg Per Tube Q4H PRN Cox, Amy N, DO       Or   acetaminophen (TYLENOL) suppository 650 mg  650 mg Rectal Q4H PRN Cox, Amy N, DO       albuterol (PROVENTIL) (2.5 MG/3ML) 0.083% nebulizer solution 2.5 mg  2.5 mg Inhalation Q6H PRN Cox, Amy N, DO       apixaban (ELIQUIS) tablet 5 mg  5 mg Oral BID Cox, Amy N, DO   5  mg at 11/29/22 1011   atorvastatin (LIPITOR) tablet 40 mg  40 mg Oral Daily Amin, Ankit C, MD   40 mg at 11/29/22 1102   carvedilol (COREG) tablet 6.25 mg  6.25 mg Oral BID Cox, Amy N, DO   6.25 mg at 11/29/22 1010   cholecalciferol (VITAMIN D3) tablet 1,000 Units  1,000 Units Oral Daily Cox, Amy N, DO   1,000 Units at 11/29/22 1010   famotidine (PEPCID) tablet 20 mg  20 mg Oral Daily Cox, Amy N, DO   20 mg at 11/29/22 1008   fluticasone furoate-vilanterol (BREO ELLIPTA) 100-25 MCG/ACT 1 puff  1 puff Inhalation Daily Cox, Amy N, DO   1 puff at 11/29/22 1009   And   umeclidinium bromide (INCRUSE ELLIPTA) 62.5 MCG/ACT 1 puff  1 puff Inhalation Daily Cox, Amy N, DO   1 puff at 11/29/22  1009   gabapentin (NEURONTIN) capsule 600 mg  600 mg Oral BID Cox, Amy N, DO   600 mg at 11/29/22 1010   guaiFENesin (MUCINEX) 12 hr tablet 600 mg  600 mg Oral BID PRN Cox, Amy N, DO       hydrALAZINE (APRESOLINE) injection 10 mg  10 mg Intravenous Q4H PRN Amin, Ankit C, MD       levothyroxine (SYNTHROID) tablet 50 mcg  50 mcg Oral Q0600 Cox, Amy N, DO   50 mcg at 11/29/22 0537   losartan (COZAAR) tablet 50 mg  50 mg Oral BID Amin, Ankit C, MD   50 mg at 11/29/22 1102   metoprolol tartrate (LOPRESSOR) injection 5 mg  5 mg Intravenous Q4H PRN Amin, Ankit C, MD       mirabegron ER (MYRBETRIQ) tablet 25 mg  25 mg Oral Daily Cox, Amy N, DO   25 mg at 11/29/22 1010   ondansetron (ZOFRAN) injection 4 mg  4 mg Intravenous Q6H PRN Amin, Ankit C, MD       senna-docusate (Senokot-S) tablet 1 tablet  1 tablet Oral QHS PRN Cox, Amy N, DO       traZODone (DESYREL) tablet 50 mg  50 mg Oral QHS PRN Amin, Ankit C, MD         Discharge Medications: Please see discharge summary for a list of discharge medications.   STOP taking these medications     CRANBERRY PO    folic acid 1 MG tablet Commonly known as: FOLVITE    hydrochlorothiazide 12.5 MG tablet Commonly known as: HYDRODIURIL    methotrexate 2.5 MG tablet Commonly known as: RHEUMATREX           TAKE these medications     acetaminophen 650 MG CR tablet Commonly known as: TYLENOL Take 650 mg by mouth every 8 (eight) hours as needed for pain.    apixaban 5 MG Tabs tablet Commonly known as: ELIQUIS Take 5 mg by mouth 2 (two) times daily.    atorvastatin 40 MG tablet Commonly known as: LIPITOR Take 1 tablet (40 mg total) by mouth daily. Start taking on: November 30, 2022    b complex vitamins tablet Take 1 tablet by mouth daily.    Bioflex Tabs Take 1 tablet by mouth in the morning and at bedtime.    carvedilol 6.25 MG tablet Commonly known as: COREG Take 1 tablet (6.25 mg total) by mouth 2 (two) times daily.    PRESERVISION  AREDS 2 PO Take 1 capsule by mouth 2 (two) times daily.    CENTRUM SILVER PO Take 1  tablet by mouth daily.    cephALEXin 500 MG capsule Commonly known as: KEFLEX Take 1 capsule (500 mg total) by mouth 3 (three) times daily for 5 days.    CoQ10 100 MG Caps Take 1 capsule by mouth daily.    famotidine 20 MG tablet Commonly known as: PEPCID Take 20 mg by mouth daily.    fexofenadine 180 MG tablet Commonly known as: ALLEGRA Take 180 mg by mouth as needed.    Fish Oil 1000 MG Caps Take 1 capsule by mouth in the morning and at bedtime.    furosemide 40 MG tablet Commonly known as: LASIX Take Furosemide 40 mg one tablet every am & one-half (20 mg) tablet at noon. What changed:  how much to take how to take this when to take this additional instructions    gabapentin 600 MG tablet Commonly known as: NEURONTIN Take 600 mg by mouth 2 (two) times daily.    GINGER ROOT PO Take by mouth daily.    guaiFENesin 600 MG 12 hr tablet Commonly known as: MUCINEX Take 600 mg by mouth daily.    hydrALAZINE 25 MG tablet Commonly known as: APRESOLINE TAKE 1 TABLET (25 MG)  BY MOUTH UP TO THREE TIMES A DAY AS NEEDED FOR SYSTOLIC BP OF 170 OR GREATER What changed:  how much to take how to take this when to take this additional instructions    levothyroxine 50 MCG tablet Commonly known as: SYNTHROID Take 1 tablet by mouth daily.    losartan 50 MG tablet Commonly known as: COZAAR Take 1 tablet (50 mg total) by mouth 2 (two) times daily.    Myrbetriq 25 MG Tb24 tablet Generic drug: mirabegron ER Take 25 mg by mouth daily.    Premarin vaginal cream Generic drug: conjugated estrogens Place 1 Applicatorful vaginally in the morning and at bedtime.    ProAir RespiClick 108 (90 Base) MCG/ACT Aepb Generic drug: Albuterol Sulfate Inhale 2 puffs into the lungs every 6 (six) hours as needed.    psyllium 58.6 % powder Commonly known as: METAMUCIL Take 1 packet by mouth daily.     Trelegy Ellipta 100-62.5-25 MCG/ACT Aepb Generic drug: Fluticasone-Umeclidin-Vilant Inhale 1 puff into the lungs daily.    TURMERIC PO Take by mouth daily.    Vitamin D3 25 MCG (1000 UT) Caps Take 1 capsule by mouth daily.     Relevant Imaging Results:  Relevant Lab Results:   Additional Information SS [id number]  Allena Katz, LCSW

## 2022-11-29 NOTE — Evaluation (Signed)
Occupational Therapy Evaluation Patient Details Name: Jessica Kaufman MRN: [id number] DOB: 07-23-1925 Today's Date: 11/29/2022   History of Present Illness Jessica Kaufman is a 89 year old female with history of hypothyroid, labile hypertension, history of paroxysmal atrial fibrillation, history of CVA noted on CT imaging, mild to moderate aortic stenosis, history of syncope, history of mechanical falls, rheumatoid arthritis, moderate persistent asthmatic bronchitis, hyperlipidemia, history of hypertensive heart disease without heart failure, premature ventricular contraction, history of gout, who presents to the emergency department from [facility] nursing facility for chief concerns of right-sided weakness and slurred speech lasting approximately 15 minutes   Clinical Impression   Jessica Kaufman was seen for OT evaluation this date. Prior to hospital admission, pt was MO DI using rollator. Pt lives at [facility]. Pt currently requires SUPERVISION + RW for toilet t/f, stnading grooming, and clothing mgmt. Initial MIN A standing from bed, improves to CGA from chair. Pt and daughter report near baseline, no acute needs, will sign off. Upon hospital discharge, recommend no OT follow up.     If plan is discharge home, recommend the following: A little help with bathing/dressing/bathroom    Functional Status Assessment  Patient has had a recent decline in their functional status and demonstrates the ability to make significant improvements in function in a reasonable and predictable amount of time.  Equipment Recommendations  None recommended by OT    Recommendations for Other Services       Precautions / Restrictions Precautions Precautions: Fall Restrictions Weight Bearing Restrictions: No      Mobility Bed Mobility               General bed mobility comments: received and left sitting    Transfers Overall transfer level: Needs assistance Equipment used: Rolling  walker (2 wheels) Transfers: Sit to/from Stand Sit to Stand: Contact guard assist           General transfer comment: MIN A to stand from low bed height (sleeps in lift chair), CGA standing from chair      Balance Overall balance assessment: Needs assistance Sitting-balance support: No upper extremity supported, Feet supported Sitting balance-Leahy Scale: Good     Standing balance support: No upper extremity supported, During functional activity Standing balance-Leahy Scale: Fair                             ADL either performed or assessed with clinical judgement   ADL Overall ADL's : Needs assistance/impaired                                       General ADL Comments: SUPERVISION + RW for toilet t/f, stnading grooming, and clothing mgmt.                  Pertinent Vitals/Pain Pain Assessment Pain Assessment: No/denies pain     Extremity/Trunk Assessment Upper Extremity Assessment Upper Extremity Assessment: Overall WFL for tasks assessed   Lower Extremity Assessment Lower Extremity Assessment: Generalized weakness       Communication Communication Communication: Hearing impairment   Cognition Arousal: Alert Behavior During Therapy: WFL for tasks assessed/performed Overall Cognitive Status: Within Functional Limits for tasks assessed  Home Living Family/patient expects to be discharged to:: Assisted living                             Home Equipment: Rolling Walker (2 wheels);Rollator (4 wheels)          Prior Functioning/Environment Prior Level of Function : Independent/Modified Independent             Mobility Comments: rollator use, sleps in lift chair ADLs Comments: assist for bathing        OT Problem List: Decreased activity tolerance         OT Goals(Current goals can be found in the care plan section) Acute Rehab OT  Goals Patient Stated Goal: to go home OT Goal Formulation: With patient/family Time For Goal Achievement: 12/13/22 Potential to Achieve Goals: Good   AM-PAC OT "6 Clicks" Daily Activity     Outcome Measure Help from another person eating meals?: None Help from another person taking care of personal grooming?: None Help from another person toileting, which includes using toliet, bedpan, or urinal?: None Help from another person bathing (including washing, rinsing, drying)?: A Little Help from another person to put on and taking off regular upper body clothing?: None Help from another person to put on and taking off regular lower body clothing?: A Little 6 Click Score: 22   End of Session Equipment Utilized During Treatment: Rolling walker (2 wheels)  Activity Tolerance: Patient tolerated treatment well Patient left: in chair;with call bell/phone within reach;with nursing/sitter in room;with family/visitor present  OT Visit Diagnosis: Other abnormalities of gait and mobility (R26.89)                Time: 1191-4782 OT Time Calculation (min): 14 min Charges:  OT General Charges $OT Visit: 1 Visit OT Evaluation $OT Eval Low Complexity: 1 Low  Kathie Dike, M.S. OTR/L  11/29/22, 11:41 AM  ascom [phone]

## 2022-11-29 NOTE — Progress Notes (Signed)
PT Cancellation Note  Patient Details Name: Jessica Kaufman MRN: [id number] DOB: Jul 23, 1925   Cancelled Treatment:    Reason Eval/Treat Not Completed: Other (comment).  PT consult received.  Chart reviewed.  Attempted to see pt at 0850 for PT evaluation.  Pt's BP noted to be elevated to 197/85 resting in bed (pt reports it is typically elevated before she gets her BP medication in the morning)--nurse notified immediately regarding pt's elevated BP and pt's request for BP medication.  OT saw pt later in morning and reporting that pt was close to functional baseline (per pt and pt's daughter) and plan for continuing HHPT upon discharge (TOC aware).  No acute PT needs identified (plan for HHPT upon discharge).  Hendricks Limes, PT 11/29/22, 11:43 AM

## 2022-11-29 NOTE — Discharge Summary (Signed)
Physician Discharge Summary  Jessica Kaufman JXB:[id number] DOB: [birth year] DOA: 11/28/2022  PCP: Ailene Ravel, MD  Admit date: 11/28/2022 Discharge date: 11/29/2022  Admitted From: ALF Disposition:  ALF  Recommendations for Outpatient Follow-up:  Follow up with PCP in 1-2 weeks Please obtain BMP/CBC in one week your next doctors visit.  Lipitor prescribed Keflex PO for 5 days   Discharge Condition: Stable CODE STATUS: DNR Diet recommendation: low Na  Brief/Interim Summary:  Brief Narrative:  89 year old female with history of hypothyroid, labile hypertension, history of paroxysmal atrial fibrillation, history of CVA noted on CT imaging, mild to moderate aortic stenosis, history of syncope, history of mechanical falls, rheumatoid arthritis, moderate persistent asthmatic bronchitis, hyperlipidemia, history of hypertensive heart disease without heart failure, premature ventricular contraction, history of gout, who presents to the emergency department from [facility] nursing facility for chief concerns of right-sided weakness and slurred speech lasting approximately 15 minutes.  CT of the head is negative. MRI is neg.  Wants go home today as she is feeling better.    Assessment & Plan:  Principal Problem:   TIA (transient ischemic attack) Active Problems:   Chronic heart failure with preserved ejection fraction (HFpEF) (HCC)   Hypertension   Hypothyroidism   Labile hypertension   Paroxysmal atrial fibrillation (HCC)   Aortic valve disease   Nonrheumatic aortic valve stenosis   Rheumatoid arthritis (HCC)    Right-sided weakness and slurred speech, TIA Suspect TIA type symptoms.  CT head and MRI brain are negative.  Patient is already on Eliquis A1c pending, LDL 123 Start Lipitor (tells me she wants to try it again, in the past has had myalgias).   Urinary tract infection - has increase in freq and urgency, Will prescribe 5 days of keflex.    Chronic heart failure  with preserved ejection fraction (HFpEF) (HCC) Echocardiogram in May 2024 showed EF of 65%, grade 2 DD.  No need to repeat   Hypothyroidism Home levothyroxine 50 mcg daily resumed   Rheumatoid arthritis (HCC) Home methotrexate 12.5 mg p.o. weekly resumed for 11/29/2022   Paroxysmal atrial fibrillation (HCC) Home Eliquis 5 mg p.o. twice daily, Coreg 6.25 mg p.o. twice daily were resumed on admission   Labile hypertension Continue home Coreg.  Eventually will resume losartan.   DVT prophylaxis: apixaban (ELIQUIS) tablet 5 mg  Code Status: DNR Family Communication:  Daughter at bedside DC today    Subjective: Doing better no complaints.    Examination:  General exam: Appears calm and comfortable  Respiratory system: Clear to auscultation. Respiratory effort normal. Cardiovascular system: S1 & S2 heard, RRR. No JVD, murmurs, rubs, gallops or clicks. No pedal edema. Gastrointestinal system: Abdomen is nondistended, soft and nontender. No organomegaly or masses felt. Normal bowel sounds heard. Central nervous system: Alert and oriented. No focal neurological deficits. Extremities: Symmetric 5 x 5 power. Skin: No rashes, lesions or ulcers Psychiatry: Judgement and insight appear normal. Mood & affect appropriate.    Discharge Diagnoses:  Principal Problem:   TIA (transient ischemic attack) Active Problems:   Chronic heart failure with preserved ejection fraction (HFpEF) (HCC)   Hypertension   Hypothyroidism   Labile hypertension   Paroxysmal atrial fibrillation (HCC)   Aortic valve disease   Nonrheumatic aortic valve stenosis   Rheumatoid arthritis (HCC)     Discharge Exam: Vitals:   11/29/22 0415 11/29/22 0838  BP: (!) 161/64 106/87  Pulse: 61 (!) 59  Resp: 18 15  Temp: 97.8 F (36.6 C) 97.9 F (36.6  C)  SpO2: 94% 95%   Vitals:   11/28/22 1629 11/28/22 1652 11/29/22 0415 11/29/22 0838  BP: (!) 162/72 (!) 187/73 (!) 161/64 106/87  Pulse: 65 63 61 (!) 59   Resp: 15 16 18 15   Temp: 98 F (36.7 C) 98.7 F (37.1 C) 97.8 F (36.6 C) 97.9 F (36.6 C)  TempSrc:      SpO2: 97% 96% 94% 95%  Weight:      Height:         Discharge Instructions   Allergies as of 11/29/2022       Reactions   Metronidazole Other (See Comments)   RASH   Sulindac Other (See Comments)   UNKNOWN   Naproxen Rash        Medication List     STOP taking these medications    CRANBERRY PO   folic acid 1 MG tablet Commonly known as: FOLVITE   hydrochlorothiazide 12.5 MG tablet Commonly known as: HYDRODIURIL   methotrexate 2.5 MG tablet Commonly known as: RHEUMATREX       TAKE these medications    acetaminophen 650 MG CR tablet Commonly known as: TYLENOL Take 650 mg by mouth every 8 (eight) hours as needed for pain.   apixaban 5 MG Tabs tablet Commonly known as: ELIQUIS Take 5 mg by mouth 2 (two) times daily.   atorvastatin 40 MG tablet Commonly known as: LIPITOR Take 1 tablet (40 mg total) by mouth daily. Start taking on: November 30, 2022   b complex vitamins tablet Take 1 tablet by mouth daily.   Bioflex Tabs Take 1 tablet by mouth in the morning and at bedtime.   carvedilol 6.25 MG tablet Commonly known as: COREG Take 1 tablet (6.25 mg total) by mouth 2 (two) times daily.   PRESERVISION AREDS 2 PO Take 1 capsule by mouth 2 (two) times daily.   CENTRUM SILVER PO Take 1 tablet by mouth daily.   cephALEXin 500 MG capsule Commonly known as: KEFLEX Take 1 capsule (500 mg total) by mouth 3 (three) times daily for 5 days.   CoQ10 100 MG Caps Take 1 capsule by mouth daily.   famotidine 20 MG tablet Commonly known as: PEPCID Take 20 mg by mouth daily.   fexofenadine 180 MG tablet Commonly known as: ALLEGRA Take 180 mg by mouth as needed.   Fish Oil 1000 MG Caps Take 1 capsule by mouth in the morning and at bedtime.   furosemide 40 MG tablet Commonly known as: LASIX Take Furosemide 40 mg one tablet every am &  one-half (20 mg) tablet at noon. What changed:  how much to take how to take this when to take this additional instructions   gabapentin 600 MG tablet Commonly known as: NEURONTIN Take 600 mg by mouth 2 (two) times daily.   GINGER ROOT PO Take by mouth daily.   guaiFENesin 600 MG 12 hr tablet Commonly known as: MUCINEX Take 600 mg by mouth daily.   hydrALAZINE 25 MG tablet Commonly known as: APRESOLINE TAKE 1 TABLET (25 MG)  BY MOUTH UP TO THREE TIMES A DAY AS NEEDED FOR SYSTOLIC BP OF 170 OR GREATER What changed:  how much to take how to take this when to take this additional instructions   levothyroxine 50 MCG tablet Commonly known as: SYNTHROID Take 1 tablet by mouth daily.   losartan 50 MG tablet Commonly known as: COZAAR Take 1 tablet (50 mg total) by mouth 2 (two) times daily.   Myrbetriq 25  MG Tb24 tablet Generic drug: mirabegron ER Take 25 mg by mouth daily.   Premarin vaginal cream Generic drug: conjugated estrogens Place 1 Applicatorful vaginally in the morning and at bedtime.   ProAir RespiClick 108 (90 Base) MCG/ACT Aepb Generic drug: Albuterol Sulfate Inhale 2 puffs into the lungs every 6 (six) hours as needed.   psyllium 58.6 % powder Commonly known as: METAMUCIL Take 1 packet by mouth daily.   Trelegy Ellipta 100-62.5-25 MCG/ACT Aepb Generic drug: Fluticasone-Umeclidin-Vilant Inhale 1 puff into the lungs daily.   TURMERIC PO Take by mouth daily.   Vitamin D3 25 MCG (1000 UT) Caps Take 1 capsule by mouth daily.        Allergies  Allergen Reactions   Metronidazole Other (See Comments)    RASH   Sulindac Other (See Comments)    UNKNOWN   Naproxen Rash    You were cared for by a hospitalist during your hospital stay. If you have any questions about your discharge medications or the care you received while you were in the hospital after you are discharged, you can call the unit and asked to speak with the hospitalist on call if the  hospitalist that took care of you is not available. Once you are discharged, your primary care physician will handle any further medical issues. Please note that no refills for any discharge medications will be authorized once you are discharged, as it is imperative that you return to your primary care physician (or establish a relationship with a primary care physician if you do not have one) for your aftercare needs so that they can reassess your need for medications and monitor your lab values.  You were cared for by a hospitalist during your hospital stay. If you have any questions about your discharge medications or the care you received while you were in the hospital after you are discharged, you can call the unit and asked to speak with the hospitalist on call if the hospitalist that took care of you is not available. Once you are discharged, your primary care physician will handle any further medical issues. Please note that NO REFILLS for any discharge medications will be authorized once you are discharged, as it is imperative that you return to your primary care physician (or establish a relationship with a primary care physician if you do not have one) for your aftercare needs so that they can reassess your need for medications and monitor your lab values.  Please request your Prim.MD to go over all Hospital Tests and Procedure/Radiological results at the follow up, please get all Hospital records sent to your Prim MD by signing hospital release before you go home.  Get CBC, CMP, 2 view Chest X ray checked  by Primary MD during your next visit or SNF MD in 5-7 days ( we routinely change or add medications that can affect your baseline labs and fluid status, therefore we recommend that you get the mentioned basic workup next visit with your PCP, your PCP may decide not to get them or add new tests based on their clinical decision)  On your next visit with your primary care physician please Get  Medicines reviewed and adjusted.  If you experience worsening of your admission symptoms, develop shortness of breath, life threatening emergency, suicidal or homicidal thoughts you must seek medical attention immediately by calling 911 or calling your MD immediately  if symptoms less severe.  You Must read complete instructions/literature along with all the possible adverse reactions/side  effects for all the Medicines you take and that have been prescribed to you. Take any new Medicines after you have completely understood and accpet all the possible adverse reactions/side effects.   Do not drive, operate heavy machinery, perform activities at heights, swimming or participation in water activities or provide baby sitting services if your were admitted for syncope or siezures until you have seen by Primary MD or a Neurologist and advised to do so again.  Do not drive when taking Pain medications.   Procedures/Studies: MR BRAIN WO CONTRAST  Result Date: 11/29/2022 CLINICAL DATA:  Initial evaluation for mental status change, unknown cause. EXAM: MRI HEAD WITHOUT CONTRAST TECHNIQUE: Multiplanar, multiecho pulse sequences of the brain and surrounding structures were obtained without intravenous contrast. COMPARISON:  CT from earlier the same day. FINDINGS: Brain: Generalized age-related cerebral atrophy. Patchy T2/FLAIR hyperintensity involving the periventricular deep white matter both cerebral hemispheres, consistent with chronic small vessel ischemic disease, moderately advanced. Few scattered remote lacunar infarcts present about the bilateral basal ganglia. No evidence for acute or subacute ischemia. Gray-white matter differentiation maintained. No acute or chronic intracranial blood products. No mass lesion, midline shift or mass effect. No hydrocephalus or extra-axial fluid collection. Pituitary gland and suprasellar region within normal limits. Vascular: Major intracranial vascular flow voids are  maintained. Skull and upper cervical spine: Degenerative osteoarthritic changes noted about the atlantodental articulation. Craniocervical junction otherwise unremarkable. Bone marrow signal intensity within normal limits. No scalp soft tissue abnormality. Sinuses/Orbits: Prior bilateral ocular lens replacement. Paranasal sinuses are largely clear. No significant mastoid effusion. Other: None. IMPRESSION: 1. No acute intracranial abnormality. 2. Age-related cerebral atrophy with moderate chronic microvascular ischemic disease, with a few scattered remote lacunar infarcts about the bilateral basal ganglia. Electronically Signed   By: Rise Mu M.D.   On: 11/29/2022 06:28   CT Head Wo Contrast  Result Date: 11/28/2022 CLINICAL DATA:  TIA.  Slurred speech.  RIGHT-sided weakness EXAM: CT HEAD WITHOUT CONTRAST TECHNIQUE: Contiguous axial images were obtained from the base of the skull through the vertex without intravenous contrast. RADIATION DOSE REDUCTION: This exam was performed according to the departmental dose-optimization program which includes automated exposure control, adjustment of the mA and/or kV according to patient size and/or use of iterative reconstruction technique. COMPARISON:  None Available. FINDINGS: Brain: No acute intracranial hemorrhage. No focal mass lesion. No CT evidence of acute infarction. No midline shift or mass effect. No hydrocephalus. Basilar cisterns are patent. There are periventricular and subcortical white matter hypodensities. Generalized cortical atrophy. Remote small deep white matter infarction in the RIGHT corona radiata unchanged from prior. Vascular: No hyperdense vessel or unexpected calcification. Skull: Normal. Negative for fracture or focal lesion. Sinuses/Orbits: Paranasal sinuses and mastoid air cells are clear. Orbits are clear. Other: None. IMPRESSION: 1. No acute intracranial findings. 2. Chronic microvascular ischemia and cortical atrophy.  Electronically Signed   By: Genevive Bi M.D.   On: 11/28/2022 11:30     The results of significant diagnostics from this hospitalization (including imaging, microbiology, ancillary and laboratory) are listed below for reference.     Microbiology: No results found for this or any previous visit (from the past 240 hour(s)).   Labs: BNP (last 3 results) No results for input(s): "BNP" in the last 8760 hours. Basic Metabolic Panel: Recent Labs  Lab 11/28/22 1050  NA 132*  K 4.6  CL 101  CO2 20*  GLUCOSE 79  BUN 16  CREATININE 0.64  CALCIUM 8.5*   Liver Function Tests:  Recent Labs  Lab 11/28/22 1050  AST 34  ALT 16  ALKPHOS 75  BILITOT 1.2*  PROT 6.2*  ALBUMIN 3.4*   No results for input(s): "LIPASE", "AMYLASE" in the last 168 hours. No results for input(s): "AMMONIA" in the last 168 hours. CBC: Recent Labs  Lab 11/28/22 1050  WBC 8.0  NEUTROABS 4.6  HGB 11.9*  HCT 36.7  MCV 95.1  PLT 294   Cardiac Enzymes: No results for input(s): "CKTOTAL", "CKMB", "CKMBINDEX", "TROPONINI" in the last 168 hours. BNP: Invalid input(s): "POCBNP" CBG: No results for input(s): "GLUCAP" in the last 168 hours. D-Dimer No results for input(s): "DDIMER" in the last 72 hours. Hgb A1c Recent Labs    11/29/22 0453  HGBA1C 5.4   Lipid Profile Recent Labs    11/29/22 0453  CHOL 192  HDL 48  LDLCALC 123*  TRIG 105  CHOLHDL 4.0   Thyroid function studies No results for input(s): "TSH", "T4TOTAL", "T3FREE", "THYROIDAB" in the last 72 hours.  Invalid input(s): "FREET3" Anemia work up No results for input(s): "VITAMINB12", "FOLATE", "FERRITIN", "TIBC", "IRON", "RETICCTPCT" in the last 72 hours. Urinalysis    Component Value Date/Time   COLORURINE YELLOW (A) 11/28/2022 1330   APPEARANCEUR CLOUDY (A) 11/28/2022 1330   LABSPEC 1.005 11/28/2022 1330   PHURINE 7.0 11/28/2022 1330   GLUCOSEU NEGATIVE 11/28/2022 1330   HGBUR NEGATIVE 11/28/2022 1330   BILIRUBINUR  NEGATIVE 11/28/2022 1330   KETONESUR NEGATIVE 11/28/2022 1330   PROTEINUR NEGATIVE 11/28/2022 1330   UROBILINOGEN 1.0 05/20/2009 1356   NITRITE POSITIVE (A) 11/28/2022 1330   LEUKOCYTESUR LARGE (A) 11/28/2022 1330   Sepsis Labs Recent Labs  Lab 11/28/22 1050  WBC 8.0   Microbiology No results found for this or any previous visit (from the past 240 hour(s)).   Time coordinating discharge:  I have spent 35 minutes face to face with the patient and on the ward discussing the patients care, assessment, plan and disposition with other care givers. >50% of the time was devoted counseling the patient about the risks and benefits of treatment/Discharge disposition and coordinating care.   SIGNED:   Miguel Rota, MD  [facility] 11/29/2022, 11:41 AM   If 7PM-7AM, please contact night-coverage

## 2022-11-29 NOTE — Plan of Care (Signed)
  Problem: Education: Goal: Knowledge of disease or condition will improve 11/29/2022 0623 by Dennie Maizes, LPN Outcome: Progressing 11/29/2022 0437 by Dennie Maizes, LPN Outcome: Progressing Goal: Knowledge of secondary prevention will improve (MUST DOCUMENT ALL) 11/29/2022 0623 by Dennie Maizes, LPN Outcome: Progressing 11/29/2022 0437 by Dennie Maizes, LPN Outcome: Progressing Goal: Knowledge of patient specific risk factors will improve Loraine Leriche N/A or DELETE if not current risk factor) 11/29/2022 0623 by Dennie Maizes, LPN Outcome: Progressing 11/29/2022 0437 by Dennie Maizes, LPN Outcome: Progressing   Problem: Ischemic Stroke/TIA Tissue Perfusion: Goal: Complications of ischemic stroke/TIA will be minimized 11/29/2022 0623 by Dennie Maizes, LPN Outcome: Progressing 11/29/2022 0437 by Dennie Maizes, LPN Outcome: Progressing   Problem: Coping: Goal: Will verbalize positive feelings about self 11/29/2022 0623 by Dennie Maizes, LPN Outcome: Progressing 11/29/2022 0437 by Dennie Maizes, LPN Outcome: Progressing Goal: Will identify appropriate support needs 11/29/2022 0623 by Dennie Maizes, LPN Outcome: Progressing 11/29/2022 0437 by Dennie Maizes, LPN Outcome: Progressing   Problem: Health Behavior/Discharge Planning: Goal: Ability to manage health-related needs will improve 11/29/2022 0623 by Dennie Maizes, LPN Outcome: Progressing 11/29/2022 0437 by Dennie Maizes, LPN Outcome: Progressing Goal: Goals will be collaboratively established with patient/family 11/29/2022 [phone] by Dennie Maizes, LPN Outcome: Progressing 11/29/2022 0437 by Dennie Maizes, LPN Outcome: Progressing   Problem: Self-Care: Goal: Ability to participate in self-care as condition permits will improve 11/29/2022 0623 by Dennie Maizes, LPN Outcome: Progressing 11/29/2022 0437 by Dennie Maizes, LPN Outcome: Progressing Goal: Verbalization of feelings and concerns over difficulty with self-care will improve 11/29/2022 0623 by Dennie Maizes, LPN Outcome: Progressing 11/29/2022 0437 by Dennie Maizes, LPN Outcome: Progressing Goal: Ability to communicate needs accurately will improve 11/29/2022 0623 by Dennie Maizes, LPN Outcome: Progressing 11/29/2022 0437 by Dennie Maizes, LPN Outcome: Progressing   Problem: Nutrition: Goal: Risk of aspiration will decrease 11/29/2022 0623 by Dennie Maizes, LPN Outcome: Progressing 11/29/2022 0437 by Dennie Maizes, LPN Outcome: Progressing Goal: Dietary intake will improve 11/29/2022 0623 by Dennie Maizes, LPN Outcome: Progressing 11/29/2022 0437 by Dennie Maizes, LPN Outcome: Progressing   Problem: Education: Goal: Knowledge of General Education information will improve Description: Including pain rating scale, medication(s)/side effects and non-pharmacologic comfort measures 11/29/2022 0623 by Dennie Maizes, LPN Outcome: Progressing 11/29/2022 0437 by Dennie Maizes, LPN Outcome: Progressing   Problem: Education: Goal: Knowledge of General Education information will improve Description: Including pain rating scale, medication(s)/side effects and non-pharmacologic comfort measures 11/29/2022 0623 by Dennie Maizes, LPN Outcome: Progressing 11/29/2022 0437 by Dennie Maizes, LPN Outcome: Progressing

## 2022-11-29 NOTE — Progress Notes (Signed)
Patient being discharged back to [facility]. Called report to [place] and spoke to [employer]. All discharge paperwork is in discharge packet. Patient will be transported via daughter.

## 2022-12-03 DIAGNOSIS — E039 Hypothyroidism, unspecified: Secondary | ICD-10-CM | POA: Diagnosis not present

## 2022-12-03 DIAGNOSIS — Z79899 Other long term (current) drug therapy: Secondary | ICD-10-CM | POA: Diagnosis not present

## 2022-12-03 DIAGNOSIS — I110 Hypertensive heart disease with heart failure: Secondary | ICD-10-CM | POA: Diagnosis not present

## 2022-12-03 DIAGNOSIS — Z8673 Personal history of transient ischemic attack (TIA), and cerebral infarction without residual deficits: Secondary | ICD-10-CM | POA: Diagnosis not present

## 2022-12-03 DIAGNOSIS — Z7951 Long term (current) use of inhaled steroids: Secondary | ICD-10-CM | POA: Diagnosis not present

## 2022-12-03 DIAGNOSIS — Z9181 History of falling: Secondary | ICD-10-CM | POA: Diagnosis not present

## 2022-12-03 DIAGNOSIS — I5032 Chronic diastolic (congestive) heart failure: Secondary | ICD-10-CM | POA: Diagnosis not present

## 2022-12-03 DIAGNOSIS — M069 Rheumatoid arthritis, unspecified: Secondary | ICD-10-CM | POA: Diagnosis not present

## 2022-12-03 DIAGNOSIS — I480 Paroxysmal atrial fibrillation: Secondary | ICD-10-CM | POA: Diagnosis not present

## 2022-12-03 DIAGNOSIS — J45909 Unspecified asthma, uncomplicated: Secondary | ICD-10-CM | POA: Diagnosis not present

## 2022-12-03 DIAGNOSIS — Z7901 Long term (current) use of anticoagulants: Secondary | ICD-10-CM | POA: Diagnosis not present

## 2022-12-03 DIAGNOSIS — H3530 Unspecified macular degeneration: Secondary | ICD-10-CM | POA: Diagnosis not present

## 2022-12-03 DIAGNOSIS — M109 Gout, unspecified: Secondary | ICD-10-CM | POA: Diagnosis not present

## 2022-12-03 DIAGNOSIS — H9193 Unspecified hearing loss, bilateral: Secondary | ICD-10-CM | POA: Diagnosis not present

## 2022-12-03 DIAGNOSIS — I350 Nonrheumatic aortic (valve) stenosis: Secondary | ICD-10-CM | POA: Diagnosis not present

## 2022-12-03 DIAGNOSIS — N390 Urinary tract infection, site not specified: Secondary | ICD-10-CM | POA: Diagnosis not present

## 2022-12-06 DIAGNOSIS — C44612 Basal cell carcinoma of skin of right upper limb, including shoulder: Secondary | ICD-10-CM | POA: Diagnosis not present

## 2022-12-08 DIAGNOSIS — E871 Hypo-osmolality and hyponatremia: Secondary | ICD-10-CM | POA: Diagnosis not present

## 2022-12-08 DIAGNOSIS — Z87440 Personal history of urinary (tract) infections: Secondary | ICD-10-CM | POA: Diagnosis not present

## 2022-12-08 DIAGNOSIS — Z8673 Personal history of transient ischemic attack (TIA), and cerebral infarction without residual deficits: Secondary | ICD-10-CM | POA: Diagnosis not present

## 2022-12-15 DIAGNOSIS — Z515 Encounter for palliative care: Secondary | ICD-10-CM | POA: Diagnosis not present

## 2022-12-15 DIAGNOSIS — G4733 Obstructive sleep apnea (adult) (pediatric): Secondary | ICD-10-CM | POA: Diagnosis not present

## 2022-12-15 DIAGNOSIS — I480 Paroxysmal atrial fibrillation: Secondary | ICD-10-CM | POA: Diagnosis not present

## 2022-12-15 DIAGNOSIS — I10 Essential (primary) hypertension: Secondary | ICD-10-CM | POA: Diagnosis not present

## 2022-12-21 DIAGNOSIS — H353211 Exudative age-related macular degeneration, right eye, with active choroidal neovascularization: Secondary | ICD-10-CM | POA: Diagnosis not present

## 2023-01-05 ENCOUNTER — Other Ambulatory Visit: Payer: Self-pay

## 2023-01-05 MED ORDER — HYDRALAZINE HCL 25 MG PO TABS: ORAL_TABLET | ORAL | 3 refills | Status: DC

## 2023-01-17 DIAGNOSIS — G4733 Obstructive sleep apnea (adult) (pediatric): Secondary | ICD-10-CM | POA: Diagnosis not present

## 2023-01-26 DIAGNOSIS — B351 Tinea unguium: Secondary | ICD-10-CM | POA: Diagnosis not present

## 2023-01-26 DIAGNOSIS — I7091 Generalized atherosclerosis: Secondary | ICD-10-CM | POA: Diagnosis not present

## 2023-01-30 ENCOUNTER — Other Ambulatory Visit: Payer: Self-pay

## 2023-01-30 MED ORDER — FUROSEMIDE 40 MG PO TABS: ORAL_TABLET | ORAL | 0 refills | Status: DC

## 2023-01-30 NOTE — Telephone Encounter (Signed)
Requested Prescriptions   Signed Prescriptions Disp Refills   furosemide (LASIX) 40 MG tablet 135 tablet 0    Sig: Take Furosemide 40 mg one tablet every am & one-half (20 mg) tablet at noon./PLEASE CALL OFFICE TO SCHEDULE APPOINTMENT PRIOR TO NEXT REFILL    Authorizing Provider: END, CHRISTOPHER    Ordering User: Guerry Minors

## 2023-01-30 NOTE — Telephone Encounter (Signed)
Last office visit: 04/26/22 with plan to f/u 6 months. next office visit: none/active recall   Please schedule f/u appt.  Thanks!

## 2023-01-31 NOTE — Telephone Encounter (Signed)
Patient was seen on 11/21/22 with plan to f/u in 6 months.

## 2023-02-15 DIAGNOSIS — H353211 Exudative age-related macular degeneration, right eye, with active choroidal neovascularization: Secondary | ICD-10-CM | POA: Diagnosis not present

## 2023-03-15 ENCOUNTER — Other Ambulatory Visit: Payer: Self-pay

## 2023-03-15 MED ORDER — CARVEDILOL 6.25 MG PO TABS: 6.2500 mg | ORAL_TABLET | Freq: Two times a day (BID) | ORAL | 3 refills | Status: DC

## 2023-03-21 DIAGNOSIS — E039 Hypothyroidism, unspecified: Secondary | ICD-10-CM | POA: Diagnosis not present

## 2023-03-21 DIAGNOSIS — I471 Supraventricular tachycardia, unspecified: Secondary | ICD-10-CM | POA: Diagnosis not present

## 2023-03-21 DIAGNOSIS — I5032 Chronic diastolic (congestive) heart failure: Secondary | ICD-10-CM | POA: Diagnosis not present

## 2023-03-21 DIAGNOSIS — Z23 Encounter for immunization: Secondary | ICD-10-CM | POA: Diagnosis not present

## 2023-03-21 DIAGNOSIS — M109 Gout, unspecified: Secondary | ICD-10-CM | POA: Diagnosis not present

## 2023-03-21 DIAGNOSIS — R202 Paresthesia of skin: Secondary | ICD-10-CM | POA: Diagnosis not present

## 2023-03-21 DIAGNOSIS — Z79899 Other long term (current) drug therapy: Secondary | ICD-10-CM | POA: Diagnosis not present

## 2023-03-21 DIAGNOSIS — I480 Paroxysmal atrial fibrillation: Secondary | ICD-10-CM | POA: Diagnosis not present

## 2023-03-21 DIAGNOSIS — R32 Unspecified urinary incontinence: Secondary | ICD-10-CM | POA: Diagnosis not present

## 2023-03-21 DIAGNOSIS — M069 Rheumatoid arthritis, unspecified: Secondary | ICD-10-CM | POA: Diagnosis not present

## 2023-04-06 IMAGING — CT CT HEAD W/O CM
3 series · 16 of 46 positions shown, 19 images · non-contrast
Comparison: 01/09/2020

CLINICAL DATA: Syncope.

EXAM:
CT HEAD WITHOUT CONTRAST
TECHNIQUE: Contiguous axial images were obtained from the base of the skull
through the vertex without intravenous contrast.

[Series 2: head wo · axial · 0.41mm/px · z∈[+328,+448]mm · 10 of 29 slices shown, 13 images]
[im 3/29  brain]
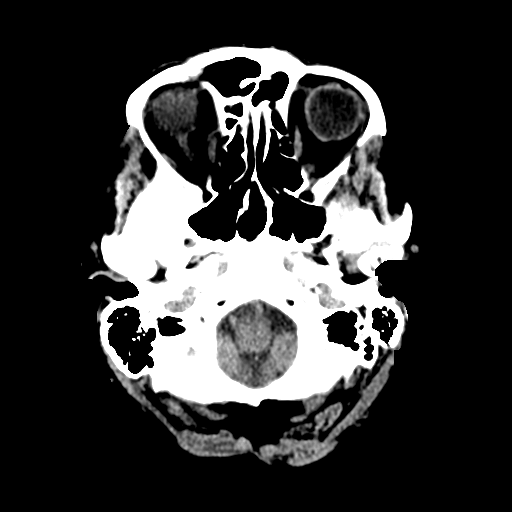
[im 3/29  bone]
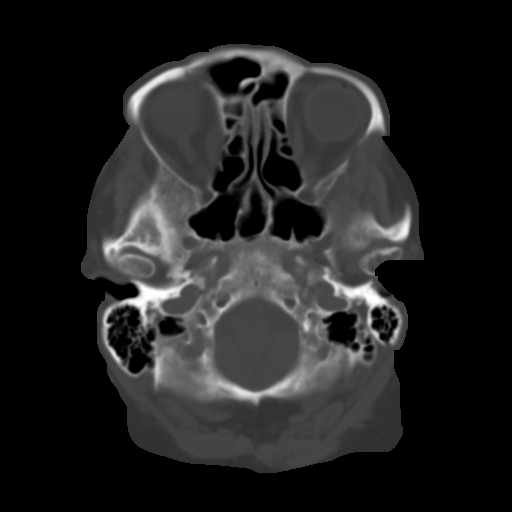
[im 6/29  brain]
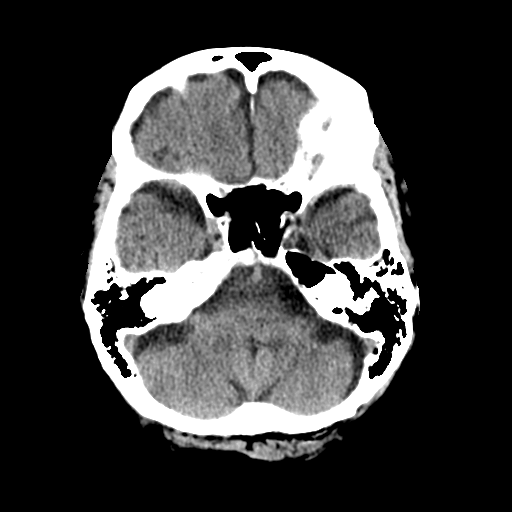
[im 8/29  brain]
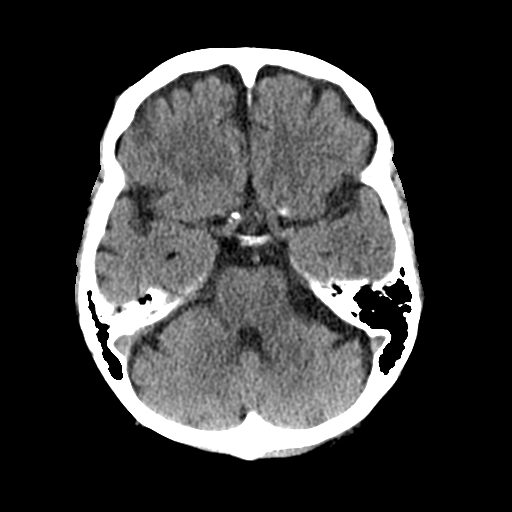
[im 11/29  brain]
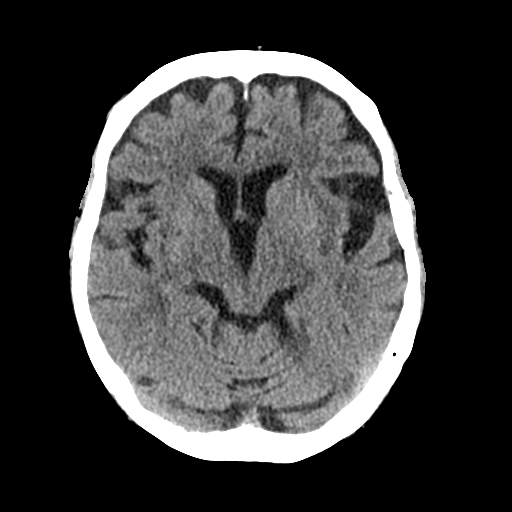
[im 14/29  brain]
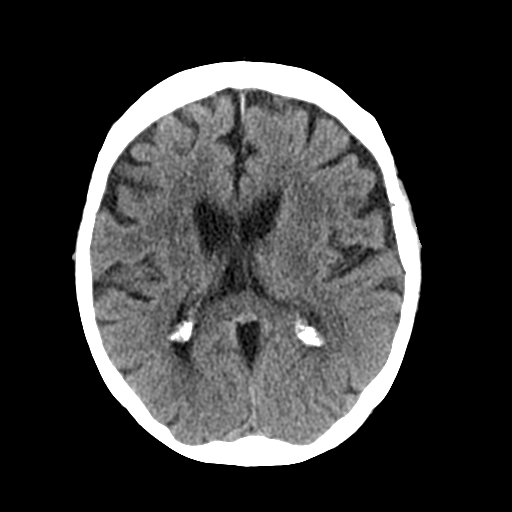
[im 14/29  bone]
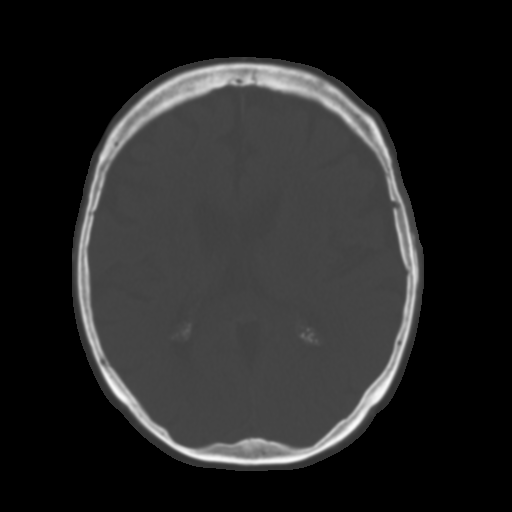
[im 16/29  brain]
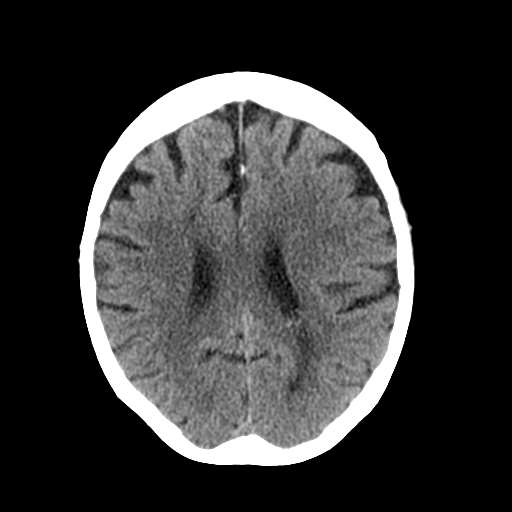
[im 19/29  brain]
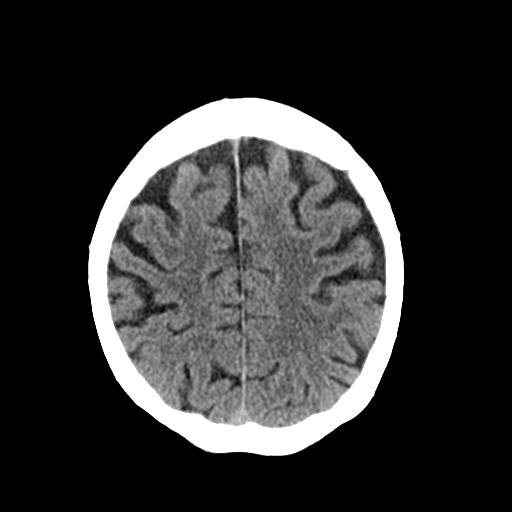
[im 22/29  brain]
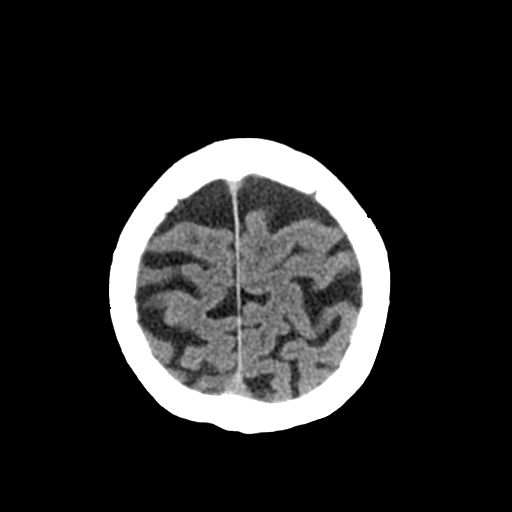
[im 24/29  brain]
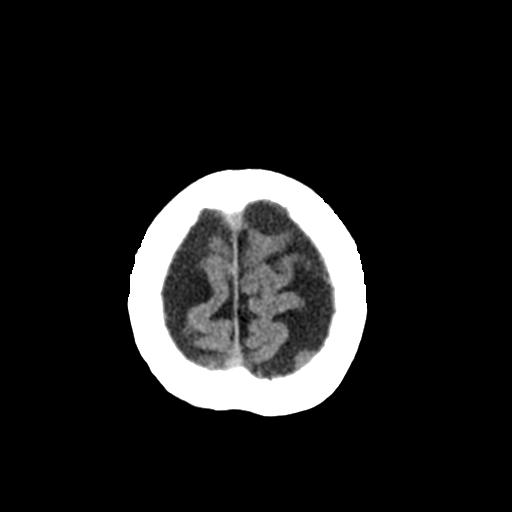
[im 24/29  bone]
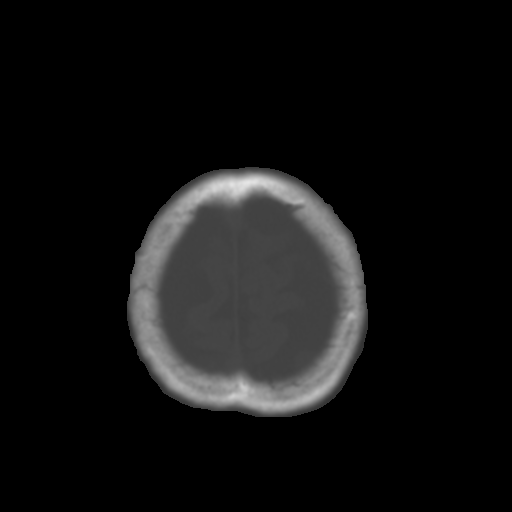
[im 27/29  brain]
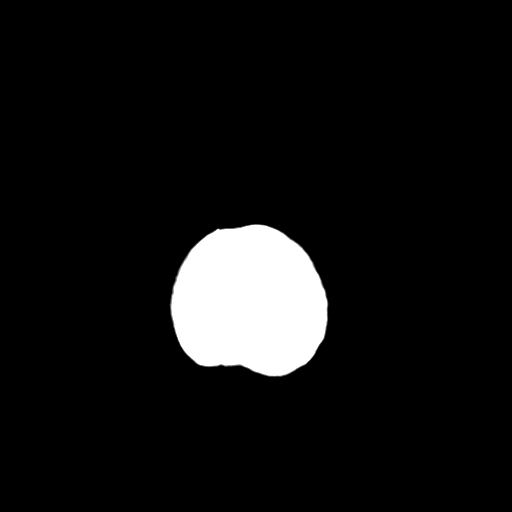

[Series 4: coronal soft tissue · coronal · 0.30mm/px · 3 of 63 slices shown]
[im 21/63  brain]
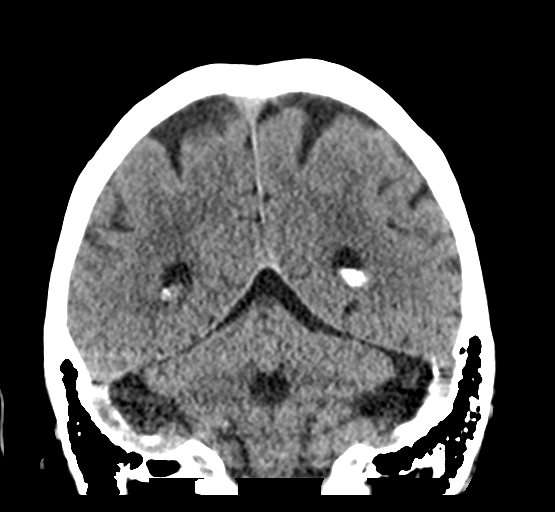
[im 28/63  brain]
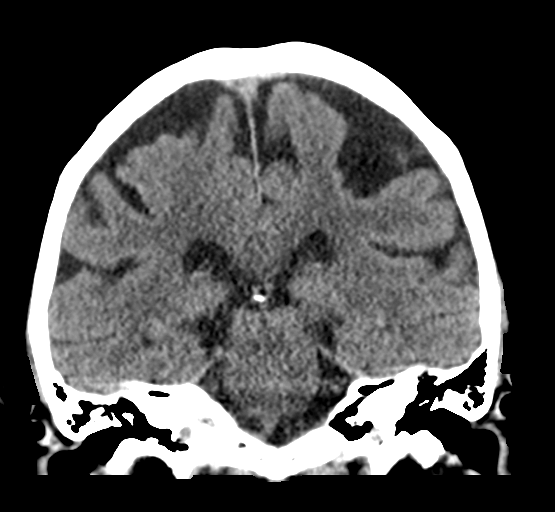
[im 35/63  brain]
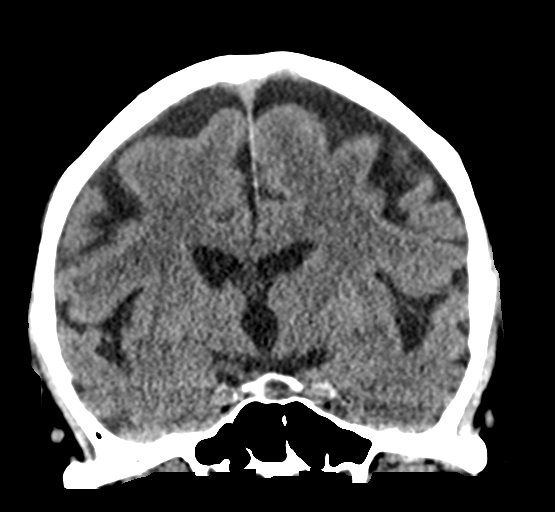

[Series 5: sagittal soft tissue · sagittal · 0.30mm/px · 3 of 49 slices shown]
[im 17/49  brain]
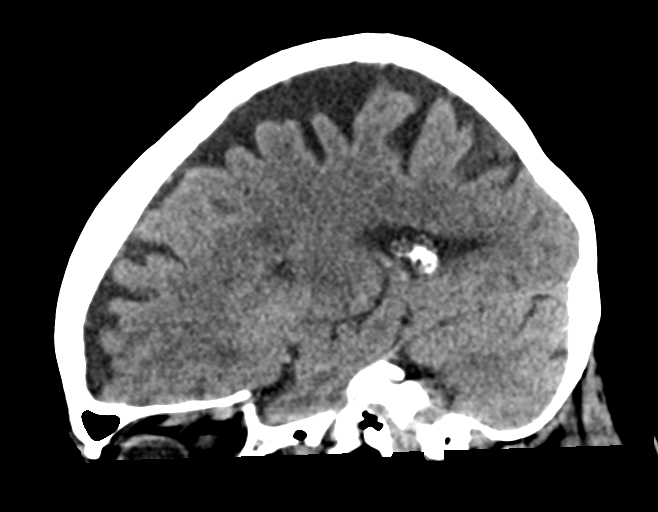
[im 25/49  brain]
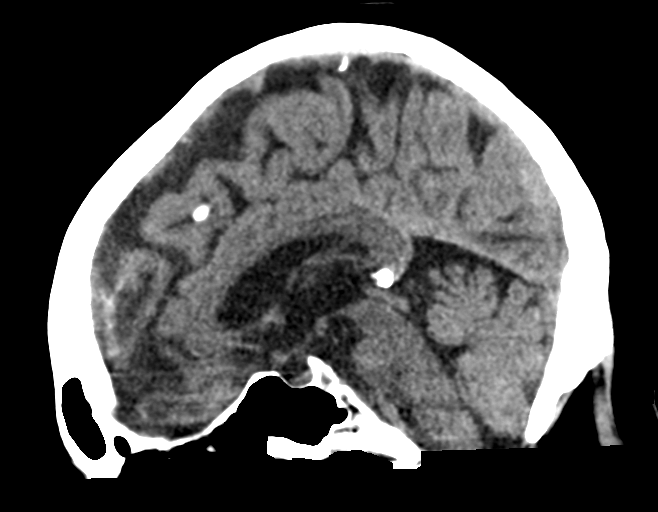
[im 33/49  brain]
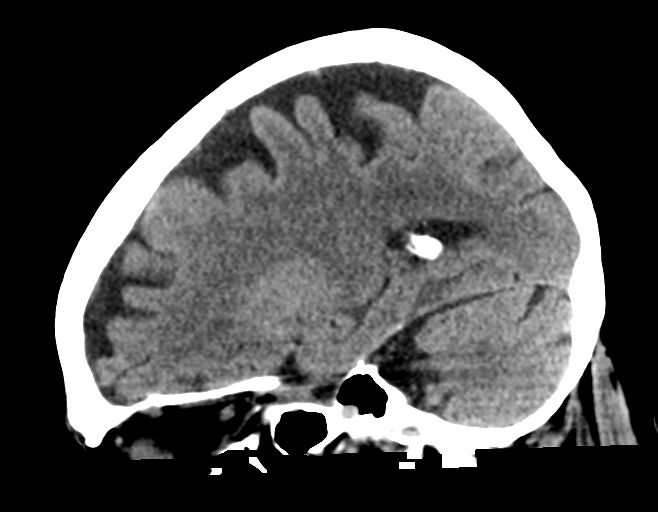

[16 of 46 positions shown; findings below may reference images not displayed]

FINDINGS: Brain: No evidence of acute infarction, hemorrhage, hydrocephalus,
extra-axial collection or mass lesion/mass effect. There is mild
diffuse low-attenuation within the subcortical and periventricular
white matter compatible with chronic microvascular disease. Remote
right basal ganglia lacunar infarct is again noted. Prominence of
sulci and ventricles compatible with brain atrophy.

Vascular: No hyperdense vessel or unexpected calcification.

Skull: Normal. Negative for fracture or focal lesion.

Sinuses/Orbits: No acute finding.

Other: None
IMPRESSION: 1. No acute intracranial abnormalities.
2. Chronic small vessel ischemic disease and brain atrophy.

## 2023-04-06 IMAGING — DX DG CHEST 1V PORT
1 series · 1 of 1 positions shown · non-contrast
Comparison: 12/19/2019

CLINICAL DATA: Syncope.  Atrial fibrillation and hypertension

EXAM:
PORTABLE CHEST 1 VIEW

[chest ap]
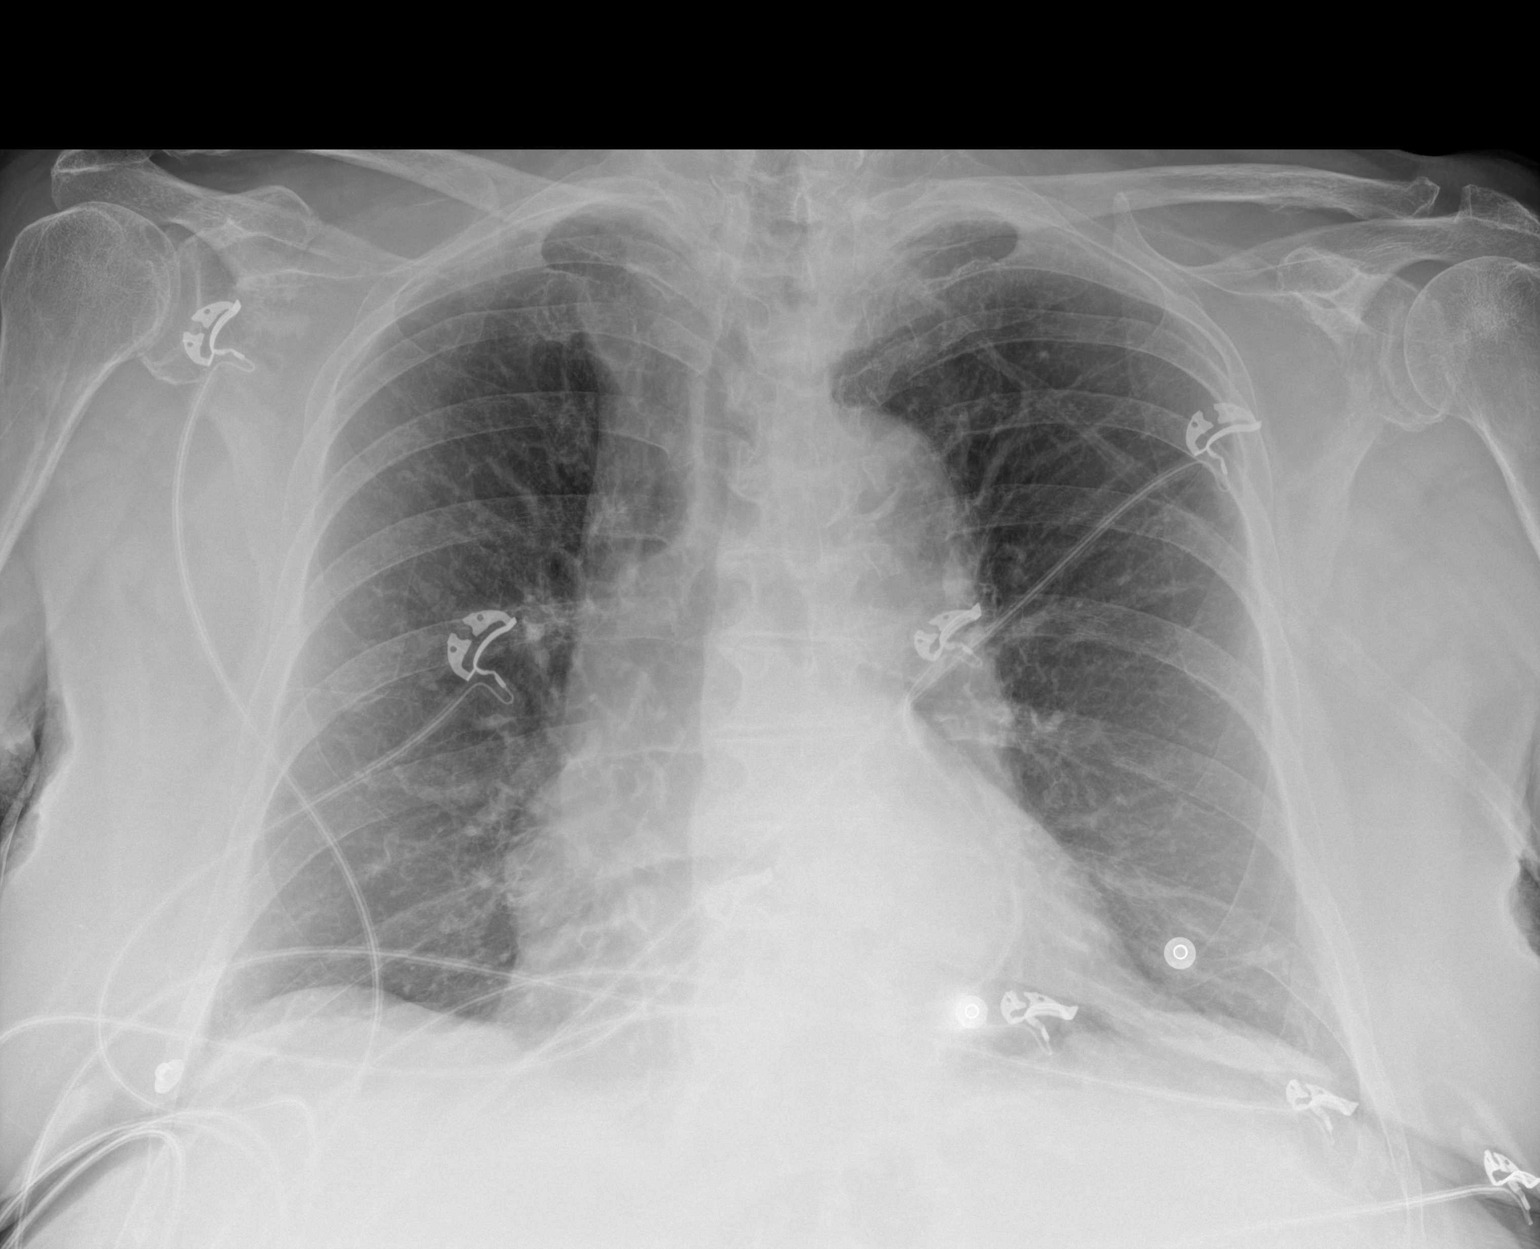

[1 of 1 positions shown; findings below may reference images not displayed]

FINDINGS: Heart size within normal limits. Negative for heart failure. Mild
left lower lobe atelectasis. Right lung clear. No edema or effusion.
IMPRESSION: Mild left lower lobe atelectasis.

## 2023-04-06 IMAGING — CT CT ANGIO CHEST
2 of 6 series · 17 of 46 positions shown · IV contrast (APPLIED)
Comparison: CT chest 06/28/2005

CLINICAL DATA: Syncopal episode, normal neurological exam,
tachycardia, atrial fibrillation, history hypertension, hypertension
heart disease, asthma

EXAM:
CT ANGIOGRAPHY CHEST WITH CONTRAST
TECHNIQUE: Multidetector CT imaging of the chest was performed using the
standard protocol during bolus administration of intravenous
contrast. Multiplanar CT image reconstructions and MIPs were
obtained to evaluate the vascular anatomy.
CONTRAST:  75mL OMNIPAQUE IOHEXOL 350 MG/ML SOLN IV

[Series 5: thins · axial · 0.74mm/px · z∈[-213,+65]mm · 14 of 306 slices shown]
[im 14/306  lung]
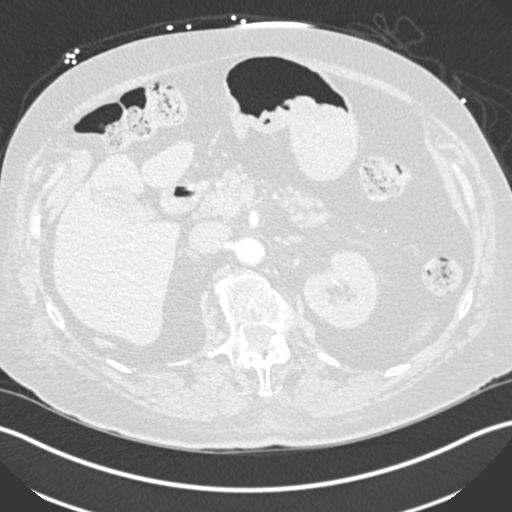
[im 40/306  soft-tissue]
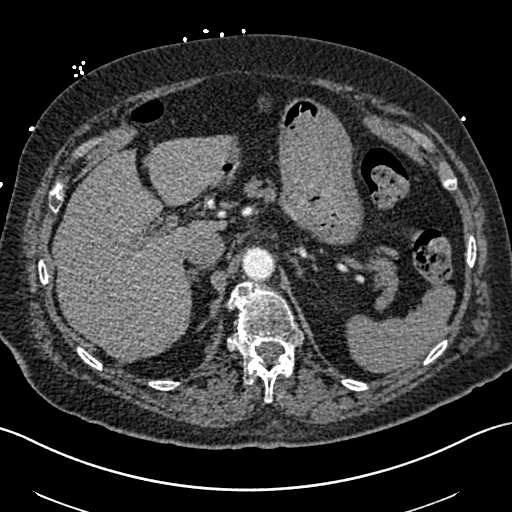
[im 54/306  lung]
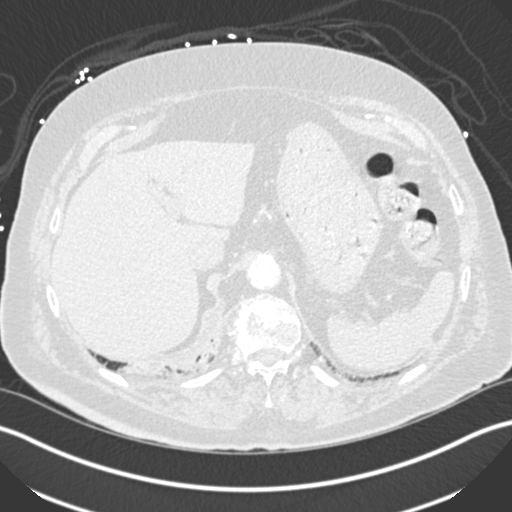
[im 80/306  soft-tissue]
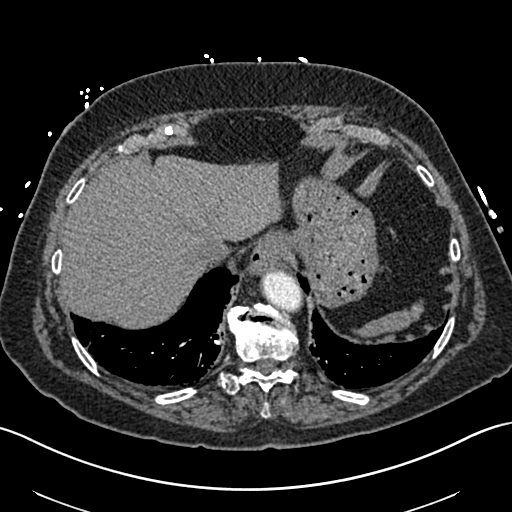
[im 107/306  lung]
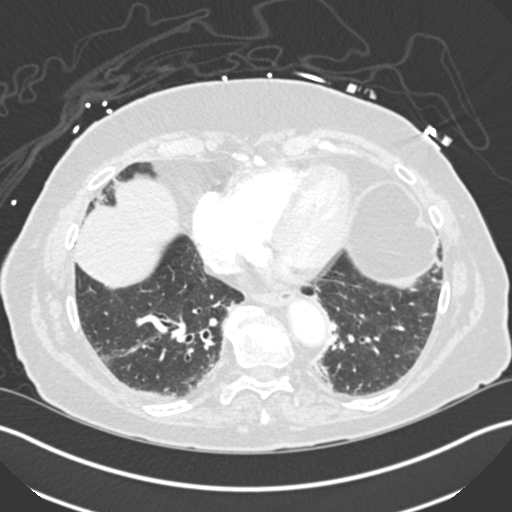
[im 120/306  soft-tissue]
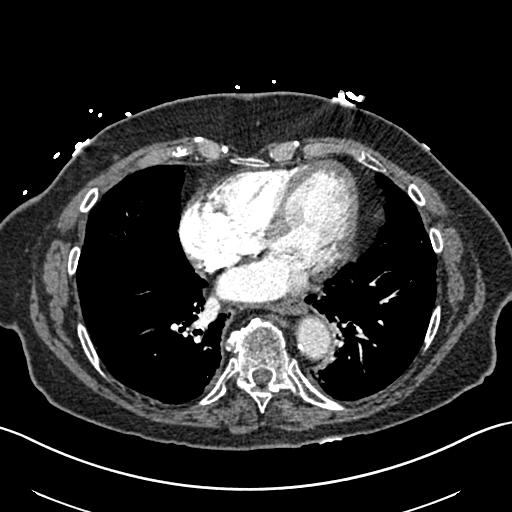
[im 146/306  lung]
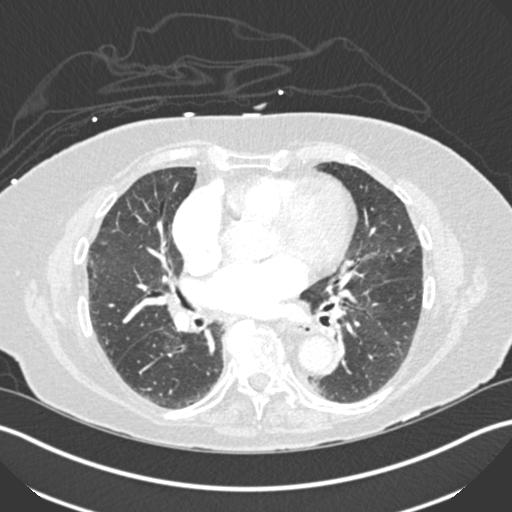
[im 160/306  soft-tissue]
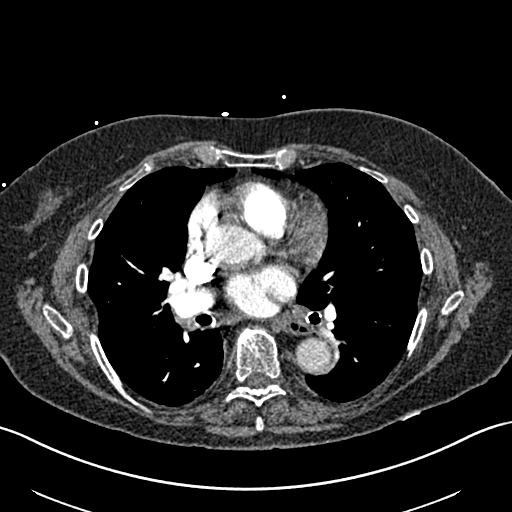
[im 186/306  lung]
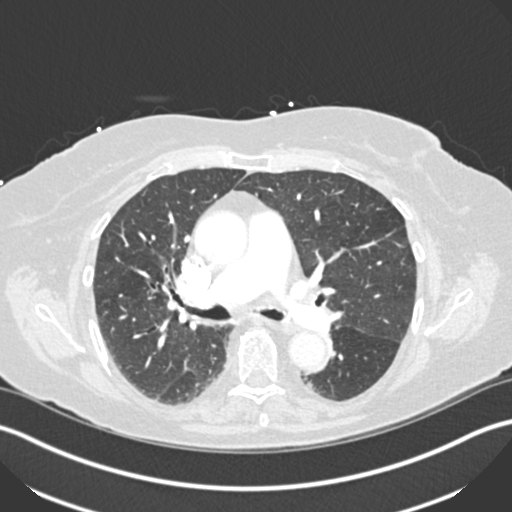
[im 199/306  soft-tissue]
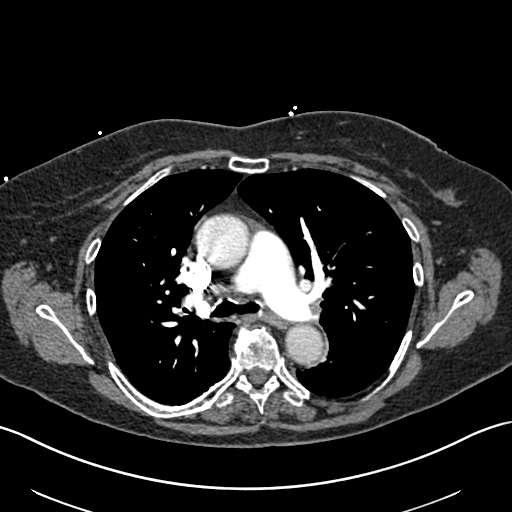
[im 226/306  lung]
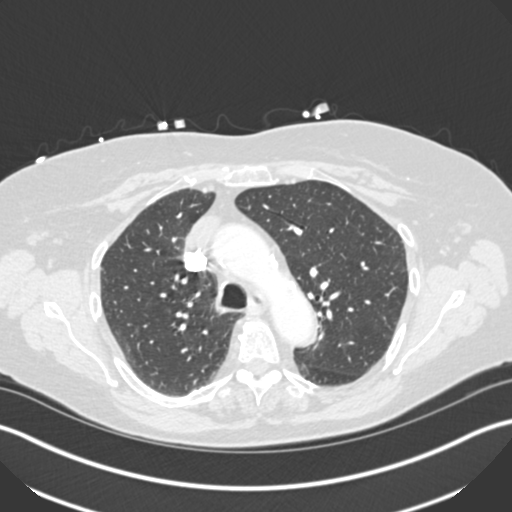
[im 252/306  soft-tissue]
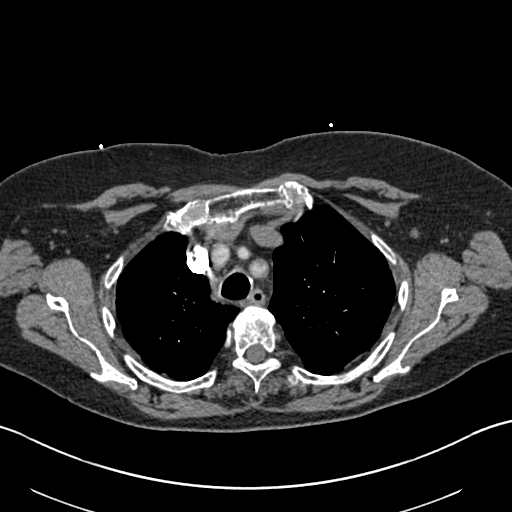
[im 266/306  lung]
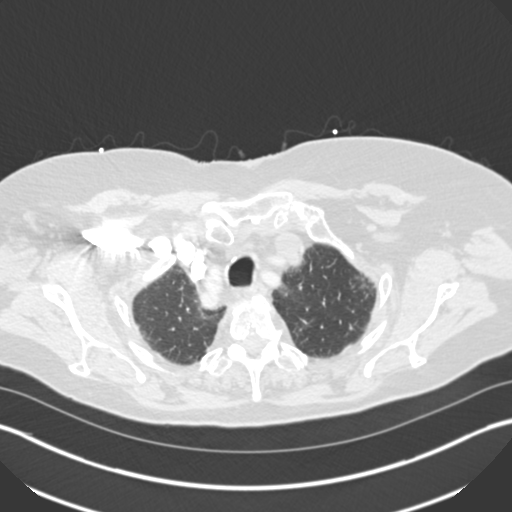
[im 292/306  soft-tissue]
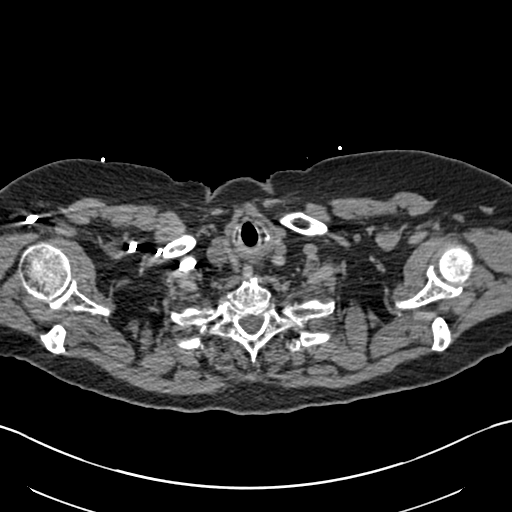

[Series 7: coronal mpr · coronal · 0.64mm/px · 3 of 103 slices shown]
[im 26/103  soft-tissue]
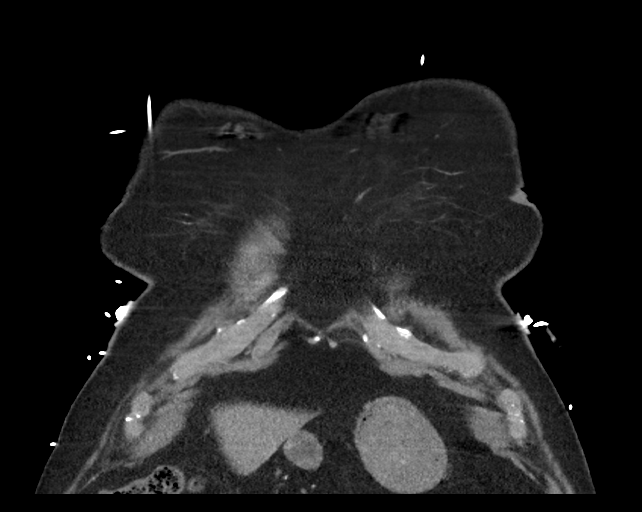
[im 52/103  soft-tissue]
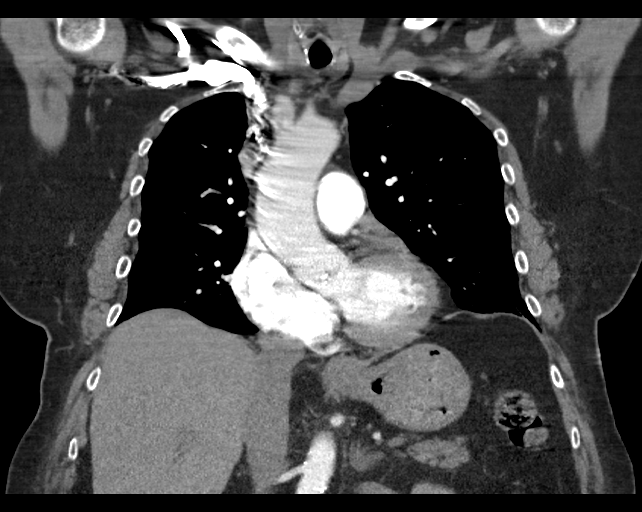
[im 77/103  soft-tissue]
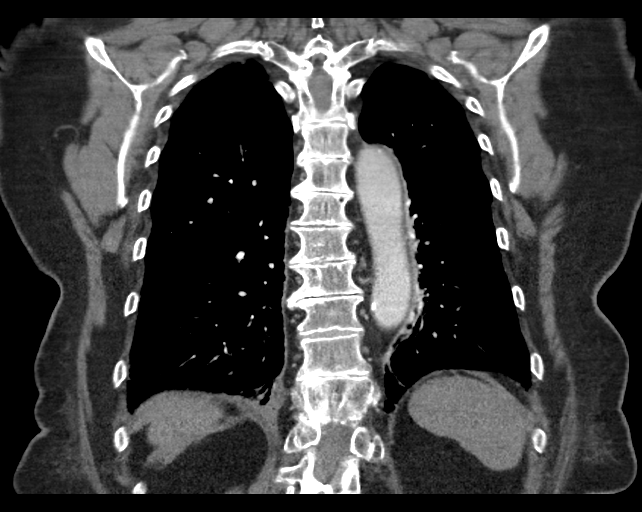

[17 of 46 positions shown; findings below may reference images not displayed]

FINDINGS: Cardiovascular: Scattered atherosclerotic calcifications aorta,
great vessels, and coronary arteries. Aneurysmal dilatation
ascending thoracic aorta 4.0 cm transverse image 39. Cardiac
chambers minimally prominent. No pericardial effusion. Pulmonary
arteries adequately opacified and patent. No evidence of pulmonary
embolism.

Mediastinum/Nodes: Esophagus unremarkable. Few normal size
mediastinal lymph nodes. No thoracic adenopathy. Small nodule at
inferior pole of RIGHT thyroid lobe 14 x 13 mm image 9, 11 x 9 mm in
4007; Not clinically significant; no follow-up imaging recommended
(ref: [HOSPITAL]. [DATE]): 143-50). Base of cervical
region otherwise normal appearance.

Lungs/Pleura: 6 mm anterior RIGHT upper lobe nodule image 44
unchanged. Dependent atelectasis in the lower lobes bilaterally
greater on RIGHT. Minimal peripheral atelectasis or scarring in both
lungs. Nodular area of density at the lateral LEFT lung base 7 x 6
mm image 59, appears to represent atelectasis on sagittal images.
No acute infiltrate, pleural effusion, or pneumothorax.

Upper Abdomen: Food debris in stomach. Remaining visualized upper
abdomen unremarkable

Musculoskeletal: No acute osseous findings. Scattered degenerative
disc disease changes thoracic spine.

Review of the MIP images confirms the above findings.
IMPRESSION: No evidence of pulmonary embolism.

Scattered parenchymal lung scarring and dependent atelectasis at the
lung bases.

Aneurysmal dilatation ascending thoracic aorta 4.0 cm diameter;
Recommend annual imaging followup by CTA or MRA, if clinically
indicated based on patient age and comorbidities. This
recommendation follows 4393
ACCF/AHA/AATS/ACR/ASA/SCA/MYRTHA/CESAR/CHRITI/EISELE Guidelines for the
Diagnosis and Management of Patients with Thoracic Aortic Disease.
Circulation. 4393; 121: E266-e369. Aortic aneurysm NOS (K6MCU-UYY.8)

Aortic Atherosclerosis (K6MCU-GS7.7) and Aortic aneurysm NOS
(K6MCU-UYY.8).

## 2023-04-12 DIAGNOSIS — H353211 Exudative age-related macular degeneration, right eye, with active choroidal neovascularization: Secondary | ICD-10-CM | POA: Diagnosis not present

## 2023-04-17 ENCOUNTER — Other Ambulatory Visit: Payer: Self-pay | Admitting: Pulmonary Disease

## 2023-04-17 MED ORDER — TRELEGY ELLIPTA 100-62.5-25 MCG/ACT IN AEPB: 1.0000 | INHALATION_SPRAY | Freq: Every day | RESPIRATORY_TRACT | 3 refills | Status: AC

## 2023-04-17 NOTE — Telephone Encounter (Signed)
 Copied from CRM [id number]. Topic: Clinical - Medication Refill >> Apr 17, 2023 10:20 AM Hilton Lucky wrote: Most Recent Primary Care Visit:   Medication: Fluticasone-Umeclidin-Vilant (TRELEGY ELLIPTA) 100-62.5-25 MCG/ACT AEPB  Has the patient contacted their pharmacy? Yes - states we never provided response to clinic - daughter inquiring about a sample available.   Is this the correct pharmacy for this prescription? Yes  This is the patient's preferred pharmacy:   [facility] [place]  - [place], [place] - [address] [facility] [address] [place] [address] Phone: [phone] Fax: [phone]   Has the prescription been filled recently? No  Is the patient out of the medication? No  Has the patient been seen for an appointment in the last year OR does the patient have an upcoming appointment? Yes  Can we respond through MyChart? Yes  Agent: Please be advised that Rx refills may take up to 3 business days. We ask that you follow-up with your pharmacy.

## 2023-04-24 DIAGNOSIS — B351 Tinea unguium: Secondary | ICD-10-CM | POA: Diagnosis not present

## 2023-04-24 DIAGNOSIS — I7091 Generalized atherosclerosis: Secondary | ICD-10-CM | POA: Diagnosis not present

## 2023-05-16 ENCOUNTER — Other Ambulatory Visit: Payer: Self-pay

## 2023-05-16 MED ORDER — LOSARTAN POTASSIUM 50 MG PO TABS: 50.0000 mg | ORAL_TABLET | Freq: Two times a day (BID) | ORAL | 0 refills | Status: DC

## 2023-05-16 MED ORDER — FUROSEMIDE 40 MG PO TABS: ORAL_TABLET | ORAL | 0 refills | Status: DC

## 2023-05-18 DIAGNOSIS — J4541 Moderate persistent asthma with (acute) exacerbation: Secondary | ICD-10-CM | POA: Diagnosis not present

## 2023-05-18 DIAGNOSIS — R6889 Other general symptoms and signs: Secondary | ICD-10-CM | POA: Diagnosis not present

## 2023-05-22 ENCOUNTER — Other Ambulatory Visit: Payer: Self-pay | Admitting: Pulmonary Disease

## 2023-05-22 ENCOUNTER — Ambulatory Visit: Payer: Self-pay | Admitting: Family Medicine

## 2023-05-22 DIAGNOSIS — J45901 Unspecified asthma with (acute) exacerbation: Secondary | ICD-10-CM

## 2023-05-22 MED ORDER — PROAIR RESPICLICK 108 (90 BASE) MCG/ACT IN AEPB: 2.0000 | INHALATION_SPRAY | Freq: Four times a day (QID) | RESPIRATORY_TRACT | 1 refills | Status: DC | PRN

## 2023-05-22 NOTE — Telephone Encounter (Signed)
 Chief Complaint: Difficulty breathing since last week  Symptoms: Wheezing, loose cough but not getting everything up, fullness in head, a little dizzy  Disposition:  [x] Appointment (In office)  Additional Notes: Spoke with patient's daughter, Thersia Flax. Pt developed a cough and congestion last Wednesday. Pt went to PCP last week and was given a Kenalog  shot, doxycyline, and Tessalon pearls. Pt will not finish antibiotic until Thursday. Pt is better but is still "breathing hard." Pt is taking Mucinex  twice a day. Pt scheduled for appointment on Wednesday, 5/21. This RN educated pt daughter on new-worsening symptoms and when to call back/seek emergent care. Pt daughter verbalized understanding and agrees to plan.   Copied from CRM [id number]. Topic: Clinical - Red Word Triage >> May 22, 2023 11:18 AM [name] wrote: [facility] that prompted transfer to Nurse Triage: Thersia Flax - daughter states patient experiencing breathing heavy and wheezing, not currently with the patient. Reason for Disposition  [1] MODERATE longstanding difficulty breathing (e.g., speaks in phrases, SOB even at rest, pulse 100-120) AND [2] SAME as normal  Answer Assessment - Initial Assessment Questions Chief Complaint: Difficulty breathing since last week  Symptoms: Wheezing, loose cough but not getting everything up, fullness in head, a little dizzy  SEVERITY: "How bad is your breathing?" (e.g., mild, moderate, severe)    - MILD: No SOB at rest, mild SOB with walking, speaks normally in sentences, can lie down, no retractions, pulse < 100.    - MODERATE: SOB at rest, SOB with minimal exertion and prefers to sit, cannot lie down flat, speaks in phrases, mild retractions, audible wheezing, pulse 100-120.    - SEVERE: Very SOB at rest, speaks in single words, struggling to breathe, sitting hunched forward, retractions, pulse > 120      Not getting up much because her head feels full, mild to moderate; pt feels better on phone per pt  daughter  OTHER SYMPTOMS: "Do you have any other symptoms? (e.g., dizziness, runny nose, cough, chest pain, fever)     A little of dizziness  O2 SATURATION MONITOR:  "Do you use an oxygen saturation monitor (pulse oximeter) at home?" If Yes, ask: "What is your reading (oxygen level) today?" "What is your usual oxygen saturation reading?" (e.g., 95%)       "I don't know"  Protocols used: Breathing Difficulty-A-AH

## 2023-05-22 NOTE — Telephone Encounter (Signed)
 FYI- patient is scheduled to see Dr Auston Left Wednesday.

## 2023-05-22 NOTE — Telephone Encounter (Signed)
 Copied from CRM [id number]. Topic: Clinical - Medication Refill >> May 22, 2023 11:16 AM [name] wrote: Medication: Albuterol  Sulfate (PROAIR  RESPICLICK) 108 (90 Base) MCG/ACT AEPB   Has the patient contacted their pharmacy? Yes (Agent: If no, request that the patient contact the pharmacy for the refill. If patient does not wish to contact the pharmacy document the reason why and proceed with request.) (Agent: If yes, when and what did the pharmacy advise?)  This is the patient's preferred pharmacy:  [id number] [place], [place] - [address] [place] [address] Phone: [phone] Fax: [phone]  Is this the correct pharmacy for this prescription? Yes If no, delete pharmacy and type the correct one.   Has the prescription been filled recently? Yes  Is the patient out of the medication? Yes  Has the patient been seen for an appointment in the last year OR does the patient have an upcoming appointment? Yes  Can we respond through MyChart? No  Agent: Please be advised that Rx refills may take up to 3 business days. We ask that you follow-up with your pharmacy.

## 2023-05-23 ENCOUNTER — Other Ambulatory Visit: Payer: Self-pay

## 2023-05-23 MED ORDER — PROAIR RESPICLICK 108 (90 BASE) MCG/ACT IN AEPB: 2.0000 | INHALATION_SPRAY | Freq: Four times a day (QID) | RESPIRATORY_TRACT | 3 refills | Status: DC | PRN

## 2023-05-23 MED ORDER — METHYLPREDNISOLONE 4 MG PO TBPK: ORAL_TABLET | ORAL | 0 refills | Status: DC

## 2023-05-23 NOTE — Telephone Encounter (Signed)
 Will send Rx to pharmacy for some Medrol  and reassess her in the morning as scheduled with Dr. Auston Left.  If symptoms worsen she should go to the ED.  Does she have albuterol  and is she using it?

## 2023-05-23 NOTE — Telephone Encounter (Signed)
 I have notified KATHY WOODWARD (DPR).  Nothing further needed.

## 2023-05-23 NOTE — Addendum Note (Signed)
 Addended by: Pepper Boyer on: 05/23/2023 11:42 AM   Modules accepted: Orders

## 2023-05-24 ENCOUNTER — Ambulatory Visit: Admitting: Internal Medicine

## 2023-05-24 ENCOUNTER — Encounter: Payer: Self-pay | Admitting: Internal Medicine

## 2023-05-24 VITALS — BP 120/88 | HR 67 | Temp 97.9°F | Ht 63.0 in | Wt 218.6 lb

## 2023-05-24 DIAGNOSIS — J479 Bronchiectasis, uncomplicated: Secondary | ICD-10-CM | POA: Insufficient documentation

## 2023-05-24 DIAGNOSIS — I5032 Chronic diastolic (congestive) heart failure: Secondary | ICD-10-CM | POA: Diagnosis not present

## 2023-05-24 DIAGNOSIS — J4541 Moderate persistent asthma with (acute) exacerbation: Secondary | ICD-10-CM

## 2023-05-24 DIAGNOSIS — I480 Paroxysmal atrial fibrillation: Secondary | ICD-10-CM

## 2023-05-24 MED ORDER — IPRATROPIUM-ALBUTEROL 0.5-2.5 (3) MG/3ML IN SOLN: 3.0000 mL | Freq: Four times a day (QID) | RESPIRATORY_TRACT | 10 refills | Status: AC | PRN

## 2023-05-24 MED ORDER — IPRATROPIUM-ALBUTEROL 0.5-2.5 (3) MG/3ML IN SOLN
3.0000 mL | Freq: Once | RESPIRATORY_TRACT | Status: AC
  Administered 2023-05-24: 3 mL via RESPIRATORY_TRACT

## 2023-05-24 NOTE — Progress Notes (Signed)
 [facility] Consultation      Date: 05/24/2023,   MRN# [id number] Jessica Kaufman 1925-02-15  89 year old lifelong never smoker who presents for follow-up on the issue of asthma and asthmatic bronchitis. She was last seen here on 22 February 2022.  At that time she was doing well on Trelegy Ellipta  and on her CPAP for obstructive sleep apnea.      CHIEF COMPLAINT:   Acute SOB   HISTORY OF PRESENT ILLNESS   89 year old white female seen today for acute asthma exacerbation with ongoing signs and symptoms of chest congestion cough nasal congestion low-grade fevers not feeling well  Signs symptoms of acute viral bronchitis with acute exacerbation Patient was started on doxycycline last week also was given Kenalog  steroid injection Patient was started on oral prednisone  in the last 24 hours  Will plan for bronchodilator therapy in office today Recommend continue antibiotics and prednisone  as prescribed Continue inhalers as prescribed Will add nebulizers to her regimen   PAST MEDICAL HISTORY   Past Medical History:  Diagnosis Date   Aortic valve disease    Mild-moderate aortic stenosis   Asthma    Cancer (HCC)    basal cell    Gout    Hyperlipidemia    Hypertension    Hypertensive heart disease without heart failure    Hypothyroidism    PAF (paroxysmal atrial fibrillation) (HCC)    PVC (premature ventricular contraction)    Rheumatoid arthritis (HCC)    Ventricular premature depolarization    Immunization History  Administered Date(s) Administered   Fluad Quad(high Dose 65+) 10/05/2022   Influenza, High Dose Seasonal PF 10/25/2018   Influenza, Quadrivalent, Recombinant, Inj, Pf 12/01/2020   Influenza-Unspecified 09/26/2021   Moderna SARS-COV2 Booster Vaccination 01/08/2020   Moderna Sars-Covid-2 Vaccination 01/19/2019, 02/15/2019   RSV,unspecified 10/26/2021   Tdap 03/19/2021   Zoster Recombinant(Shingrix) 05/17/2017, 07/24/2017      SURGICAL HISTORY   Past Surgical History:  Procedure Laterality Date   BLADDER SURGERY  10/19/2010   BONY PELVIS SURGERY  1990   BREAST BIOPSY Left 1986   CARDIAC SURGERY  2009   PVC   CATARACT EXTRACTION Left 11/15/00   CATARACT EXTRACTION Right    ELBOW SURGERY  06/1990   HIP SURGERY Left 05/28/09   HIP SURGERY Right 06/30/03   HYSTEROSCOPY  06/17/1986   vaginal     FAMILY HISTORY   Family History  Problem Relation Age of Onset   Stroke Mother    Melanoma Mother    Heart attack Father    Uterine cancer Sister    Melanoma Sister    Heart failure Sister    Heart attack Maternal Grandmother    Breast cancer Daughter      SOCIAL HISTORY   Social History   Tobacco Use   Smoking status: Never   Smokeless tobacco: Never  Vaping Use   Vaping status: Never Used  Substance Use Topics   Alcohol use: No   Drug use: No     MEDICATIONS    Home Medication:  Current Outpatient Rx   Order #: [id number] Class: Historical Med   Order #: [id number] Class: Normal   Order #: [id number] Class: Historical Med   Order #: [id number] Class: Normal   Order #: [id number] Class: Historical Med   Order #: [id number] Class: Historical Med   Order #: [id number] Class: Normal   Order #: [id number] Class: Historical Med   Order #: [id number] Class: Historical Med   Order #: [id number] Class: Historical Med   Order #: [id number] Class: Historical  Med   Order #: [id number] Class: Normal   Order #: [id number] Class: Normal   Order #: [id number] Class: Historical Med   Order #: [id number] Class: Historical Med   Order #: [id number] Class: Historical Med   Order #: [id number] Class: Normal   Order #: [id number] Class: Historical Med   Order #: [id number] Class: Normal   Order #: [id number] Class: Normal   Order #: [id number] Class: Historical Med   Order #: [id number] Class: Historical Med   Order #: [id number] Class: Historical Med   Order #: [id number] Class: Historical Med   Order #: [id number] Class: Historical Med   Order  #: [id number] Class: Historical Med   Order #: [id number] Class: Historical Med    Current Medication:  Current Outpatient Medications:    acetaminophen  (TYLENOL ) 650 MG CR tablet, Take 650 mg by mouth every 8 (eight) hours as needed for pain., Disp: , Rfl:    Albuterol  Sulfate (PROAIR  RESPICLICK) 108 (90 Base) MCG/ACT AEPB, Inhale 2 puffs into the lungs every 6 (six) hours as needed., Disp: 3 each, Rfl: 3   apixaban  (ELIQUIS ) 5 MG TABS tablet, Take 5 mg by mouth 2 (two) times daily. , Disp: , Rfl:    atorvastatin  (LIPITOR) 40 MG tablet, Take 1 tablet (40 mg total) by mouth daily., Disp: 30 tablet, Rfl: 0   b complex vitamins tablet, Take 1 tablet by mouth daily. (Patient not taking: Reported on 11/28/2022), Disp: , Rfl:    Bioflavonoid Products (BIOFLEX) TABS, Take 1 tablet by mouth in the morning and at bedtime., Disp: , Rfl:    carvedilol  (COREG ) 6.25 MG tablet, Take 1 tablet (6.25 mg total) by mouth 2 (two) times daily., Disp: 180 tablet, Rfl: 3   Cholecalciferol  (VITAMIN D3) 1000 UNITS CAPS, Take 1 capsule by mouth daily., Disp: , Rfl:    Coenzyme Q10 (COQ10) 100 MG CAPS, Take 1 capsule by mouth daily., Disp: , Rfl:    famotidine  (PEPCID ) 20 MG tablet, Take 20 mg by mouth daily., Disp: , Rfl:    fexofenadine (ALLEGRA) 180 MG tablet, Take 180 mg by mouth as needed., Disp: , Rfl:    Fluticasone -Umeclidin-Vilant (TRELEGY ELLIPTA ) 100-62.5-25 MCG/ACT AEPB, Inhale 1 puff into the lungs daily., Disp: 180 each, Rfl: 3   furosemide  (LASIX ) 40 MG tablet, Take Furosemide  40 mg one tablet every am & one-half (20 mg) tablet at noon./PLEASE CALL OFFICE TO SCHEDULE APPOINTMENT PRIOR TO NEXT REFILL, Disp: 135 tablet, Rfl: 0   gabapentin  (NEURONTIN ) 600 MG tablet, Take 600 mg by mouth 2 (two) times daily., Disp: , Rfl:    Ginger , Zingiber officinalis, (GINGER  ROOT PO), Take by mouth daily., Disp: , Rfl:    guaiFENesin  (MUCINEX ) 600 MG 12 hr tablet, Take 600 mg by mouth daily., Disp: , Rfl:    hydrALAZINE   (APRESOLINE ) 25 MG tablet, TAKE 1 TABLET (25 MG)  BY MOUTH UP TO THREE TIMES A DAY AS NEEDED FOR SYSTOLIC BP OF 170 OR GREATER, Disp: 270 tablet, Rfl: 3   levothyroxine  (SYNTHROID , LEVOTHROID) 50 MCG tablet, Take 1 tablet by mouth daily., Disp: , Rfl:    losartan  (COZAAR ) 50 MG tablet, Take 1 tablet (50 mg total) by mouth 2 (two) times daily., Disp: 180 tablet, Rfl: 0   methylPREDNISolone  (MEDROL  DOSEPAK) 4 MG TBPK tablet, Take as directed in the package.  This is a taper pack, Disp: 21 tablet, Rfl: 0   Multiple Vitamins-Minerals (CENTRUM SILVER PO), Take 1 tablet by mouth daily., Disp: , Rfl:    Multiple Vitamins-Minerals (PRESERVISION AREDS 2 PO), Take 1 capsule by mouth 2 (two)  times daily. , Disp: , Rfl:    MYRBETRIQ  25 MG TB24 tablet, Take 25 mg by mouth daily., Disp: , Rfl:    Omega-3 Fatty Acids (FISH OIL) 1000 MG CAPS, Take 1 capsule by mouth in the morning and at bedtime. (Patient not taking: Reported on 11/29/2022), Disp: , Rfl:    PREMARIN vaginal cream, Place 1 Applicatorful vaginally in the morning and at bedtime., Disp: , Rfl:    psyllium (METAMUCIL) 58.6 % powder, Take 1 packet by mouth daily., Disp: , Rfl:    TURMERIC PO, Take by mouth daily., Disp: , Rfl:     ALLERGIES   Metronidazole, Sulindac, and Naproxen  BP 120/88 (BP Location: Right Arm, Patient Position: Sitting, Cuff Size: Large)   Pulse 67   Temp 97.9 F (36.6 C) (Oral)   Ht 5\' 3"  (1.6 m)   Wt 218 lb 9.6 oz (99.2 kg)   SpO2 93%   BMI 38.72 kg/m    Review of Systems: Gen:  Denies  fever, sweats, chills weight loss  HEENT: Denies blurred vision, double vision, ear pain, eye pain, hearing loss, nose bleeds, sore throat Cardiac:  No dizziness, chest pain or heaviness, chest tightness,edema, No JVD Resp:   + cough, +sputum production, +shortness of breath,+wheezing, -hemoptysis,  Other:  All other systems negative   Physical Examination:   General Appearance: No distress  EYES PERRLA, EOM intact.   NECK  Supple, No JVD Pulmonary: normal breath sounds, +wheezing.  CardiovascularNormal S1,S2.  No m/r/g.   Abdomen: Benign, Soft, non-tender. Neurology UE/LE 5/5 strength, no focal deficits Ext pulses intact, cap refill intact ALL OTHER ROS ARE NEGATIVE       ASSESSMENT/PLAN    89 year old [place] white female seen today for acute asthma exacerbation likely related to viral bronchitis  Continue antibiotics as prescribed Continue prednisone  as prescribed DuoNebs in the office today Nebs every 6 hours as needed DME referral for mask for nebulizer  Asthma action plan Recommend going to ER if symptoms get worse Consider chest x-ray if symptoms persist Follow-up in 4 weeks with Jessica Kaufman      CURRENT MEDICATIONS REVIEWED AT LENGTH WITH PATIENT TODAY   Patient  satisfied with Plan of action and management. All questions answered  Follow up 4 weeks  I spent a total of 50 minutes reviewing chart data, face-to-face evaluation with the patient, counseling and coordination of care as detailed above.     Jessica Kaufman, M.D.  [facility]  Medical Director [facility] Medical Director [facility]

## 2023-05-24 NOTE — Patient Instructions (Addendum)
 DOUNEBS EVERY 6 HRS AS NEEDED Continue antibiotics as prescribed Continue prednisone  therapy as prescribed  DME referral for mask for nebulizer machine  If symptoms do not improve please go to ER or urgent care.  Avoid Allergens and Irritants Avoid secondhand smoke Avoid SICK contacts Recommend  Masking  when appropriate Recommend Keep up-to-date with vaccinations

## 2023-06-07 DIAGNOSIS — H353211 Exudative age-related macular degeneration, right eye, with active choroidal neovascularization: Secondary | ICD-10-CM | POA: Diagnosis not present

## 2023-06-23 ENCOUNTER — Ambulatory Visit: Admitting: Pulmonary Disease

## 2023-06-23 ENCOUNTER — Encounter: Payer: Self-pay | Admitting: Pulmonary Disease

## 2023-06-23 VITALS — BP 134/84 | HR 62 | Temp 97.8°F | Ht 63.0 in | Wt 218.6 lb

## 2023-06-23 DIAGNOSIS — R058 Other specified cough: Secondary | ICD-10-CM

## 2023-06-23 DIAGNOSIS — J479 Bronchiectasis, uncomplicated: Secondary | ICD-10-CM

## 2023-06-23 DIAGNOSIS — G4733 Obstructive sleep apnea (adult) (pediatric): Secondary | ICD-10-CM

## 2023-06-23 DIAGNOSIS — J4541 Moderate persistent asthma with (acute) exacerbation: Secondary | ICD-10-CM

## 2023-06-23 MED ORDER — TRELEGY ELLIPTA 100-62.5-25 MCG/ACT IN AEPB: 1.0000 | INHALATION_SPRAY | Freq: Every day | RESPIRATORY_TRACT | Status: AC

## 2023-06-23 NOTE — Progress Notes (Signed)
 Subjective:    Patient ID: Jessica Kaufman, female    DOB: Feb 18, 1925, 89 y.o.   MRN: [id number]  Patient Care Team: Stephanie Charlene CROME, MD as PCP - General (Family Medicine) End, Lonni, MD as PCP - Cardiology (Cardiology) Tamea Dedra CROME, MD as Consulting Physician (Pulmonary Disease)  Chief Complaint  Patient presents with   Follow-up    Occasional cough with white phlegm and wheezing. Shortness of breath on exertion. Wearing CPAP nightly. No problems with mask or pressure.     BACKGROUND/INTERVAL:Jessica Kaufman is a 89 year old lifelong never smoker who presents for follow-up on the issue of asthma and asthmatic bronchitis. She was last seen here on 24 May 2023 by Dr. Alm Cellar as an acute visit due to asthma/asthmatic bronchitis exacerbation.  She follows here today after that visit.  HPI Discussed the use of AI scribe software for clinical note transcription with the patient, who gave verbal consent to proceed.  History of Present Illness   Jessica Kaufman is a 89 year old female with asthmatic bronchitis who presents with a post-viral cough.  She has experienced a recent exacerbation of her asthmatic bronchitis following a viral illness. The cough is described as occasional and mild, characterized as a 'little cough once in a while'.  She has been using her rescue inhaler, albuterol , twice yesterday, which alleviates the cough. She discontinued Tessalon Perles shortly after initiation, preferring to expectorate mucus.  She is currently on Trelegy daily and requests samples due to cost concerns. She has an adequate supply of DuoNeb and uses her nebulizer as needed, particularly if she experiences tightness or difficulty breathing.   She has been compliant with her CPAP however her CPAP usage was halted during her acute illness due to inability to tolerate the mask.  Her compliance is set at 100% but only 63% over 4 hours of use due to this issue.  However, overall she has  been very compliant.    Review of Systems A 10 point review of systems was performed and it is as noted above otherwise negative.   Patient Active Problem List   Diagnosis Date Noted   Bronchiectasis without complication (HCC) 05/24/2023   Obesity, morbid (HCC) 05/24/2023   TIA (transient ischemic attack) 11/28/2022   Hypertension    Asthma    Chronic heart failure with preserved ejection fraction (HFpEF) (HCC) 03/11/2021   Edema 11/05/2020   Atrial fibrillation with RVR (HCC) 05/07/2020   Gout    Hypothyroidism    Rheumatoid arthritis (HCC)    Syncope and collapse    Elevated troponin    Hypomagnesemia    Atypical chest pain 10/03/2019   Nonrheumatic aortic valve stenosis 10/03/2019   Palpitations 04/04/2019   Dizziness 04/04/2019   PSVT (paroxysmal supraventricular tachycardia) (HCC) 04/04/2019   Near syncope 02/20/2019   Labile hypertension 12/12/2017   Paroxysmal atrial fibrillation (HCC) 12/12/2017   Aortic valve disease 12/12/2017    Social History   Tobacco Use   Smoking status: Never   Smokeless tobacco: Never  Substance Use Topics   Alcohol use: No    Allergies  Allergen Reactions   Metronidazole Other (See Comments)    RASH   Sulindac Other (See Comments)    UNKNOWN   Naproxen Rash    Current Meds  Medication Sig   acetaminophen  (TYLENOL ) 650 MG CR tablet Take 650 mg by mouth every 8 (eight) hours as needed for pain.   albuterol  (VENTOLIN  HFA) 108 (90 Base) MCG/ACT inhaler Inhale 2  puffs into the lungs every 4 (four) hours as needed.   apixaban  (ELIQUIS ) 5 MG TABS tablet Take 5 mg by mouth 2 (two) times daily.    b complex vitamins tablet Take 1 tablet by mouth daily.   Bioflavonoid Products (BIOFLEX) TABS Take 1 tablet by mouth in the morning and at bedtime.   carvedilol  (COREG ) 6.25 MG tablet Take 1 tablet (6.25 mg total) by mouth 2 (two) times daily.   Cholecalciferol  (VITAMIN D3) 1000 UNITS CAPS Take 1 capsule by mouth daily.   Coenzyme Q10  (COQ10) 100 MG CAPS Take 1 capsule by mouth daily.   famotidine  (PEPCID ) 20 MG tablet Take 20 mg by mouth daily.   fexofenadine (ALLEGRA) 180 MG tablet Take 180 mg by mouth as needed.   Fluticasone -Umeclidin-Vilant (TRELEGY ELLIPTA ) 100-62.5-25 MCG/ACT AEPB Inhale 1 puff into the lungs daily.   furosemide  (LASIX ) 40 MG tablet Take Furosemide  40 mg one tablet every am & one-half (20 mg) tablet at noon./PLEASE CALL OFFICE TO SCHEDULE APPOINTMENT PRIOR TO NEXT REFILL   gabapentin  (NEURONTIN ) 600 MG tablet Take 600 mg by mouth 2 (two) times daily.   Ginger , Zingiber officinalis, (GINGER  ROOT PO) Take by mouth daily.   guaiFENesin  (MUCINEX ) 600 MG 12 hr tablet Take 600 mg by mouth daily.   hydrALAZINE  (APRESOLINE ) 25 MG tablet TAKE 1 TABLET (25 MG)  BY MOUTH UP TO THREE TIMES A DAY AS NEEDED FOR SYSTOLIC BP OF 170 OR GREATER   ipratropium-albuterol  (DUONEB) 0.5-2.5 (3) MG/3ML SOLN Take 3 mLs by nebulization every 6 (six) hours as needed.   levothyroxine  (SYNTHROID , LEVOTHROID) 50 MCG tablet Take 1 tablet by mouth daily.   losartan  (COZAAR ) 50 MG tablet Take 1 tablet (50 mg total) by mouth 2 (two) times daily.   methylPREDNISolone  (MEDROL  DOSEPAK) 4 MG TBPK tablet Take as directed in the package.  This is a taper pack   Multiple Vitamins-Minerals (CENTRUM SILVER PO) Take 1 tablet by mouth daily.   Multiple Vitamins-Minerals (PRESERVISION AREDS 2 PO) Take 1 capsule by mouth 2 (two) times daily.    MYRBETRIQ  25 MG TB24 tablet Take 25 mg by mouth daily.   Omega-3 Fatty Acids (FISH OIL) 1000 MG CAPS Take 1 capsule by mouth in the morning and at bedtime.   pravastatin (PRAVACHOL) 10 MG tablet Take 10 mg by mouth at bedtime.   PREMARIN vaginal cream Place 1 Applicatorful vaginally in the morning and at bedtime.   psyllium (METAMUCIL) 58.6 % powder Take 1 packet by mouth daily.   TURMERIC PO Take by mouth daily.    Immunization History  Administered Date(s) Administered   Fluad Quad(high Dose 65+)  10/05/2022   Influenza, High Dose Seasonal PF 10/25/2018   Influenza, Quadrivalent, Recombinant, Inj, Pf 10/13/2016, 10/02/2017, 12/01/2020   Influenza-Unspecified 09/26/2021, 11/04/2022   Moderna SARS-COV2 Booster Vaccination 01/08/2020   Moderna Sars-Covid-2 Vaccination 01/19/2019, 02/15/2019   PNEUMOCOCCAL CONJUGATE-20 03/21/2023   Pneumococcal Polysaccharide-23 06/23/2015   RSV,unspecified 10/26/2021   Tdap 03/19/2021, 05/04/2021   Zoster Recombinant(Shingrix) 05/17/2017, 07/24/2017        Objective:     BP 134/84 (BP Location: Left Arm, Patient Position: Sitting, Cuff Size: Normal)   Pulse 62   Temp 97.8 F (36.6 C) (Oral)   Ht 5' 3 (1.6 m)   Wt 218 lb 9.6 oz (99.2 kg)   SpO2 96%   BMI 38.72 kg/m   SpO2: 96 %  GENERAL: Obese woman, no acute distress.  No conversational dyspnea.  Ambulates with walker. HEAD:  Normocephalic, atraumatic.  Slight right facial droop noted from Bell's palsy. EYES: Pupils equal, round, reactive to light.  No scleral icterus.  MOUTH:  NECK: Supple. No thyromegaly. Trachea midline. No JVD.  No adenopathy. PULMONARY: Good air entry bilaterally.  No adventitious sounds. CARDIOVASCULAR: S1 and S2. Regular rate and rhythm.  Grade 3/6 holosystolic murmur at right sternal border consistent with AS. ABDOMEN: Obese, otherwise benign. MUSCULOSKELETAL: No joint deformity, no clubbing, +1 to +2 edema feet (chronic).  NEUROLOGIC: Mild right facial droop residual (Bell's palsy), speech is fluent. SKIN: Intact,warm,dry.  On limited exam, no rashes. PSYCH: Mood and behavior normal.     Assessment & Plan:     ICD-10-CM   1. Moderate persistent asthma with exacerbation  J45.41     2. Bronchiectasis without complication (HCC)  J47.9     3. Post-viral cough syndrome  R05.8     4. OSA (obstructive sleep apnea)  G47.33      Meds ordered this encounter  Medications   Fluticasone -Umeclidin-Vilant (TRELEGY ELLIPTA ) 100-62.5-25 MCG/ACT AEPB    Sig:  Inhale 1 puff into the lungs daily in the afternoon.    Dispense:  3 each    Lot Number?:   [id number]    Expiration Date?:   08/03/2024    Manufacturer?:   [employer] [[id number]]    NDC:   9826-9112-38 [627739]    Quantity:   3   Discussion:    Asthmatic bronchitis with recent exacerbation Asthmatic bronchitis exacerbated by a viral illness, presenting with a resolving cough. Effective management with a rescue inhaler has been noted. Improvement is evident, and the nebulizer meds is recommended only for severe symptoms. - Continue Trelegy daily. - Use albuterol  as needed for cough or dyspnea. - Use the nebulizer only as needed for severe symptoms. - Provide samples of Trelegy.  Mild post-viral cough syndrome Mild post-viral cough syndrome following a recent viral illness, with a resolving cough expected to persist for 8 to 12 weeks. Tessalon Perles is advised sparingly for severe coughing episodes. - Use Tessalon Perles sparingly for severe coughing episodes.      Advised if symptoms do not improve or worsen, to please contact office for sooner follow up or seek emergency care.    I spent 32 minutes of dedicated to the care of this patient on the date of this encounter to include pre-visit review of records, face-to-face time with the patient discussing conditions above, post visit ordering of testing, clinical documentation with the electronic health record, making appropriate referrals as documented, and communicating necessary findings to members of the patients care team.     C. Leita Sanders, MD Advanced Bronchoscopy [facility]    *This note was generated using voice recognition software/Dragon and/or AI transcription program.  Despite best efforts to proofread, errors can occur which can change the meaning. Any transcriptional errors that result from this process are unintentional and may not be fully corrected at the time of dictation.

## 2023-06-23 NOTE — Patient Instructions (Signed)
 VISIT SUMMARY:  Jessica Kaufman, a 89 year old female with asthmatic bronchitis, visited today due to a mild, occasional cough following a recent viral illness. She has been managing her symptoms with her rescue inhaler and nebulizer as needed.  YOUR PLAN:  - MODERATE PERSISTENT ASTHMA WITH RECENT EXACERBATION: Moderate persistent asthma is a condition where the airways in your lungs become inflamed and produce excess mucus, often triggered by a viral illness. You should continue taking Trelegy daily, use your albuterol  inhaler as needed for cough or shortness of breath, and use your breathing machine only if you experience severe symptoms. We will provide you with samples of Trelegy to help manage costs.  -MILD POST-VIRAL COUGH SYNDROME: Post-viral cough syndrome is a lingering cough that can persist for weeks after a viral illness. You should use Tessalon Perles sparingly, only for severe coughing episodes.  -OBSTRUCTIVE SLEEP APNEA: Compliance is below optimal however this is likely due to the recent exacerbation you had.  Continue using your CPAP regularly.   INSTRUCTIONS:  Continue with your current medications as directed. Use your rescue inhaler and nebulizer as needed. We will provide you with samples of Trelegy. If your symptoms worsen or do not improve, please schedule a follow-up appointment.

## 2023-06-26 DIAGNOSIS — B351 Tinea unguium: Secondary | ICD-10-CM | POA: Diagnosis not present

## 2023-06-26 DIAGNOSIS — I7091 Generalized atherosclerosis: Secondary | ICD-10-CM | POA: Diagnosis not present

## 2023-08-02 DIAGNOSIS — H353211 Exudative age-related macular degeneration, right eye, with active choroidal neovascularization: Secondary | ICD-10-CM | POA: Diagnosis not present

## 2023-08-14 ENCOUNTER — Other Ambulatory Visit: Payer: Self-pay

## 2023-08-14 MED ORDER — LOSARTAN POTASSIUM 50 MG PO TABS: 50.0000 mg | ORAL_TABLET | Freq: Two times a day (BID) | ORAL | 0 refills | Status: DC

## 2023-08-24 DIAGNOSIS — L821 Other seborrheic keratosis: Secondary | ICD-10-CM | POA: Diagnosis not present

## 2023-08-24 DIAGNOSIS — L570 Actinic keratosis: Secondary | ICD-10-CM | POA: Diagnosis not present

## 2023-08-24 DIAGNOSIS — L814 Other melanin hyperpigmentation: Secondary | ICD-10-CM | POA: Diagnosis not present

## 2023-09-06 DIAGNOSIS — G4733 Obstructive sleep apnea (adult) (pediatric): Secondary | ICD-10-CM | POA: Diagnosis not present

## 2023-09-12 NOTE — Progress Notes (Unsigned)
 Cardiology Office Note    Date:  09/12/2023   ID:  Jessica Kaufman, DOB [birth year], MRN [id number]  PCP:  Stephanie Charlene CROME, MD  Cardiologist:  Lonni Hanson, MD  Electrophysiologist:  None   Chief Complaint: Follow-up  History of Present Illness:   Jessica Kaufman is a 89 y.o. female with history of PAF, HFpEF, labile hypertension, mild to moderate aortic stenosis, syncope, history of CVA noted on imaging, PVCs, HLD, mechanical falls, and rheumatoid arthritis who presents for follow-up of HFpEF, PAF, and labile hypertension.   Zio patch in 02/2019 showed a predominant rhythm of sinus with an average rate of 62 bpm (range 41 to 107 bpm in sinus), 39 episodes of paroxysmal SVT lasting up to 10 beats with a maximum rate of 184 bpm, rare PACs and PVCs, brief episodes of junctional rhythm occurred often in the setting of PACs and PVCs, patient triggered events corresponded to sinus rhythm, PACs, PVCs, and transient junctional rhythm.  Echo in 03/2019 showed an EF of 55 to 60%, no regional wall motion abnormalities, normal LV diastolic function parameters, normal RV systolic function and ventricular cavity size, trivial mitral regurgitation, moderate aortic stenosis, and an estimated right atrial pressure of 3 mmHg.  Echo in 05/2020 showed an EF of 50 to 55%, mild LVH, indeterminate LV diastolic function parameters, normal RV systolic function and ventricular cavity size, mild mitral regurgitation, moderate aortic stenosis, and an estimated right atrial pressure of 8 mmHg.  Echo from 01/2021 demonstrated an EF of 60 to 65%, no regional wall motion abnormalities, mild LVH, grade 1 diastolic dysfunction, normal RV systolic function and ventricular cavity size, mild mitral regurgitation, and mild to moderate aortic valve stenosis with a mean gradient of 15 mmHg and a valve area of 1.58 cm.  She was seen in the ED in 07/2021 with episode of LOC occurred while eating breakfast associated with palpitations.   BP was soft at 60/36 with EMS initially noting A-fib with controlled ventricular response.  Subsequent Zio patch from 07/2021 showed a predominant rhythm of sinus with an average rate of 66 bpm (range 50 to 100 bpm in sinus), PAF occurred with an overall burden of 24% with an average ventricular rate of 110 bpm with the longest episode lasting 15 hours and 32 minutes, posttermination pauses lasting up to 3.3 seconds occurred, 128 episode of SVT with the longest lasting 22.9 seconds, and rare PACs and PVCs.  Patient triggered events corresponded to A-fib and PVCs.  Given these findings, she was evaluated by EP in 09/2021 with post termination pauses not felt to be underlying etiology of syncopal episode.  Episode was felt to be primarily vagal mediated.  It was recommended the patient get a Fitbit and carvedilol  was titrated to 6.25 mg twice daily.  She was seen by general cardiology in 12/2021 noting continued labile blood pressures with readings ranging from 91/54 to 206/107.  Furosemide  was decreased to 20 mg daily.  She was maintaining sinus rhythm.  She followed up with the EP in 01/2022 without any interval events, though with continued orthostatic lightheadedness with recommendation to allow for a more permissive blood pressure to 180 mmHg systolic.  She was last seen by general cardiology in 04/2022 and was doing very well from a cardiac perspective.  Lower extremity swelling was improved.  She was wearing compression stockings.  Blood pressure readings were stable.  She underwent echo on 05/25/2022 showed an EF of 60 to 65%, no regional wall motion abnormalities,  grade 1 diastolic dysfunction, normal RV systolic function and ventricular cavity size, mild mitral regurgitation, mild to moderate aortic valve stenosis with a mean gradient of 13.3 mmHg and a valve area of 1.2 cm.  She was last seen in the office in 11/2022 and continued to do very well from a cardiac perspective.  She continued to have labile  hypertension with blood pressure in the 190s to low 200s mmHg systolic before medications that improved to the 150s mmHg systolic thereafter.  She was happy with her current regimen of antihypertensives including losartan  50 mg twice daily, carvedilol  6.25 mg twice daily, furosemide  20 mg at noon with an additional dose as needed in the morning, and hydralazine  25 mg around 4 PM as needed for systolic blood pressure greater than 170 mmHg.  She was subsequently admitted to the hospital in 11/2022 with right-sided weakness and slurred speech lasting for approximately 15 minutes felt to be TIA in etiology.  CT of the head and MRI of the brain showed no acute intracranial abnormality with chronic microvascular ischemic disease and scattered remote lacunar infarcts.  ***   Labs independently reviewed: 11/2022 - A1c 5.4, TC 192, TG 105, HDL 48, LDL 123, potassium 4.6, BUN 16, serum creatinine 0.64, albumin 3.4, AST/ALT normal, Hgb 11.9, PLT 294 09/2022 - TSH normal  Past Medical History:  Diagnosis Date   Aortic valve disease    Mild-moderate aortic stenosis   Asthma    Cancer (HCC)    basal cell    Gout    Hyperlipidemia    Hypertension    Hypertensive heart disease without heart failure    Hypothyroidism    PAF (paroxysmal atrial fibrillation) (HCC)    PVC (premature ventricular contraction)    Rheumatoid arthritis (HCC)    Ventricular premature depolarization     Past Surgical History:  Procedure Laterality Date   BLADDER SURGERY  10/19/2010   BONY PELVIS SURGERY  1990   BREAST BIOPSY Left 1986   CARDIAC SURGERY  2009   PVC   CATARACT EXTRACTION Left 11/15/00   CATARACT EXTRACTION Right    ELBOW SURGERY  06/1990   HIP SURGERY Left 05/28/09   HIP SURGERY Right 06/30/03   HYSTEROSCOPY  06/17/1986   vaginal    Current Medications: No outpatient medications have been marked as taking for the 09/14/23 encounter (Appointment) with Abigail Bernardino HERO, PA-C.    Allergies:   Metronidazole,  Sulindac, and Naproxen   Social History   Socioeconomic History   Marital status: Widowed    Spouse name: Not on file   Number of children: 7   Years of education: 12th   Highest education level: Not on file  Occupational History   Occupation: Retired  Tobacco Use   Smoking status: Never   Smokeless tobacco: Never  Vaping Use   Vaping status: Never Used  Substance and Sexual Activity   Alcohol use: No   Drug use: No   Sexual activity: Not Currently  Other Topics Concern   Not on file  Social History Narrative   Patient lives at home alone.Caffeine Use: 1 cup daily 7 children (1 deceased)   Social Drivers of Corporate investment banker Strain: Not on file  Food Insecurity: No Food Insecurity (11/28/2022)   Hunger Vital Sign    Worried About Running Out of Food in the Last Year: Never true    Ran Out of Food in the Last Year: Never true  Transportation Needs: No Transportation Needs (11/28/2022)  PRAPARE - Administrator, Civil Service (Medical): No    Lack of Transportation (Non-Medical): No  Physical Activity: Not on file  Stress: Not on file  Social Connections: Not on file     Family History:  The patient's family history includes Breast cancer in her daughter; Heart attack in her father and maternal grandmother; Heart failure in her sister; Melanoma in her mother and sister; Stroke in her mother; Uterine cancer in her sister.  ROS:   12-point review of systems is negative unless otherwise noted in the HPI.   EKGs/Labs/Other Studies Reviewed:    Studies reviewed were summarized above. The additional studies were reviewed today:  2D echo 05/25/2022: 1. Left ventricular ejection fraction, by estimation, is 60 to 65%. The  left ventricle has normal function. The left ventricle has no regional  wall motion abnormalities. Left ventricular diastolic parameters are  consistent with Grade I diastolic  dysfunction (impaired relaxation).   2. Right  ventricular systolic function is normal. The right ventricular  size is normal. Tricuspid regurgitation signal is inadequate for assessing  PA pressure.   3. The mitral valve is normal in structure. Mild mitral valve  regurgitation. No evidence of mitral stenosis.   4. The aortic valve is normal in structure. There is severe calcifcation  of the aortic valve. Aortic valve regurgitation is mild. Mild to moderate  aortic valve stenosis. Aortic valve area, by VTI measures 1.20 cm. Aortic  valve mean gradient measures  13.3 mmHg. Aortic valve Vmax measures 2.49 m/s.   5. The inferior vena cava is normal in size with greater than 50%  respiratory variability, suggesting right atrial pressure of 3 mmHg.   Comparison(s): 02/01/21: 60-65%, mild LVH, AS w .  __________   Zio patch 07/2021:   The patient was monitored for 14 days.   The predominant rhythm was sinus with an average rate of 66 bpm in sinus (range 50-100 bpm in sinus).   Paroxysmal atrial fibrillation occurred with an average ventricular rate of 110 bpm (range 59-188 bpm).  The longest episode lasted 15 hours, 32 minutes.  Atrial fibrillation burden was 24%.   Post-termination pauses lasting up to 3.3 seconds occurred.   There were rare PAC's and PVC's.  128 supraventricular runs occurred, lasting up to 22.9 seconds with a maximum rate of 194 bpm.   Patient triggered events correspond to atrial fibrillation and PVC's.   Sinus rhythm with paroxysmal atrial fibrillation and post-termination pauses of up to 3.3 seconds, as detailed above.  PSVT also noted. __________   2D echo 02/01/2021: 1. Left ventricular ejection fraction, by estimation, is 60 to 65%. Left  ventricular ejection fraction by 2D MOD biplane is 61.2 %. The left  ventricle has normal function. The left ventricle has no regional wall  motion abnormalities. There is mild left  ventricular hypertrophy. Left ventricular diastolic parameters are  consistent with Grade  I diastolic dysfunction (impaired relaxation).   2. Right ventricular systolic function is normal. The right ventricular  size is normal.   3. The mitral valve is normal in structure. Mild mitral valve  regurgitation.   4. Mean aortic valve gradient 15 mmHg, peak gradient 22 mmHg, Vmax  1.55m/s, AVA 1.58cm2.. The aortic valve is calcified. Aortic valve  regurgitation is not visualized. Mild to moderate aortic valve stenosis.   5. The inferior vena cava is normal in size with greater than 50%  respiratory variability, suggesting right atrial pressure of 3 mmHg.   Comparison(s):  EF 55-60%; Moderate AS. __________   2D echo 05/08/2020: 1. Left ventricular ejection fraction, by estimation, is 50 to 55%. The  left ventricle has low normal function. Left ventricular endocardial  border not optimally defined to evaluate regional wall motion. There is  mild left ventricular hypertrophy. Left  ventricular diastolic parameters are indeterminate.   2. Right ventricular systolic function is normal. The right ventricular  size is normal.   3. The mitral valve is normal in structure. Mild mitral valve  regurgitation. No evidence of mitral stenosis.   4. The aortic valve was not well visualized. Aortic valve regurgitation  is not visualized. Moderate aortic valve stenosis. Aortic gradient does  not seem accurate. Aortic stenosis seems at least moderate based on valve  morphology and restricted opening.   5. The inferior vena cava is normal in size with <50% respiratory  variability, suggesting right atrial pressure of 8 mmHg. __________   2D echo 04/03/2019: 1. Left ventricular ejection fraction, by estimation, is 55 to 60%. The  left ventricle has normal function. The left ventricle has no regional  wall motion abnormalities. Left ventricular diastolic parameters were  normal.   2. Right ventricular systolic function is normal. The right ventricular  size is normal.   3. The mitral valve is  grossly normal. Trivial mitral valve  regurgitation.   4. The aortic valve was not well visualized. Aortic valve regurgitation  is not visualized. Moderate aortic valve stenosis.   5. The inferior vena cava is normal in size with greater than 50%  respiratory variability, suggesting right atrial pressure of 3 mmHg. __________   Zio patch 02/2019: The patient was monitored for 13 days, 23 hours. The predominant rhythm was sinus with an average rate of 62 bpm (range 41 to 107 bpm in sinus). Rare PACs and PVCs occurred. There were 39 episodes of paroxysmal supraventricular tachycardia lasting up to 10 beats with a maximum rate of 184 bpm. Brief episodes of junctional rhythm occurred, often in the setting of PACs and PVCs. Patient triggered events correspond to sinus rhythm, PACs, and PVCs transient junctional rhythm.   Predominately sinus rhythm with rare PACs and PVCs.  39 episodes of brief PSVT occurred, as well as transient junctional rhythm (often in the setting of PACs and PVCs).   EKG:  EKG is ordered today.  The EKG ordered today demonstrates ***  Recent Labs: 11/28/2022: ALT 16; BUN 16; Creatinine, Ser 0.64; Hemoglobin 11.9; Platelets 294; Potassium 4.6; Sodium 132  Recent Lipid Panel    Component Value Date/Time   CHOL 192 11/29/2022 0453   TRIG 105 11/29/2022 0453   HDL 48 11/29/2022 0453   CHOLHDL 4.0 11/29/2022 0453   VLDL 21 11/29/2022 0453   LDLCALC 123 (H) 11/29/2022 0453    PHYSICAL EXAM:    VS:  There were no vitals taken for this visit.  BMI: There is no height or weight on file to calculate BMI.  Physical Exam  Wt Readings from Last 3 Encounters:  06/23/23 218 lb 9.6 oz (99.2 kg)  05/24/23 218 lb 9.6 oz (99.2 kg)  11/28/22 222 lb 10.6 oz (101 kg)     ASSESSMENT & PLAN:   Labile hypertension: Blood pressure  HFpEF:  Aortic stenosis: Stable, mild to moderate stenosis by echo in 05/2022.  PAF: ***.  CHA2DS2-VASc at least 7 (CHF, HTN, age x 2, CVA x 2,  sex category).   History of syncope:  History of remote CVA and TIA: Prior imaging has demonstrated  remote CVA.  She was admitted to the hospital in 11/2022 with suspected TIA.   {Are you ordering a CV Procedure (e.g. stress test, cath, DCCV, TEE, etc)?   Press F2        :[id number]}     Disposition: F/u with Dr. Mady or an APP in ***.   Medication Adjustments/Labs and Tests Ordered: Current medicines are reviewed at length with the patient today.  Concerns regarding medicines are outlined above. Medication changes, Labs and Tests ordered today are summarized above and listed in the Patient Instructions accessible in Encounters.   Signed, Bernardino Bring, PA-C 09/12/2023 9:02 AM     [facility] [place] [address] [place], [place] [address] [phone]

## 2023-09-14 ENCOUNTER — Ambulatory Visit: Attending: Physician Assistant | Admitting: Physician Assistant

## 2023-09-14 ENCOUNTER — Encounter: Payer: Self-pay | Admitting: Physician Assistant

## 2023-09-14 VITALS — BP 139/60 | HR 61 | Ht 63.0 in | Wt 223.6 lb

## 2023-09-14 DIAGNOSIS — I5032 Chronic diastolic (congestive) heart failure: Secondary | ICD-10-CM | POA: Diagnosis not present

## 2023-09-14 DIAGNOSIS — Z8673 Personal history of transient ischemic attack (TIA), and cerebral infarction without residual deficits: Secondary | ICD-10-CM

## 2023-09-14 DIAGNOSIS — Z87898 Personal history of other specified conditions: Secondary | ICD-10-CM

## 2023-09-14 DIAGNOSIS — I480 Paroxysmal atrial fibrillation: Secondary | ICD-10-CM | POA: Diagnosis not present

## 2023-09-14 DIAGNOSIS — G459 Transient cerebral ischemic attack, unspecified: Secondary | ICD-10-CM | POA: Diagnosis not present

## 2023-09-14 DIAGNOSIS — R0989 Other specified symptoms and signs involving the circulatory and respiratory systems: Secondary | ICD-10-CM | POA: Diagnosis not present

## 2023-09-14 DIAGNOSIS — I350 Nonrheumatic aortic (valve) stenosis: Secondary | ICD-10-CM | POA: Diagnosis not present

## 2023-09-14 MED ORDER — HYDRALAZINE HCL 25 MG PO TABS: ORAL_TABLET | ORAL | Status: DC

## 2023-09-14 NOTE — Patient Instructions (Signed)
 Medication Instructions:  -hydralazine  instructions changed to three times daily as needed for a systolic blood pressure >160 *If you need a refill on your cardiac medications before your next appointment, please call your pharmacy*  Lab Work: Your provider would like for you to have following labs drawn today Lipid, Direct LCL, CMP, CBC.   If you have labs (blood work) drawn today and your tests are completely normal, you will receive your results only by: MyChart Message (if you have MyChart) OR A paper copy in the mail If you have any lab test that is abnormal or we need to change your treatment, we will call you to review the results.  Testing/Procedures: Your physician has requested that you have a carotid duplex. This test is an ultrasound of the carotid arteries in your neck. It looks at blood flow through these arteries that supply the brain with blood.   Allow one hour for this exam.  There are no restrictions or special instructions.  This will take place at [address] [facility] [address] [facility]) [address], [place] [address]  Please note: We ask at that you not bring children with you during ultrasound (echo/ vascular) testing. Due to room size and safety concerns, children are not allowed in the ultrasound rooms during exams. Our front office staff cannot provide observation of children in our lobby area while testing is being conducted. An adult accompanying a patient to their appointment will only be allowed in the ultrasound room at the discretion of the ultrasound technician under special circumstances. We apologize for any inconvenience.   Your physician has requested that you have an echocardiogram. Echocardiography is a painless test that uses sound waves to create images of your heart. It provides your doctor with information about the size and shape of your heart and how well your heart's chambers and valves are working.   You may receive an ultrasound enhancing agent  through an IV if needed to better visualize your heart during the echo. This procedure takes approximately one hour.  There are no restrictions for this procedure.  This will take place at [address] [facility] [address] [facility]) [address], [place] [address]  Please note: We ask at that you not bring children with you during ultrasound (echo/ vascular) testing. Due to room size and safety concerns, children are not allowed in the ultrasound rooms during exams. Our front office staff cannot provide observation of children in our lobby area while testing is being conducted. An adult accompanying a patient to their appointment will only be allowed in the ultrasound room at the discretion of the ultrasound technician under special circumstances. We apologize for any inconvenience.   Follow-Up: At [facility], you and your health needs are our priority.  As part of our continuing mission to provide you with exceptional heart care, our providers are all part of one team.  This team includes your primary Cardiologist (physician) and Advanced Practice Providers or APPs (Physician Assistants and Nurse Practitioners) who all work together to provide you with the care you need, when you need it.  Your next appointment:   3 month(s)  Provider:   You will see one of the following Advanced Practice Providers on your designated Care Team:   Bernardino Bring, PA-C  We recommend signing up for the patient portal called MyChart.  Sign up information is provided on this After Visit Summary.  MyChart is used to connect with patients for Virtual Visits (Telemedicine).  Patients are able to view  lab/test results, encounter notes, upcoming appointments, etc.  Non-urgent messages can be sent to your provider as well.   To learn more about what you can do with MyChart, go to [email or web address].

## 2023-09-15 ENCOUNTER — Ambulatory Visit: Payer: Self-pay | Admitting: Physician Assistant

## 2023-09-15 DIAGNOSIS — Z79899 Other long term (current) drug therapy: Secondary | ICD-10-CM

## 2023-09-15 LAB — COMPREHENSIVE METABOLIC PANEL WITH GFR
ALT: 18 IU/L (ref 0–32)
AST: 24 IU/L (ref 0–40)
Albumin: 4.2 g/dL (ref 3.6–4.6)
Alkaline Phosphatase: 86 IU/L (ref 44–121)
BUN/Creatinine Ratio: 22 (ref 12–28)
BUN: 16 mg/dL (ref 10–36)
Bilirubin Total: 0.3 mg/dL (ref 0.0–1.2)
CO2: 21 mmol/L (ref 20–29)
Calcium: 9.3 mg/dL (ref 8.7–10.3)
Chloride: 97 mmol/L (ref 96–106)
Creatinine, Ser: 0.73 mg/dL (ref 0.57–1.00)
Globulin, Total: 2 g/dL (ref 1.5–4.5)
Glucose: 94 mg/dL (ref 70–99)
Potassium: 4.6 mmol/L (ref 3.5–5.2)
Sodium: 136 mmol/L (ref 134–144)
Total Protein: 6.2 g/dL (ref 6.0–8.5)
eGFR: 75 mL/min/1.73

## 2023-09-15 LAB — LIPID PANEL
Chol/HDL Ratio: 3.2 ratio (ref 0.0–4.4)
Cholesterol, Total: 211 mg/dL — ABNORMAL HIGH (ref 100–199)
HDL: 65 mg/dL
LDL Chol Calc (NIH): 126 mg/dL — ABNORMAL HIGH (ref 0–99)
Triglycerides: 113 mg/dL (ref 0–149)
VLDL Cholesterol Cal: 20 mg/dL (ref 5–40)

## 2023-09-15 LAB — CBC
Hematocrit: 38.3 % (ref 34.0–46.6)
Hemoglobin: 12.5 g/dL (ref 11.1–15.9)
MCH: 31 pg (ref 26.6–33.0)
MCHC: 32.6 g/dL (ref 31.5–35.7)
MCV: 95 fL (ref 79–97)
Platelets: 311 x10E3/uL (ref 150–450)
RBC: 4.03 x10E6/uL (ref 3.77–5.28)
RDW: 13.1 % (ref 11.7–15.4)
WBC: 6.8 x10E3/uL (ref 3.4–10.8)

## 2023-09-15 LAB — LDL CHOLESTEROL, DIRECT: LDL Direct: 124 mg/dL — ABNORMAL HIGH (ref 0–99)

## 2023-09-18 MED ORDER — EZETIMIBE 10 MG PO TABS: 10.0000 mg | ORAL_TABLET | Freq: Every day | ORAL | 11 refills | Status: DC

## 2023-09-20 DIAGNOSIS — J4540 Moderate persistent asthma, uncomplicated: Secondary | ICD-10-CM | POA: Diagnosis not present

## 2023-09-20 DIAGNOSIS — Z9181 History of falling: Secondary | ICD-10-CM | POA: Diagnosis not present

## 2023-09-20 DIAGNOSIS — Z79899 Other long term (current) drug therapy: Secondary | ICD-10-CM | POA: Diagnosis not present

## 2023-09-20 DIAGNOSIS — M109 Gout, unspecified: Secondary | ICD-10-CM | POA: Diagnosis not present

## 2023-09-20 DIAGNOSIS — M810 Age-related osteoporosis without current pathological fracture: Secondary | ICD-10-CM | POA: Diagnosis not present

## 2023-09-20 DIAGNOSIS — M069 Rheumatoid arthritis, unspecified: Secondary | ICD-10-CM | POA: Diagnosis not present

## 2023-09-20 DIAGNOSIS — R32 Unspecified urinary incontinence: Secondary | ICD-10-CM | POA: Diagnosis not present

## 2023-09-20 DIAGNOSIS — I5032 Chronic diastolic (congestive) heart failure: Secondary | ICD-10-CM | POA: Diagnosis not present

## 2023-09-20 DIAGNOSIS — I480 Paroxysmal atrial fibrillation: Secondary | ICD-10-CM | POA: Diagnosis not present

## 2023-09-20 DIAGNOSIS — E039 Hypothyroidism, unspecified: Secondary | ICD-10-CM | POA: Diagnosis not present

## 2023-09-20 DIAGNOSIS — Z1331 Encounter for screening for depression: Secondary | ICD-10-CM | POA: Diagnosis not present

## 2023-09-20 DIAGNOSIS — I471 Supraventricular tachycardia, unspecified: Secondary | ICD-10-CM | POA: Diagnosis not present

## 2023-09-21 DIAGNOSIS — L578 Other skin changes due to chronic exposure to nonionizing radiation: Secondary | ICD-10-CM | POA: Diagnosis not present

## 2023-09-21 DIAGNOSIS — L570 Actinic keratosis: Secondary | ICD-10-CM | POA: Diagnosis not present

## 2023-09-25 ENCOUNTER — Encounter: Payer: Self-pay | Admitting: Pulmonary Disease

## 2023-09-25 ENCOUNTER — Ambulatory Visit: Admitting: Pulmonary Disease

## 2023-09-25 VITALS — BP 126/66 | HR 58 | Temp 98.0°F | Ht 63.0 in | Wt 226.8 lb

## 2023-09-25 DIAGNOSIS — J4541 Moderate persistent asthma with (acute) exacerbation: Secondary | ICD-10-CM

## 2023-09-25 DIAGNOSIS — Z6841 Body Mass Index (BMI) 40.0 and over, adult: Secondary | ICD-10-CM

## 2023-09-25 DIAGNOSIS — J4489 Other specified chronic obstructive pulmonary disease: Secondary | ICD-10-CM | POA: Diagnosis not present

## 2023-09-25 DIAGNOSIS — G4733 Obstructive sleep apnea (adult) (pediatric): Secondary | ICD-10-CM | POA: Diagnosis not present

## 2023-09-25 DIAGNOSIS — I350 Nonrheumatic aortic (valve) stenosis: Secondary | ICD-10-CM

## 2023-09-25 DIAGNOSIS — J479 Bronchiectasis, uncomplicated: Secondary | ICD-10-CM

## 2023-09-25 NOTE — Patient Instructions (Signed)
 VISIT SUMMARY:  You came in today for a follow-up visit to manage your asthmatic bronchitis, obstructive sleep apnea, and chronic postnasal drip. We discussed your current symptoms and reviewed your treatment plan to ensure you are on the right track.  YOUR PLAN:  -ASTHMATIC BRONCHITIS: Asthmatic bronchitis is a condition where the airways in your lungs are inflamed and produce mucus, leading to coughing and difficulty breathing. Your asthma appears well-controlled as you haven't needed your albuterol  inhaler recently. Continue using your Trelegy inhaler every morning, and start taking Mucinex  to help thin the mucus and reduce your cough. Make sure to stay well-hydrated.  -OBSTRUCTIVE SLEEP APNEA: Obstructive sleep apnea is a condition where your breathing stops and starts during sleep due to blocked airways. You are managing this well with your CPAP device. Continue using your CPAP as prescribed.  -CHRONIC POSTNASAL DRIP: Chronic postnasal drip occurs when excess mucus from your nose drips down the back of your throat, causing a cough. You found Allegra helpful in the past. We discussed trying Zyrtec at bedtime to help manage your symptoms.  INSTRUCTIONS:  Please continue with your current treatments and start the new medications as discussed. If you experience any new or worsening symptoms, or if you have any questions, please contact our office. We will schedule a follow-up appointment to monitor your progress.

## 2023-09-25 NOTE — Progress Notes (Addendum)
 Subjective:    Patient ID: Jessica Kaufman, female    DOB: 1925-03-04, 89 y.o.   MRN: [id number]  Patient Care Team: Stephanie Charlene CROME, MD as PCP - General (Family Medicine) End, Lonni, MD as PCP - Cardiology (Cardiology) Tamea Dedra CROME, MD as Consulting Physician (Pulmonary Disease)  Chief Complaint  Patient presents with   Asthma    Cough in the morning.     BACKGROUND/INTERVAL:Jessica Kaufman is a 89 year old lifelong never smoker who presents for follow-up on the issue of asthma and asthmatic bronchitis. She was last seen here on 23 June 2023.  She is on Trelegy Ellipta  as maintenance  HPI Discussed the use of AI scribe software for clinical note transcription with the patient, who gave verbal consent to proceed.  History of Present Illness   Jessica Kaufman is a 89 year old female with asthmatic bronchitis who presents for follow up.  She manages her asthmatic bronchitis with a daily Trelegy inhaler and has not needed her albuterol  inhaler recently. She experiences a persistent morning cough, which can sometimes resolve but may recur, occasionally producing white sputum attributed to her asthma.  She has a history of obstructive sleep apnea and uses a CPAP device effectively.  She experiences dizziness and imbalance, which she attributes to a vestibular issue involving improper fluid movement between the ears.  She has been evaluated by ENT for this issue.  This has impacted her social interactions, as noted during a recent family gathering.  She has received her COVID and flu vaccinations and tries to maintain active, including walking and attending social events, such as a recent birthday party for her sister.  She has a history of using Allegra for allergies, which she found helpful in the past. She drinks water regularly to stay hydrated.     She is on CPAP for sleep apnea using on AutoSet between 4 cm to 20 cm H2O.  EPR 3.  Compliance is at 100% for the month with  77% over 4 hours usage.  7 days under the 4 hours usage due to her upper respiratory illness towards the end of August.  Pressures during use ranged between 6 to 7 cm H2O.  Residual AHI of 2.6   Review of Systems A 10 point review of systems was performed and it is as noted above otherwise negative.   Patient Active Problem List   Diagnosis Date Noted   Bronchiectasis without complication (HCC) 05/24/2023   Obesity, morbid (HCC) 05/24/2023   TIA (transient ischemic attack) 11/28/2022   Hypertension    Asthma    Chronic heart failure with preserved ejection fraction (HFpEF) (HCC) 03/11/2021   Edema 11/05/2020   Atrial fibrillation with RVR (HCC) 05/07/2020   Gout    Hypothyroidism    Rheumatoid arthritis (HCC)    Syncope and collapse    Elevated troponin    Hypomagnesemia    Atypical chest pain 10/03/2019   Nonrheumatic aortic valve stenosis 10/03/2019   Palpitations 04/04/2019   Dizziness 04/04/2019   PSVT (paroxysmal supraventricular tachycardia) 04/04/2019   Near syncope 02/20/2019   Labile hypertension 12/12/2017   Paroxysmal atrial fibrillation (HCC) 12/12/2017   Aortic valve disease 12/12/2017    Social History   Tobacco Use   Smoking status: Never   Smokeless tobacco: Never  Substance Use Topics   Alcohol use: No    Allergies  Allergen Reactions   Metronidazole Other (See Comments)    RASH   Sulindac Other (See Comments)  UNKNOWN   Naproxen Rash    Current Meds  Medication Sig   acetaminophen  (TYLENOL ) 650 MG CR tablet Take 650 mg by mouth every 8 (eight) hours as needed for pain.   albuterol  (VENTOLIN  HFA) 108 (90 Base) MCG/ACT inhaler Inhale 2 puffs into the lungs every 4 (four) hours as needed.   apixaban  (ELIQUIS ) 5 MG TABS tablet Take 5 mg by mouth 2 (two) times daily.    b complex vitamins tablet Take 1 tablet by mouth daily.   Bioflavonoid Products (BIOFLEX) TABS Take 1 tablet by mouth in the morning and at bedtime.   carvedilol  (COREG ) 6.25 MG  tablet Take 1 tablet (6.25 mg total) by mouth 2 (two) times daily.   Cholecalciferol  (VITAMIN D3) 1000 UNITS CAPS Take 1 capsule by mouth daily.   Coenzyme Q10 (COQ10) 100 MG CAPS Take 1 capsule by mouth daily.   ezetimibe  (ZETIA ) 10 MG tablet Take 1 tablet (10 mg total) by mouth daily.   famotidine  (PEPCID ) 20 MG tablet Take 20 mg by mouth daily.   fexofenadine (ALLEGRA) 180 MG tablet Take 180 mg by mouth as needed.   Fluticasone -Umeclidin-Vilant (TRELEGY ELLIPTA ) 100-62.5-25 MCG/ACT AEPB Inhale 1 puff into the lungs daily.   Fluticasone -Umeclidin-Vilant (TRELEGY ELLIPTA ) 100-62.5-25 MCG/ACT AEPB Inhale 1 puff into the lungs daily in the afternoon.   furosemide  (LASIX ) 40 MG tablet Take Furosemide  40 mg one tablet every am & one-half (20 mg) tablet at noon./PLEASE CALL OFFICE TO SCHEDULE APPOINTMENT PRIOR TO NEXT REFILL (Patient taking differently: Take 20 mg by mouth 2 (two) times daily.)   gabapentin  (NEURONTIN ) 600 MG tablet Take 600 mg by mouth 2 (two) times daily.   Ginger , Zingiber officinalis, (GINGER  ROOT PO) Take by mouth daily.   guaiFENesin  (MUCINEX ) 600 MG 12 hr tablet Take 600 mg by mouth daily.   hydrALAZINE  (APRESOLINE ) 25 MG tablet TAKE 1 TABLET (25 MG)  BY MOUTH UP TO THREE TIMES A DAY AS NEEDED FOR SYSTOLIC BP OF 160 OR GREATER   ipratropium-albuterol  (DUONEB) 0.5-2.5 (3) MG/3ML SOLN Take 3 mLs by nebulization every 6 (six) hours as needed.   levothyroxine  (SYNTHROID , LEVOTHROID) 50 MCG tablet Take 1 tablet by mouth daily.   losartan  (COZAAR ) 50 MG tablet Take 1 tablet (50 mg total) by mouth 2 (two) times daily.   Multiple Vitamins-Minerals (CENTRUM SILVER PO) Take 1 tablet by mouth daily.   Multiple Vitamins-Minerals (PRESERVISION AREDS 2 PO) Take 1 capsule by mouth 2 (two) times daily.    MYRBETRIQ  25 MG TB24 tablet Take 25 mg by mouth daily.   Omega-3 Fatty Acids (FISH OIL) 1000 MG CAPS Take 1 capsule by mouth in the morning and at bedtime.   pravastatin (PRAVACHOL) 10 MG  tablet Take 10 mg by mouth at bedtime.   PREMARIN vaginal cream Place 1 Applicatorful vaginally in the morning and at bedtime.   psyllium (METAMUCIL) 58.6 % powder Take 1 packet by mouth daily.   TURMERIC PO Take by mouth daily.    Immunization History  Administered Date(s) Administered   Fluad Quad(high Dose 65+) 10/05/2022   INFLUENZA, HIGH DOSE SEASONAL PF 10/25/2018, 09/15/2023   Influenza, Quadrivalent, Recombinant, Inj, Pf 10/13/2016, 10/02/2017, 12/01/2020   Influenza-Unspecified 09/26/2021, 11/04/2022   Moderna SARS-COV2 Booster Vaccination 01/08/2020   Moderna Sars-Covid-2 Vaccination 01/19/2019, 02/15/2019   PNEUMOCOCCAL CONJUGATE-20 03/21/2023   Pfizer Covid-19 Vaccine Bivalent Booster 69yrs & up 09/22/2023   Pneumococcal Polysaccharide-23 06/23/2015   RSV,unspecified 10/26/2021   Tdap 03/19/2021, 05/04/2021   Zoster Recombinant(Shingrix) 05/17/2017,  07/24/2017        Objective:     BP 126/66   Pulse (!) 58   Temp 98 F (36.7 C) (Temporal)   Ht 5' 3 (1.6 m)   Wt 226 lb 12.8 oz (102.9 kg)   SpO2 96%   BMI 40.18 kg/m   SpO2: 96 %  GENERAL: Obese woman, no acute distress.  No conversational dyspnea.  Ambulates with walker. HEAD: Normocephalic, atraumatic.  Slight right facial droop noted from Bell's palsy. EYES: Pupils equal, round, reactive to light.  No scleral icterus.  MOUTH:  NECK: Supple. No thyromegaly. Trachea midline. No JVD.  No adenopathy. PULMONARY: Good air entry bilaterally.  No adventitious sounds. CARDIOVASCULAR: S1 and S2. Regular rate and rhythm.  Grade 3/6 holosystolic murmur at right sternal border consistent with AS. ABDOMEN: Obese, otherwise benign. MUSCULOSKELETAL: No joint deformity, no clubbing, +1 to +2 edema feet (chronic).  NEUROLOGIC: Mild right facial droop residual (Bell's palsy), speech is fluent. SKIN: Intact,warm,dry.  On limited exam, no rashes. PSYCH: Mood and behavior normal.   Assessment & Plan:     ICD-10-CM   1.  Moderate persistent asthma with exacerbation  J45.41     2. Bronchiectasis without complication (HCC)  J47.9     3. OSA (obstructive sleep apnea)  G47.33     4. Aortic valve stenosis, etiology of cardiac valve disease unspecified  I35.0     5. Obesity, morbid (HCC)  E66.01      Discussion:    Asthmatic bronchitis Chronic asthmatic bronchitis with intermittent morning cough, sometimes producing white sputum, likely related to asthma. No recent use of albuterol  inhaler, indicating well-controlled asthma. Continues to use Trelegy inhaler every morning. - Continue Trelegy inhaler every morning - Start Mucinex  to thin mucus and reduce cough - Ensure adequate hydration  Obstructive sleep apnea Obstructive sleep apnea managed with CPAP. Reports doing well with CPAP therapy, and compliance is confirmed by reports. - Continue CPAP therapy  Chronic postnasal drip Chronic postnasal drip contributing to morning cough. Previous use of Allegra was beneficial. Discussed potential use of allergy medications such as Claritin  or Zyrtec to manage symptoms. Claritin  is recommended for daytime use due to its non-sedative properties, while Zyrtec can be taken at bedtime. - Consider starting Zyrtec at bedtime to manage postnasal drip      Advised if symptoms do not improve or worsen, to please contact office for sooner follow up or seek emergency care.    I spent 34 minutes of dedicated to the care of this patient on the date of this encounter to include pre-visit review of records, face-to-face time with the patient discussing conditions above, post visit ordering of testing, clinical documentation with the electronic health record, making appropriate referrals as documented, and communicating necessary findings to members of the patients care team.     C. Leita Sanders, MD Advanced Bronchoscopy PCCM [facility]    *This note was generated using voice recognition software/Dragon  and/or AI transcription program.  Despite best efforts to proofread, errors can occur which can change the meaning. Any transcriptional errors that result from this process are unintentional and may not be fully corrected at the time of dictation.

## 2023-09-27 DIAGNOSIS — H353211 Exudative age-related macular degeneration, right eye, with active choroidal neovascularization: Secondary | ICD-10-CM | POA: Diagnosis not present

## 2023-10-16 ENCOUNTER — Other Ambulatory Visit: Payer: Self-pay

## 2023-10-18 ENCOUNTER — Other Ambulatory Visit: Payer: Self-pay

## 2023-10-18 MED ORDER — FUROSEMIDE 20 MG PO TABS: 20.0000 mg | ORAL_TABLET | Freq: Two times a day (BID) | ORAL | 3 refills | Status: DC

## 2023-10-18 NOTE — Telephone Encounter (Signed)
 At last office visit 09/14/2023 with Bernardino Bring, PA pt's medication furosemide  40 mg was decreased to 20 mg BID, this was not changed. Please clarify how pt is supposed to take furosemide  and the strength? Please address

## 2023-10-19 ENCOUNTER — Telehealth: Payer: Self-pay | Admitting: Physician Assistant

## 2023-10-19 DIAGNOSIS — L570 Actinic keratosis: Secondary | ICD-10-CM | POA: Diagnosis not present

## 2023-10-19 MED ORDER — FUROSEMIDE 40 MG PO TABS: 20.0000 mg | ORAL_TABLET | Freq: Two times a day (BID) | ORAL | 3 refills | Status: DC

## 2023-10-19 NOTE — Telephone Encounter (Signed)
 Pt c/o medication issue:  1. Name of Medication: furosemide  (LASIX ) 20 MG tablet   2. How are you currently taking this medication (dosage and times per day)? As written  3. Are you having a reaction (difficulty breathing--STAT)? no  4. What is your medication issue? Pharmacy called to verify the directions and if so the amounts needs to be 180 please Advise

## 2023-10-19 NOTE — Telephone Encounter (Signed)
 Spoke with the patient's daughter, who confirmed that the patient is currently taking Lasix  40 mg half a tablet (20 mg) by mouth twice daily. She stated that she would like to continue with the 40 mg tablets and cut them in case the dose needs to be increased in the future.  Refill sent as requested

## 2023-10-23 ENCOUNTER — Other Ambulatory Visit: Payer: Self-pay

## 2023-10-26 ENCOUNTER — Ambulatory Visit: Attending: Physician Assistant

## 2023-10-26 ENCOUNTER — Ambulatory Visit (INDEPENDENT_AMBULATORY_CARE_PROVIDER_SITE_OTHER)

## 2023-10-26 DIAGNOSIS — I350 Nonrheumatic aortic (valve) stenosis: Secondary | ICD-10-CM | POA: Diagnosis not present

## 2023-10-26 DIAGNOSIS — G459 Transient cerebral ischemic attack, unspecified: Secondary | ICD-10-CM

## 2023-10-26 DIAGNOSIS — I480 Paroxysmal atrial fibrillation: Secondary | ICD-10-CM | POA: Diagnosis not present

## 2023-10-26 LAB — ECHOCARDIOGRAM COMPLETE
AV Mean grad: 12 mmHg
AV Peak grad: 22.1 mmHg
Ao pk vel: 2.35 m/s
Area-P 1/2: 4.68 cm2

## 2023-10-26 MED ORDER — LOSARTAN POTASSIUM 50 MG PO TABS: 50.0000 mg | ORAL_TABLET | Freq: Two times a day (BID) | ORAL | 0 refills | Status: DC

## 2023-10-31 ENCOUNTER — Ambulatory Visit: Admitting: Physician Assistant

## 2023-11-22 DIAGNOSIS — H353211 Exudative age-related macular degeneration, right eye, with active choroidal neovascularization: Secondary | ICD-10-CM | POA: Diagnosis not present

## 2023-12-06 ENCOUNTER — Other Ambulatory Visit: Payer: Self-pay

## 2023-12-12 MED ORDER — CARVEDILOL 6.25 MG PO TABS: 6.2500 mg | ORAL_TABLET | Freq: Two times a day (BID) | ORAL | 2 refills | Status: AC

## 2023-12-14 NOTE — Progress Notes (Unsigned)
 Cardiology Office Note    Date:  12/19/2023   ID:  Jessica Kaufman, DOB 07/14/1925, MRN [id number]  PCP:  Stephanie Charlene CROME, MD  Cardiologist:  Lonni Hanson, MD  Electrophysiologist:  None   Chief Complaint: Follow up  History of Present Illness:   Jessica Kaufman is a 89 y.o. female with history of PAF, HFpEF, labile hypertension, mild to moderate aortic stenosis, syncope, history of CVA noted on imaging, PVCs, HLD, mechanical falls, and rheumatoid arthritis who presents for follow-up of echo.  Zio patch in 02/2019 showed a predominant rhythm of sinus with an average rate of 62 bpm (range 41 to 107 bpm in sinus), 39 episodes of paroxysmal SVT lasting up to 10 beats with a maximum rate of 184 bpm, rare PACs and PVCs, brief episodes of junctional rhythm occurred often in the setting of PACs and PVCs, patient triggered events corresponded to sinus rhythm, PACs, PVCs, and transient junctional rhythm.  Echo in 03/2019 showed an EF of 55 to 60%, no regional wall motion abnormalities, normal LV diastolic function parameters, normal RV systolic function and ventricular cavity size, trivial mitral regurgitation, moderate aortic stenosis, and an estimated right atrial pressure of 3 mmHg.  Echo in 05/2020 showed an EF of 50 to 55%, mild LVH, indeterminate LV diastolic function parameters, normal RV systolic function and ventricular cavity size, mild mitral regurgitation, moderate aortic stenosis, and an estimated right atrial pressure of 8 mmHg.  Echo from 01/2021 demonstrated an EF of 60 to 65%, no regional wall motion abnormalities, mild LVH, grade 1 diastolic dysfunction, normal RV systolic function and ventricular cavity size, mild mitral regurgitation, and mild to moderate aortic valve stenosis with a mean gradient of 15 mmHg and a valve area of 1.58 cm.  She was seen in the ED in 07/2021 with episode of LOC occurred while eating breakfast associated with palpitations.  BP was soft at 60/36 with EMS  initially noting A-fib with controlled ventricular response.  Subsequent Zio patch from 07/2021 showed a predominant rhythm of sinus with an average rate of 66 bpm (range 50 to 100 bpm in sinus), PAF occurred with an overall burden of 24% with an average ventricular rate of 110 bpm with the longest episode lasting 15 hours and 32 minutes, posttermination pauses lasting up to 3.3 seconds occurred, 128 episode of SVT with the longest lasting 22.9 seconds, and rare PACs and PVCs.  Patient triggered events corresponded to A-fib and PVCs.  Given these findings, she was evaluated by EP in 09/2021 with post termination pauses not felt to be underlying etiology of syncopal episode.  Episode was felt to be primarily vagal mediated.  It was recommended the patient get a Fitbit and carvedilol  was titrated to 6.25 mg twice daily.  She was seen by general cardiology in 12/2021 noting continued labile blood pressures with readings ranging from 91/54 to 206/107.  Furosemide  was decreased to 20 mg daily.  She was maintaining sinus rhythm.  She followed up with the EP in 01/2022 without any interval events, though with continued orthostatic lightheadedness with recommendation to allow for a more permissive blood pressure to 180 mmHg systolic.  She was last seen by general cardiology in 04/2022 and was doing very well from a cardiac perspective.  Lower extremity swelling was improved.  She was wearing compression stockings.  Blood pressure readings were stable.  Echo on 05/25/2022 showed an EF of 60 to 65%, no regional wall motion abnormalities, grade 1 diastolic dysfunction, normal RV  systolic function and ventricular cavity size, mild mitral regurgitation, mild to moderate aortic valve stenosis with a mean gradient of 13.3 mmHg and a valve area of 1.2 cm.  She was seen in the office in 11/2022 continued to have labile hypertension with blood pressure in the 190s to low 200s mmHg systolic before medications that improved to the 150s  mmHg systolic thereafter.  She was happy with her regimen of antihypertensives at that time, including losartan  50 mg twice daily, carvedilol  6.25 mg twice daily, furosemide  20 mg at noon with an additional dose as needed in the morning, and hydralazine  25 mg around 4 PM as needed for systolic blood pressure greater than 170 mmHg.   She was admitted to the hospital in 11/2022 with right-sided weakness and slurred speech lasting for approximately 15 minutes felt to be TIA in etiology.  CT of the head and MRI of the brain showed no acute intracranial abnormality with chronic microvascular ischemic disease and scattered remote lacunar infarcts.  She was last seen in the office in 09/2023 continuing to note labile blood pressures that were largely stable.  She was continued on carvedilol  6.25 mg twice daily, losartan  50 mg twice daily, furosemide  20 mg twice daily, and hydralazine  25 mg as needed for systolic blood pressure greater than 160 mmHg.  Echo in 10/2023 showed an EF of 60 to 65%, grade 1 diastolic dysfunction, normal RV systolic function, mild mitral regurgitation, severely calcified aortic valve with trivial insufficiency and mild stenosis with a mean gradient of 12 mmHg, and a normal CVP.  Carotid artery ultrasound in 10/2023 showed 1 to 39% bilateral ICA stenosis.  She comes in accompanied by her daughter today and is without symptoms of angina or cardiac decompensation.  She does feel like at times her morning blood pressures are running too low for her in the low 100s to 1 teens mmHg systolic with a very reading in the 90s mmHg systolic after taking morning medications.  With these BP readings she notes an increase in fatigue and generalized unsteadiness.  Continues to note labile blood pressure readings.  She continues to deal with vision changes and unsteady gait.  No falls.  No symptoms concerning for bleeding.  No chest pain or progressive dyspnea.  No significant lower extremity swelling,  wearing compression socks.  Taking as needed hydralazine  several days per week.   Labs independently reviewed: 09/2023 - BUN 16, serum creatinine 0.73, potassium 4.6, albumin 4.2, AST/ALT normal, Hgb 12.5, PLT 311, TC 211, TG 113, HDL 65, LDL 126, direct LDL 124 11/2022 - A1c 5.4, Hgb 11.9, PLT 294 09/2022 - TSH normal  Past Medical History:  Diagnosis Date   Aortic valve disease    Mild-moderate aortic stenosis   Asthma    Cancer (HCC)    basal cell    Gout    Hyperlipidemia    Hypertension    Hypertensive heart disease without heart failure    Hypothyroidism    PAF (paroxysmal atrial fibrillation) (HCC)    PVC (premature ventricular contraction)    Rheumatoid arthritis (HCC)    Ventricular premature depolarization     Past Surgical History:  Procedure Laterality Date   BLADDER SURGERY  10/19/2010   BONY PELVIS SURGERY  1990   BREAST BIOPSY Left 1986   CARDIAC SURGERY  2009   PVC   CATARACT EXTRACTION Left 11/15/00   CATARACT EXTRACTION Right    ELBOW SURGERY  06/1990   HIP SURGERY Left 05/28/09   HIP  SURGERY Right 06/30/03   HYSTEROSCOPY  06/17/1986   vaginal    Current Medications: Active Medications[1]  Allergies:   Metronidazole, Sulindac, and Naproxen   Social History   Socioeconomic History   Marital status: Widowed    Spouse name: Not on file   Number of children: 7   Years of education: 12th   Highest education level: Not on file  Occupational History   Occupation: Retired  Tobacco Use   Smoking status: Never   Smokeless tobacco: Never  Vaping Use   Vaping status: Never Used  Substance and Sexual Activity   Alcohol use: No   Drug use: No   Sexual activity: Not Currently  Other Topics Concern   Not on file  Social History Narrative   Patient lives at home alone.Caffeine Use: 1 cup daily 7 children (1 deceased)   Social Drivers of Health   Tobacco Use: Low Risk (12/19/2023)   Patient History    Smoking Tobacco Use: Never    Smokeless  Tobacco Use: Never    Passive Exposure: Not on file  Financial Resource Strain: Not on file  Food Insecurity: No Food Insecurity (11/28/2022)   Hunger Vital Sign    Worried About Running Out of Food in the Last Year: Never true    Ran Out of Food in the Last Year: Never true  Transportation Needs: No Transportation Needs (11/28/2022)   PRAPARE - Administrator, Civil Service (Medical): No    Lack of Transportation (Non-Medical): No  Physical Activity: Not on file  Stress: Not on file  Social Connections: Not on file  Depression (EYV7-0): Not on file  Alcohol Screen: Not on file  Housing: Low Risk (11/28/2022)   Housing    Last Housing Risk Score: 0  Utilities: Not At Risk (11/28/2022)   AHC Utilities    Threatened with loss of utilities: No  Health Literacy: Not on file     Family History:  The patient's family history includes Breast cancer in her daughter; Heart attack in her father and maternal grandmother; Heart failure in her sister; Melanoma in her mother and sister; Stroke in her mother; Uterine cancer in her sister.  ROS:   12-point review of systems is negative unless otherwise noted in the HPI.   EKGs/Labs/Other Studies Reviewed:    Studies reviewed were summarized above. The additional studies were reviewed today:  2D echo 10/26/2023: 1. Technically difficult study.   2. Left ventricular ejection fraction, by estimation, is 60 to 65%. The  left ventricle has normal function. Left ventricular endocardial border  not optimally defined to evaluate regional wall motion. Left ventricular  diastolic parameters are consistent  with Grade I diastolic dysfunction (impaired relaxation).   3. Right ventricular systolic function is grossly normal. The right  ventricular size is not well visualized.   4. The mitral valve is normal in structure. Mild mitral valve  regurgitation. No evidence of mitral stenosis.   5. The aortic valve has an indeterminant number of  cusps. There is severe  calcifcation of the aortic valve. Aortic valve regurgitation is trivial.  Mild aortic valve stenosis. Aortic valve mean gradient measures 12.0 mmHg.  Aortic valve Vmax measures 2.35   m/s. Unable to calculate AVA or DVI due to absence of LVOT doppler  tracing.   6. The inferior vena cava is normal in size with greater than 50%  respiratory variability, suggesting right atrial pressure of 3 mmHg.   Comparison(s): A prior study was performed  on 05/25/2022. No significant  change from prior study.  __________  Carotid artery ultrasound 10/26/2023: Summary:  Right Carotid: Velocities in the right ICA are consistent with a 1-39%  stenosis.  Non-hemodynamically significant plaque <50% noted in the  CCA. The extracranial vessels were near-normal with only minimal  wall thickening or plaque.   Left Carotid: Velocities in the left ICA are consistent with a 1-39%  stenosis.  Non-hemodynamically significant plaque <50% noted in the  CCA. The extracranial vessels were near-normal with only minimal wall thickening or plaque.   Vertebrals:  Bilateral vertebral arteries demonstrate antegrade flow.  Subclavians: Left subclavian artery flow was disturbed. Normal flow  hemodynamics were seen in the right subclavian artery.  __________  2D echo 05/25/2022: 1. Left ventricular ejection fraction, by estimation, is 60 to 65%. The  left ventricle has normal function. The left ventricle has no regional  wall motion abnormalities. Left ventricular diastolic parameters are  consistent with Grade I diastolic  dysfunction (impaired relaxation).   2. Right ventricular systolic function is normal. The right ventricular  size is normal. Tricuspid regurgitation signal is inadequate for assessing  PA pressure.   3. The mitral valve is normal in structure. Mild mitral valve  regurgitation. No evidence of mitral stenosis.   4. The aortic valve is normal in structure. There is severe  calcifcation  of the aortic valve. Aortic valve regurgitation is mild. Mild to moderate  aortic valve stenosis. Aortic valve area, by VTI measures 1.20 cm. Aortic  valve mean gradient measures  13.3 mmHg. Aortic valve Vmax measures 2.49 m/s.   5. The inferior vena cava is normal in size with greater than 50%  respiratory variability, suggesting right atrial pressure of 3 mmHg.   Comparison(s): 02/01/21: 60-65%, mild LVH, AS w .  __________   Zio patch 07/2021:   The patient was monitored for 14 days.   The predominant rhythm was sinus with an average rate of 66 bpm in sinus (range 50-100 bpm in sinus).   Paroxysmal atrial fibrillation occurred with an average ventricular rate of 110 bpm (range 59-188 bpm).  The longest episode lasted 15 hours, 32 minutes.  Atrial fibrillation burden was 24%.   Post-termination pauses lasting up to 3.3 seconds occurred.   There were rare PAC's and PVC's.  128 supraventricular runs occurred, lasting up to 22.9 seconds with a maximum rate of 194 bpm.   Patient triggered events correspond to atrial fibrillation and PVC's.   Sinus rhythm with paroxysmal atrial fibrillation and post-termination pauses of up to 3.3 seconds, as detailed above.  PSVT also noted. __________   2D echo 02/01/2021: 1. Left ventricular ejection fraction, by estimation, is 60 to 65%. Left  ventricular ejection fraction by 2D MOD biplane is 61.2 %. The left  ventricle has normal function. The left ventricle has no regional wall  motion abnormalities. There is mild left  ventricular hypertrophy. Left ventricular diastolic parameters are  consistent with Grade I diastolic dysfunction (impaired relaxation).   2. Right ventricular systolic function is normal. The right ventricular  size is normal.   3. The mitral valve is normal in structure. Mild mitral valve  regurgitation.   4. Mean aortic valve gradient 15 mmHg, peak gradient 22 mmHg, Vmax  1.33m/s, AVA 1.58cm2.. The aortic  valve is calcified. Aortic valve  regurgitation is not visualized. Mild to moderate aortic valve stenosis.   5. The inferior vena cava is normal in size with greater than 50%  respiratory variability, suggesting right  atrial pressure of 3 mmHg.   Comparison(s): EF 55-60%; Moderate AS. __________   2D echo 05/08/2020: 1. Left ventricular ejection fraction, by estimation, is 50 to 55%. The  left ventricle has low normal function. Left ventricular endocardial  border not optimally defined to evaluate regional wall motion. There is  mild left ventricular hypertrophy. Left  ventricular diastolic parameters are indeterminate.   2. Right ventricular systolic function is normal. The right ventricular  size is normal.   3. The mitral valve is normal in structure. Mild mitral valve  regurgitation. No evidence of mitral stenosis.   4. The aortic valve was not well visualized. Aortic valve regurgitation  is not visualized. Moderate aortic valve stenosis. Aortic gradient does  not seem accurate. Aortic stenosis seems at least moderate based on valve  morphology and restricted opening.   5. The inferior vena cava is normal in size with <50% respiratory  variability, suggesting right atrial pressure of 8 mmHg. __________   2D echo 04/03/2019: 1. Left ventricular ejection fraction, by estimation, is 55 to 60%. The  left ventricle has normal function. The left ventricle has no regional  wall motion abnormalities. Left ventricular diastolic parameters were  normal.   2. Right ventricular systolic function is normal. The right ventricular  size is normal.   3. The mitral valve is grossly normal. Trivial mitral valve  regurgitation.   4. The aortic valve was not well visualized. Aortic valve regurgitation  is not visualized. Moderate aortic valve stenosis.   5. The inferior vena cava is normal in size with greater than 50%  respiratory variability, suggesting right atrial pressure of 3  mmHg. __________   Zio patch 02/2019: The patient was monitored for 13 days, 23 hours. The predominant rhythm was sinus with an average rate of 62 bpm (range 41 to 107 bpm in sinus). Rare PACs and PVCs occurred. There were 39 episodes of paroxysmal supraventricular tachycardia lasting up to 10 beats with a maximum rate of 184 bpm. Brief episodes of junctional rhythm occurred, often in the setting of PACs and PVCs. Patient triggered events correspond to sinus rhythm, PACs, and PVCs transient junctional rhythm.   Predominately sinus rhythm with rare PACs and PVCs.  39 episodes of brief PSVT occurred, as well as transient junctional rhythm (often in the setting of PACs and PVCs).   EKG:  EKG is not ordered today.    Recent Labs: 09/14/2023: ALT 18; BUN 16; Creatinine, Ser 0.73; Hemoglobin 12.5; Platelets 311; Potassium 4.6; Sodium 136  Recent Lipid Panel    Component Value Date/Time   CHOL 211 (H) 09/14/2023 1459   TRIG 113 09/14/2023 1459   HDL 65 09/14/2023 1459   CHOLHDL 3.2 09/14/2023 1459   CHOLHDL 4.0 11/29/2022 0453   VLDL 21 11/29/2022 0453   LDLCALC 126 (H) 09/14/2023 1459   LDLDIRECT 124 (H) 09/14/2023 1459    PHYSICAL EXAM:    VS:  BP 130/70 (BP Location: Left Arm, Patient Position: Sitting, Cuff Size: Normal)   Pulse 62   Ht 5' 3 (1.6 m)   Wt 227 lb 9.6 oz (103.2 kg)   SpO2 95%   BMI 40.32 kg/m   BMI: Body mass index is 40.32 kg/m.  Physical Exam Vitals reviewed.  Constitutional:      Appearance: She is well-developed.  HENT:     Head: Normocephalic and atraumatic.  Eyes:     General:        Right eye: No discharge.  Left eye: No discharge.  Cardiovascular:     Rate and Rhythm: Normal rate and regular rhythm.     Heart sounds: S1 normal and S2 normal. Heart sounds not distant. No midsystolic click and no opening snap. Murmur heard.     Systolic murmur is present with a grade of 2/6 at the upper right sternal border.     No friction rub.   Pulmonary:     Effort: Pulmonary effort is normal. No respiratory distress.     Breath sounds: Normal breath sounds. No decreased breath sounds, wheezing, rhonchi or rales.  Musculoskeletal:     Cervical back: Normal range of motion.     Comments: Compression socks in place.  Skin:    General: Skin is warm and dry.     Nails: There is no clubbing.  Neurological:     Mental Status: She is alert and oriented to person, place, and time.  Psychiatric:        Speech: Speech normal.        Behavior: Behavior normal.        Thought Content: Thought content normal.        Judgment: Judgment normal.     Wt Readings from Last 3 Encounters:  12/19/23 227 lb 9.6 oz (103.2 kg)  09/25/23 226 lb 12.8 oz (102.9 kg)  09/14/23 223 lb 9.6 oz (101.4 kg)     ASSESSMENT & PLAN:   Labile hypertension: Blood pressure is well-controlled in the office today.  Since her last visit, she has noted blood pressure readings and sometimes it in the 1 teens to low 100s mmHg systolic with a rare reading in the 90s mmHg systolic after taking her morning medications.  With this, she feels fatigued and more unsteady.  To compensate for this blood pressure drop, we will reduce morning losartan  to 25 mg with continuation of evening losartan  at 50 mg and continuation of carvedilol  6.25 mg twice daily (palpitation burden much improved on this dose of carvedilol ) along with continuation of furosemide  20 mg twice daily and as needed hydralazine  for systolic blood pressure greater than 160 mmHg.  HFpEF: Euvolemic and well compensated on current dose of furosemide  20 mg twice daily. Defer addition of MRA or SGLT2 inhibitor in the setting of advanced age and concern for off target effect.  Recent labs stable.  Aortic stenosis: Echo in 10/2023 showed mild aortic stenosis.  Continue to monitor with periodic echo.  PAF: Maintaining sinus rhythm by physical exam on carvedilol  6.25 mg twice daily.  CHA2DS2-VASc at least 7 (CHF, HTN,  age x 2, CVA x 2, sex category).  Remains on apixaban  5 mg twice daily and does not meet reduced dosing criteria. No falls or symptoms concerning for bleeding.  Recent labs stable.  History of syncope: No further episodes. Felt to be consistent with vasovagal etiology, though posttermination pauses of up to 3.3 seconds were noted on prior event monitor.  Follow-up with EP as directed.   HLD with statin intolerance: LDL 126 in 09/2022 with normal AST/ALT at that time.  Now on ezetimibe  10 mg.  History of remote CVA and TIA: Prior imaging has demonstrated remote CVA. She was admitted to the hospital in 11/2022 with suspected TIA. No indication for outpatient cardiac monitoring or echo with bubble study as patient is already maintained on OAC for known A-fib.  Carotid artery ultrasound without obstructive disease as outlined below.  Remains on apixaban  in lieu of aspirin .  Carotid artery stenosis: Ultrasound in  10/2023 showed 1 to 39% bilateral ICA stenoses.  On apixaban  in lieu of aspirin  and ezetimibe  10 mg with statin intolerance.     Disposition: F/u with Dr. Mady or an APP in 3 months.   Medication Adjustments/Labs and Tests Ordered: Current medicines are reviewed at length with the patient today.  Concerns regarding medicines are outlined above. Medication changes, Labs and Tests ordered today are summarized above and listed in the Patient Instructions accessible in Encounters.   Signed, Bernardino Bring, PA-C 12/19/2023 12:53 PM     [facility] [place] [address] [place], [place] [address] [phone]     [1]  Current Meds  Medication Sig   acetaminophen  (TYLENOL ) 650 MG CR tablet Take 650 mg by mouth every 8 (eight) hours as needed for pain.   albuterol  (VENTOLIN  HFA) 108 (90 Base) MCG/ACT inhaler Inhale 2 puffs into the lungs every 4 (four) hours as needed.   apixaban  (ELIQUIS ) 5 MG TABS tablet Take 5 mg by mouth 2 (two) times daily.    b complex  vitamins tablet Take 1 tablet by mouth daily.   Bioflavonoid Products (BIOFLEX) TABS Take 1 tablet by mouth in the morning and at bedtime.   carvedilol  (COREG ) 6.25 MG tablet Take 1 tablet (6.25 mg total) by mouth 2 (two) times daily.   Cholecalciferol  (VITAMIN D3) 1000 UNITS CAPS Take 1 capsule by mouth daily.   Coenzyme Q10 (COQ10) 100 MG CAPS Take 1 capsule by mouth daily.   famotidine  (PEPCID ) 20 MG tablet Take 20 mg by mouth daily.   fexofenadine (ALLEGRA) 180 MG tablet Take 180 mg by mouth as needed.   Fluticasone -Umeclidin-Vilant (TRELEGY ELLIPTA ) 100-62.5-25 MCG/ACT AEPB Inhale 1 puff into the lungs daily.   Fluticasone -Umeclidin-Vilant (TRELEGY ELLIPTA ) 100-62.5-25 MCG/ACT AEPB Inhale 1 puff into the lungs daily in the afternoon.   gabapentin  (NEURONTIN ) 600 MG tablet Take 600 mg by mouth 2 (two) times daily.   Ginger , Zingiber officinalis, (GINGER  ROOT PO) Take by mouth daily.   guaiFENesin  (MUCINEX ) 600 MG 12 hr tablet Take 600 mg by mouth daily.   ipratropium-albuterol  (DUONEB) 0.5-2.5 (3) MG/3ML SOLN Take 3 mLs by nebulization every 6 (six) hours as needed.   levothyroxine  (SYNTHROID , LEVOTHROID) 50 MCG tablet Take 1 tablet by mouth daily.   Multiple Vitamins-Minerals (CENTRUM SILVER PO) Take 1 tablet by mouth daily.   Multiple Vitamins-Minerals (PRESERVISION AREDS 2 PO) Take 1 capsule by mouth 2 (two) times daily.    MYRBETRIQ  25 MG TB24 tablet Take 25 mg by mouth daily.   Omega-3 Fatty Acids (FISH OIL) 1000 MG CAPS Take 1 capsule by mouth in the morning and at bedtime.   PREMARIN vaginal cream Place 1 Applicatorful vaginally in the morning and at bedtime.   psyllium (METAMUCIL) 58.6 % powder Take 1 packet by mouth daily.   TURMERIC PO Take by mouth daily.   [DISCONTINUED] ezetimibe  (ZETIA ) 10 MG tablet Take 1 tablet (10 mg total) by mouth daily.   [DISCONTINUED] furosemide  (LASIX ) 40 MG tablet Take 0.5 tablets (20 mg total) by mouth 2 (two) times daily.   [DISCONTINUED]  hydrALAZINE  (APRESOLINE ) 25 MG tablet TAKE 1 TABLET (25 MG)  BY MOUTH UP TO THREE TIMES A DAY AS NEEDED FOR SYSTOLIC BP OF 160 OR GREATER   [DISCONTINUED] losartan  (COZAAR ) 50 MG tablet Take 1 tablet (50 mg total) by mouth 2 (two) times daily.

## 2023-12-19 ENCOUNTER — Encounter: Payer: Self-pay | Admitting: Physician Assistant

## 2023-12-19 ENCOUNTER — Ambulatory Visit: Attending: Physician Assistant | Admitting: Physician Assistant

## 2023-12-19 VITALS — BP 130/70 | HR 62 | Ht 63.0 in | Wt 227.6 lb

## 2023-12-19 DIAGNOSIS — Z8673 Personal history of transient ischemic attack (TIA), and cerebral infarction without residual deficits: Secondary | ICD-10-CM

## 2023-12-19 DIAGNOSIS — I480 Paroxysmal atrial fibrillation: Secondary | ICD-10-CM

## 2023-12-19 DIAGNOSIS — I6523 Occlusion and stenosis of bilateral carotid arteries: Secondary | ICD-10-CM

## 2023-12-19 DIAGNOSIS — R0989 Other specified symptoms and signs involving the circulatory and respiratory systems: Secondary | ICD-10-CM

## 2023-12-19 DIAGNOSIS — I5032 Chronic diastolic (congestive) heart failure: Secondary | ICD-10-CM

## 2023-12-19 DIAGNOSIS — E785 Hyperlipidemia, unspecified: Secondary | ICD-10-CM

## 2023-12-19 DIAGNOSIS — G459 Transient cerebral ischemic attack, unspecified: Secondary | ICD-10-CM

## 2023-12-19 DIAGNOSIS — Z87898 Personal history of other specified conditions: Secondary | ICD-10-CM

## 2023-12-19 DIAGNOSIS — I350 Nonrheumatic aortic (valve) stenosis: Secondary | ICD-10-CM

## 2023-12-19 DIAGNOSIS — Z789 Other specified health status: Secondary | ICD-10-CM

## 2023-12-19 MED ORDER — FUROSEMIDE 40 MG PO TABS: 20.0000 mg | ORAL_TABLET | Freq: Two times a day (BID) | ORAL | 3 refills | Status: AC

## 2023-12-19 MED ORDER — HYDRALAZINE HCL 25 MG PO TABS: ORAL_TABLET | ORAL | 11 refills | Status: AC

## 2023-12-19 MED ORDER — EZETIMIBE 10 MG PO TABS: 10.0000 mg | ORAL_TABLET | Freq: Every day | ORAL | 3 refills | Status: AC

## 2023-12-19 MED ORDER — LOSARTAN POTASSIUM 50 MG PO TABS: ORAL_TABLET | ORAL | 3 refills | Status: AC

## 2023-12-19 NOTE — Patient Instructions (Signed)
 Medication Instructions:  Your physician recommends the following medication changes.  DECREASE: Losartan  to 25 mg (half tablet) in the morning and continue to take a 50 mg tablet in the evening  *If you need a refill on your cardiac medications before your next appointment, please call your pharmacy*  Lab Work: None ordered at this time   Follow-Up: At [facility], you and your health needs are our priority.  As part of our continuing mission to provide you with exceptional heart care, our providers are all part of one team.  This team includes your primary Cardiologist (physician) and Advanced Practice Providers or APPs (Physician Assistants and Nurse Practitioners) who all work together to provide you with the care you need, when you need it.  Your next appointment:   3 month(s)  Provider:   You may see Lonni Hanson, MD or Bernardino Bring, PA-C  We recommend signing up for the patient portal called MyChart.  Sign up information is provided on this After Visit Summary.  MyChart is used to connect with patients for Virtual Visits (Telemedicine).  Patients are able to view lab/test results, encounter notes, upcoming appointments, etc.  Non-urgent messages can be sent to your provider as well.   To learn more about what you can do with MyChart, go to [email or web address].

## 2024-01-30 ENCOUNTER — Telehealth: Payer: Self-pay | Admitting: Internal Medicine

## 2024-01-30 NOTE — Telephone Encounter (Signed)
 Pt c/o BP issue: STAT if pt c/o blurred vision, one-sided weakness or slurred speech.  STAT if BP is GREATER than 180/120 TODAY.  STAT if BP is LESS than 90/60 and SYMPTOMATIC TODAY  1. What is your BP concern? Elevated   2. Have you taken any BP medication today? Yes  3. What are your last 5 BP readings?   124/106 89 119/96 88 96/84 79 97/82 86 157/108   95  4. Are you having any other symptoms (ex. Dizziness, headache, blurred vision, passed out)?  No  Please advise.

## 2024-01-30 NOTE — Telephone Encounter (Signed)
 Called and spoke with daughter - patient is feeling very fatigued and not wanting to eat as she feels she can't make it to dining room and back, denies any other symptoms  She has not taken her lasix  or losartan  today - please advise

## 2024-01-30 NOTE — Telephone Encounter (Signed)
 Blood pressures reviewed and appear reasonable.  Would recommend that she contact her PCP to be seen given concern for vague symptoms without noted symptoms of angina or cardiac decompensation, as fatigue can be quite a few things.

## 2024-03-22 ENCOUNTER — Ambulatory Visit: Admitting: Physician Assistant

## 2024-03-28 ENCOUNTER — Ambulatory Visit: Admitting: Pulmonary Disease
